# Patient Record
Sex: Female | Born: 1945
Health system: Southern US, Community
[De-identification: ages and names within clinical notes are randomized; demographics above are authoritative.]

## PROBLEM LIST (undated history)

## (undated) DIAGNOSIS — L409 Psoriasis, unspecified: Secondary | ICD-10-CM

## (undated) DIAGNOSIS — G2581 Restless legs syndrome: Secondary | ICD-10-CM

## (undated) DIAGNOSIS — M1990 Unspecified osteoarthritis, unspecified site: Secondary | ICD-10-CM

## (undated) DIAGNOSIS — F32A Depression, unspecified: Secondary | ICD-10-CM

## (undated) DIAGNOSIS — F419 Anxiety disorder, unspecified: Secondary | ICD-10-CM

## (undated) DIAGNOSIS — R51 Headache: Secondary | ICD-10-CM

## (undated) DIAGNOSIS — K5909 Other constipation: Secondary | ICD-10-CM

## (undated) DIAGNOSIS — H409 Unspecified glaucoma: Secondary | ICD-10-CM

## (undated) DIAGNOSIS — E785 Hyperlipidemia, unspecified: Secondary | ICD-10-CM

## (undated) DIAGNOSIS — Z5189 Encounter for other specified aftercare: Secondary | ICD-10-CM

## (undated) DIAGNOSIS — M8580 Other specified disorders of bone density and structure, unspecified site: Secondary | ICD-10-CM

## (undated) DIAGNOSIS — K279 Peptic ulcer, site unspecified, unspecified as acute or chronic, without hemorrhage or perforation: Principal | ICD-10-CM

## (undated) DIAGNOSIS — K219 Gastro-esophageal reflux disease without esophagitis: Secondary | ICD-10-CM

## (undated) DIAGNOSIS — M810 Age-related osteoporosis without current pathological fracture: Secondary | ICD-10-CM

## (undated) DIAGNOSIS — D649 Anemia, unspecified: Secondary | ICD-10-CM

## (undated) DIAGNOSIS — T7840XA Allergy, unspecified, initial encounter: Secondary | ICD-10-CM

## (undated) DIAGNOSIS — H269 Unspecified cataract: Secondary | ICD-10-CM

## (undated) DIAGNOSIS — F329 Major depressive disorder, single episode, unspecified: Secondary | ICD-10-CM

## (undated) HISTORY — DX: Other specified disorders of bone density and structure, unspecified site: M85.80

## (undated) HISTORY — DX: Psoriasis, unspecified: L40.9

## (undated) HISTORY — DX: Headache: R51

## (undated) HISTORY — DX: Unspecified glaucoma: H40.9

## (undated) HISTORY — DX: Other constipation: K59.09

## (undated) HISTORY — DX: Major depressive disorder, single episode, unspecified: F32.9

## (undated) HISTORY — DX: Allergy, unspecified, initial encounter: T78.40XA

## (undated) HISTORY — DX: Gastro-esophageal reflux disease without esophagitis: K21.9

## (undated) HISTORY — DX: Unspecified cataract: H26.9

## (undated) HISTORY — PX: EYE SURGERY: SHX253

## (undated) HISTORY — DX: Anxiety disorder, unspecified: F41.9

## (undated) HISTORY — DX: Restless legs syndrome: G25.81

## (undated) HISTORY — DX: Hyperlipidemia, unspecified: E78.5

## (undated) HISTORY — DX: Depression, unspecified: F32.A

## (undated) HISTORY — DX: Unspecified osteoarthritis, unspecified site: M19.90

## (undated) HISTORY — DX: Age-related osteoporosis without current pathological fracture: M81.0

## (undated) HISTORY — DX: Encounter for other specified aftercare: Z51.89

## (undated) HISTORY — PX: ANKLE FRACTURE SURGERY: SHX122

## (undated) HISTORY — PX: JOINT REPLACEMENT: SHX530

## (undated) HISTORY — DX: Anemia, unspecified: D64.9

## (undated) HISTORY — PX: FRACTURE SURGERY: SHX138

## (undated) HISTORY — PX: UPPER GASTROINTESTINAL ENDOSCOPY: SHX188

## (undated) HISTORY — DX: Peptic ulcer, site unspecified, unspecified as acute or chronic, without hemorrhage or perforation: K27.9

---

## 1997-10-30 ENCOUNTER — Other Ambulatory Visit: Admission: RE | Admit: 1997-10-30 | Discharge: 1997-10-30 | Payer: Self-pay | Admitting: Family Medicine

## 1998-03-02 ENCOUNTER — Encounter: Admission: RE | Admit: 1998-03-02 | Discharge: 1998-05-31 | Payer: Self-pay | Admitting: Family Medicine

## 1998-10-18 ENCOUNTER — Other Ambulatory Visit: Admission: RE | Admit: 1998-10-18 | Discharge: 1998-10-18 | Payer: Self-pay | Admitting: Family Medicine

## 1999-05-03 ENCOUNTER — Encounter: Payer: Self-pay | Admitting: Family Medicine

## 1999-05-03 ENCOUNTER — Encounter: Admission: RE | Admit: 1999-05-03 | Discharge: 1999-05-03 | Payer: Self-pay | Admitting: Family Medicine

## 2000-05-04 ENCOUNTER — Encounter: Payer: Self-pay | Admitting: Family Medicine

## 2000-05-04 ENCOUNTER — Encounter: Admission: RE | Admit: 2000-05-04 | Discharge: 2000-05-04 | Payer: Self-pay | Admitting: Family Medicine

## 2001-01-22 ENCOUNTER — Encounter: Payer: Self-pay | Admitting: Family Medicine

## 2001-01-22 ENCOUNTER — Encounter: Admission: RE | Admit: 2001-01-22 | Discharge: 2001-01-22 | Payer: Self-pay | Admitting: Family Medicine

## 2001-05-09 ENCOUNTER — Encounter: Payer: Self-pay | Admitting: Family Medicine

## 2001-05-09 ENCOUNTER — Encounter: Admission: RE | Admit: 2001-05-09 | Discharge: 2001-05-09 | Payer: Self-pay | Admitting: Family Medicine

## 2001-09-18 ENCOUNTER — Encounter: Admission: RE | Admit: 2001-09-18 | Discharge: 2001-09-18 | Payer: Self-pay | Admitting: Family Medicine

## 2001-09-18 ENCOUNTER — Encounter: Payer: Self-pay | Admitting: Family Medicine

## 2002-05-14 ENCOUNTER — Encounter: Admission: RE | Admit: 2002-05-14 | Discharge: 2002-05-14 | Payer: Self-pay | Admitting: Family Medicine

## 2002-05-14 ENCOUNTER — Encounter: Payer: Self-pay | Admitting: Family Medicine

## 2002-06-19 ENCOUNTER — Encounter: Payer: Self-pay | Admitting: Family Medicine

## 2002-06-19 ENCOUNTER — Encounter: Admission: RE | Admit: 2002-06-19 | Discharge: 2002-06-19 | Payer: Self-pay | Admitting: Family Medicine

## 2003-02-09 ENCOUNTER — Ambulatory Visit (HOSPITAL_COMMUNITY): Admission: RE | Admit: 2003-02-09 | Discharge: 2003-02-09 | Payer: Self-pay | Admitting: Gastroenterology

## 2003-02-11 ENCOUNTER — Encounter: Admission: RE | Admit: 2003-02-11 | Discharge: 2003-02-11 | Payer: Self-pay | Admitting: Family Medicine

## 2003-02-11 ENCOUNTER — Encounter: Payer: Self-pay | Admitting: Family Medicine

## 2003-02-11 ENCOUNTER — Encounter: Payer: Self-pay | Admitting: Internal Medicine

## 2003-03-04 ENCOUNTER — Encounter: Payer: Self-pay | Admitting: Family Medicine

## 2003-03-04 ENCOUNTER — Encounter: Admission: RE | Admit: 2003-03-04 | Discharge: 2003-03-04 | Payer: Self-pay | Admitting: Family Medicine

## 2003-05-28 ENCOUNTER — Encounter: Admission: RE | Admit: 2003-05-28 | Discharge: 2003-05-28 | Payer: Self-pay | Admitting: Family Medicine

## 2004-07-22 ENCOUNTER — Encounter: Admission: RE | Admit: 2004-07-22 | Discharge: 2004-07-22 | Payer: Self-pay | Admitting: Family Medicine

## 2005-08-03 ENCOUNTER — Encounter: Admission: RE | Admit: 2005-08-03 | Discharge: 2005-08-03 | Payer: Self-pay | Admitting: Family Medicine

## 2006-01-10 ENCOUNTER — Ambulatory Visit: Payer: Self-pay | Admitting: Family Medicine

## 2006-04-20 ENCOUNTER — Ambulatory Visit: Payer: Self-pay | Admitting: Family Medicine

## 2006-04-20 LAB — CONVERTED CEMR LAB
Cholesterol: 250 mg/dL (ref 0–200)
Hemoglobin: 13.2 g/dL (ref 12.0–15.0)
MCHC: 34.1 g/dL (ref 30.0–36.0)
MCV: 93.5 fL (ref 78.0–100.0)
Platelets: 313 10*3/uL (ref 150–400)
RBC: 4.14 M/uL (ref 3.87–5.11)
TSH: 1.5 microintl units/mL (ref 0.35–5.50)
VLDL: 11 mg/dL (ref 0–40)

## 2006-04-25 ENCOUNTER — Encounter: Admission: RE | Admit: 2006-04-25 | Discharge: 2006-04-25 | Payer: Self-pay | Admitting: Family Medicine

## 2006-07-03 ENCOUNTER — Ambulatory Visit: Payer: Self-pay | Admitting: Internal Medicine

## 2006-08-17 DIAGNOSIS — F419 Anxiety disorder, unspecified: Secondary | ICD-10-CM | POA: Insufficient documentation

## 2006-08-17 DIAGNOSIS — J309 Allergic rhinitis, unspecified: Secondary | ICD-10-CM | POA: Insufficient documentation

## 2006-08-17 DIAGNOSIS — F329 Major depressive disorder, single episode, unspecified: Secondary | ICD-10-CM

## 2006-08-17 DIAGNOSIS — M810 Age-related osteoporosis without current pathological fracture: Secondary | ICD-10-CM

## 2006-08-23 ENCOUNTER — Encounter: Admission: RE | Admit: 2006-08-23 | Discharge: 2006-08-23 | Payer: Self-pay | Admitting: Family Medicine

## 2006-09-03 ENCOUNTER — Ambulatory Visit: Payer: Self-pay | Admitting: Family Medicine

## 2006-09-03 LAB — CONVERTED CEMR LAB
Total CHOL/HDL Ratio: 3.6
Triglycerides: 82 mg/dL (ref 0–149)

## 2006-09-04 ENCOUNTER — Ambulatory Visit: Payer: Self-pay | Admitting: Family Medicine

## 2007-01-11 ENCOUNTER — Telehealth (INDEPENDENT_AMBULATORY_CARE_PROVIDER_SITE_OTHER): Payer: Self-pay | Admitting: *Deleted

## 2007-02-01 ENCOUNTER — Ambulatory Visit: Payer: Self-pay | Admitting: Family Medicine

## 2007-02-01 DIAGNOSIS — K5909 Other constipation: Secondary | ICD-10-CM | POA: Insufficient documentation

## 2007-02-01 LAB — CONVERTED CEMR LAB
ALT: 19 units/L (ref 0–35)
AST: 25 units/L (ref 0–37)
Albumin: 4.1 g/dL (ref 3.5–5.2)
Alkaline Phosphatase: 63 units/L (ref 39–117)
Basophils Absolute: 0.1 10*3/uL (ref 0.0–0.1)
Calcium: 9.9 mg/dL (ref 8.4–10.5)
Chloride: 106 meq/L (ref 96–112)
Eosinophils Absolute: 0.1 10*3/uL (ref 0.0–0.6)
Eosinophils Relative: 3.3 % (ref 0.0–5.0)
GFR calc non Af Amer: 77 mL/min
Glucose, Bld: 97 mg/dL (ref 70–99)
MCV: 92.2 fL (ref 78.0–100.0)
Platelets: 304 10*3/uL (ref 150–400)
RBC: 4.08 M/uL (ref 3.87–5.11)
WBC: 4.3 10*3/uL — ABNORMAL LOW (ref 4.5–10.5)

## 2007-02-04 ENCOUNTER — Encounter: Payer: Self-pay | Admitting: Family Medicine

## 2007-02-04 ENCOUNTER — Encounter (INDEPENDENT_AMBULATORY_CARE_PROVIDER_SITE_OTHER): Payer: Self-pay | Admitting: Family Medicine

## 2007-02-04 ENCOUNTER — Ambulatory Visit: Payer: Self-pay | Admitting: Family Medicine

## 2007-02-25 ENCOUNTER — Telehealth (INDEPENDENT_AMBULATORY_CARE_PROVIDER_SITE_OTHER): Payer: Self-pay | Admitting: *Deleted

## 2007-03-05 ENCOUNTER — Ambulatory Visit: Payer: Self-pay | Admitting: Family Medicine

## 2007-03-10 LAB — CONVERTED CEMR LAB
Basophils Absolute: 0.1 10*3/uL (ref 0.0–0.1)
Eosinophils Absolute: 0.2 10*3/uL (ref 0.0–0.6)
HCT: 36 % (ref 36.0–46.0)
Hemoglobin: 12.6 g/dL (ref 12.0–15.0)
Lymphocytes Relative: 20.7 % (ref 12.0–46.0)
MCHC: 35 g/dL (ref 30.0–36.0)
MCV: 91.9 fL (ref 78.0–100.0)
Monocytes Absolute: 0.6 10*3/uL (ref 0.2–0.7)
Neutrophils Relative %: 62.3 % (ref 43.0–77.0)

## 2007-03-11 ENCOUNTER — Telehealth (INDEPENDENT_AMBULATORY_CARE_PROVIDER_SITE_OTHER): Payer: Self-pay | Admitting: *Deleted

## 2007-03-11 ENCOUNTER — Encounter (INDEPENDENT_AMBULATORY_CARE_PROVIDER_SITE_OTHER): Payer: Self-pay | Admitting: *Deleted

## 2007-04-25 ENCOUNTER — Ambulatory Visit: Payer: Self-pay | Admitting: Family Medicine

## 2007-05-30 ENCOUNTER — Ambulatory Visit: Payer: Self-pay | Admitting: Family Medicine

## 2007-09-11 ENCOUNTER — Encounter: Admission: RE | Admit: 2007-09-11 | Discharge: 2007-09-11 | Payer: Self-pay | Admitting: Internal Medicine

## 2007-09-16 ENCOUNTER — Encounter: Payer: Self-pay | Admitting: Internal Medicine

## 2007-09-16 ENCOUNTER — Encounter (INDEPENDENT_AMBULATORY_CARE_PROVIDER_SITE_OTHER): Payer: Self-pay | Admitting: *Deleted

## 2007-12-06 ENCOUNTER — Ambulatory Visit: Payer: Self-pay | Admitting: Internal Medicine

## 2008-02-26 ENCOUNTER — Ambulatory Visit: Payer: Self-pay | Admitting: Internal Medicine

## 2008-03-31 ENCOUNTER — Other Ambulatory Visit: Admission: RE | Admit: 2008-03-31 | Discharge: 2008-03-31 | Payer: Self-pay | Admitting: Internal Medicine

## 2008-03-31 ENCOUNTER — Ambulatory Visit: Payer: Self-pay | Admitting: Internal Medicine

## 2008-03-31 ENCOUNTER — Encounter: Payer: Self-pay | Admitting: Internal Medicine

## 2008-03-31 LAB — CONVERTED CEMR LAB: Vit D, 1,25-Dihydroxy: 39 (ref 30–89)

## 2008-04-02 ENCOUNTER — Encounter (INDEPENDENT_AMBULATORY_CARE_PROVIDER_SITE_OTHER): Payer: Self-pay | Admitting: *Deleted

## 2008-04-02 ENCOUNTER — Telehealth (INDEPENDENT_AMBULATORY_CARE_PROVIDER_SITE_OTHER): Payer: Self-pay | Admitting: *Deleted

## 2008-04-02 LAB — CONVERTED CEMR LAB
ALT: 18 U/L
AST: 25 U/L
BUN: 11 mg/dL
Basophils Absolute: 0 K/uL
Basophils Relative: 1.2 %
CO2: 30 meq/L
Calcium: 9.5 mg/dL
Chloride: 104 meq/L
Cholesterol: 270 mg/dL
Creatinine, Ser: 0.9 mg/dL
Direct LDL: 161.9 mg/dL
Eosinophils Absolute: 0.2 K/uL
Eosinophils Relative: 4.7 %
GFR calc Af Amer: 81 mL/min
GFR calc non Af Amer: 67 mL/min
Glucose, Bld: 73 mg/dL
HCT: 38.7 %
HDL: 80.3 mg/dL
Hemoglobin: 13.2 g/dL
Lymphocytes Relative: 21.6 %
MCHC: 34.3 g/dL
MCV: 95.6 fL
Monocytes Absolute: 0.5 K/uL
Monocytes Relative: 11.7 %
Neutro Abs: 2.4 K/uL
Neutrophils Relative %: 60.8 %
Platelets: 267 K/uL
Potassium: 3.7 meq/L
RBC: 4.04 M/uL
RDW: 12.6 %
Sodium: 141 meq/L
TSH: 1.85 u[IU]/mL
Total CHOL/HDL Ratio: 3.4
Triglycerides: 63 mg/dL
VLDL: 13 mg/dL
WBC: 4 10*3/microliter — ABNORMAL LOW

## 2008-04-06 ENCOUNTER — Encounter (INDEPENDENT_AMBULATORY_CARE_PROVIDER_SITE_OTHER): Payer: Self-pay | Admitting: *Deleted

## 2008-04-13 ENCOUNTER — Encounter: Payer: Self-pay | Admitting: Internal Medicine

## 2008-04-13 ENCOUNTER — Ambulatory Visit: Payer: Self-pay | Admitting: Internal Medicine

## 2008-04-30 ENCOUNTER — Telehealth (INDEPENDENT_AMBULATORY_CARE_PROVIDER_SITE_OTHER): Payer: Self-pay | Admitting: *Deleted

## 2008-05-20 ENCOUNTER — Encounter: Payer: Self-pay | Admitting: Internal Medicine

## 2008-05-20 ENCOUNTER — Encounter: Admission: RE | Admit: 2008-05-20 | Discharge: 2008-05-20 | Payer: Self-pay | Admitting: Internal Medicine

## 2008-07-16 ENCOUNTER — Ambulatory Visit: Payer: Self-pay | Admitting: Internal Medicine

## 2008-07-16 ENCOUNTER — Encounter: Payer: Self-pay | Admitting: Internal Medicine

## 2008-08-04 ENCOUNTER — Telehealth (INDEPENDENT_AMBULATORY_CARE_PROVIDER_SITE_OTHER): Payer: Self-pay | Admitting: *Deleted

## 2008-09-16 ENCOUNTER — Encounter: Admission: RE | Admit: 2008-09-16 | Discharge: 2008-09-16 | Payer: Self-pay | Admitting: Internal Medicine

## 2009-03-05 ENCOUNTER — Encounter: Payer: Self-pay | Admitting: Internal Medicine

## 2009-04-02 ENCOUNTER — Ambulatory Visit: Payer: Self-pay | Admitting: Family

## 2009-05-28 ENCOUNTER — Encounter: Payer: Self-pay | Admitting: Internal Medicine

## 2009-06-25 ENCOUNTER — Encounter: Payer: Self-pay | Admitting: Family

## 2009-06-28 ENCOUNTER — Ambulatory Visit: Payer: Self-pay | Admitting: Cardiology

## 2009-06-28 ENCOUNTER — Ambulatory Visit: Payer: Self-pay | Admitting: Family

## 2009-06-28 DIAGNOSIS — G43909 Migraine, unspecified, not intractable, without status migrainosus: Secondary | ICD-10-CM

## 2009-06-28 LAB — CONVERTED CEMR LAB: Sed Rate: 12 mm/hr (ref 0–22)

## 2009-06-29 ENCOUNTER — Encounter: Payer: Self-pay | Admitting: Family

## 2009-09-15 ENCOUNTER — Ambulatory Visit: Payer: Self-pay | Admitting: Family Medicine

## 2009-09-22 ENCOUNTER — Telehealth (INDEPENDENT_AMBULATORY_CARE_PROVIDER_SITE_OTHER): Payer: Self-pay | Admitting: *Deleted

## 2009-09-22 ENCOUNTER — Encounter: Admission: RE | Admit: 2009-09-22 | Discharge: 2009-09-22 | Payer: Self-pay | Admitting: Internal Medicine

## 2009-09-22 LAB — HM MAMMOGRAPHY: HM Mammogram: NEGATIVE

## 2009-09-23 ENCOUNTER — Ambulatory Visit: Payer: Self-pay | Admitting: Family Medicine

## 2009-09-24 ENCOUNTER — Telehealth (INDEPENDENT_AMBULATORY_CARE_PROVIDER_SITE_OTHER): Payer: Self-pay | Admitting: *Deleted

## 2009-09-28 ENCOUNTER — Encounter (INDEPENDENT_AMBULATORY_CARE_PROVIDER_SITE_OTHER): Payer: Self-pay | Admitting: *Deleted

## 2009-11-09 ENCOUNTER — Other Ambulatory Visit: Admission: RE | Admit: 2009-11-09 | Discharge: 2009-11-09 | Payer: Self-pay | Admitting: Internal Medicine

## 2009-11-09 ENCOUNTER — Ambulatory Visit: Payer: Self-pay | Admitting: Internal Medicine

## 2009-11-09 DIAGNOSIS — E785 Hyperlipidemia, unspecified: Secondary | ICD-10-CM

## 2009-11-09 LAB — CONVERTED CEMR LAB: Vit D, 25-Hydroxy: 52 ng/mL (ref 30–89)

## 2009-11-11 LAB — CONVERTED CEMR LAB
Basophils Relative: 1.4 % (ref 0.0–3.0)
Calcium: 9.5 mg/dL (ref 8.4–10.5)
Direct LDL: 150.9 mg/dL
Eosinophils Relative: 4.2 % (ref 0.0–5.0)
GFR calc non Af Amer: 84.94 mL/min (ref >60)
Glucose, Bld: 70 mg/dL (ref 70–99)
HCT: 37.6 % (ref 36.0–46.0)
HDL: 70.3 mg/dL (ref >39.00)
Hemoglobin: 12.9 g/dL (ref 12.0–15.0)
Lymphocytes Relative: 25.6 % (ref 12.0–46.0)
Lymphs Abs: 1.1 10*3/uL (ref 0.7–4.0)
Monocytes Relative: 12.5 % — ABNORMAL HIGH (ref 3.0–12.0)
Neutro Abs: 2.5 10*3/uL (ref 1.4–7.7)
Potassium: 4.6 meq/L (ref 3.5–5.1)
RBC: 3.95 M/uL (ref 3.87–5.11)
Sodium: 144 meq/L (ref 135–145)
Total CHOL/HDL Ratio: 4
VLDL: 26.4 mg/dL (ref 0.0–40.0)
WBC: 4.4 10*3/uL — ABNORMAL LOW (ref 4.5–10.5)

## 2009-12-13 ENCOUNTER — Ambulatory Visit: Payer: Self-pay | Admitting: Internal Medicine

## 2009-12-14 ENCOUNTER — Ambulatory Visit: Payer: Self-pay | Admitting: Internal Medicine

## 2009-12-15 ENCOUNTER — Telehealth: Payer: Self-pay | Admitting: Internal Medicine

## 2009-12-17 ENCOUNTER — Telehealth (INDEPENDENT_AMBULATORY_CARE_PROVIDER_SITE_OTHER): Payer: Self-pay | Admitting: *Deleted

## 2009-12-21 ENCOUNTER — Encounter: Payer: Self-pay | Admitting: Internal Medicine

## 2009-12-24 ENCOUNTER — Emergency Department (HOSPITAL_COMMUNITY): Admission: EM | Admit: 2009-12-24 | Discharge: 2009-12-24 | Payer: Self-pay | Admitting: Emergency Medicine

## 2009-12-24 ENCOUNTER — Telehealth: Payer: Self-pay | Admitting: Internal Medicine

## 2010-01-07 ENCOUNTER — Encounter: Payer: Self-pay | Admitting: Internal Medicine

## 2010-01-13 ENCOUNTER — Telehealth: Payer: Self-pay | Admitting: Internal Medicine

## 2010-01-21 ENCOUNTER — Encounter: Payer: Self-pay | Admitting: Internal Medicine

## 2010-01-28 ENCOUNTER — Ambulatory Visit: Payer: Self-pay | Admitting: Internal Medicine

## 2010-04-09 ENCOUNTER — Emergency Department (HOSPITAL_COMMUNITY): Admission: EM | Admit: 2010-04-09 | Discharge: 2010-04-09 | Payer: Self-pay | Admitting: Family Medicine

## 2010-04-11 ENCOUNTER — Encounter: Payer: Self-pay | Admitting: Internal Medicine

## 2010-04-29 ENCOUNTER — Ambulatory Visit: Payer: Self-pay | Admitting: Internal Medicine

## 2010-05-15 DIAGNOSIS — K279 Peptic ulcer, site unspecified, unspecified as acute or chronic, without hemorrhage or perforation: Secondary | ICD-10-CM

## 2010-05-15 HISTORY — DX: Peptic ulcer, site unspecified, unspecified as acute or chronic, without hemorrhage or perforation: K27.9

## 2010-05-20 ENCOUNTER — Encounter
Admission: RE | Admit: 2010-05-20 | Discharge: 2010-06-14 | Payer: Self-pay | Source: Home / Self Care | Attending: Orthopedic Surgery | Admitting: Orthopedic Surgery

## 2010-05-30 ENCOUNTER — Encounter
Admission: RE | Admit: 2010-05-30 | Discharge: 2010-05-30 | Payer: Self-pay | Source: Home / Self Care | Attending: Orthopedic Surgery | Admitting: Orthopedic Surgery

## 2010-06-02 ENCOUNTER — Telehealth: Payer: Self-pay | Admitting: Internal Medicine

## 2010-06-02 ENCOUNTER — Encounter: Payer: Self-pay | Admitting: Internal Medicine

## 2010-06-02 ENCOUNTER — Ambulatory Visit
Admission: RE | Admit: 2010-06-02 | Discharge: 2010-06-02 | Payer: Self-pay | Source: Home / Self Care | Attending: Internal Medicine | Admitting: Internal Medicine

## 2010-06-02 DIAGNOSIS — M1990 Unspecified osteoarthritis, unspecified site: Secondary | ICD-10-CM

## 2010-06-02 HISTORY — DX: Unspecified osteoarthritis, unspecified site: M19.90

## 2010-06-05 ENCOUNTER — Encounter: Payer: Self-pay | Admitting: Family Medicine

## 2010-06-14 NOTE — Letter (Signed)
   [facility] [address] [place], [place] [address] [phone]    June 29, 2009   [facility] [address] [place], [place] [address]  RE:  LAB RESULTS  Dear  Ms. Tsang,  The following is an interpretation of your most recent lab tests.  Please take note of any instructions provided or changes to medications that have resulted from your lab work.     Your sed rate is normal.     Sincerely Yours,    Lemont Fillers FNP

## 2010-06-14 NOTE — Consult Note (Signed)
Summary: [facility]  [facility]   Imported By: Sherian Rein 01/21/2010 07:27:36  _____________________________________________________________________  External Attachment:    Type:   Image     Comment:   External Document

## 2010-06-14 NOTE — Letter (Signed)
Summary: pain from L5 root compression, had a MRI--local shot vs surgery  [facility]   Imported By: Sherian Rein 01/21/2010 07:28:32  _____________________________________________________________________  External Attachment:    Type:   Image     Comment:   External Document

## 2010-06-14 NOTE — Assessment & Plan Note (Signed)
Summary: spartic h/a's//lch   Vital Signs:  Patient profile:   65 year old female Menstrual status:  postmenopausal Weight:      157 pounds Temp:     98.5 degrees F oral Pulse rate:   68 / minute BP sitting:   120 / 76  (left arm)  Vitals Entered By: Doristine Devoid (June 28, 2009 10:59 AM) CC: intermittent HA on and off xmonths now becoming more frequent , Headache Comments -has f/u w/ eye Dr. 06/29/09   CC:  intermittent HA on and off xmonths now becoming more frequent  and Headache.  History of Present Illness: [name] is a 65 year old female ho presents today with c/o HA.  Notes that the pain is sharp.  Generally noted behind her right eye or over her left ear.  Symptoms present on either one side or the other.  Initially she was having HA once a week.  In the last one week she has been having > 1 episode per day.  Notes that HA will last approximately 1 minute, and few minutes later she will have another HA.  Might have 3-4 like this in sequence.  May happen again later in the day.  This is sometimes accompanied by nausea, but no vomitting.  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  She denies associated photophobia or phonophobia.  She has taken ibuprofen for neck pain.  This helps her symptoms of neck pain temporarily but does not help her headache. She does note that she had fall 10/13 and struck head on side walk following a mechanical fall. She did not seek medical attention following this fall.  Patient notes that headaches are brief, but are when they occur, the worst headaches of her life.  Denies history of migraine headaches. Denies associated visual changes.  Denies fever but did have some recent myalgias.    Allergies: 1)  ! Sulfa  Review of Systems       occasional muscle pain, notes mild tenderness overlying temples  Physical Exam  General:  Well-developed,well-nourished,in no acute distress; alert,appropriate and  cooperative throughout examination Head:  Normocephalic and atraumatic without obvious abnormalities. No apparent alopecia or balding. mild tenderness over bilateral temporal region.   Eyes:  PERRLA Neck:  No deformities, masses, or tenderness noted. Lungs:  Normal respiratory effort, chest expands symmetrically. Lungs are clear to auscultation, no crackles or wheezes. Heart:  Normal rate and regular rhythm. S1 and S2 normal without gallop, murmur, click, rub or other extra sounds. Neurologic:  alert & oriented X3, cranial nerves II-XII intact, and strength normal in all extremities.  EOM intact   Impression & Recommendations:  Problem # 1:  HEADACHE (ICD-784.0) Assessment New Given history of fall need to do head CT to rule out intracranial bleed.  Will also check sed rate due to temporal tenderness and recent report of myalgias.   Orders: Venipuncture (32440) TLB-Sedimentation Rate (ESR) (85652-ESR) Misc. Referral (Misc. Ref)  Complete Medication List: 1)  Celexa 20 Mg Tabs (Citalopram hydrobromide) .Marland Kitchen.. 1 by mouth qd 2)  Budeprion Xl 300 Mg Tb24 (Bupropion hcl) .... Take one tablet daily 3)  Zyrtec Hives Relief 10 Mg Tabs (Cetirizine hcl) 4)  Coenzyme Q10 100 Mg Caps (Coenzyme q10) .... Take one tablet daily 5)  B Complex  6)  Preservision Areds Caps (Multiple vitamins-minerals) 7)  Vitamin D-400 400 Unit Tabs (Cholecalciferol) .Marland Kitchen.. 1 tab by mouth daily 8)  Calcium 500 Mg Tabs (Calcium) .Marland KitchenMarland KitchenMarland Kitchen  2 tablets daily  Patient Instructions: 1)  Please complete your lab work and your  CT today. 2)  Please schedule a follow-up appointment in 2 weeks.

## 2010-06-14 NOTE — Assessment & Plan Note (Signed)
Summary: rash - itching/cbs   Vital Signs:  Patient profile:   65 year old female Menstrual status:  postmenopausal Height:      65.5 inches Weight:      157.38 pounds BMI:     25.88 Temp:     98.6 degrees F oral Pulse rate:   60 / minute  Vitals Entered By: Kandice Hams (Sep 15, 2009 11:29 AM) CC: c/o rash red itching both  arms and hands, pulling weeds in yard yesterday, poison oak?   History of Present Illness: 65 yo woman here today for itchy rash.  was weeding the yard on Sun/Mon and now has rash spreading up both forearms.  has hx of severe poison in the past requiring 'a shot and 2 pred packs'.  no fever, chills, people in home w/ similar.  Current Medications (verified): 1)  Celexa 20 Mg Tabs (Citalopram Hydrobromide) .Marland Kitchen.. 1 By Mouth Qd 2)  Budeprion Xl 300 Mg  Tb24 (Bupropion Hcl) .... Take One Tablet Daily 3)  Zyrtec Hives Relief 10 Mg  Tabs (Cetirizine Hcl) 4)  Coenzyme Q10 100 Mg Caps (Coenzyme Q10) .... Take One Tablet Daily 5)  B Complex 6)  Preservision Areds   Caps (Multiple Vitamins-Minerals) 7)  Vitamin D-400 400 Unit Tabs (Cholecalciferol) .Marland Kitchen.. 1 Tab By Mouth Daily 8)  Calcium 500 Mg Tabs (Calcium) .... 2 Tablets Daily 9)  Prednisone (Pak) 10 Mg Tabs (Prednisone) .... Take As Directed  Allergies (verified): 1)  ! Sulfa  Review of Systems      See HPI  Physical Exam  General:  Well-developed,well-nourished,in no acute distress; alert,appropriate and cooperative throughout examination Skin:  erythematous vesicular rash in clusters on forearms bilaterally.  no signs of infxn.   Impression & Recommendations:  Problem # 1:  RASH AND OTHER NONSPECIFIC SKIN ERUPTION (ICD-782.1) Assessment New pt reports hx of similar contact dermatitis.  steroid injxn given and script for predpack.  reviewed supportive care and red flags that should prompt return.  Pt expresses understanding and is in agreement w/ this plan. Orders: Depo- Medrol 80mg  (J1040) Admin of  Therapeutic Inj  intramuscular or subcutaneous (04540) Prescription Created Electronically [phone])  Complete Medication List: 1)  Celexa 20 Mg Tabs (Citalopram hydrobromide) .Marland Kitchen.. 1 by mouth qd 2)  Budeprion Xl 300 Mg Tb24 (Bupropion hcl) .... Take one tablet daily 3)  Zyrtec Hives Relief 10 Mg Tabs (Cetirizine hcl) 4)  Coenzyme Q10 100 Mg Caps (Coenzyme q10) .... Take one tablet daily 5)  B Complex  6)  Preservision Areds Caps (Multiple vitamins-minerals) 7)  Vitamin D-400 400 Unit Tabs (Cholecalciferol) .Marland Kitchen.. 1 tab by mouth daily 8)  Calcium 500 Mg Tabs (Calcium) .... 2 tablets daily 9)  Prednisone (pak) 10 Mg Tabs (Prednisone) .... Take as directed  Patient Instructions: 1)  Start the PredPack tomorrow 2)  Use Benadryl cream or Gold-Bond anti-itch creams as needed 3)  Oral Benadryl as needed for itching 4)  Wash all things that have come in contact with the plant oil 5)  Hang in there!!! Prescriptions: PREDNISONE (PAK) 10 MG TABS (PREDNISONE) take as directed  #1 pack x 0   Entered and Authorized by:   Neena Rhymes MD   Signed by:   Neena Rhymes MD on 09/15/2009   Method used:   Electronically to        [facility] [phone]* (retail)       [address]       [place], [place]  [address]       Ph: [phone]       Fax: [phone]   RxID:   [id number]    Medication Administration  Injection # 1:    Medication: Depo- Medrol 80mg     Diagnosis: RASH AND OTHER NONSPECIFIC SKIN ERUPTION (ICD-782.1)    Route: IM    Site: LUOQ gluteus    Exp Date: 03/22/2010    Lot #: [id number]    Mfr: Pharmacia    Patient tolerated injection without complications    Given by: Kandice Hams (Sep 15, 2009 12:03 PM)  Orders Added: 1)  Depo- Medrol 80mg  [J1040] 2)  Admin of Therapeutic Inj  intramuscular or subcutaneous [96372] 3)  Est. Patient Level III [03474] 4)  Prescription Created Electronically [phone]

## 2010-06-14 NOTE — Letter (Signed)
Summary: Results Follow up Letter  [facility]  [address] [place], [place] [address]   Phone: [phone]  Fax: [phone]    04/06/2008 MRN: [id number]  [facility] [address] [place], [place]  [address]  Dear Ms. Woehl,  The following are the results of your recent test(s):  Test         Result    Pap Smear:        Normal __x___  Not Normal _____ Comments: ___________next pap due in 1 year __________________________________________ Cholesterol: LDL(Bad cholesterol):         Your goal is less than:         HDL (Good cholesterol):       Your goal is more than: Comments:  ______________________________________________________ Mammogram:        Normal _____  Not Normal _____ Comments:  ___________________________________________________________________ Hemoccult:        Normal _____  Not normal _______ Comments:    _____________________________________________________________________ Other Tests:    We routinely do not discuss normal results over the telephone.  If you desire a copy of the results, or you have any questions about this information we can discuss them at your next office visit.   Sincerely,

## 2010-06-14 NOTE — Assessment & Plan Note (Signed)
Summary: CPX, WANTS PAP, FASTING, MEDICARE & BCBS/RH......   Vital Signs:  Patient profile:   65 year old female Menstrual status:  postmenopausal Height:      66 inches Weight:      156.13 pounds Temp:     98.7 degrees F oral Pulse rate:   76 / minute Pulse rhythm:   regular BP sitting:   118 / 78  (left arm) Cuff size:   regular  Vitals Entered By: Army Fossa CMA (November 09, 2009 9:19 AM) CC: CPX: Fasting, pap Comments - Having Headaches in the am - Had poison oak still has spots that are broken out. -Cluster of bumps under her left breast. -Seeing a therapist.    History of Present Illness:  Here for Medicare AWV:  1.Risk factors based on Past M, S, F history: reviewed 2.Physical Activities: inactive  3.Depression/mood:  see below, seing a therapist  4.Hearing: no problems , normal hearing to whispered voice  5.ADL's: totally independent 6.Fall Risk: low risk, no recent falls 7.Home Safety: has Conservation officer, nature, home is safe  8.Height, weight, &visual acuity:visiuon corrected, see VS. Early glaucoma? 9. Counseling:  yes, regards life style and depression  10. Labs ordered based on risk factors:  yes 11. Referral Coordination --yes 12. Care Plan-- discussed w/ patient plan ofcare for the next 12 months  13.    Cognitive Assessment-- normal memory short and long,   in addition to her physical we assessed the following problems -- still having Headaches in the am: symptoms are almost daily, sometimes associated with nausea, resolve spontaneously, they are usually one-sided (L>R) no photophobia - Had poison oak still has spots that are broken out.she has gotten prednisone twice, pulsatile symptoms better but he still has some spots. -Cluster of bumps under her left breast for several months, no itching, occasionally hurt - depression, sx not completely well, seeing a therapist, started w/ him 3-11, improved? -Osteoporosis, bone density test reviewed and discussed with the  patient    Allergies: 1)  ! Sulfa  Past History:  Past Medical History: Allergic rhinitis Depression Osteoporosis Constipation, chronic HAs chest x-ray 2008 showed  "emphysema", PFTs 2009 normal  Past Surgical History: Reviewed history from 08/17/2006 and no changes required. Caesarean section  Family History: Reviewed history from 12/06/2007 and no changes required. CAD - no stroke - M brain aneurysm - M DM - GF HTN - no colon Ca - no breast Ca - no ovarian/uterine Ca - no F family hx unknown  Social History: Married, household patient and her husband  1 child 2 step children 7 grandkids retired VP of  [employer]-- never ETOH-- socially   Review of Systems CV:  Denies chest pain or discomfort and swelling of feet. Resp:  Denies cough and shortness of breath. GI:  Denies bloody stools, diarrhea, nausea, and vomiting; (+) chronic constipation. GU:  Denies dysuria and hematuria; no  SBE some stress incontinence . Psych:  (-) suicidal sleeps well .  Physical Exam  General:  alert, well-developed, and well-nourished.   Neck:  no masses, no thyromegaly, and normal carotid upstroke.   Breasts:  No mass, nodules, thickening, tenderness, bulging, retraction, inflamation, nipple discharge or skin changes noted.  no axillary lymphadenopathy  Lungs:  normal respiratory effort, no intercostal retractions, no accessory muscle use, and normal breath sounds.   Heart:  normal rate, regular rhythm, no murmur, and no gallop.   Abdomen:  soft, non-tender, no distention, no masses, no guarding, and no  rigidity.   Genitalia:  no external lesions.   (+) vaginal dryness and atresia  very hard to examined the vaginal, cervix poorly visualized but no lesions or discharge Bimanual exam limited, adnexa without mass Neurologic:  alert & oriented X3, strength normal in all extremities, and gait normal.   Skin:  few red, blistery spots in the arms ( what  patient thinks is  poison ivy) under the left breast there is 1/2 cm lesion, slightly dark and  scaly Psych:  Oriented X3, memory intact for recent and remote, normally interactive, good eye contact, not anxious appearing, and not depressed appearing.  slightly flat affect   Impression & Recommendations:  Problem # 1:  HEALTH SCREENING (ICD-V70.0)  Td 2001 and today shingles shot 2009 pneumonia shot-- today  Colonoscopy: 05/15/2002, Next Due:  05/2012 (per chart review, no scope report found; patient told Cscope  was noraml)   PAP 11-09 neg  , repeated Pap today MMG 5-11 (-)  , breast exam today  she definitely has vaginal dryness, she's not sexually active, drynes  does not bother her. Some urinary incontinence with cough. Discuss with her possible use of Premarin cream, we agree to try a low dose, aware needs yearly pap and MMGs  Orders: First annual wellness visit with prevention plan  (Z6109) Pelvic & Breast Exam ( Medicare)  (G0101)  Problem # 2:  OSTEOPOROSIS (ICD-733.00) last bone density test 3/10  compared to the scanned  bone density test from 2004: lumbar  T score has definitely  improved hips Tscore has improved slightly base on results from 3-10,she stoped biphosphonates (holyday) continue ca and vit d    Her updated medication list for this problem includes:    Vitamin D-400 400 Unit Tabs (Cholecalciferol) .Marland Kitchen... 1 tab by mouth daily    Calcium 500 Mg Tabs (Calcium) .Marland Kitchen... 2 tablets daily  Orders: TLB-TSH (Thyroid Stimulating Hormone) (84443-TSH) T-Vitamin D (25-Hydroxy) [id number])  Problem # 3:  RASH AND OTHER NONSPECIFIC SKIN ERUPTION (ICD-782.1) she has come back dermatitis, this is gone on since May 2011, status post 2 rounds of steroids by mouth. She still has some fresh lesions Plan: hydrocortisone cream 2.5% Avoidance discussed    Her updated medication list for this problem includes:    Hydrocortisone 2.5 % Crea (Hydrocortisone) .Marland Kitchen... Apply two times a day x 10  days  Problem # 4:  HEADACHE (ICD-784.0)  sounds like a migraine headache symptoms are daily -- refer to neuro  Orders: Neurology Referral (Neuro)  Problem # 5:  DEPRESSION (ICD-311) note well controlled on Celexa since 2008 On  bupropion for even longer  Plan: will RTC in 3 months, if no better consider change meds  continue seing therapist  Her updated medication list for this problem includes:    Celexa 20 Mg Tabs (Citalopram hydrobromide) .Marland Kitchen... 1 by mouth qd    Budeprion Xl 300 Mg Tb24 (Bupropion hcl) .Marland Kitchen... Take one tablet daily  Problem # 6:  ? of SEBORRHEIC KERATOSIS (ICD-702.19)  Lesion under the left breast likely seborrheic keratosis Recommend  derm  referral for excision, area  is irritated by the bra also has a lesion in the R arm Refer to derm   Orders: Dermatology Referral (Derma)  Problem # 7:  HYPERLIPIDEMIA (ICD-272.4) moderate hyperlipidemia, diet and exercise discussed Orders: Venipuncture (91478) TLB-BMP (Basic Metabolic Panel-BMET) (80048-METABOL) TLB-CBC Platelet - w/Differential (85025-CBCD) TLB-Lipid Panel (80061-LIPID)  Labs Reviewed: SGOT: 25 (03/31/2008)   SGPT: 18 (03/31/2008)   HDL:80.3 (03/31/2008), 64.6 (09/03/2006)  LDL:DEL (03/31/2008), DEL (09/03/2006)  Chol:270 (03/31/2008), 233 (09/03/2006)  Trig:63 (03/31/2008), 82 (09/03/2006)  Complete Medication List: 1)  Celexa 20 Mg Tabs (Citalopram hydrobromide) .Marland Kitchen.. 1 by mouth qd 2)  Budeprion Xl 300 Mg Tb24 (Bupropion hcl) .... Take one tablet daily 3)  Zyrtec Hives Relief 10 Mg Tabs (Cetirizine hcl) 4)  B Complex  5)  Preservision Areds Caps (Multiple vitamins-minerals) 6)  Vitamin D-400 400 Unit Tabs (Cholecalciferol) .Marland Kitchen.. 1 tab by mouth daily 7)  Calcium 500 Mg Tabs (Calcium) .... 2 tablets daily 8)  Fish Oil 1000 Mg Caps (Omega-3 fatty acids) .... 2 caps daily 9)  Hydrocortisone 2.5 % Crea (Hydrocortisone) .... Apply two times a day x 10 days 10)  Premarin 0.625 Mg/gm Crea  (Estrogens, conjugated) .... Apply 3 times a week  Other Orders: Prescription Created Electronically [phone]) Tdap => 56yrs IM (76283) Admin 1st Vaccine (15176) Pneumococcal Vaccine (16073) Admin of Any Addtl Vaccine (71062)  Patient Instructions: 1)  Please schedule a follow-up appointment in  3  months .  Prescriptions: BUDEPRION XL 300 MG  TB24 (BUPROPION HCL) Take one tablet daily  #90 x 1   Entered and Authorized by:   Elita Quick E. Rochester Serpe MD   Signed by:   Nolon Rod. Nevena Rozenberg MD on 11/09/2009   Method used:   Electronically to        [facility] [phone]* (retail)       [address]       [place], [place]  [address]       Ph: [phone]       Fax: [phone]   RxID:   [id number] CELEXA 20 MG TABS (CITALOPRAM HYDROBROMIDE) 1 by mouth qd  #90 x 1   Entered and Authorized by:   Nolon Rod. Dorlis Judice MD   Signed by:   Nolon Rod. Collyn Selk MD on 11/09/2009   Method used:   Electronically to        [facility] [phone]* (retail)       [address]       [place], [place]  [address]       Ph: [phone]       Fax: [phone]   RxID:   [id number] PREMARIN 0.625 MG/GM CREA (ESTROGENS, CONJUGATED) apply 3 times a week  #1 x 6   Entered and Authorized by:   Nolon Rod. Pamla Pangle MD   Signed by:   Nolon Rod. Daleena Rotter MD on 11/09/2009   Method used:   Electronically to        [facility] [phone]* (retail)       [address]       [place], [place]  [address]       Ph: [phone]       Fax: [phone]   RxID:   [id number] HYDROCORTISONE 2.5 % CREA (HYDROCORTISONE) apply two times a day x 10 days  #1 x 0   Entered and Authorized by:   Nolon Rod. Megha Agnes MD   Signed by:   Nolon Rod. Liann Spaeth MD on 11/09/2009   Method used:   Electronically to        [facility] [phone]* (retail)       [address]       [place], [place]  [address]       Ph: [phone]       Fax: [phone]   RxID:   [id number]       Immunizations Administered:  Tetanus  Vaccine:    Vaccine Type: Tdap    Site: left deltoid    Mfr:  GlaxoSmithKline    Dose: 0.5 ml    Route: IM    Given by: Army Fossa CMA    Exp. Date: 08/07/2011    Lot #: [id number]  Pneumonia Vaccine:    Vaccine Type: Pneumovax    Site: right deltoid    Mfr: Merck    Dose: 0.5 ml    Route: IM    Given by: Army Fossa CMA    Exp. Date: 06/01/2011    Lot #: [id number]      Appended Document: CPX, WANTS PAP, FASTING, MEDICARE & BCBS/RH...... ADDENDUM:  PELVIC EXAM: external genitalia --no lesion , hair distribution wnl  Urethral meatus -- normal size & location,no lesions  Urethra --no massesor  tenderness, or scarring   Bladder no apparent  masses, or tenderness   Vagina--slightly  appearance,  no discharge lesions   Cervix --poorly visualized but no discharge or lesions    Uterus (i.e., size, contour, position, mobility, tenderness, consistency, descent, or support)  Adnexa/parametria -- limited  exam but no no masses or  tenderness   Anus and perineum-- normal to inspection

## 2010-06-14 NOTE — Assessment & Plan Note (Addendum)
Summary: hip pain/cbs   Vital Signs:  Patient profile:   65 year old female Menstrual status:  postmenopausal Weight:      158 pounds Pulse rate:   83 / minute Pulse rhythm:   regular BP sitting:   122 / 70  (left arm) Cuff size:   regular  Vitals Entered By: Army Fossa CMA (December 13, 2009 11:44 AM) CC: Pt here c/o Left hip pain Comments Pain started in June after a lot of walking in [place] and doing college tours with Grandkids.  Getting worse.   History of Present Illness: did a lot of walking while visiting in [place] in June, afterwards she developed an anterior left hip pain The pain was on and off but somehow improving last week, she had a granddaughter visiting and they did a lot of walking visiting several college campus. Since then the pain is a lot worse, located not only at the anterior left hip but also  in the left buttock, some radiation to the inner aspect of the left thigh. No back pain per se. pain is worse when standing a sitting ; as she starts  walking, the pain decreased advil  helped a little tylenol  did not help  ROS No rash No lower extremity edema No dysuria, gross hematuria or abdominal pain  Current Medications (verified): 1)  Celexa 20 Mg Tabs (Citalopram Hydrobromide) .Marland Kitchen.. 1 By Mouth Qd 2)  Budeprion Xl 300 Mg  Tb24 (Bupropion Hcl) .... Take One Tablet Daily 3)  Zyrtec Hives Relief 10 Mg  Tabs (Cetirizine Hcl) 4)  B Complex 5)  Preservision Areds   Caps (Multiple Vitamins-Minerals) 6)  Vitamin D-400 400 Unit Tabs (Cholecalciferol) .Marland Kitchen.. 1 Tab By Mouth Daily 7)  Calcium 500 Mg Tabs (Calcium) .... 2 Tablets Daily 8)  Fish Oil 1000 Mg Caps (Omega-3 Fatty Acids) .... 2 Caps Daily 9)  Premarin 0.625 Mg/gm Crea (Estrogens, Conjugated) .... Apply 3 Times A Week  Allergies: 1)  ! Sulfa  Past History:  Past Medical History: Reviewed history from 11/09/2009 and no changes required. 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 Caesarean section  Social History: Reviewed history from 11/09/2009 and no changes required. 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   Abdomen:  soft, non-tender, no distention, and no masses.   Msk:  rotation of the right hip normal Rotation of the left hip elicits some pain Nontender on either trochanteric bursa slightly tender at the left sacroiliac area Pulses:  normal femur pulses Extremities:  no lower extremity edema, counts symmetric and nontender Neurologic:  lower extremity DTRs and motor normal   Impression & Recommendations:  Problem # 1:  HIP PAIN (ICD-719.45) given the history, likely a combination of OA  and overuse Plan: X-ray Prednisone for a few days, side effects discussed Advil over-the-counter as needed, see instructions    Orders: T-Hip Comp Left Min 2-views (73510TC)  Complete Medication List: 1)  Celexa 20 Mg Tabs (Citalopram hydrobromide) .Marland Kitchen.. 1 by mouth qd 2)  Budeprion Xl 300 Mg Tb24 (Bupropion hcl) .... Take one tablet daily 3)  Zyrtec Hives Relief 10 Mg Tabs (Cetirizine hcl) 4)  B Complex  5)  Preservision Areds Caps (Multiple  vitamins-minerals) 6)  Vitamin D-400 400 Unit Tabs (Cholecalciferol) .Marland Kitchen.. 1 tab by mouth daily 7)  Calcium 500 Mg Tabs (Calcium) .... 2 tablets daily 8)  Fish Oil 1000 Mg Caps (Omega-3 fatty acids) .... 2 caps daily 9)  Premarin 0.625 Mg/gm Crea (Estrogens, conjugated) .... Apply 3 times a week 10)  Prednisone 10 Mg Tabs (Prednisone) .... 5 by mouth once daily x 2 , 4x2 , 3x2, 3x2,1x2  Patient Instructions: 1)  x-ray 2)  Prednisone as prescribed, may cause insomnia 3)  Take 400  mg of Ibuprofen (Advil, Motrin)  with food every 6 hours as needed  for relief of pain; watch for stomach irritation 4)  Call if not better in 2 weeks. Call any time if symptoms increase Prescriptions: PREDNISONE 10 MG TABS (PREDNISONE) 5 by mouth once daily x 2 , 4x2 , 3x2, 3x2,1x2  #32 x 0   Entered and Authorized by:   Elita Quick E. Kingson Lohmeyer MD   Signed by:   Nolon Rod. Gyanna Jarema MD on 12/13/2009   Method used:   Electronically to        [facility] [phone]* (retail)       [address]       [place], [place]  [address]       Ph: [phone]       Fax: [phone]   RxID:   [id number]

## 2010-06-14 NOTE — Progress Notes (Signed)
Summary: Hip Pain  Phone Note Call from Patient Call back at Home Phone [phone]   Caller: Patient Reason for Call: Acute Illness Summary of Call: Patient called and LM on triage VM stating that she had an xray done yesterday and had horrible hip pain during the night that kept her awake. She found some out of date darvocet and took one and was able to sleep. This morning she is still in pain and would like to something to take. She is taking the prednisone as you ordered. Please adivise.  Initial call taken by: Harold Barban,  December 15, 2009 11:08 AM  Follow-up for Phone Call        advise patient X-ray negative Continue with prednisone as prescribed DCN is off the market Trial with Vicodin 5 mg one every 4 hours as needed for pain, #40, no refills. Watch for drowsiness Follow-up by: Nolon Rod. Pietra Zuluaga MD,  December 15, 2009 1:10 PM  Additional Follow-up for Phone Call Additional follow up Details #1::        I spoke with pt and informed her of everything. Told her in the pain did not get better then to let us know. Army Fossa CMA  December 15, 2009 1:28 PM     New/Updated Medications: VICODIN 5-500 MG TABS (HYDROCODONE-ACETAMINOPHEN) one every 4 hours as needed for pain, #40 Prescriptions: VICODIN 5-500 MG TABS (HYDROCODONE-ACETAMINOPHEN) one every 4 hours as needed for pain, #40  #40 x 0   Entered by:   Army Fossa CMA   Authorized by:   Nolon Rod. Yates Weisgerber MD   Signed by:   Army Fossa CMA on 12/15/2009   Method used:   Printed then faxed to ...       [facility]  [address] [phone]* (retail)       [address]       [place], [place]  [address]       Ph: [phone]       Fax: [phone]   RxID:   [id number]

## 2010-06-14 NOTE — Progress Notes (Signed)
Summary: Leg Pain  Phone Note Call from Patient Call back at Home Phone [phone]   Caller: Patient Summary of Call: Patient called and LM on traige VM stating that Dr. Drue Novel had been treating her hip pain and she now has pain in her whole leg including the calf along with some toe numbness. She wants to know if she should just continue with the pain meds and prednisone or something else? Please adivse.  Initial call taken by: Harold Barban,  December 17, 2009 9:41 AM  Follow-up for Phone Call        please arrange a  orthopedic referral for next week ( or go to the orthopedic urgent care -[facility]- tomorrow) If she has severe pain, leg swelling, change in in the color of her leg: go to the  ER or urgent care Follow-up by: [facility] E. Paz MD,  December 17, 2009 4:42 PM  Additional Follow-up for Phone Call Additional follow up Details #1::        Patient is aware and will watch her leg and would like the ortho referral.  Additional Follow-up by: Harold Barban,  December 17, 2009 4:44 PM

## 2010-06-14 NOTE — Assessment & Plan Note (Signed)
Summary: follow up /drb   Vital Signs:  Patient profile:   65 year old female Menstrual status:  postmenopausal Weight:      159 pounds Pulse rate:   89 / minute Pulse rhythm:   regular BP sitting:   124 / 84  (left arm) Cuff size:   regular  Vitals Entered By: Army Fossa CMA (January 28, 2010 10:08 AM) CC: Follow up, not fasting Comments Still having severe back pain [facility] [address] flu shot   History of Present Illness: followup from last office visit She saw orthopedic surgery, she had an MRI, pain was determined to be a  L5 problem. She had a local injection which so far has not help much She has a brother who is a Land,  he  rec. to use a back brace ; pt will  see a local chiropractor soon. Taking Advil twice a day, tolerating well. Taking Vicodin only at bedtime because she gets to sleep he  Additionally, she nearly  fainted and had a single episode of chest pain in the setting of severe back pain. She went to the ER 8-12 -2011 ER chart reviewed  EKG is normal and at baseline CKs were negative D-dimer negative  ROS No further episode of chest pain No shortness of breath No lower  extremity edema  Current Medications (verified): 1)  Celexa 20 Mg Tabs (Citalopram Hydrobromide) .Marland Kitchen.. 1 By Mouth Qd 2)  Budeprion Xl 300 Mg  Tb24 (Bupropion Hcl) .... Take One Tablet Daily 3)  Zyrtec Hives Relief 10 Mg  Tabs (Cetirizine Hcl) 4)  B Complex 5)  Preservision Areds   Caps (Multiple Vitamins-Minerals) 6)  Vitamin D-400 400 Unit Tabs (Cholecalciferol) .Marland Kitchen.. 1 Tab By Mouth Daily 7)  Calcium 500 Mg Tabs (Calcium) .... 2 Tablets Daily 8)  Fish Oil 1000 Mg Caps (Omega-3 Fatty Acids) .... 2 Caps Daily 9)  Premarin 0.625 Mg/gm Crea (Estrogens, Conjugated) .... Apply 3 Times A Week 10)  Vicodin 5-500 Mg Tabs (Hydrocodone-Acetaminophen) .... One Every 4 Hours As Needed For Pain, #40  Allergies (verified): 1)  ! Sulfa  Past History:  Past Medical  History: Reviewed history from 11/09/2009 and no changes required. 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 Caesarean section  Social History: Reviewed history from 11/09/2009 and no changes required. 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   Lungs:  normal respiratory effort, no intercostal retractions, no accessory muscle use, and normal breath sounds.   Heart:  normal rate, regular rhythm, no murmur, and no gallop.   Extremities:  no edema Neurologic:  woke with mild  difficulty due to pain. Lower extremity motor strength symmetric DTRs symmetric except for a slightly decreased left ankle jerk   Impression & Recommendations:  Problem # 1:  LUMBAR RADICULOPATHY (ICD-724.4) after she saw orthopedic surgery, she was diagnosed with a left L4-L5 radiculopathy Status post a steroid injection No improving much Plan: recommend patient to continue taking Vicodin or Flexeril at bedtime We'll continue with ibuprofen twice a day, declined to try meloxicam She plans to see a local chiropractor, recommend avoid extensive manipulation I also recommend her to call  orthopedic surgery if the symptoms continue, she was told she may need surgery.  Her updated medication list for this problem includes:    Vicodin 5-500 Mg Tabs (Hydrocodone-acetaminophen) ..... One every 4 hours as needed for pain, #40    Cyclobenzaprine Hcl 10 Mg Tabs (Cyclobenzaprine hcl) .Marland Kitchen... 1 by mouth at bedtime as needed pain  Problem # 2:  CHEST PAIN (ICD-786.50) single episode of chest pain, she went to the ER, was recommended to have outpatient stress  test Patient quite reluctant to proceed with that We agree that she will call if anything changes  Medications Added to Medication List This Visit: 1)  Cyclobenzaprine Hcl 10 Mg Tabs (Cyclobenzaprine hcl) .Marland Kitchen.. 1 by mouth at bedtime as needed pain  Complete Medication List: 1)  Celexa 20 Mg Tabs (Citalopram hydrobromide) .Marland Kitchen.. 1 by mouth qd 2)  Budeprion Xl 300 Mg Tb24 (Bupropion hcl) .... Take one tablet daily 3)  Zyrtec Hives Relief 10 Mg Tabs (Cetirizine hcl) 4)  B Complex  5)  Preservision Areds Caps (Multiple vitamins-minerals) 6)  Vitamin D-400 400 Unit Tabs (Cholecalciferol) .Marland Kitchen.. 1 tab by mouth daily 7)  Calcium 500 Mg Tabs (Calcium) .... 2 tablets daily 8)  Fish Oil 1000 Mg Caps (Omega-3 fatty acids) .... 2 caps daily 9)  Premarin 0.625 Mg/gm Crea (Estrogens, conjugated) .... Apply 3 times a week 10)  Vicodin 5-500 Mg Tabs (Hydrocodone-acetaminophen) .... One every 4 hours as needed for pain, #40 11)  Cyclobenzaprine Hcl 10 Mg Tabs (Cyclobenzaprine hcl) .Marland Kitchen.. 1 by mouth at bedtime as needed pain  Other Orders: Flu Vaccine 7yrs + MEDICARE PATIENTS (J8119) Administration Flu vaccine - MCR (J4782) Flu Vaccine Consent Questions     Do you have a history of severe allergic reactions to this vaccine? no    Any prior history of allergic reactions to egg and/or gelatin? no    Do you have a sensitivity to the preservative Thimersol? no    Do you have a past history of Guillan-Barre Syndrome? no    Do you currently have an acute febrile illness? no    Have you ever had a severe reaction to latex? no    Vaccine information given and explained to patient? yes    Are you currently pregnant? no    Lot Number:[id number]   Exp Date:11/12/2010   Site Given  Left Deltoid IM  Patient Instructions: 1)  use cyclobenzaprine or vicodin , see which one helps better 2)  Please schedule a follow-up appointment in 3 months .  Prescriptions: CYCLOBENZAPRINE HCL 10 MG TABS (CYCLOBENZAPRINE HCL) 1 by  mouth at bedtime as needed pain  #21 x 0   Entered and Authorized by:   Nolon Rod. Paz MD   Signed by:   Nolon Rod. Paz MD on 01/28/2010   Method used:   Electronically to        [facility] [phone]* (retail)       [address]       [place], [place]  [address]       Ph: [phone]       Fax: [phone]   RxID:   [id number]    .lbmedflu

## 2010-06-14 NOTE — Medication Information (Signed)
Summary: Letter Regarding Budeprion/[employer]  Letter Regarding Budeprion/[employer]   Imported By: Lanelle Bal 07/05/2009 11:26:29  _____________________________________________________________________  External Attachment:    Type:   Image     Comment:   External Document

## 2010-06-14 NOTE — Progress Notes (Signed)
Summary: Referral/shots  Phone Note Call from Patient Call back at Home Phone [phone]   Summary of Call: Pt called and states she has been trying for a week to get in with Dr.Ramos to get the shots per the orders of Dr.Gioffre. Pt is unable to schedule an appt she would like to know if we can put in a referall and make the appt for her. Please advise. Army Fossa CMA  January 13, 2010 9:58    Follow-up for Phone Call        please do Follow-up by: [facility] E. Turki Tapanes MD,  January 13, 2010 12:02 PM  Additional Follow-up for Phone Call Additional follow up Details #1::        REFERRAL/ORDER FAXED TO [facility], THEY CONTACT THE PATIENT. Additional Follow-up by: Magdalen Spatz [facility],  January 13, 2010 2:59 PM

## 2010-06-14 NOTE — Progress Notes (Signed)
Summary: near syncope, advised ER -FYI  Phone Note Call from Patient   Caller: Patient Call For: [phone] Summary of Call: Received voice message from pt. stating she had a couple episodes this a.m. of feeling faint, weak, s.o.b and diaphoresis. Pt states she was recently put on Vicodin by the orthopedic Dr. for herniated disc.  Wanted to know if this could be coming from the medication. Advised pt she needed to go to the ER for evaluation. Pt voices understanding. Nicki Guadalajara Fergerson CMA Duncan Dull)  December 24, 2009 10:58 AM   Follow-up for Phone Call        please check on her Monday [place] E. Paz MD  December 26, 2009 10:13 AM   Additional Follow-up for Phone Call Additional follow up Details #1::        Left message for pt to call back. Army Fossa CMA  December 27, 2009 12:50 PM     Additional Follow-up for Phone Call Additional follow up Details #2::    I spoke with pt and she states that she has numbness in her leg- her ortho is ordering an MRI for this. At the ER they asked her to get a stress test. She declined to get this due to whats going on with her leg. She feels that all of this is coming from the Prednisone. The vicodin is not touching the hip pain, she is using Advil. The numbness in her leg and foot is whats bothering her most. She is waiting for [facility] to contact her to schedule the MRI. Army Fossa CMA  December 28, 2009 9:11 AM  Noted. she should have office visit with me next month. advised patient to call us sooner if she needs help so we can give her a sooner appointment Jose E. Paz MD  December 28, 2009 2:17 PM   Additional Follow-up for Phone Call Additional follow up Details #3:: Details for Additional Follow-up Action Taken: Pt is aware, appt scheduled. Army Fossa CMA  December 28, 2009 2:59 PM

## 2010-06-14 NOTE — Letter (Signed)
Summary: Primary Care Appointment Letter  [facility] [place]  [address] [place], [place] [address]   Phone: [phone]  Fax: [phone]    09/28/2009 MRN: [id number]  [facility] [address] [place], [place]  [address]  Dear Ms. [name],   Your Primary Care Physician [facility] E. Paz MD has indicated that:    _______it is time to schedule an appointment.    _______you missed your appointment on______ and need to call and          reschedule.    _______you need to have lab work done.    _______you need to schedule an appointment discuss lab or test results.    _______you need to call to reschedule your appointment that is                       scheduled on _________.     Please call our office as soon as possible. Our phone number is [phone]. Please press option 1. Our office is open 8a-12noon and 1p-5p, Monday through Friday.     Thank you,    [facility] Scheduler

## 2010-06-14 NOTE — Op Note (Signed)
Summary: [facility]  [facility]   Imported By: Lanelle Bal 02/16/2010 08:45:46  _____________________________________________________________________  External Attachment:    Type:   Image     Comment:   External Document

## 2010-06-14 NOTE — Progress Notes (Signed)
Summary: seen 09/15/09 still itchy  Phone Note Call from Patient Call back at Home Phone [phone] Call back at after 10:30 cell [phone]   Caller: Patient Summary of Call: --Pt seen 09/15/09 for itchy rash had shot and finished pred pak, much better, big patches dry but still itchy has a few tiny new spots. --Using Benadryl Cream and Gold Bond,gets 2 hours of relief then back to itching,cannot sleep due to itching.  Use [facility] --Washing all clothes that came in contact with rash  Initial call taken by: Kandice Hams,  Sep 22, 2009 10:11 AM  Follow-up for Phone Call        Based on that description pt does not need addtional steroids- they are reserved for severe sxs.  should take benadryl for itching/sleeping.  return if sxs worsen Follow-up by: Neena Rhymes MD,  Sep 22, 2009 10:38 AM  Additional Follow-up for Phone Call Additional follow up Details #1::        pt informed of Dr Beverely Low recommendations, will give it a few more days before OV .Kandice Hams  Sep 22, 2009 10:58 AM  Additional Follow-up by: Kandice Hams,  Sep 22, 2009 10:58 AM

## 2010-06-14 NOTE — Assessment & Plan Note (Signed)
Summary: itching new breakout midsection,thighs buttocks/alr   Vital Signs:  Patient profile:   65 year old female Menstrual status:  postmenopausal Weight:      159 pounds Pulse rate:   70 / minute BP sitting:   122 / 80  (left arm)  Vitals Entered By: Doristine Devoid (Sep 23, 2009 2:01 PM) CC: rash all over lots of itching no improvement   History of Present Illness: 65 yo woman here today for spreading rash.  + itching.  areas initially improved w/ steroids but spread after stopping steroids 2 days ago.  now covers arms, abd, thighs, buttocks, chest.  Current Medications (verified): 1)  Celexa 20 Mg Tabs (Citalopram Hydrobromide) .Marland Kitchen.. 1 By Mouth Qd 2)  Budeprion Xl 300 Mg  Tb24 (Bupropion Hcl) .... Take One Tablet Daily 3)  Zyrtec Hives Relief 10 Mg  Tabs (Cetirizine Hcl) 4)  Coenzyme Q10 100 Mg Caps (Coenzyme Q10) .... Take One Tablet Daily 5)  B Complex 6)  Preservision Areds   Caps (Multiple Vitamins-Minerals) 7)  Vitamin D-400 400 Unit Tabs (Cholecalciferol) .Marland Kitchen.. 1 Tab By Mouth Daily 8)  Calcium 500 Mg Tabs (Calcium) .... 2 Tablets Daily 9)  Prednisone 20 Mg Tabs (Prednisone) .... 2 Tabs X6 Days and Then 1 Tab X 6 Days and Then 1/2 Tab X 6 Days  Allergies (verified): 1)  ! Sulfa  Review of Systems      See HPI  Physical Exam  General:  Well-developed,well-nourished,in no acute distress; alert,appropriate and cooperative throughout examination Skin:  erythematous vesicular rash in clusters on arms, thighs, abd, chest.  + excoriations, no evidence of infx.   Impression & Recommendations:  Problem # 1:  RASH AND OTHER NONSPECIFIC SKIN ERUPTION (ICD-782.1) Assessment Deteriorated  pt's contact dermatitis has worsened.  possibly a flare from steroid withdraw.  will treat w/ long taper as recommended in AFP journal 8/10.  reviewed supportive care and red flags that should prompt return.  Pt expresses understanding and is in agreement w/ this plan.  Orders: Prescription  Created Electronically [phone])  Complete Medication List: 1)  Celexa 20 Mg Tabs (Citalopram hydrobromide) .Marland Kitchen.. 1 by mouth qd 2)  Budeprion Xl 300 Mg Tb24 (Bupropion hcl) .... Take one tablet daily 3)  Zyrtec Hives Relief 10 Mg Tabs (Cetirizine hcl) 4)  Coenzyme Q10 100 Mg Caps (Coenzyme q10) .... Take one tablet daily 5)  B Complex  6)  Preservision Areds Caps (Multiple vitamins-minerals) 7)  Vitamin D-400 400 Unit Tabs (Cholecalciferol) .Marland Kitchen.. 1 tab by mouth daily 8)  Calcium 500 Mg Tabs (Calcium) .... 2 tablets daily 9)  Prednisone 20 Mg Tabs (Prednisone) .... 2 tabs x6 days and then 1 tab x 6 days and then 1/2 tab x 6 days  Patient Instructions: 1)  Take the steroids as directed 2)  Benadryl as needed for itching 3)  Avoid scratching to prevent infection 4)  Hang in there!!! Prescriptions: PREDNISONE 20 MG TABS (PREDNISONE) 2 tabs x6 days and then 1 tab x 6 days and then 1/2 tab x 6 days  #21 x 0   Entered and Authorized by:   Neena Rhymes MD   Signed by:   Neena Rhymes MD on 09/23/2009   Method used:   Electronically to        [facility] [phone]* (retail)       [address]       [place], [place]  [address]       Ph: [phone]  Fax: [phone]   RxID:   [id number]

## 2010-06-14 NOTE — Letter (Signed)
Summary[facility]   Imported By: Lanelle Bal 04/20/2010 14:33:44  _____________________________________________________________________  External Attachment:    Type:   Image     Comment:   External Document

## 2010-06-14 NOTE — Progress Notes (Signed)
Summary: due cpx   Phone Note Outgoing Call Call back at [facility] Phone [phone] Call back at Work Phone [phone]   Summary of Call: Please contact pt -- she is due for a complete physical .......Marland KitchenShary Decamp  Sep 24, 2009 9:17 AM   Follow-up for Phone Call        lmtcb.Harold Barban  Sep 24, 2009 12:20 PM  lmtcb.Harold Barban  Sep 27, 2009 10:23 AM  Additional Follow-up for Phone Call Additional follow up Details #1::        lmtcb and mailed letter. Additional Follow-up by: Harold Barban,  Sep 28, 2009 10:43 AM

## 2010-06-14 NOTE — Letter (Signed)
Summary: [facility] [facility] [facility]  [facility]   Imported By: Lanelle Bal 07/01/2009 12:43:20  _____________________________________________________________________  External Attachment:    Type:   Image     Comment:   External Document

## 2010-06-16 ENCOUNTER — Ambulatory Visit: Payer: Medicare Other | Attending: Orthopedic Surgery | Admitting: Physical Therapy

## 2010-06-16 DIAGNOSIS — IMO0001 Reserved for inherently not codable concepts without codable children: Secondary | ICD-10-CM | POA: Insufficient documentation

## 2010-06-16 DIAGNOSIS — M25579 Pain in unspecified ankle and joints of unspecified foot: Secondary | ICD-10-CM | POA: Insufficient documentation

## 2010-06-16 DIAGNOSIS — R262 Difficulty in walking, not elsewhere classified: Secondary | ICD-10-CM | POA: Insufficient documentation

## 2010-06-16 DIAGNOSIS — M25673 Stiffness of unspecified ankle, not elsewhere classified: Secondary | ICD-10-CM | POA: Insufficient documentation

## 2010-06-16 DIAGNOSIS — M25676 Stiffness of unspecified foot, not elsewhere classified: Secondary | ICD-10-CM | POA: Insufficient documentation

## 2010-06-16 NOTE — Progress Notes (Signed)
Summary: rx request  Phone Note Call from Patient Call back at [phone]   Details for Reason: [facility] Summary of Call: Patient left message on triage that she needs prescription for:  Hydrocodone, Celexa, and Buproprion. Dr Fonnie Jarvis office has delayed calling in pain med and she really needs it. Please advise. Initial call taken by: Lucious Groves CMA,  June 02, 2010 11:21 AM  Follow-up for Phone Call        okay 6 months of Celexa , bupropion Okay Vicodin #100, no refills Follow-up by: Nolon Rod. Mayer Vondrak MD,  June 02, 2010 2:16 PM  Additional Follow-up for Phone Call Additional follow up Details #1::        Called pt to tell that we sent in meds. She told me not to call in the Vicodin b/c the prescribing doctor did . I cancelled the rx for Vicodin. Army Fossa CMA  June 03, 2010 8:10 AM     Prescriptions: BUDEPRION XL 300 MG  TB24 (BUPROPION HCL) Take one tablet daily.  #90 x 1   Entered by:   Army Fossa CMA   Authorized by:   Nolon Rod. Karion Cudd MD   Signed by:   Army Fossa CMA on 06/03/2010   Method used:   Re-Faxed to ...       [facility]  [address] [phone]* (retail)       [address]       [place], [place]  [address]       Ph: [phone]       Fax: [phone]   RxID:   [id number] CELEXA 20 MG TABS (CITALOPRAM HYDROBROMIDE) 1.5  by mouth once daily  #90 x 1   Entered by:   Army Fossa CMA   Authorized by:   Nolon Rod. Yusef Lamp MD   Signed by:   Army Fossa CMA on 06/03/2010   Method used:   Re-Faxed to ...       [facility]  [address] [phone]* (retail)       [address]       [place], [place]  [address]       Ph: [phone]       Fax: [phone]   RxID:   [id number] HYDROCODONE-ACETAMINOPHEN 5-325 MG TABS (HYDROCODONE-ACETAMINOPHEN) 1 by mouth every 6 hours  #100 x 0   Entered by:   Army Fossa CMA   Authorized by:   Nolon Rod. Piercen Covino MD   Signed by:   Army Fossa CMA on 06/03/2010   Method used:   Printed then faxed to ...       [facility]  [address] [phone]* (retail)       [address]       [place], [place]  [address]       Ph: [phone]       Fax: [phone]   RxID:   [id number] BUDEPRION XL 300 MG  TB24 (BUPROPION HCL) Take one tablet daily.  #90 x 1   Entered by:   Army Fossa CMA   Authorized by:   Nolon Rod. Sondi Desch MD   Signed by:   Army Fossa CMA on 06/03/2010   Method used:   Printed then faxed to ...       [facility]  [address] [phone]* (retail)       [address]       [place], [place]  [address]       Ph: [phone]       Fax: [phone]  RxID:   [id number] CELEXA 20 MG TABS (CITALOPRAM HYDROBROMIDE) 1.5  by mouth once daily  #90 x 1   Entered by:   Army Fossa CMA   Authorized by:   Nolon Rod. Harris Penton MD   Signed by:   Army Fossa CMA on 06/03/2010   Method used:   Printed then faxed to ...       [facility]  [address] [phone]* (retail)       [address]       [place], [place]  [address]       Ph: [phone]       Fax: [phone]   RxID:   [id number]

## 2010-06-16 NOTE — Assessment & Plan Note (Signed)
Summary: 3 month followup///sph   Vital Signs:  Patient profile:   65 year old female Menstrual status:  postmenopausal Weight:      156 pounds Pulse rate:   76 / minute Pulse rhythm:   regular BP sitting:   124 / 82  (left arm) Cuff size:   regular  Vitals Entered By: Army Fossa CMA (June 02, 2010 9:38 AM) CC: 3 month f/u- not fasting  Comments Celexa and Budeprion needs refills- print and give to pt.    History of Present Illness: since the last office visit, she fell and had a ankle fracture  ---> surgery 04/2010 doing physical therapy for that. As far as the pain, her main pain besides the ankle is at the left hip area. the doctor taking care of the ankle is referring him to Dr. Charlann Boxer for eval  of the hip pain.  ROS depression this is slightly worse mostly because she is unable to do things  and has a lot of  pain. No suicidal ideas  Current Medications (verified): 1)  Celexa 20 Mg Tabs (Citalopram Hydrobromide) .Marland Kitchen.. 1 By Mouth Once Daily. Need Office Visit Before Additional Refills. 2)  Budeprion Xl 300 Mg  Tb24 (Bupropion Hcl) .... Take One Tablet Daily. Due For Office Visit Before Additional Refills. 3)  Zyrtec Hives Relief 10 Mg  Tabs (Cetirizine Hcl) 4)  B Complex 5)  Preservision Areds   Caps (Multiple Vitamins-Minerals) 6)  Vitamin D-400 400 Unit Tabs (Cholecalciferol) .Marland Kitchen.. 1 Tab By Mouth Daily 7)  Calcium 500 Mg Tabs (Calcium) .... 2 Tablets Daily 8)  Fish Oil 1000 Mg Caps (Omega-3 Fatty Acids) .... 2 Caps Daily 9)  Vicodin 5-500 Mg Tabs (Hydrocodone-Acetaminophen) .... One Every 4 Hours As Needed For Pain, #40 10)  Robaxin 500 Mg Tabs (Methocarbamol) .... Per Ortho 11)  Ibuprofen 600 Mg Tabs (Ibuprofen) .... Three Times A Day Per Ortho  Allergies (verified): 1)  ! Sulfa  Past History:  Past Medical History: Reviewed history from 11/09/2009 and no changes required. Allergic rhinitis Depression Osteoporosis Constipation, chronic HAs chest x-ray  2008 showed  "emphysema", PFTs 2009 normal  Past Surgical History: Reviewed history from 08/17/2006 and no changes required. Caesarean section  Social History: Reviewed history from 11/09/2009 and no changes required. 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   Psych:  Oriented X3, memory intact for recent and remote, normally interactive, good eye contact, and not anxious appearing.  slightly depressed appearing   Impression & Recommendations:  Problem # 1:  DEGENERATIVE JOINT DISEASE (ICD-715.90) she has a number of orthopedic problems Back pain,  MRI  ~ 8-11 showed spinal stenosis at the L5 left-sided problem. Was referred to Dr. Ethelene Hal, local injection did not help Fracture, left ankle, status post surgery Dr. Lestine Box 03-2010 now with L hip pain, to see Dr. Charlann Boxer soon. Unclear if pain is actually from the hip or from the back we went through her medicines, we agreed that she will increase her Vicodin from 2 a day to 4 a day as needed. She is afraid to take too much medicine. I told the patient I think 4  Vicodin daily is completely appropriate. I also encouraged her to speak with her physical therapist, she needs to be physically active, hopefully they will be able to tailor a program for her. I will  be happy to sign a PT order if needed. will call if Vicodin RF is  needed Her updated medication list for this problem includes:    Hydrocodone-acetaminophen 5-325 Mg Tabs (Hydrocodone-acetaminophen) .Marland Kitchen... 1 by mouth every 6 hours    Ibuprofen 600 Mg Tabs (Ibuprofen) .Marland Kitchen... Three times a day per ortho  Problem # 2:  DEPRESSION (ICD-311) counseled , increase Celexa dose from 20 to 30 mg daily temporarily Her updated medication list for this problem includes:    Celexa 20 Mg Tabs (Citalopram hydrobromide) .Marland Kitchen... 1.5  by mouth once  daily    Budeprion Xl 300 Mg Tb24 (Bupropion hcl) .Marland Kitchen... Take one tablet daily.  Problem # 3:  face-to-face time 20 minutes, more that 50% of the time counseling  Complete Medication List: 1)  Celexa 20 Mg Tabs (Citalopram hydrobromide) .... 1.5  by mouth once daily 2)  Budeprion Xl 300 Mg Tb24 (Bupropion hcl) .... Take one tablet daily. 3)  Zyrtec Hives Relief 10 Mg Tabs (Cetirizine hcl) 4)  B Complex  5)  Preservision Areds Caps (Multiple vitamins-minerals) 6)  Vitamin D-400 400 Unit Tabs (Cholecalciferol) .Marland Kitchen.. 1 tab by mouth daily 7)  Calcium 500 Mg Tabs (Calcium) .... 2 tablets daily 8)  Fish Oil 1000 Mg Caps (Omega-3 fatty acids) .... 2 caps daily 9)  Hydrocodone-acetaminophen 5-325 Mg Tabs (Hydrocodone-acetaminophen) .Marland Kitchen.. 1 by mouth every 6 hours 10)  Robaxin 500 Mg Tabs (Methocarbamol) .Marland Kitchen.. 1 at night per ortho 11)  Ibuprofen 600 Mg Tabs (Ibuprofen) .... Three times a day per ortho  Patient Instructions: 1)  Please schedule a follow-up appointment in 3 months .    Orders Added: 1)  Est. Patient Level III [[address]]

## 2010-06-21 ENCOUNTER — Ambulatory Visit: Payer: Medicare Other | Admitting: Physical Therapy

## 2010-06-22 ENCOUNTER — Telehealth: Payer: Self-pay | Admitting: Internal Medicine

## 2010-06-23 ENCOUNTER — Ambulatory Visit: Payer: Medicare Other | Admitting: Physical Therapy

## 2010-06-27 ENCOUNTER — Ambulatory Visit: Payer: Medicare Other | Admitting: Physical Therapy

## 2010-06-30 NOTE — Progress Notes (Signed)
Summary: Pain med request  Phone Note Call from Patient Call back at [phone]   Summary of Call: Patient left message on triage that she is having a great deal of pain and would like prescription for Hydrocodone 5-325 that she discusseed with the MD sent to her [facility]. [facility] [place] [place]). Please advise. Initial call taken by: Lucious Groves CMA,  June 22, 2010 10:51 AM  Follow-up for Phone Call        #100, 1 RF Marlei Glomski E. Malik Ruffino MD  June 22, 2010 3:30 PM   Additional Follow-up for Phone Call Additional follow up Details #1::        Patient notified. Additional Follow-up by: Lucious Groves CMA,  June 22, 2010 3:48 PM    Prescriptions: HYDROCODONE-ACETAMINOPHEN 5-325 MG TABS (HYDROCODONE-ACETAMINOPHEN) 1 by mouth every 6 hours  #100 x 0   Entered by:   Lucious Groves CMA   Authorized by:   Nolon Rod. Kaniah Rizzolo MD   Signed by:   Lucious Groves CMA on 06/22/2010   Method used:   Printed then faxed to ...       [id number]* (retail)       [address]       [place], [place]  [address]       Ph: [phone]       Fax: [phone]   RxID:   [id number]

## 2010-07-01 ENCOUNTER — Ambulatory Visit: Payer: Medicare Other | Admitting: Physical Therapy

## 2010-07-06 NOTE — Consult Note (Signed)
Summary: [facility]   Imported By: Lanelle Bal 06/20/2010 12:47:12  _____________________________________________________________________  External Attachment:    Type:   Image     Comment:   External Document

## 2010-07-13 ENCOUNTER — Ambulatory Visit: Payer: Medicare Other | Admitting: Physical Therapy

## 2010-07-14 ENCOUNTER — Encounter: Payer: Self-pay | Admitting: Internal Medicine

## 2010-07-29 LAB — BASIC METABOLIC PANEL
CO2: 27 mEq/L (ref 19–32)
Calcium: 9.3 mg/dL (ref 8.4–10.5)
GFR calc Af Amer: >60 mL/min (ref >60)
GFR calc non Af Amer: >60 mL/min (ref >60)
Glucose, Bld: 105 mg/dL — ABNORMAL HIGH (ref 70–99)
Potassium: 3.9 mEq/L (ref 3.5–5.1)
Sodium: 140 mEq/L (ref 135–145)

## 2010-07-29 LAB — D-DIMER, QUANTITATIVE: D-Dimer, Quant: <0.22 ug/mL-FEU (ref 0.00–0.48)

## 2010-07-29 LAB — DIFFERENTIAL
Eosinophils Absolute: 0 10*3/uL (ref 0.0–0.7)
Lymphocytes Relative: 13 % (ref 12–46)
Lymphs Abs: 1 10*3/uL (ref 0.7–4.0)
Neutro Abs: 6.3 10*3/uL (ref 1.7–7.7)
Neutrophils Relative %: 81 % — ABNORMAL HIGH (ref 43–77)

## 2010-07-29 LAB — CK TOTAL AND CKMB (NOT AT ARMC)
CK, MB: 0.8 ng/mL (ref 0.3–4.0)
CK, MB: 0.9 ng/mL (ref 0.3–4.0)
Relative Index: INVALID (ref 0.0–2.5)

## 2010-07-29 LAB — CBC
HCT: 36.8 % (ref 36.0–46.0)
Hemoglobin: 12.8 g/dL (ref 12.0–15.0)
MCV: 95.1 fL (ref 78.0–100.0)
Platelets: 348 10*3/uL (ref 150–400)
RBC: 3.87 MIL/uL (ref 3.87–5.11)
WBC: 7.8 10*3/uL (ref 4.0–10.5)

## 2010-07-29 LAB — TROPONIN I: Troponin I: 0.01 ng/mL (ref 0.00–0.06)

## 2010-08-30 ENCOUNTER — Encounter: Payer: Self-pay | Admitting: Internal Medicine

## 2010-08-30 ENCOUNTER — Ambulatory Visit (INDEPENDENT_AMBULATORY_CARE_PROVIDER_SITE_OTHER): Payer: Medicare Other | Admitting: Internal Medicine

## 2010-08-30 DIAGNOSIS — M8580 Other specified disorders of bone density and structure, unspecified site: Secondary | ICD-10-CM | POA: Insufficient documentation

## 2010-08-30 DIAGNOSIS — R21 Rash and other nonspecific skin eruption: Secondary | ICD-10-CM | POA: Insufficient documentation

## 2010-08-30 DIAGNOSIS — M1990 Unspecified osteoarthritis, unspecified site: Secondary | ICD-10-CM

## 2010-08-30 MED ORDER — ACETAMINOPHEN-CODEINE #3 300-30 MG PO TABS: 1.0000 | ORAL_TABLET | Freq: Four times a day (QID) | ORAL | Status: AC | PRN

## 2010-08-30 MED ORDER — IBUPROFEN 600 MG PO TABS: ORAL_TABLET | ORAL | Status: DC

## 2010-08-30 MED ORDER — VALACYCLOVIR HCL 500 MG PO TABS: 500.0000 mg | ORAL_TABLET | Freq: Two times a day (BID) | ORAL | Status: DC

## 2010-08-30 NOTE — Assessment & Plan Note (Addendum)
Sx still require a high dose of Motrin. Fortunately she does not have any GI side effects. Vicodin did not help much. Plan: Continue with Motrin. I asked her to use tylenol #3 (ultram interacts w/ wellbutrin) as needed to see if we can decreased the need of Motrin.

## 2010-08-30 NOTE — Assessment & Plan Note (Signed)
Group of blisters on the left scalp, likely a herpetic infection. Symptoms started 2 days ago, will prescribe acyclovir.

## 2010-08-30 NOTE — Patient Instructions (Signed)
Try to decrease the use of Motrin by use and Tylenol #3, watch for drowsiness

## 2010-08-30 NOTE — Progress Notes (Signed)
  Subjective:    Patient ID: Cynthia [name], female    DOB: 1945/12/29, 66 y.o.   MRN: [id number]  HPI Routine office visit Left ankle pain decreased Continue with back pain, taking Avapro for an around 600 mg every 4-6 hours. The pain is aggravated by walking. She is now taking Vicodin, he did not help much and cause for insomnia. hit her head? Noted pain at the left side of the scalp few days ago.      Past Medical History  Diagnosis Date  . Depression   . Constipation, chronic   . Headache   . Emphysema     chest xray showed, PFTs normal 2009  . Osteoporosis   . DEGENERATIVE JOINT DISEASE 06/02/2010   Past Surgical History  Procedure Date  . Cesarean section   . Ankle fracture surgery     surgery 12-11 (LEFT)     Review of Systems No nausea, vomiting, diarrhea. No change in the color of her stools. Occasionally epigastric burning. No actual abdominal pain.    Objective:   Physical Exam  Constitutional: She appears well-developed and well-nourished.  Cardiovascular: Normal rate, regular rhythm and normal heart sounds.   Pulmonary/Chest: Effort normal and breath sounds normal.  Skin:     Psychiatric: She has a normal mood and affect. Her behavior is normal. Judgment and thought content normal.          Assessment & Plan:

## 2010-09-26 ENCOUNTER — Other Ambulatory Visit: Payer: Self-pay | Admitting: *Deleted

## 2010-09-26 MED ORDER — BUPROPION HCL ER (XL) 300 MG PO TB24: 300.0000 mg | ORAL_TABLET | Freq: Every day | ORAL | Status: DC

## 2010-09-30 NOTE — Op Note (Signed)
   NAME:  JOELIE, SCHOU                         ACCOUNT NO.:  [id number]   MEDICAL RECORD NO.:  [id number]                   PATIENT TYPE:  AMB   LOCATION:  ENDO                                 FACILITY:  [facility]   PHYSICIAN:  Danise Edge, M.D.                DATE OF BIRTH:  Nov 15, 1945   DATE OF PROCEDURE:  02/09/2003  DATE OF DISCHARGE:                                 OPERATIVE REPORT   PROCEDURE:  Diagnostic colonoscopy.   REFERRED BY:  Francis P. Modesto Charon, M.D.   INDICATIONS FOR PROCEDURE:  Ms. Murphy Bundick is a 65 year old female born  [birth year]. Ms. Hettich is undergoing a diagnostic colonoscopy to  evaluate guaiac positive stool.   ENDOSCOPIST:  Charolett Bumpers, M.D.   PREMEDICATION:  Versed 5 mg, Demerol 50 mg .   DESCRIPTION OF PROCEDURE:  After obtaining informed consent, Ms. Hollings was  placed in the left lateral decubitus position. I administered intravenous  Demerol and intravenous Versed to achieve conscious sedation for the  procedure. The patient's blood pressure, oxygen saturation and cardiac  rhythm were monitored throughout the procedure and documented in the medical  record.   Anal inspection was normal. Digital rectal exam was normal. The Olympus  adjustable  pediatric colonoscope was introduced into the rectum and  advanced to the cecum. A normal appearing ileocecal valve was intubated and  the distal ileum inspection. Colonic preparation for the exam today was  excellent.   RECTUM:  Normal.   SIGMOID COLON AND DESCENDING COLON:  Normal.   SPLENIC FLEXURE:  Normal.   TRANSVERSE COLON:  Normal.   HEPATIC FLEXURE:  Normal.   ASCENDING COLON:  Normal.   CECUM AND ILEOCECAL VALVE:  Normal.   DISTAL ILEUM:  Normal.    ASSESSMENT:  Normal proctocolonoscopy to the cecum. Normal distal ileum by  endoscopic inspection. No endoscopic evidence for the presence of colorectal  neoplasia or gastrointestinal bleeding.                     Danise Edge, M.D.    MJ/MEDQ  D:  02/09/2003  T:  02/09/2003  Job:  [id number]

## 2010-10-04 ENCOUNTER — Other Ambulatory Visit: Payer: Self-pay | Admitting: Internal Medicine

## 2010-10-04 DIAGNOSIS — Z1231 Encounter for screening mammogram for malignant neoplasm of breast: Secondary | ICD-10-CM

## 2010-10-12 ENCOUNTER — Ambulatory Visit
Admission: RE | Admit: 2010-10-12 | Discharge: 2010-10-12 | Disposition: A | Discharge status: Home / Self Care | Payer: Medicare Other | Source: Ambulatory Visit | Attending: Internal Medicine | Admitting: Internal Medicine

## 2010-10-12 DIAGNOSIS — Z1231 Encounter for screening mammogram for malignant neoplasm of breast: Secondary | ICD-10-CM

## 2010-10-24 ENCOUNTER — Inpatient Hospital Stay (HOSPITAL_COMMUNITY)
Admission: EM | Admit: 2010-10-24 | Discharge: 2010-10-26 | DRG: 378 | Disposition: A | Discharge status: Home / Self Care | Payer: Medicare Other | Attending: Internal Medicine | Admitting: Internal Medicine

## 2010-10-24 ENCOUNTER — Emergency Department (HOSPITAL_COMMUNITY): Payer: Medicare Other

## 2010-10-24 ENCOUNTER — Ambulatory Visit (INDEPENDENT_AMBULATORY_CARE_PROVIDER_SITE_OTHER): Payer: Medicare Other | Admitting: Internal Medicine

## 2010-10-24 ENCOUNTER — Encounter: Payer: Self-pay | Admitting: Internal Medicine

## 2010-10-24 VITALS — BP 98/76 | HR 135 | Temp 98.4°F

## 2010-10-24 DIAGNOSIS — M810 Age-related osteoporosis without current pathological fracture: Secondary | ICD-10-CM | POA: Diagnosis present

## 2010-10-24 DIAGNOSIS — R079 Chest pain, unspecified: Secondary | ICD-10-CM

## 2010-10-24 DIAGNOSIS — M545 Low back pain, unspecified: Secondary | ICD-10-CM | POA: Diagnosis present

## 2010-10-24 DIAGNOSIS — R42 Dizziness and giddiness: Secondary | ICD-10-CM

## 2010-10-24 DIAGNOSIS — M1990 Unspecified osteoarthritis, unspecified site: Secondary | ICD-10-CM | POA: Diagnosis present

## 2010-10-24 DIAGNOSIS — G8929 Other chronic pain: Secondary | ICD-10-CM | POA: Diagnosis present

## 2010-10-24 DIAGNOSIS — K254 Chronic or unspecified gastric ulcer with hemorrhage: Principal | ICD-10-CM | POA: Diagnosis present

## 2010-10-24 DIAGNOSIS — T39095A Adverse effect of salicylates, initial encounter: Secondary | ICD-10-CM | POA: Diagnosis present

## 2010-10-24 DIAGNOSIS — D62 Acute posthemorrhagic anemia: Secondary | ICD-10-CM | POA: Diagnosis present

## 2010-10-24 DIAGNOSIS — J309 Allergic rhinitis, unspecified: Secondary | ICD-10-CM | POA: Diagnosis present

## 2010-10-24 DIAGNOSIS — K921 Melena: Secondary | ICD-10-CM

## 2010-10-24 DIAGNOSIS — R000 Tachycardia, unspecified: Secondary | ICD-10-CM

## 2010-10-24 DIAGNOSIS — E876 Hypokalemia: Secondary | ICD-10-CM | POA: Diagnosis not present

## 2010-10-24 DIAGNOSIS — M25579 Pain in unspecified ankle and joints of unspecified foot: Secondary | ICD-10-CM | POA: Diagnosis present

## 2010-10-24 DIAGNOSIS — Z882 Allergy status to sulfonamides status: Secondary | ICD-10-CM

## 2010-10-24 DIAGNOSIS — R231 Pallor: Secondary | ICD-10-CM

## 2010-10-24 LAB — DIFFERENTIAL
Basophils Absolute: 0 10*3/uL (ref 0.0–0.1)
Eosinophils Relative: 1 % (ref 0–5)
Lymphocytes Relative: 26 % (ref 12–46)
Monocytes Absolute: 0.6 10*3/uL (ref 0.1–1.0)

## 2010-10-24 LAB — COMPREHENSIVE METABOLIC PANEL
ALT: 14 U/L (ref 0–35)
AST: 15 U/L (ref 0–37)
Albumin: 3.3 g/dL — ABNORMAL LOW (ref 3.5–5.2)
Alkaline Phosphatase: 46 U/L (ref 39–117)
Chloride: 105 mEq/L (ref 96–112)
Potassium: 4.6 mEq/L (ref 3.5–5.1)
Total Bilirubin: 0.2 mg/dL — ABNORMAL LOW (ref 0.3–1.2)

## 2010-10-24 LAB — CBC
HCT: 20.2 % — ABNORMAL LOW (ref 36.0–46.0)
MCHC: 35.6 g/dL (ref 30.0–36.0)
MCV: 89.4 fL (ref 78.0–100.0)
RDW: 12.8 % (ref 11.5–15.5)

## 2010-10-24 LAB — URINALYSIS, ROUTINE W REFLEX MICROSCOPIC
Bilirubin Urine: NEGATIVE
Hgb urine dipstick: NEGATIVE
Specific Gravity, Urine: 1.019 (ref 1.005–1.030)
pH: 6 (ref 5.0–8.0)

## 2010-10-24 LAB — PROTIME-INR: INR: 1.05 (ref 0.00–1.49)

## 2010-10-24 LAB — CK TOTAL AND CKMB (NOT AT ARMC): Relative Index: INVALID (ref 0.0–2.5)

## 2010-10-24 LAB — OCCULT BLOOD, POC DEVICE: Fecal Occult Bld: POSITIVE

## 2010-10-24 LAB — TROPONIN I: Troponin I: <0.3 ng/mL (ref <0.30)

## 2010-10-24 NOTE — Patient Instructions (Signed)
Go straight to [facility] ER ; I am concerned you have  Gastritis or bleeding ulcer

## 2010-10-24 NOTE — Progress Notes (Signed)
  Subjective:    Patient ID: Cynthia [name], female    DOB: May 05, 1946, 65 y.o.   MRN: [id number]  HPI Dizziness Onset:10/23/2010 "like a panic  attack" in church Context:sitting in pew Position change:not related to kneeling or standing up Benign positional vertigo symptoms:no Straining:no Pain:no Cardiac prodrome: Palpitations, irregular rhythm, heart rate change:/ racing Neurologic prodrome: headache, numbness and tingling ( beyond chronic symptoms from spinal Stenosis LLE), weakness, change in coordination (gait/falling): no Syncope:no but near syncope Seizure activity:no Upper respiratory tract infection/extrinsic symptoms:  nasal congestion/obstruction; nasal purulence; facial pain; anosmia; fatigue; fever; headache;  earache and dental pain. Duration of symptoms  as well as treatment to date were verified Duration: hours Frequency: essentially constant Associated signs and symptoms: Visual change (blurred/double/loss):no Hearing loss/tinnitus:no Nausea/vomiting: nausea only Chest pain:chest pressure 06/10 afternoon & eve, none today Dyspnea:some Treatment/response:bedrest, dramamine  X2 with bnefit      Review of Systems ? Eczema rash. No extrinsic symptoms. Melena this am. Intermittent L abdominal pain.Ibuprofen 600 mg up to 6 /day in past 6 months due to foot/ankle freacture    Objective:   Physical Exam   Gen. appearance: Well-nourished, in no distress but choosing to lie supine. Appears  pale  Eyes: Extraocular motion intact, field of vision normal,  no nystagmus but dizziness with EOM ENT: Canals clear, tympanic membranes normal,  hearing grossly normal Neck: Normal range of motion, no masses, normal thyroid Cardiovascular:  Tachycardia , regular  rhythm normal; no murmurs or  extra heart sounds S4 gallop Abdomen: bowel sounds normal, soft and non-tender without masses, organomegaly or hernias noted.  No guarding or rebound . Rectal: frankly melenous stool , 4+  hemoccult  All pulses intact without  bruits .No ischemic skin changes.   Muscle skeletal: Range of motion, tone, &  strength normal Neuro:no cranial nerve deficit, deep tendon  reflexes normal, gait unsteady Lymph: No cervical or axillary LA Skin: Warm and dry without suspicious lesions or rashes. Pallor  Psych: no anxiety ;flat affect. Normally interactive and cooperative.         Assessment & Plan:  #1 dizziness  #2 tachycardia #3 pallor #4 melena  #5 chest discomfort Plan: EKG : ST with diffuse NS ST-T changes. To [facility] for GI bleeding with associated tachycardia .

## 2010-10-25 ENCOUNTER — Encounter (HOSPITAL_COMMUNITY): Payer: Self-pay | Admitting: Radiology

## 2010-10-25 ENCOUNTER — Other Ambulatory Visit: Payer: Self-pay | Admitting: Gastroenterology

## 2010-10-25 DIAGNOSIS — K921 Melena: Secondary | ICD-10-CM

## 2010-10-25 DIAGNOSIS — K259 Gastric ulcer, unspecified as acute or chronic, without hemorrhage or perforation: Secondary | ICD-10-CM

## 2010-10-25 DIAGNOSIS — D62 Acute posthemorrhagic anemia: Secondary | ICD-10-CM

## 2010-10-25 LAB — CBC
HCT: 25.4 % — ABNORMAL LOW (ref 36.0–46.0)
HCT: 22.1 % — ABNORMAL LOW (ref 36.0–46.0)
Hemoglobin: 6.2 g/dL — CL (ref 12.0–15.0)
Hemoglobin: 9.1 g/dL — ABNORMAL LOW (ref 12.0–15.0)
MCH: 31.6 pg (ref 26.0–34.0)
MCH: 32 pg (ref 26.0–34.0)
MCHC: 35.4 g/dL (ref 30.0–36.0)
MCHC: 35.7 g/dL (ref 30.0–36.0)
MCV: 89.4 fL (ref 78.0–100.0)
Platelets: 204 10*3/uL (ref 150–400)
Platelets: 202 10*3/uL (ref 150–400)
Platelets: 213 10*3/uL (ref 150–400)
RBC: 1.96 MIL/uL — ABNORMAL LOW (ref 3.87–5.11)
RBC: 2.84 MIL/uL — ABNORMAL LOW (ref 3.87–5.11)
RDW: 13.4 % (ref 11.5–15.5)
WBC: 7.3 10*3/uL (ref 4.0–10.5)
WBC: 7.7 10*3/uL (ref 4.0–10.5)

## 2010-10-25 LAB — COMPREHENSIVE METABOLIC PANEL
AST: 17 U/L (ref 0–37)
BUN: 13 mg/dL (ref 6–23)
CO2: 21 mEq/L (ref 19–32)
Calcium: 8 mg/dL — ABNORMAL LOW (ref 8.4–10.5)
Chloride: 106 mEq/L (ref 96–112)
Creatinine, Ser: 0.57 mg/dL (ref 0.4–1.2)
GFR calc Af Amer: >60 mL/min (ref >60)
GFR calc non Af Amer: >60 mL/min (ref >60)
Glucose, Bld: 94 mg/dL (ref 70–99)
Total Bilirubin: 0.3 mg/dL (ref 0.3–1.2)

## 2010-10-25 LAB — ABO/RH: ABO/RH(D): O POS

## 2010-10-25 LAB — URINE CULTURE: Colony Count: NO GROWTH

## 2010-10-25 LAB — GLUCOSE, CAPILLARY
Glucose-Capillary: 84 mg/dL (ref 70–99)
Glucose-Capillary: 98 mg/dL (ref 70–99)

## 2010-10-25 LAB — MRSA PCR SCREENING: MRSA by PCR: NEGATIVE

## 2010-10-25 MED ORDER — IOHEXOL 300 MG/ML  SOLN
100.0000 mL | Freq: Once | INTRAMUSCULAR | Status: AC | PRN
  Administered 2010-10-25: 100 mL via INTRAVENOUS

## 2010-10-26 DIAGNOSIS — K250 Acute gastric ulcer with hemorrhage: Secondary | ICD-10-CM

## 2010-10-26 LAB — CBC
HCT: 25.7 % — ABNORMAL LOW (ref 36.0–46.0)
HCT: 29.5 % — ABNORMAL LOW (ref 36.0–46.0)
Hemoglobin: 10.1 g/dL — ABNORMAL LOW (ref 12.0–15.0)
Hemoglobin: 8.6 g/dL — ABNORMAL LOW (ref 12.0–15.0)
Hemoglobin: 9 g/dL — ABNORMAL LOW (ref 12.0–15.0)
MCH: 31.1 pg (ref 26.0–34.0)
MCHC: 35 g/dL (ref 30.0–36.0)
MCHC: 34.2 g/dL (ref 30.0–36.0)
MCV: 89.1 fL (ref 78.0–100.0)
MCV: 89.9 fL (ref 78.0–100.0)
Platelets: 195 10*3/uL (ref 150–400)
RBC: 2.75 MIL/uL — ABNORMAL LOW (ref 3.87–5.11)
WBC: 6 10*3/uL (ref 4.0–10.5)
WBC: 7.5 10*3/uL (ref 4.0–10.5)

## 2010-10-26 LAB — BASIC METABOLIC PANEL
BUN: 7 mg/dL (ref 6–23)
Chloride: 111 mEq/L (ref 96–112)
Creatinine, Ser: 0.55 mg/dL (ref 0.4–1.2)
Glucose, Bld: 93 mg/dL (ref 70–99)
Potassium: 4.9 mEq/L (ref 3.5–5.1)

## 2010-10-27 LAB — CROSSMATCH
ABO/RH(D): O POS
Antibody Screen: NEGATIVE
Unit division: 0
Unit division: 0

## 2010-11-01 NOTE — Discharge Summary (Signed)
NAMEMarland Kitchen  Cynthia, [name] NO.:  [id number]  MEDICAL RECORD NO.:  [id number]  LOCATION:  2604                         FACILITY:  [facility]  PHYSICIAN:  Lonia Blood, M.D.DATE OF BIRTH:  [birth year]  DATE OF ADMISSION:  10/24/2010 DATE OF DISCHARGE:  10/26/2010                              DISCHARGE SUMMARY   PRIMARY CARE PHYSICIAN:  Willow Ora, MD  GI PHYSICIAN:  Barbette Hair. Arlyce Dice, MD, [facility]  DISCHARGE DIAGNOSES: 1. Clean based gastric ulcers, as diagnosed via EGD.     a.     Nonsteroidal anti-inflammatory drug related.     b.     Proton pump inhibitor x2 weeks and then daily thereafter.     c.     Discontinue NSAIDs until further advised by physician.     d.     Helicobacter pylori biopsies pending at the time of      discharge-to be followed up by GI physician. 2. Acute blood loss anemia secondary to above.     a.     Hemoglobin nadir of 6.2.     b.     Significant melena.     c.     Hemoglobin stable at around 9.0 at the time of discharge. 3. Allergic rhinitis. 4. Chronic low back pain. 5. Degenerative joint disease. 6. Chronic severe ankle pain with prior history of fracture. 7. Osteoporosis. 8. Status post C-section. 9. Allergy to sulfa medications.  DISCHARGE MEDICATIONS:  A complete list of the patient's discharge medications is available as per the discharge med manager portion of the patient's E-chart computer file.  Of note, the patient is advised to discontinue on nonsteroidals until further advised.  Additionally, she is discharged on Protonix 40 mg b.i.d. for 15 days; thereafter, did change to 40 mg daily.  She is also provided a prescription for Vicodin 5/325 one to two tablets q.4 h. as needed for pain with #50 and no refills.  Her other chronic medications are without change.  FOLLOWUP: 1. The patient is advised to follow up Dr. Willow Ora in 7-10 days.  At     that time, a CBC should be assessed to assure the patient's     hemoglobin is  stable.  Hemoglobin at discharge is approximately     9.0.  Additionally, long-term pain management and strategies for     the patient's significant orthopedic type pain should be discussed     at that followup visit. 2. The GI team will follow up on the patient's H. pylori biopsy.  If     this is positive, they will contact the patient to arrange for     antibiotic therapy.  Otherwise, no specific GI followup is     recommended by the GI service at the time of the patient's     discharge. 3. The patient is advised to return immediately to the emergency room     should she develop presyncope, syncope, severe dizziness, or     unrelenting melanotic stools that persists beyond 48 hours or if     she develops bright red blood per rectum.  CONSULTATIONS:  Barbette Hair. Arlyce Dice, MD, [facility] with [facility].  PROCEDURES: 1. EGD, on October 25, 2010 - multiple ulcers in the antrum with     otherwise normal exam.  An H. pylori biopsy is pending. 2. CT scan of the abdomen and pelvis October 25, 2010 - no evidence of     acute appendicitis or bowel obstruction.  No abscess or other acute     abnormalities.  HOSPITAL COURSE:  Cynthia [name] is a very pleasant 65 year old female with a prior history of an ankle fracture and known history of severe degenerative disk disease and chronic low back pain.  She admits to taking "a lot of ibuprofen" for her chronic pain.  She states that she would take Avapro 4-5 times a day.  She presented to the hospital with a history of black tarry stools.  She reports 1-2 episodes a day for at least the past 3-4 days prior to her admission.  At the time of her admission, she was found to be anemic with initial blood count of 7.2.  CT scan of abdomen and pelvis was accomplished and was unrevealing.  Stools were in fact consistent with melena and were confirmed to be guaiac positive.  The patient was admitted to Acute Unit with hydration.  Her hemoglobin reached a nadir of  6.2.  GI was consulted.  The patient was taken to the endoscopy suite and an EGD was accomplished with results as noted above.  The patient has been advised to avoid any further nonsteroidals at this time.  She has been transitioned from a Protonix drip to oral Protonix on b.i.d. basis.  She will continue this for 2 additional weeks and then transitioned daily. As noted above, an H. pylori biopsy was accomplished and it is pending at the time of the discharge.  The GI service is indicated and they will follow up on this.  The patient's hemoglobin improved to be stable at approximately 9.0.  The patient was felt to be stable for discharge. Her vital signs were stable and she was afebrile.  She was not orthostatic.  She was able to tolerate a diet without any difficulty.     Lonia Blood, M.D.     JTM/MEDQ  D:  10/26/2010  T:  10/26/2010  Job:  [id number]  cc:   Willow Ora, MD Barbette Hair. Arlyce Dice, MD,FACG  Electronically Signed by Jetty Duhamel M.D. on 11/01/2010 12:12:00 PM

## 2010-11-07 NOTE — H&P (Signed)
NAME:  Cynthia [name], Cynthia [name] NO.:  [id number]  MEDICAL RECORD NO.:  [id number]  LOCATION:  [facility]                         FACILITY:  [facility]  PHYSICIAN:  Eduard Clos, MDDATE OF BIRTH:  1945-10-10  DATE OF ADMISSION:  10/24/2010 DATE OF DISCHARGE:                             HISTORY & PHYSICAL   PRIMARY CARE PHYSICIAN:  Willow Ora, MD  CHIEF COMPLAINT:  Dizziness, abdominal pain, and diarrhea.  HISTORY OF PRESENTING ILLNESS:  A 65 year old female with known history of low back pain and allergic rhinitis, has been experiencing some dizziness over the last 3-4 days along with the patient also has been having black tarry stools and some abdominal discomfort, mostly in the lower quadrant.  The patient became very fatigued and called PCP's office, who has advised her to come to the ER.  In the ER, the patient was found to have hemoglobin around 7.2, guaiac stool is positive.  The patient has been admitted for GI bleed.  The patient states she takes a lot of ibuprofen, but she has lower extremity and low back pain, at least 4-5 times a day.  The patient denies any chest pain, shortness of breath.  Denies any vomiting, though had some nausea, has had some diarrhea.  The stools are usually black in color, had at least 1 or 2 episodes a day over the last 3 days and abdominal pain is mostly in the lower quadrant, dull aching, it is coming off and on.  Denies any fever or chills, any loss of consciousness, or any focal deficits.  Denies any cough or phlegm, dysuria, discharges.  PAST MEDICAL HISTORY:  Allergic rhinitis, low back pain, degenerative joint disease, herniated disk, osteoporosis.  PAST SURGICAL HISTORY:  C-section.  MEDICATIONS PRIOR TO ADMISSION:  Has to be verified includes, she takes ibuprofen 600 mg at least 4-5 times a day, Robaxin, Ultram, Valtrex, she states she had taken in April 2012, but she has stopped.  ALLERGIES:  SULFA.  SOCIAL HISTORY:   The patient denies smoking cigarettes, drinks wine at least 3 times a week.  Denies any drug abuse.  Married, lives with her husband.  FAMILY HISTORY:  Negative for any colon cancer.  REVIEW OF SYSTEMS:  As per the history of presenting illness, nothing else significant.  PHYSICAL EXAMINATION:  GENERAL:  The patient was examined at the bedside, not in acute distress. VITAL SIGNS:  Blood pressure is 96/44, pulse is 90 per minute, temperature 99, respirations 18 per minute, O2 sat 99%. HEENT:  Anicteric.  Pallor is present.  No facial asymmetry.  Tongue is midline. NECK:  No neck rigidity. RESPIRATORY:  Bilateral air entry present.  No rhonchi, no crepitation. HEART:  S1 and S2 heard. ABDOMEN:  Soft, nontender.  Bowel sounds heard.  No guarding or rigidity. CNS:  The patient is alert, awake, oriented to time, place, and person. Moves up and lower extremities, 5/5. EXTREMITIES:  Peripheral pulses felt.  No edema.  LABS:  CBC; WBC 7.2, hemoglobin 7.2, hematocrit 20.2, platelets 244. PT/INR 13.9/1.  Complete metabolic panel; sodium 136, potassium 4.6, chloride 105, carbon dioxide 23, glucose 95, BUN 26, creatinine 0.6. Total bilirubin is 0.2, alkaline phosphatase  is 46, AST 15, ALT 14, total protein 5.7, albumin 3.3, calcium 8.2, lactic acid 1.7, lipase 33. CK is 26, CK-MB is 1.4, troponin less than 0.3.  UA is negative for nitrates and leukocytes.  Fecal occult blood is positive.  ASSESSMENT: 1. Acute gastrointestinal bleed. 2. Anemia secondary to estimated blood loss. 3. NSAIDs use. 4. Dizziness, probably from #1 reason. 5. Abdominal pain.  PLAN: 1. At this time, admit the patient to step-down unit. 2. For her GI bleed, at this time most likely cause is use of NSAIDs     which could be causing her upper GI bleed given the fact the     patient has tarry stools.  We are going to keep the patient n.p.o.,     Protonix drip.  I am going to repeat CBC stat, transfuse as needed.      Type and crossmatch 4 units PRBC to hold, get a GI consult.  As the     patient also has vague abdominal pain, I am going to get a CT     abdomen and pelvis with contrast.  The patient has mild temperature     increased, for which I am going to keep the patient on Cipro and     Flagyl until the CAT scan does not show any definite colitis.  We     will also check stool for Clostridium difficile PCR.  The patient     has not used any antibiotics recently.  Further recommendation as     condition evolves.     Eduard Clos, MD     ANK/MEDQ  D:  10/25/2010  T:  10/25/2010  Job:  [id number]  cc:   Willow Ora, MD  Electronically Signed by Midge Minium MD on 11/07/2010 07:31:34 AM

## 2010-11-08 ENCOUNTER — Encounter: Payer: Self-pay | Admitting: Internal Medicine

## 2010-11-08 ENCOUNTER — Ambulatory Visit (INDEPENDENT_AMBULATORY_CARE_PROVIDER_SITE_OTHER): Payer: Medicare Other | Admitting: Internal Medicine

## 2010-11-08 VITALS — BP 114/74 | HR 81 | Wt 159.4 lb

## 2010-11-08 DIAGNOSIS — K279 Peptic ulcer, site unspecified, unspecified as acute or chronic, without hemorrhage or perforation: Secondary | ICD-10-CM

## 2010-11-08 DIAGNOSIS — R21 Rash and other nonspecific skin eruption: Secondary | ICD-10-CM

## 2010-11-08 DIAGNOSIS — M1990 Unspecified osteoarthritis, unspecified site: Secondary | ICD-10-CM

## 2010-11-08 MED ORDER — HYDROCODONE-ACETAMINOPHEN 5-325 MG PO TABS: ORAL_TABLET | ORAL | Status: DC

## 2010-11-08 MED ORDER — PREDNISOLONE ACETATE 1 % OP SUSP: 2.0000 [drp] | Freq: Two times a day (BID) | OPHTHALMIC | Status: AC

## 2010-11-08 NOTE — Assessment & Plan Note (Addendum)
Recovering from a upper GI bleed induced by Motrin. Continue with PPIs for now. Recommend to avoid Motrin and any other NSAID. At some point, I think it will be okay to take aspirin 81 mg if needed. Labs

## 2010-11-08 NOTE — Progress Notes (Signed)
  Subjective:    Patient ID: Cynthia [name], female    DOB: Jul 09, 1945, 65 y.o.   MRN: [id number]  HPI Hospital followup, admitted for 2 days on 10-24-10  DISCHARGE DIAGNOSES:   1. Clean based gastric ulcers, as diagnosed via EGD.       a.     Nonsteroidal anti-inflammatory drug related.       b.     Proton pump inhibitor x2 weeks and then daily thereafter.       c.     Discontinue NSAIDs until further advised by physician.       d.     Helicobacter pylori biopsies pending at the time of        discharge-to be followed up by GI physician.   2. Acute blood loss anemia secondary to above.       a.     Hemoglobin nadir of 6.2.       b.     Significant melena.       c.     Hemoglobin stable at around 9.0 at the time of discharge.   3. Allergic rhinitis.   4. Chronic low back pain.   5. Degenerative joint disease.   6. Chronic severe ankle pain with prior history of fracture.   7. Osteoporosis.   8. Status post C-section.   9. Allergy to sulfa medications.    Last hemoglobin on June 13 was 10.1.  BMP was normal. CT of the abdomen and pelvis showed no acute findings Past Medical History  Diagnosis Date  . Depression   . Constipation, chronic   . Headache   . Emphysema     chest xray showed, PFTs normal 2009  . Osteoporosis   . DEGENERATIVE JOINT DISEASE 06/02/2010  . PUD (peptic ulcer disease)     Upper GI bleed d/t NSAIDs    Past Surgical History  Procedure Date  . Cesarean section   . Ankle fracture surgery     surgery 12-11 (LEFT)     Review of Systems Since she left the hospital she is doing okay. No vomiting, diarrhea. One day she had headache and nausea, she thinks related to migraines. Appetite is normal No abdominal pain, no further change in the color of the stools. Good compliance with PPIs. Additionally, has developed rash in the upper eyelids, area is slightly itchy.     Objective:   Physical Exam  Constitutional: She appears well-developed and  well-nourished. No distress.  HENT:  Head: Normocephalic and atraumatic.  Eyes: No scleral icterus.  Cardiovascular: Normal rate, regular rhythm and normal heart sounds.   No murmur heard. Pulmonary/Chest: Effort normal and breath sounds normal. No respiratory distress. She has no wheezes. She has no rales.  Abdominal: Soft. Bowel sounds are normal. She exhibits no distension. There is no tenderness. There is no rebound.  Musculoskeletal: She exhibits no edema.  Skin: She is not diaphoretic.          Assessment & Plan:

## 2010-11-08 NOTE — Assessment & Plan Note (Signed)
The patient complained of a rash in the eye lids, exam show the eyelids to be slightly red, no warm or scaly. Allergy?. Prescribed prednisolone opht.to be used on the skin only and  x 10 days

## 2010-11-08 NOTE — Patient Instructions (Signed)
Tylenol 500 mg 2 tablets every 6 hours as needed. If the pain is more intense, you can take Vicodin, see  prescription.

## 2010-11-08 NOTE — Assessment & Plan Note (Addendum)
Unable to take Motrin anymore due to the GI bleed. She has been doing okay with Vicodin at night. Okay to take Tylenol  And vicodin 5 mg ---see Rx

## 2010-11-09 LAB — CBC WITH DIFFERENTIAL/PLATELET
Basophils Absolute: 0 10*3/uL (ref 0.0–0.1)
Basophils Relative: 0.6 % (ref 0.0–3.0)
Eosinophils Absolute: 0.1 10*3/uL (ref 0.0–0.7)
MCHC: 34.1 g/dL (ref 30.0–36.0)
MCV: 94.2 fl (ref 78.0–100.0)
Monocytes Absolute: 0.7 10*3/uL (ref 0.1–1.0)
Neutrophils Relative %: 52.9 % (ref 43.0–77.0)
Platelets: 381 10*3/uL (ref 150.0–400.0)
RDW: 13.9 % (ref 11.5–14.6)

## 2010-11-09 LAB — FERRITIN: Ferritin: 10.3 ng/mL (ref 10.0–291.0)

## 2010-11-09 LAB — IRON: Iron: 42 ug/dL (ref 42–145)

## 2010-11-11 ENCOUNTER — Telehealth: Payer: Self-pay | Admitting: *Deleted

## 2010-11-11 NOTE — Telephone Encounter (Signed)
Pt is aware.  

## 2010-11-11 NOTE — Telephone Encounter (Signed)
Message copied by Leanne Lovely on Fri Nov 11, 2010  9:51 AM ------      Message from: Willow Ora E      Created: Fri Nov 11, 2010  6:43 AM       Iron ok but Hg still slt low:      Advise pt to take OTC iron-multivitamin daily x 3 months       Needs to take on a empty stomach at noon (away from PPIs )

## 2010-11-11 NOTE — Telephone Encounter (Signed)
Message left for patient to return my call.  

## 2010-11-29 ENCOUNTER — Encounter: Payer: Medicare Other | Admitting: Internal Medicine

## 2011-02-18 ENCOUNTER — Other Ambulatory Visit: Payer: Self-pay | Admitting: Internal Medicine

## 2011-02-20 NOTE — Telephone Encounter (Signed)
Celexa request [last refill 06/03/10 #90x1]

## 2011-02-21 NOTE — Telephone Encounter (Signed)
Ok 90 and 1 RF 

## 2011-03-13 ENCOUNTER — Ambulatory Visit (INDEPENDENT_AMBULATORY_CARE_PROVIDER_SITE_OTHER): Payer: Medicare Other | Admitting: Internal Medicine

## 2011-03-13 ENCOUNTER — Encounter: Payer: Self-pay | Admitting: Internal Medicine

## 2011-03-13 DIAGNOSIS — K279 Peptic ulcer, site unspecified, unspecified as acute or chronic, without hemorrhage or perforation: Secondary | ICD-10-CM

## 2011-03-13 DIAGNOSIS — Z0000 Encounter for general adult medical examination without abnormal findings: Secondary | ICD-10-CM

## 2011-03-13 DIAGNOSIS — E785 Hyperlipidemia, unspecified: Secondary | ICD-10-CM

## 2011-03-13 DIAGNOSIS — R51 Headache: Secondary | ICD-10-CM

## 2011-03-13 DIAGNOSIS — K5909 Other constipation: Secondary | ICD-10-CM

## 2011-03-13 DIAGNOSIS — F329 Major depressive disorder, single episode, unspecified: Secondary | ICD-10-CM

## 2011-03-13 DIAGNOSIS — Z23 Encounter for immunization: Secondary | ICD-10-CM

## 2011-03-13 LAB — LIPID PANEL: Triglycerides: 67 mg/dL (ref 0.0–149.0)

## 2011-03-13 LAB — CBC WITH DIFFERENTIAL/PLATELET
Basophils Absolute: 0.1 10*3/uL (ref 0.0–0.1)
Basophils Relative: 1.3 % (ref 0.0–3.0)
Eosinophils Absolute: 0.2 10*3/uL (ref 0.0–0.7)
MCHC: 34.1 g/dL (ref 30.0–36.0)
MCV: 93.8 fl (ref 78.0–100.0)
Monocytes Absolute: 0.5 10*3/uL (ref 0.1–1.0)
Neutrophils Relative %: 64.4 % (ref 43.0–77.0)
Platelets: 315 10*3/uL (ref 150.0–400.0)
RBC: 4.01 Mil/uL (ref 3.87–5.11)
RDW: 14.5 % (ref 11.5–14.6)

## 2011-03-13 LAB — FERRITIN: Ferritin: 20.6 ng/mL (ref 10.0–291.0)

## 2011-03-13 MED ORDER — AZELASTINE HCL 0.15 % NA SOLN: NASAL | Status: DC

## 2011-03-13 NOTE — Assessment & Plan Note (Addendum)
Td 2001 and 2011 shingles shot 2009 pneumonia shot--2011 Colonoscopy: 05/15/2002, Next Due:  05/2012 (per chart review, no scope report found; patient told Cscope  was noraml)   PAP 11-09, 10-2009 neg  ; rec PAPs every 2 or 3 years  MMG 5-12 (-)  , breast exam today, recommend SBE  Diet-exercise discussed

## 2011-03-13 NOTE — Progress Notes (Signed)
Subjective:    Patient ID: Cynthia [name], female    DOB: 12/12/1945, 65 y.o.   MRN: [id number]  HPI  Here for Medicare AWV: 1.Risk factors based on Past M, S, F history: reviewed 2.Physical Activities: water aerobics lately , was limited d/t a foot FX 3.Depression/mood: saw a therapist last year, cost is an issue, still has some depression, no suicidal  4.Hearing: no problems , normal hearing to whispered voice  5.ADL's: totally independent 6.Fall Risk: low risk, no recent falls 7.Home Safety:  Feels safe at home 8.Height, weight, &visual acuity: see vital signs, has pre- glaucoma, sees ophthalmology q 6 months  9. Counseling: see below 10. Labs ordered based on risk factors:  If needed  11. Referral Coordination --if needed 12. Care Plan-- discussed w/ patient plan ofcare for the next 12 months  13.    Cognitive Assessment-- cognition and memory seem within normal  in addition to her physical we assessed the following problems HA-- still occ HA, pt wonders if related to nasal congestiion Constipation -- still has issues if she does'n take OTCs regulalrly  PUD-- completely d/d NSAids , doing well  RLS-- has severeal sx c/w  RLSs although has  never been formally dx    Past Medical History  Diagnosis Date  . Depression   . Constipation, chronic   . Headache   . Emphysema     chest xray showed, PFTs normal 2009  . Osteoporosis   . DEGENERATIVE JOINT DISEASE 06/02/2010  . PUD (peptic ulcer disease)     Upper GI bleed d/t NSAIDs   Past Surgical History  Procedure Date  . Cesarean section   . Ankle fracture surgery     surgery 12-11 (LEFT)   History   Social History  . Marital Status: Married    Spouse Name: N/A    Number of Children: 3  . Years of Education: N/A   Occupational History  . retired VP of [employer]    Social History Main Topics  . Smoking status: Never Smoker   . Smokeless tobacco: Never Used  . Alcohol Use: Yes     socially  . Drug Use: No    . Sexually Active: Not on file   Other Topics Concern  . Not on file   Social History Narrative   2 step children, 1 child---Household- patient and her husband---7 GK----Diet healthy   Family History  Problem Relation Age of Onset  . Aneurysm Mother     brain  . Diabetes      grandfather  . Hypertension Neg Hx   . Cancer Neg Hx   . Coronary artery disease Neg Hx      Review of Systems  Constitutional: Negative for fatigue and unexpected weight change.  Cardiovascular: Negative for chest pain and leg swelling.  Gastrointestinal: Negative for abdominal pain and blood in stool.  Genitourinary: Negative for hematuria, vaginal bleeding and vaginal discharge.       Objective:   Physical Exam  Constitutional: She appears well-developed and well-nourished. No distress.  HENT:  Head: Normocephalic and atraumatic.  Neck: No thyromegaly present.       Normal carotid pulses  Cardiovascular: Normal rate, regular rhythm and normal heart sounds.   No murmur heard. Pulmonary/Chest: Effort normal and breath sounds normal. No respiratory distress. She has no wheezes. She has no rales.  Abdominal: Soft. She exhibits no distension. There is no tenderness. There is no rebound and no guarding.  Genitourinary:  Breast exam normal bilaterally  Musculoskeletal: She exhibits no edema.  Neurological: She is alert.  Skin: She is not diaphoretic.  Psychiatric: She has a normal mood and affect. Her behavior is normal. Judgment and thought content normal.      Assessment & Plan:

## 2011-03-13 NOTE — Assessment & Plan Note (Addendum)
Long history of depression, on Celexa and bupropion for years. Symptoms are not as well controlled as she would desire. States she will talk w/ her counselor , if no better will call and we  will reasses

## 2011-03-13 NOTE — Patient Instructions (Addendum)
Metamucil 2 capsules daily with fluids MiraLax 17 g with fluids daily Stop protonix

## 2011-03-13 NOTE — Assessment & Plan Note (Addendum)
Still complaining of HAs, pt wonders if related to sinus congestion b/c they are worse when she lies down. Has sees neuro before, no specific Rx was given Plan: Trial w/ astepro

## 2011-03-13 NOTE — Assessment & Plan Note (Signed)
Mild hyperlipidemia, on diet only. Labs

## 2011-03-13 NOTE — Assessment & Plan Note (Signed)
Recommend Metamucil and MiraLax, see instructions

## 2011-03-13 NOTE — Assessment & Plan Note (Signed)
last bone density test 3/10,  compared to the scanned  bone density test from 2004: lumbar  T score has definitely  improved hips Tscore has improved slightly base on results from 3-10,she stoped biphosphonates (holyday) Had a ankle Fx 04-2010  Plan: continue ca and vit d Schedule DEXA

## 2011-03-13 NOTE — Assessment & Plan Note (Signed)
Symptoms resolved, she had mild anemia Currently off NSAIDs Plan: Stop PPIs, labs

## 2011-03-14 ENCOUNTER — Telehealth: Payer: Self-pay

## 2011-03-14 NOTE — Telephone Encounter (Signed)
Message copied by Beverely Low on Tue Mar 14, 2011 10:15 AM ------      Message from: Wanda Plump      Created: Mon Mar 13, 2011  6:20 PM       Advise patient:      Anemia resolved, iron okay, she can discontinue her iron supplementation.      Cholesterol has increased, LDL is now 168.5, ideal LDL is 130. I recommend a healthier diet and increase physical activity if possible. We'll recheck in 6 months, we'll consider medication.

## 2011-03-14 NOTE — Telephone Encounter (Signed)
Left message to advise pt of results. Labs mailed

## 2011-03-15 ENCOUNTER — Ambulatory Visit: Payer: Medicare Other

## 2011-04-10 ENCOUNTER — Other Ambulatory Visit: Payer: Self-pay | Admitting: Internal Medicine

## 2011-04-25 ENCOUNTER — Ambulatory Visit (INDEPENDENT_AMBULATORY_CARE_PROVIDER_SITE_OTHER)
Admission: RE | Admit: 2011-04-25 | Discharge: 2011-04-25 | Disposition: A | Discharge status: Home / Self Care | Payer: Medicare Other | Source: Ambulatory Visit

## 2011-04-25 DIAGNOSIS — M810 Age-related osteoporosis without current pathological fracture: Secondary | ICD-10-CM

## 2011-05-05 ENCOUNTER — Other Ambulatory Visit: Payer: Self-pay | Admitting: Internal Medicine

## 2011-06-01 NOTE — Progress Notes (Signed)
Quick Note:  Pt aware ______ 

## 2011-08-08 ENCOUNTER — Other Ambulatory Visit: Payer: Self-pay | Admitting: Internal Medicine

## 2011-08-08 NOTE — Telephone Encounter (Signed)
Refill done.  

## 2011-09-11 ENCOUNTER — Ambulatory Visit (INDEPENDENT_AMBULATORY_CARE_PROVIDER_SITE_OTHER): Payer: Medicare Other | Admitting: Internal Medicine

## 2011-09-11 VITALS — BP 110/70 | HR 75 | Temp 98.2°F | Wt 153.0 lb

## 2011-09-11 DIAGNOSIS — F329 Major depressive disorder, single episode, unspecified: Secondary | ICD-10-CM

## 2011-09-11 DIAGNOSIS — K279 Peptic ulcer, site unspecified, unspecified as acute or chronic, without hemorrhage or perforation: Secondary | ICD-10-CM

## 2011-09-11 DIAGNOSIS — F3289 Other specified depressive episodes: Secondary | ICD-10-CM

## 2011-09-11 DIAGNOSIS — R51 Headache: Secondary | ICD-10-CM

## 2011-09-11 DIAGNOSIS — E785 Hyperlipidemia, unspecified: Secondary | ICD-10-CM

## 2011-09-11 DIAGNOSIS — M1990 Unspecified osteoarthritis, unspecified site: Secondary | ICD-10-CM

## 2011-09-11 NOTE — Assessment & Plan Note (Signed)
Reports good control with current medications, she also sees a psychologist for counseling

## 2011-09-11 NOTE — Assessment & Plan Note (Signed)
Last CBC showed no anemia.

## 2011-09-11 NOTE — Assessment & Plan Note (Addendum)
Increase pain into the last few months. Physical exam show no synovitis, pain most likely from DJD-spinal stenosis. I encouraged her to continue doing chiropractor therapy. Also to take more medications, Vicodin once or twice a day is fine, she has a lower risk to get "hook" which is one of her fears. Also try Robaxin at night. The declined  referral to physical therapy or to Dr Ethelene Hal again Will check a sedimentation rate

## 2011-09-11 NOTE — Patient Instructions (Signed)
Please come back fasting FLP-- dx  high cholesterol Sed rate-- dx DJD

## 2011-09-11 NOTE — Assessment & Plan Note (Signed)
Headaches have improved since she started taking Astelin.

## 2011-09-11 NOTE — Assessment & Plan Note (Addendum)
Since the last cholesterol panel, her diet is slightly better. Repeat labs

## 2011-09-11 NOTE — Progress Notes (Signed)
  Subjective:    Patient ID: Cynthia [name], female    DOB: 12-18-1945, 66 y.o.   MRN: [id number]  HPI Followup from last visit. Since the last time she was here, her cholesterol was slightly elevated, her diet has improved. She has a history of DJD, lately, her symptoms are worse. Reports pain on her hands, right hip, occasional proximal R LE pain   mostly at night. Some neck pain as well. she still has left leg pain worse on "rainy days". She sees a chiropractor regularly which helps to some extent.  Past Medical History  Diagnosis Date  . Depression   . Constipation, chronic   . Headache   . Emphysema     chest xray showed, PFTs normal 2009  . Osteoporosis   . DEGENERATIVE JOINT DISEASE 06/02/2010  . PUD (peptic ulcer disease)     Upper GI bleed d/t NSAIDs   Past Surgical History  Procedure Date  . Cesarean section   . Ankle fracture surgery     surgery 12-11 (LEFT)     Review of Systems Headaches are slightly better when she takes astelin nasal spray Denies fever or chills, no weight loss, no myalgias per se.      Objective:   Physical Exam  General -- alert, well-developed, and well-nourished.    back-- mild tenderness to touch on both sides of the lower back Extremities-- no pretibial edema bilaterally, wrist and hands without evidence of synovitis on exam. Neurologic-- alert & oriented X3 and strength normal in all extremities. DTRs and strength symmetric. Psych-- Cognition and judgment appear intact. Alert and cooperative with normal attention span and concentration.  not anxious appearing and not depressed appearing.       Assessment & Plan:

## 2011-09-14 ENCOUNTER — Other Ambulatory Visit (INDEPENDENT_AMBULATORY_CARE_PROVIDER_SITE_OTHER): Payer: Medicare Other

## 2011-09-14 DIAGNOSIS — E7800 Pure hypercholesterolemia, unspecified: Secondary | ICD-10-CM

## 2011-09-14 DIAGNOSIS — M1990 Unspecified osteoarthritis, unspecified site: Secondary | ICD-10-CM

## 2011-09-14 LAB — LIPID PANEL
HDL: 83.4 mg/dL (ref >39.00)
VLDL: 18.4 mg/dL (ref 0.0–40.0)

## 2011-09-14 LAB — LDL CHOLESTEROL, DIRECT: Direct LDL: 166.1 mg/dL

## 2011-09-14 LAB — SEDIMENTATION RATE: Sed Rate: 12 mm/hr (ref 0–22)

## 2011-09-14 NOTE — Progress Notes (Signed)
Lab only 

## 2011-09-25 MED ORDER — ATORVASTATIN CALCIUM 10 MG PO TABS: 10.0000 mg | ORAL_TABLET | Freq: Every day | ORAL | Status: DC

## 2011-09-25 NOTE — Progress Notes (Signed)
Addended by: Edwena Felty T on: 09/25/2011 02:41 PM   Modules accepted: Orders

## 2011-10-11 ENCOUNTER — Other Ambulatory Visit: Payer: Self-pay | Admitting: Internal Medicine

## 2011-10-11 DIAGNOSIS — Z1231 Encounter for screening mammogram for malignant neoplasm of breast: Secondary | ICD-10-CM

## 2011-10-31 ENCOUNTER — Ambulatory Visit
Admission: RE | Admit: 2011-10-31 | Discharge: 2011-10-31 | Disposition: A | Discharge status: Home / Self Care | Payer: Medicare Other | Source: Ambulatory Visit | Attending: Internal Medicine | Admitting: Internal Medicine

## 2011-10-31 DIAGNOSIS — Z1231 Encounter for screening mammogram for malignant neoplasm of breast: Secondary | ICD-10-CM

## 2011-11-06 ENCOUNTER — Other Ambulatory Visit (INDEPENDENT_AMBULATORY_CARE_PROVIDER_SITE_OTHER): Payer: Medicare Other

## 2011-11-06 DIAGNOSIS — E785 Hyperlipidemia, unspecified: Secondary | ICD-10-CM

## 2011-11-06 LAB — AST: AST: 23 U/L (ref 0–37)

## 2011-11-06 NOTE — Progress Notes (Signed)
Labs only

## 2011-11-10 LAB — LIPID PANEL
Cholesterol: 225 mg/dL — ABNORMAL HIGH (ref 0–200)
Total CHOL/HDL Ratio: 2

## 2011-11-10 LAB — LDL CHOLESTEROL, DIRECT: Direct LDL: 106 mg/dL

## 2011-11-13 ENCOUNTER — Encounter: Payer: Self-pay | Admitting: *Deleted

## 2011-11-28 ENCOUNTER — Other Ambulatory Visit: Payer: Self-pay | Admitting: Internal Medicine

## 2012-01-31 ENCOUNTER — Ambulatory Visit (INDEPENDENT_AMBULATORY_CARE_PROVIDER_SITE_OTHER): Payer: Medicare Other | Admitting: *Deleted

## 2012-01-31 DIAGNOSIS — Z23 Encounter for immunization: Secondary | ICD-10-CM

## 2012-02-04 ENCOUNTER — Ambulatory Visit: Payer: Medicare Other

## 2012-03-12 ENCOUNTER — Encounter: Payer: Self-pay | Admitting: Internal Medicine

## 2012-03-12 ENCOUNTER — Ambulatory Visit (INDEPENDENT_AMBULATORY_CARE_PROVIDER_SITE_OTHER): Payer: Medicare Other | Admitting: Internal Medicine

## 2012-03-12 VITALS — BP 108/72 | HR 67 | Temp 98.2°F | Ht 65.25 in | Wt 153.0 lb

## 2012-03-12 DIAGNOSIS — E785 Hyperlipidemia, unspecified: Secondary | ICD-10-CM

## 2012-03-12 DIAGNOSIS — K5909 Other constipation: Secondary | ICD-10-CM

## 2012-03-12 DIAGNOSIS — M949 Disorder of cartilage, unspecified: Secondary | ICD-10-CM

## 2012-03-12 DIAGNOSIS — F329 Major depressive disorder, single episode, unspecified: Secondary | ICD-10-CM

## 2012-03-12 DIAGNOSIS — Z862 Personal history of diseases of the blood and blood-forming organs and certain disorders involving the immune mechanism: Secondary | ICD-10-CM

## 2012-03-12 DIAGNOSIS — M1990 Unspecified osteoarthritis, unspecified site: Secondary | ICD-10-CM

## 2012-03-12 DIAGNOSIS — M899 Disorder of bone, unspecified: Secondary | ICD-10-CM

## 2012-03-12 DIAGNOSIS — Z0000 Encounter for general adult medical examination without abnormal findings: Secondary | ICD-10-CM

## 2012-03-12 DIAGNOSIS — M8580 Other specified disorders of bone density and structure, unspecified site: Secondary | ICD-10-CM

## 2012-03-12 DIAGNOSIS — R51 Headache: Secondary | ICD-10-CM

## 2012-03-12 LAB — LDL CHOLESTEROL, DIRECT: Direct LDL: 195.5 mg/dL

## 2012-03-12 LAB — CBC WITH DIFFERENTIAL/PLATELET
Basophils Absolute: 0.1 10*3/uL (ref 0.0–0.1)
Eosinophils Absolute: 0.3 10*3/uL (ref 0.0–0.7)
MCHC: 33.4 g/dL (ref 30.0–36.0)
MCV: 93.4 fl (ref 78.0–100.0)
Monocytes Absolute: 0.5 10*3/uL (ref 0.1–1.0)
Neutrophils Relative %: 54 % (ref 43.0–77.0)
Platelets: 311 10*3/uL (ref 150.0–400.0)

## 2012-03-12 LAB — LIPID PANEL
HDL: 71.6 mg/dL (ref >39.00)
Triglycerides: 97 mg/dL (ref 0.0–149.0)

## 2012-03-12 LAB — TSH: TSH: 1.23 u[IU]/mL (ref 0.35–5.50)

## 2012-03-12 MED ORDER — BUPROPION HCL ER (XL) 300 MG PO TB24: 300.0000 mg | ORAL_TABLET | Freq: Every day | ORAL | Status: DC

## 2012-03-12 MED ORDER — HYDROCODONE-ACETAMINOPHEN 5-325 MG PO TABS: ORAL_TABLET | ORAL | Status: DC

## 2012-03-12 MED ORDER — METHOCARBAMOL 500 MG PO TABS: 500.0000 mg | ORAL_TABLET | Freq: Three times a day (TID) | ORAL | Status: DC | PRN

## 2012-03-12 MED ORDER — CITALOPRAM HYDROBROMIDE 20 MG PO TABS: 20.0000 mg | ORAL_TABLET | Freq: Every day | ORAL | Status: DC

## 2012-03-12 NOTE — Progress Notes (Signed)
Subjective:    Patient ID: Cynthia [name], female    DOB: 25-Dec-1945, 66 y.o.   MRN: [id number]  HPI  Here for Medicare AWV: 1.Risk factors based on Past M, S, F history: reviewed 2.Physical Activities: active at home, stationary bike  3.Depression/mood: good days and bad days; no suicidal ideas ; does not like to increase meds  4.Hearing: no problems noted or reported   5.ADL's: totally independent 6.Fall Risk: low risk, no recent falls, prevention discussed  7.Home Safety:  Feels safe at home 8.Height, weight, &visual acuity: see vital signs, has pre- glaucoma, sees ophthalmology q 6 months   9. Counseling: see below 10. Labs ordered based on risk factors:  If needed   11. Referral Coordination --if needed 12. Care Plan-- discussed w/ patient plan ofcare for the next 12 months   13.    Cognitive Assessment-- cognition and memory seem within normal  in addition to her physical we assessed the following problems High cholesterol, took Lipitor for 5 weeks, developed "severe headaches". Cholesterol did respond well to medication. Chronic constipation, still a mild issue, takes senna from time to time Headaches, well controlled with Astelin as needed  Past Medical History  Diagnosis Date  . Depression   . Constipation, chronic   . Headache   . Emphysema     chest xray showed, PFTs normal 2009  . Osteoporosis   . DEGENERATIVE JOINT DISEASE 06/02/2010  . PUD (peptic ulcer disease) 2012    Upper GI bleed d/t NSAIDs  . RLS (restless legs syndrome)     robaxin prn   Past Surgical History  Procedure Date  . Cesarean section   . Ankle fracture surgery     surgery 12-11 (LEFT)   History   Social History  . Marital Status: Married    Spouse Name: N/A    Number of Children: 3  . Years of Education: N/A   Occupational History  . retired VP of [employer]    Social History Main Topics  . Smoking status: Never Smoker   . Smokeless tobacco: Never Used  . Alcohol Use: Yes       socially  . Drug Use: No  . Sexually Active: Not on file   Other Topics Concern  . Not on file   Social History Narrative   2 step children, 1 child---Household- patient and her husband, he recently stop driving ---7 GK----Diet healthy: trying to eat healthier    Family History  Problem Relation Age of Onset  . Aneurysm Mother     brain  . Diabetes      grandfather  . Hypertension Neg Hx   . Cancer Neg Hx   . Coronary artery disease Neg Hx     Review of Systems No chest pain or shortness of breath No  nausea, vomiting blood in the stools. She has chronic constipation and very seldom has diarrhea.     Objective:   Physical Exam  General -- alert, well-developed, and well-nourished.   Neck --no thyromegaly , normal carotid pulse, no LADs Breasts-- No mass, nodules, thickening, tenderness, bulging, retraction, inflamation, nipple discharge or skin changes noted.  no axillary lymph nodes Lungs -- normal respiratory effort, no intercostal retractions, no accessory muscle use, and normal breath sounds.   Heart-- normal rate, regular rhythm, no murmur, and no gallop.   Abdomen--soft, non-tender, no distention, no masses, no HSM, no guarding, and no rigidity.   Extremities-- no pretibial edema bilaterally Neurologic-- alert & oriented  X3 and strength normal in all extremities. Psych-- Cognition and judgment appear intact. Alert and cooperative with normal attention span and concentration.  not anxious appearing and not depressed appearing.       Assessment & Plan:

## 2012-03-12 NOTE — Assessment & Plan Note (Signed)
takes hydrocodone prn, refill

## 2012-03-12 NOTE — Patient Instructions (Addendum)
MiraLax 17 g once a day with lots of fluids (for constipation)

## 2012-03-12 NOTE — Assessment & Plan Note (Signed)
Still has problem from time to time, recommend MiraLax daily

## 2012-03-12 NOTE — Assessment & Plan Note (Signed)
Good days and bad days, we discussed possibly increase her medication but she declines. She will continue to see a counselor from time to time

## 2012-03-12 NOTE — Assessment & Plan Note (Signed)
Took Lipitor for 5 weeks, cholesterol did improve significantly, self discontinued due to severe myalgias. Labs, diet-exercise discussed

## 2012-03-12 NOTE — Assessment & Plan Note (Signed)
Last bone density test 04/2011, showed osteopenia only Recommend calcium, vitamin D and stay active

## 2012-03-12 NOTE — Assessment & Plan Note (Signed)
Well-controlled with Astelin, nasal spray, when necessary

## 2012-03-12 NOTE — Assessment & Plan Note (Signed)
Td 2001 and 2011 shingles shot 2009 pneumonia shot--2011 Had a flu shot  Colonoscopy: 05/15/2002, Next Due:  05/2012 (per chart review, no scope report found; patient told Cscope  was noraml)   Never had an abnormal PAP, married twice; PAP 11-09, 10-2009 neg  ; rec PAPs every 2 or 3 years; declined a pap this year [facility] 5-13 (-)  , breast exam today  Diet-exercise discussed

## 2012-03-14 MED ORDER — PRAVASTATIN SODIUM 40 MG PO TABS: 40.0000 mg | ORAL_TABLET | Freq: Every day | ORAL | Status: DC

## 2012-03-14 NOTE — Addendum Note (Signed)
Addended by: Edwena Felty T on: 03/14/2012 04:36 PM   Modules accepted: Orders

## 2012-04-22 ENCOUNTER — Telehealth: Payer: Self-pay | Admitting: Internal Medicine

## 2012-04-22 NOTE — Telephone Encounter (Signed)
Patient Information:  Caller Name: Sila  Phone: [phone]  Patient: Cynthia [name], Cynthia [name]  Gender: Female  DOB: [birth year]  Age: 66 Years  PCP: Willow Ora   Symptoms  Reason For Call & Symptoms: cough, chest "fullness", sore throat  Reviewed Health History In EMR: Yes  Reviewed Medications In EMR: Yes  Reviewed Allergies In EMR: Yes  Reviewed Surgeries / Procedures: Yes  Date of Onset of Symptoms: 04/16/2012  Guideline(s) Used:  Cough  Disposition Per Guideline:   See Today or Tomorrow in Office  Reason For Disposition Reached:   Continuous (nonstop) coughing interferes with work or school and no improvement using cough treatment per Care Advice  Advice Given:  N/A  Office Follow Up:  Does the office need to follow up with this patient?: No  Instructions For The Office: N/A  Appointment Scheduled:  04/23/2012 09:45:00 Appointment Scheduled Provider:  Sheliah Hatch  RN Note:  Per protocol, emergent symptoms denied; advised appt within 24 hours.  Appt scheduled 0945 04/23/12 with Dr. Beverely Low. krs/can

## 2012-04-23 ENCOUNTER — Encounter: Payer: Self-pay | Admitting: Family Medicine

## 2012-04-23 ENCOUNTER — Ambulatory Visit (INDEPENDENT_AMBULATORY_CARE_PROVIDER_SITE_OTHER): Payer: Medicare Other | Admitting: Family Medicine

## 2012-04-23 VITALS — BP 118/79 | HR 79 | Temp 98.1°F | Ht 65.25 in | Wt 154.2 lb

## 2012-04-23 DIAGNOSIS — J329 Chronic sinusitis, unspecified: Secondary | ICD-10-CM | POA: Insufficient documentation

## 2012-04-23 MED ORDER — GUAIFENESIN-CODEINE 100-10 MG/5ML PO SYRP: 10.0000 mL | ORAL_SOLUTION | Freq: Three times a day (TID) | ORAL | Status: DC | PRN

## 2012-04-23 MED ORDER — AMOXICILLIN 875 MG PO TABS: 875.0000 mg | ORAL_TABLET | Freq: Two times a day (BID) | ORAL | Status: DC

## 2012-04-23 NOTE — Patient Instructions (Addendum)
Start the Amoxicillin twice daily- take w/ food- for the sinus infection Use the cough syrup as needed- may cause drowsiness Mucinex to thin your chest congestion Drink plenty of fluids! REST! Hang in there! Happy Holidays!!

## 2012-04-23 NOTE — Progress Notes (Signed)
  Subjective:    Patient ID: Cynthia [name], female    DOB: 08-Apr-1946, 66 y.o.   MRN: [id number]  HPI URI- sxs started 6 days ago w/ 'a very tight chest'.  Started OTC Robitussin to loosen chest congestion- some relief.  Then developed sore throat, nasal congestion, facial pain/pressure.  No fevers.  Bilateral ear pain.  Cough is wet but not productive.  No known sick contacts.  No N/V/D- some constipation.   Review of Systems For ROS see HPI     Objective:   Physical Exam  Vitals reviewed. Constitutional: She appears well-developed and well-nourished. No distress.  HENT:  Head: Normocephalic and atraumatic.  Right Ear: Tympanic membrane normal.  Left Ear: Tympanic membrane normal.  Nose: Mucosal edema and rhinorrhea present. Right sinus exhibits maxillary sinus tenderness and frontal sinus tenderness. Left sinus exhibits maxillary sinus tenderness and frontal sinus tenderness.  Mouth/Throat: Uvula is midline and mucous membranes are normal. Posterior oropharyngeal erythema present. No oropharyngeal exudate.  Eyes: Conjunctivae normal and EOM are normal. Pupils are equal, round, and reactive to light.  Neck: Normal range of motion. Neck supple.  Cardiovascular: Normal rate, regular rhythm and normal heart sounds.   Pulmonary/Chest: Effort normal and breath sounds normal. No respiratory distress. She has no wheezes.  Lymphadenopathy:    She has no cervical adenopathy.          Assessment & Plan:

## 2012-04-23 NOTE — Assessment & Plan Note (Signed)
Pt's sxs and PE consistent w/ infxn.  Start abx.  Reviewed supportive care and red flags that should prompt return.  Pt expressed understanding and is in agreement w/ plan.  

## 2012-05-15 DIAGNOSIS — H409 Unspecified glaucoma: Secondary | ICD-10-CM

## 2012-05-15 HISTORY — DX: Unspecified glaucoma: H40.9

## 2012-11-20 ENCOUNTER — Other Ambulatory Visit: Payer: Self-pay | Admitting: Internal Medicine

## 2012-11-20 NOTE — Telephone Encounter (Signed)
Refill done.  

## 2012-12-27 ENCOUNTER — Ambulatory Visit (INDEPENDENT_AMBULATORY_CARE_PROVIDER_SITE_OTHER): Payer: Medicare Other | Admitting: Internal Medicine

## 2012-12-27 ENCOUNTER — Encounter: Payer: Self-pay | Admitting: Internal Medicine

## 2012-12-27 VITALS — BP 130/80 | HR 63 | Temp 98.1°F | Wt 157.4 lb

## 2012-12-27 DIAGNOSIS — R21 Rash and other nonspecific skin eruption: Secondary | ICD-10-CM

## 2012-12-27 DIAGNOSIS — E785 Hyperlipidemia, unspecified: Secondary | ICD-10-CM

## 2012-12-27 MED ORDER — HYDROCORTISONE 2.5 % EX CREA: TOPICAL_CREAM | Freq: Every day | CUTANEOUS | Status: DC | PRN

## 2012-12-27 MED ORDER — KETOCONAZOLE 2 % EX CREA: TOPICAL_CREAM | Freq: Two times a day (BID) | CUTANEOUS | Status: DC

## 2012-12-27 NOTE — Assessment & Plan Note (Signed)
Is on the last FLP and it recommended Pravachol but did not try, she essentially has made her mind that she does not like to try statins again. We talked about diet and exercise and she plans to make some changes. I also talked to her about zetia encouraged her to do her own research. If she is interested to let me know.

## 2012-12-27 NOTE — Progress Notes (Signed)
  Subjective:    Patient ID: Cynthia [name], female    DOB: [birth year], 67 y.o.   MRN: [id number]  HPI Acute  visit 6-8 weeks history of itchy rash in the upper back and neck. One-month history of a bumpy, red rash by the right breast, it was not itching it was actually hurting.  We also took about her cholesterol results, see assessment and plan.  Past Medical History  Diagnosis Date  . Depression   . Constipation, chronic   . Headache(784.0)   . Emphysema     chest xray showed, PFTs normal 2009  . Osteoporosis   . DEGENERATIVE JOINT DISEASE 06/02/2010  . PUD (peptic ulcer disease) 2012    Upper GI bleed d/t NSAIDs  . RLS (restless legs syndrome)     robaxin prn   Past Surgical History  Procedure Laterality Date  . Cesarean section    . Ankle fracture surgery      surgery 12-11 (LEFT)   History   Social History  . Marital Status: Married    Spouse Name: N/A    Number of Children: 3  . Years of Education: N/A   Occupational History  . retired VP of [employer]    Social History Main Topics  . Smoking status: Never Smoker   . Smokeless tobacco: Never Used  . Alcohol Use: Yes     Comment: socially  . Drug Use: No  . Sexual Activity: Not on file   Other Topics Concern  . Not on file   Social History Narrative   2 step children, 1 child , 7 GK   Household- patient and her husband, he recently stop driving      Review of Systems Diet--described as healthy Exercise-- not very  active. Denies fever chills    Objective:   Physical Exam  Constitutional: She appears well-developed and well-nourished. No distress.  Neck:  At the left side of the posterior neck she has a superficial, subcutaneous, 3 mm, mobile, nontender mass  Skin: She is not diaphoretic.     Psychiatric: She has a normal mood and affect. Her behavior is normal. Judgment and thought content normal.      Assessment & Plan:  Rash, Most likely the rash are fungal. See instructions  SQ  small nodule in the neck, benign features, recommend observation.

## 2012-12-27 NOTE — Patient Instructions (Addendum)
nizoral  apply twice a day for 10 days Hydrocortisone apply daily as needed for itching or pain. Call if not better in 2 or 3 weeks --- think about ZETIA --- Next visit in 2 months  for a physical exam  Please make an appointment before you leave the office today (or call few weeks in advance)

## 2012-12-28 ENCOUNTER — Encounter: Payer: Self-pay | Admitting: Internal Medicine

## 2013-01-15 ENCOUNTER — Telehealth: Payer: Self-pay | Admitting: Internal Medicine

## 2013-01-15 DIAGNOSIS — R21 Rash and other nonspecific skin eruption: Secondary | ICD-10-CM

## 2013-01-15 NOTE — Telephone Encounter (Signed)
Patient Information:  Caller Name: Cynthia [name]  Phone: [phone]  Patient: Cynthia [name], Cynthia [name]  Gender: Female  DOB: Jan 29, 1946  Age: 67 Years  PCP: Willow Ora  Office Follow Up:  Does the office need to follow up with this patient?: Yes  Instructions For The Office: Please follow up with patient about further treatment beyond care advice given.  Niorzal not Cassell Clement is spreading.  RN Note:  Ashby Dawes of rash and itching/pain unchanged from when Dr. Drue Novel saw her. It has just spread as noted above.  Please follow up with patient with further care advice.  Patient does note that they Hydrocortisone does give temporary relief.  Symptoms  Reason For Call & Symptoms: Rash is not better and has spread to left breast from right breast.  Also notes some has appeared between buttocks.  Reviewed Health History In EMR: Yes  Reviewed Medications In EMR: Yes  Reviewed Allergies In EMR: Yes  Reviewed Surgeries / Procedures: Yes  Date of Onset of Symptoms: 01/10/2013  Treatments Tried: Nirozal not helping  Treatments Tried Worked: No  Guideline(s) Used:  Rash or Redness - Localized  Disposition Per Guideline:   See Within 2 Weeks in Office  Reason For Disposition Reached:   Red, moist, irritated area between skin folds (or under larger breasts)  Advice Given:  Apply Cold to the Area:  Apply or soak in cold water for 20 minutes every 3 to 4 hours to reduce itching or pain.  Avoid Soap:  Wash the area once thoroughly with soap to remove any remaining irritants. Thereafter avoid soaps to this area. Cleanse the area when needed with warm water.  Avoid the Cause:   Try to find the cause. Consider irritants like a plant (e.g., poison ivy or evergreens), chemicals (e.g., solvents or insecticides), fiberglass, a new cosmetic, or new jewelry (called contact dermatitis). A pet may be carrying the irritating substance (e.g., with poison ivy or poison oak).  Hydrocortisone Cream for Itching:   Keep the  cream in the refrigerator (Reason: it feels better if applied cold).  Avoid Scratching:  Try not to scratch. Cut your fingernails short.  Contagiousness:  Adults with localized rashes do not need to miss any work or school.  Call Back If:  Rash spreads or becomes worse  You become worse.  Patient Will Follow Care Advice:  YES

## 2013-01-15 NOTE — Telephone Encounter (Signed)
I will see her in few days however a dermatology referral is appropritae. Please arrange

## 2013-01-15 NOTE — Telephone Encounter (Signed)
Referral placed.

## 2013-01-15 NOTE — Telephone Encounter (Signed)
Spoke with pt states that the rash is now under both breasts. Medication is used up and did not resolve the issue at all. Pt scheduled for your first available appt on Monday 01/20/2013. However she would like to know if there is anything else she can use until the appt?

## 2013-01-20 ENCOUNTER — Ambulatory Visit: Payer: Medicare FFS | Admitting: Internal Medicine

## 2013-02-11 ENCOUNTER — Telehealth: Payer: Self-pay | Admitting: Internal Medicine

## 2013-02-11 NOTE — Telephone Encounter (Signed)
Patient called about receiving a bill for a No Show fee on 01/20/2013. Patient states that she does not remember scheduling an appointment to see [place]. Patient would like to speak to a manager about this situation. Patient is aware that she will probably not receive a phone call back for a week or so. Thanks

## 2013-02-12 ENCOUNTER — Ambulatory Visit: Payer: Medicare FFS

## 2013-02-24 NOTE — Telephone Encounter (Signed)
Request send to [employer] to refund No Show fee.  Please call patient and advise. Thanks, Harriett Sine

## 2013-03-06 ENCOUNTER — Ambulatory Visit (INDEPENDENT_AMBULATORY_CARE_PROVIDER_SITE_OTHER): Payer: Medicare FFS | Admitting: General Practice

## 2013-03-06 DIAGNOSIS — Z23 Encounter for immunization: Secondary | ICD-10-CM

## 2013-03-12 ENCOUNTER — Telehealth: Payer: Self-pay

## 2013-03-12 NOTE — Telephone Encounter (Addendum)
Unable to talk will Call Patient back tomorrow.   Medication and allergies: reviewed and updated  90 day supply/mail order: na Local pharmacy: [facility] [address]   Immunizations due:  UTD  A/P:   Td 2011--next 2021  shingles shot 2009  pneumonia shot--2011 Flu vaccine 02/2013 CCS: 05/15/2002, Past Due: 05/2012--Refer for CCS per patient No changed to FH or PSH  To Discuss with Provider: Rash under right breast--still having problems--went to derm Left foot numbness

## 2013-03-14 ENCOUNTER — Ambulatory Visit (INDEPENDENT_AMBULATORY_CARE_PROVIDER_SITE_OTHER): Payer: Medicare FFS | Admitting: Internal Medicine

## 2013-03-14 ENCOUNTER — Encounter: Payer: Self-pay | Admitting: Internal Medicine

## 2013-03-14 VITALS — BP 113/78 | HR 70 | Temp 98.4°F | Ht 65.8 in | Wt 156.0 lb

## 2013-03-14 DIAGNOSIS — E785 Hyperlipidemia, unspecified: Secondary | ICD-10-CM

## 2013-03-14 DIAGNOSIS — M8580 Other specified disorders of bone density and structure, unspecified site: Secondary | ICD-10-CM

## 2013-03-14 DIAGNOSIS — F329 Major depressive disorder, single episode, unspecified: Secondary | ICD-10-CM

## 2013-03-14 DIAGNOSIS — M899 Disorder of bone, unspecified: Secondary | ICD-10-CM

## 2013-03-14 DIAGNOSIS — M1990 Unspecified osteoarthritis, unspecified site: Secondary | ICD-10-CM

## 2013-03-14 DIAGNOSIS — Z0000 Encounter for general adult medical examination without abnormal findings: Secondary | ICD-10-CM

## 2013-03-14 LAB — COMPREHENSIVE METABOLIC PANEL
ALT: 16 U/L (ref 0–35)
AST: 24 U/L (ref 0–37)
CO2: 30 mEq/L (ref 19–32)
Creatinine, Ser: 0.8 mg/dL (ref 0.4–1.2)
GFR: 79.06 mL/min (ref >60.00)
Total Bilirubin: 0.6 mg/dL (ref 0.3–1.2)

## 2013-03-14 LAB — LDL CHOLESTEROL, DIRECT: Direct LDL: 176.7 mg/dL

## 2013-03-14 LAB — CBC WITH DIFFERENTIAL/PLATELET
Basophils Absolute: 0 10*3/uL (ref 0.0–0.1)
Basophils Relative: 0.9 % (ref 0.0–3.0)
Eosinophils Absolute: 0.3 10*3/uL (ref 0.0–0.7)
Lymphocytes Relative: 24 % (ref 12.0–46.0)
MCHC: 34.5 g/dL (ref 30.0–36.0)
Neutrophils Relative %: 58.8 % (ref 43.0–77.0)
RBC: 4.1 Mil/uL (ref 3.87–5.11)

## 2013-03-14 LAB — LIPID PANEL
HDL: 71.5 mg/dL (ref >39.00)
VLDL: 19.6 mg/dL (ref 0.0–40.0)

## 2013-03-14 NOTE — Assessment & Plan Note (Addendum)
See previous entry, will check FLP, zetia? She is willing to try if necessary

## 2013-03-14 NOTE — Assessment & Plan Note (Addendum)
Td   2011 shingles shot 2009 pneumonia shot--2011 Had a flu shot  Colonoscopy: 05/15/2002, Next Due:  05/2012 (per chart review, no scope report found; patient told Cscope  was normal)----- refer to GI   Never had an abnormal PAP, married twice; PAP 11-09, 10-2009 neg  ; rec PAPs every 2 or 3 years; offered a referral to gyn, declined b/c is painful, will discuss again next year MMG 5-13 (-) , next 2015 Diet-exercise discussed

## 2013-03-14 NOTE — Patient Instructions (Signed)
Get your blood work before you leave  Next visit in 6 months  for a  follow up . No Fasting Please make an appointment    Fall Prevention and Home Safety Falls cause injuries and can affect all age groups. It is possible to use preventive measures to significantly decrease the likelihood of falls. There are many simple measures which can make your home safer and prevent falls. OUTDOORS  Repair cracks and edges of walkways and driveways.  Remove high doorway thresholds.  Trim shrubbery on the main path into your home.  Have good outside lighting.  Clear walkways of tools, rocks, debris, and clutter.  Check that handrails are not broken and are securely fastened. Both sides of steps should have handrails.  Have leaves, snow, and ice cleared regularly.  Use sand or salt on walkways during winter months.  In the garage, clean up grease or oil spills. BATHROOM  Install night lights.  Install grab bars by the toilet and in the tub and shower.  Use non-skid mats or decals in the tub or shower.  Place a plastic non-slip stool in the shower to sit on, if needed.  Keep floors dry and clean up all water on the floor immediately.  Remove soap buildup in the tub or shower on a regular basis.  Secure bath mats with non-slip, double-sided rug tape.  Remove throw rugs and tripping hazards from the floors. BEDROOMS  Install night lights.  Make sure a bedside light is easy to reach.  Do not use oversized bedding.  Keep a telephone by your bedside.  Have a firm chair with side arms to use for getting dressed.  Remove throw rugs and tripping hazards from the floor. KITCHEN  Keep handles on pots and pans turned toward the center of the stove. Use back burners when possible.  Clean up spills quickly and allow time for drying.  Avoid walking on wet floors.  Avoid hot utensils and knives.  Position shelves so they are not too high or low.  Place commonly used objects within  easy reach.  If necessary, use a sturdy step stool with a grab bar when reaching.  Keep electrical cables out of the way.  Do not use floor polish or wax that makes floors slippery. If you must use wax, use non-skid floor wax.  Remove throw rugs and tripping hazards from the floor. STAIRWAYS  Never leave objects on stairs.  Place handrails on both sides of stairways and use them. Fix any loose handrails. Make sure handrails on both sides of the stairways are as long as the stairs.  Check carpeting to make sure it is firmly attached along stairs. Make repairs to worn or loose carpet promptly.  Avoid placing throw rugs at the top or bottom of stairways, or properly secure the rug with carpet tape to prevent slippage. Get rid of throw rugs, if possible.  Have an electrician put in a light switch at the top and bottom of the stairs. OTHER FALL PREVENTION TIPS  Wear low-heel or rubber-soled shoes that are supportive and fit well. Wear closed toe shoes.  When using a stepladder, make sure it is fully opened and both spreaders are firmly locked. Do not climb a closed stepladder.  Add color or contrast paint or tape to grab bars and handrails in your home. Place contrasting color strips on first and last steps.  Learn and use mobility aids as needed. Install an electrical emergency response system.  Turn on lights   to avoid dark areas. Replace light bulbs that burn out immediately. Get light switches that glow.  Arrange furniture to create clear pathways. Keep furniture in the same place.  Firmly attach carpet with non-skid or double-sided tape.  Eliminate uneven floor surfaces.  Select a carpet pattern that does not visually hide the edge of steps.  Be aware of all pets. OTHER HOME SAFETY TIPS  Set the water temperature for 120 F (48.8 C).  Keep emergency numbers on or near the telephone.  Keep smoke detectors on every level of the home and near sleeping areas. Document  Released: 04/21/2002 Document Revised: 10/31/2011 Document Reviewed: 07/21/2011 Exit[employer] Patient Information 2014 [employer].  

## 2013-03-14 NOTE — Assessment & Plan Note (Signed)
Recommend calcium, vitamin D, a stay active. Will check a bone density test next year

## 2013-03-14 NOTE — Progress Notes (Signed)
Subjective:    Patient ID: Cynthia [name], female    DOB: Oct 03, 1945, 67 y.o.   MRN: [id number]  HPI  Here for Medicare AWV: 1.Risk factors based on Past M, S, F history: reviewed 2.Physical Activities: active at home, stationary bike  3.Depression/mood: some depression, states has a lot on her plate, no suicidal ideas, sees a counselor, doesn't like to change meds  4.Hearing: no problems noted or reported   5.ADL's: totally independent, drives  6.Fall Risk: no recent falls, prevention discussed  7.Home Safety:  Feels safe at home 8.Height, weight, &visual acuity: see vital signs, has pre- glaucoma, sees ophthalmology q 6 months   9. Counseling: see below 10. Labs ordered based on risk factors:  If needed   11. Referral Coordination --if needed 12. Care Plan-- discussed w/ patient plan ofcare for the next 12 months   13. Cognitive Assessment-- cognition and memory seem within normal  in addition to her physical we assessed the following problems Back pain, leg pain--Still hurts from time to time, uses Vicodin less than once a week. Glaucoma--her optometrist referred her to ophthalmology recently Rash--saw dermatology, still not improved, recommend to see the dermatologist one more time and call if not better, we can arrange a second opinion Allergies--well controlled with current medications.  Past Medical History  Diagnosis Date  . Depression   . Constipation, chronic   . Headache(784.0)   . Emphysema     chest xray showed, PFTs normal 2009  . Osteoporosis   . DEGENERATIVE JOINT DISEASE 06/02/2010  . PUD (peptic ulcer disease) 2012    Upper GI bleed d/t NSAIDs  . RLS (restless legs syndrome)     robaxin prn  . Glaucoma    Past Surgical History  Procedure Laterality Date  . Cesarean section    . Ankle fracture surgery      surgery 12-11 (LEFT)   History   Social History  . Marital Status: Married    Spouse Name: N/A    Number of Children: 3  . Years of Education:  N/A   Occupational History  . retired VP of [employer]    Social History Main Topics  . Smoking status: Never Smoker   . Smokeless tobacco: Never Used  . Alcohol Use: Yes     Comment: socially  . Drug Use: No  . Sexual Activity: Not on file   Other Topics Concern  . Not on file   Social History Narrative   2 step children, 1 child , 7 GK   Household- patient and her husband, he recently stop driving     Family History  Problem Relation Age of Onset  . Aneurysm Mother     brain  . Diabetes Other     grandfather  . Hypertension Neg Hx   . Cancer Neg Hx   . Coronary artery disease Neg Hx     Review of Systems No  CP, SOB, lower extremity edema Denies  nausea, vomiting diarrhea Denies  blood in the stools No dysuria, gross hematuria, difficulty urinating       Objective:   Physical Exam BP 113/78  Pulse 70  Temp(Src) 98.4 F (36.9 C)  Ht 5' 5.8" (1.671 m)  Wt 156 lb (70.761 kg)  BMI 25.34 kg/m2  SpO2 97% General -- alert, well-developed, NAD.  Neck --no thyromegaly , normal carotid pulse Lungs -- normal respiratory effort, no intercostal retractions, no accessory muscle use, and normal breath sounds.  Heart-- normal rate, regular  rhythm, no murmur.  Abdomen-- Not distended, good bowel sounds,soft, non-tender. No bruit. No mass,organomegaly. Extremities-- no pretibial edema bilaterally  Neurologic--  alert & oriented X3. Speech normal, gait normal, strength normal in all extremities.   Psych-- Cognition and judgment appear intact. Cooperative with normal attention span and concentration. No anxious appearing, no depressed appearing.      Assessment & Plan:

## 2013-03-14 NOTE — Assessment & Plan Note (Signed)
See history of present illness, will continue seeing a counselor, continue with same medications, if she desires to adjust or change medications she will let me know

## 2013-03-14 NOTE — Assessment & Plan Note (Signed)
On hydrocodone, taking  less than once a week

## 2013-03-17 ENCOUNTER — Telehealth: Payer: Self-pay | Admitting: *Deleted

## 2013-03-17 ENCOUNTER — Encounter: Payer: Self-pay | Admitting: *Deleted

## 2013-03-17 DIAGNOSIS — E785 Hyperlipidemia, unspecified: Secondary | ICD-10-CM

## 2013-03-17 NOTE — Telephone Encounter (Signed)
Pt would like to try samples first before an prescription for zetia is sent. Labs orders / pt scheduled Jun 17 2013. DJR

## 2013-03-17 NOTE — Telephone Encounter (Signed)
Message copied by Eustace Quail on Mon Mar 17, 2013 10:56 AM ------      Message from: Wanda Plump      Created: Sat Mar 15, 2013  3:42 PM       Advise patient,      Her cholesterol remains elevated, I recommend a trial with Zetia 10 mg one tablet daily, ok # 28 samples and call in prescription for #30 and 6 refills.      Other labs are normal.      Arrange FLP, AST, ALT in 3 months from today, dx hyperlipidemia (no needs for office visit, just labs) ------

## 2013-03-18 ENCOUNTER — Encounter: Payer: Self-pay | Admitting: Internal Medicine

## 2013-03-20 ENCOUNTER — Other Ambulatory Visit: Payer: Self-pay | Admitting: Internal Medicine

## 2013-03-20 NOTE — Telephone Encounter (Signed)
Citalopram refill sent to pharmacy.  

## 2013-04-04 ENCOUNTER — Ambulatory Visit (INDEPENDENT_AMBULATORY_CARE_PROVIDER_SITE_OTHER): Payer: Medicare FFS | Admitting: Physician Assistant

## 2013-04-04 ENCOUNTER — Encounter: Payer: Self-pay | Admitting: Physician Assistant

## 2013-04-04 ENCOUNTER — Ambulatory Visit (HOSPITAL_BASED_OUTPATIENT_CLINIC_OR_DEPARTMENT_OTHER)
Admission: RE | Admit: 2013-04-04 | Discharge: 2013-04-04 | Disposition: A | Discharge status: Home / Self Care | Payer: Medicare PPO | Source: Ambulatory Visit | Attending: Physician Assistant | Admitting: Physician Assistant

## 2013-04-04 ENCOUNTER — Telehealth: Payer: Self-pay

## 2013-04-04 VITALS — BP 106/82 | HR 70 | Temp 98.2°F | Resp 14 | Ht 65.5 in | Wt 163.0 lb

## 2013-04-04 DIAGNOSIS — S8992XA Unspecified injury of left lower leg, initial encounter: Secondary | ICD-10-CM

## 2013-04-04 DIAGNOSIS — W19XXXA Unspecified fall, initial encounter: Secondary | ICD-10-CM | POA: Insufficient documentation

## 2013-04-04 DIAGNOSIS — M25569 Pain in unspecified knee: Secondary | ICD-10-CM | POA: Insufficient documentation

## 2013-04-04 DIAGNOSIS — S8990XA Unspecified injury of unspecified lower leg, initial encounter: Secondary | ICD-10-CM

## 2013-04-04 NOTE — Progress Notes (Signed)
Pre visit review using our clinic review tool, if applicable. No additional management support is needed unless otherwise documented below in the visit note/SLS  

## 2013-04-04 NOTE — Telephone Encounter (Signed)
MSG from patient left on the VM on 04/03/13 at 3 pm and she stated she fell and hit her knee and had some swelling and looks like fluid on it. [facility] at 3:45.   KP

## 2013-04-04 NOTE — Patient Instructions (Signed)
Tylenol.  Rest.  Elevate affected leg at home.  Ice for 15 minutes every 4-6 hours.  I will call you with the results of your imaging.

## 2013-04-06 DIAGNOSIS — S8990XA Unspecified injury of unspecified lower leg, initial encounter: Secondary | ICD-10-CM | POA: Insufficient documentation

## 2013-04-06 NOTE — Assessment & Plan Note (Signed)
Injury sustained 3 weeks ago.  Will begin with X-ray of left knee.  Encourage ice, rest, elevation and NSAIDs. Will proceed with further imaging or evaluation pending normal Xray.

## 2013-04-06 NOTE — Progress Notes (Signed)
Patient ID: Cynthia [name], female   DOB: [birth year], 67 y.o.   MRN: [id number]  Patient presents to clinic today c/o swelling and discomfort in left knee after falling onto kneecap 3 weeks ago.  Patient states she was walking down the steps at her house, when she fell down onto her knee 1 step below.  Patient endorses pain after the incident but was able to ambulate.  States she began to develop swelling and bruising of her knee and LLE.  Denies pain or swelling of ankle or foot.  Denies trauma to other extremities.  Patient states that the pain has mostly dissipated but she notices pain and pulling when going up stairs.  Endorses continued swelling but much improved.  Bruising has resolved.  Patient denies numbness or tingling in LLE.  Patient denies twisting injury at time of fall.   Past Medical History  Diagnosis Date  . Depression   . Constipation, chronic   . Headache(784.0)   . Emphysema     chest xray showed, PFTs normal 2009  . Osteoporosis   . DEGENERATIVE JOINT DISEASE 06/02/2010  . PUD (peptic ulcer disease) 2012    Upper GI bleed d/t NSAIDs  . RLS (restless legs syndrome)     robaxin prn  . Glaucoma     Current Outpatient Prescriptions on File Prior to Visit  Medication Sig Dispense Refill  . azelastine (ASTELIN) 137 MCG/SPRAY nasal spray instill 2 sprays into each nostril at bedtime  30 mL  6  . buPROPion (WELLBUTRIN XL) 300 MG 24 hr tablet Take 1 tablet (300 mg total) by mouth daily.  90 tablet  3  . calcium gluconate 500 MG tablet Take 1,000 mg by mouth daily.        . cetirizine (ZYRTEC) 10 MG tablet Take 10 mg by mouth daily.        . Cholecalciferol (VITAMIN D-400 PO) Take by mouth.        . citalopram (CELEXA) 20 MG tablet take 1 tablet by mouth once daily  90 tablet  0  . geriatric multivitamins-minerals (ELDERTONIC/GEVRABON) ELIX Take 15 mLs by mouth daily.        Marland Kitchen HYDROcodone-acetaminophen (NORCO/VICODIN) 5-325 MG per tablet 1 tablet twice a day, 2 tablets at  bedtime as needed  60 tablet  0  . methocarbamol (ROBAXIN) 500 MG tablet Take 1 tablet (500 mg total) by mouth 3 (three) times daily as needed. For RLS  90 tablet  1  . Omega-3 Fatty Acids (FISH OIL) 1000 MG CPDR Take by mouth.         No current facility-administered medications on file prior to visit.    Allergies  Allergen Reactions  . Motrin [Ibuprofen]     GI bleed  . Sulfonamide Derivatives   . Ciprofloxacin Swelling and Rash    Family History  Problem Relation Age of Onset  . Aneurysm Mother     brain  . Diabetes Other     grandfather  . Hypertension Neg Hx   . Cancer Neg Hx   . Coronary artery disease Neg Hx     History   Social History  . Marital Status: Married    Spouse Name: N/A    Number of Children: 3  . Years of Education: N/A   Occupational History  . retired VP of [employer]    Social History Main Topics  . Smoking status: Never Smoker   . Smokeless tobacco: Never Used  . Alcohol Use: Yes  Comment: socially  . Drug Use: No  . Sexual Activity: None   Other Topics Concern  . None   Social History Narrative   2 step children, 1 child , 7 GK   Household- patient and her husband, he recently stop driving      Review of Systems - See HPI.  All other ROS are negative.  Filed Vitals:   04/04/13 1613  BP: 106/82  Pulse: 70  Temp: 98.2 F (36.8 C)  Resp: 14   Physical Exam  Vitals reviewed. Constitutional: She is oriented to person, place, and time and well-developed, well-nourished, and in no distress.  HENT:  Head: Normocephalic and atraumatic.  Eyes: Conjunctivae are normal.  Neck: Neck supple.  Cardiovascular: Normal rate, regular rhythm, normal heart sounds and intact distal pulses.   Pulmonary/Chest: Effort normal and breath sounds normal.  Musculoskeletal: Normal range of motion.       Right knee: Normal.       Left knee: She exhibits swelling, effusion and bony tenderness. She exhibits normal range of motion, no  ecchymosis, no deformity, no laceration, no erythema and normal alignment. Tenderness found. Patellar tendon tenderness noted.       Right ankle: Normal.       Left ankle: Normal.       Right upper leg: Normal.       Left upper leg: Normal.       Right lower leg: Normal.       Left lower leg: Normal.       Right foot: Normal.       Left foot: Normal.  Neurological: She is alert and oriented to person, place, and time.  Skin: Skin is warm and dry. No rash noted. No erythema.  Psychiatric: Affect normal.     Recent Results (from the past 2160 hour(s))  COMPREHENSIVE METABOLIC PANEL     Status: None   Collection Time    03/14/13 10:02 AM      Result Value Range   Sodium 137  135 - 145 mEq/L   Potassium 4.4  3.5 - 5.1 mEq/L   Chloride 99  96 - 112 mEq/L   CO2 30  19 - 32 mEq/L   Glucose, Bld 76  70 - 99 mg/dL   BUN 14  6 - 23 mg/dL   Creatinine, Ser 0.8  0.4 - 1.2 mg/dL   Total Bilirubin 0.6  0.3 - 1.2 mg/dL   Alkaline Phosphatase 76  39 - 117 U/L   AST 24  0 - 37 U/L   ALT 16  0 - 35 U/L   Total Protein 7.0  6.0 - 8.3 g/dL   Albumin 4.1  3.5 - 5.2 g/dL   Calcium 9.7  8.4 - 16.1 mg/dL   GFR 09.60  >45.40 mL/min  LIPID PANEL     Status: Abnormal   Collection Time    03/14/13 10:02 AM      Result Value Range   Cholesterol 266 (*) 0 - 200 mg/dL   Comment: ATP III Classification       Desirable:  < 200 mg/dL               Borderline High:  200 - 239 mg/dL          High:  > = 981 mg/dL   Triglycerides 19.1  0.0 - 149.0 mg/dL   Comment: Normal:  <478 mg/dLBorderline High:  150 - 199 mg/dL   HDL 29.56  >21.30 mg/dL  VLDL 19.6  0.0 - 40.0 mg/dL   Total CHOL/HDL Ratio 4     Comment:                Men          Women1/2 Average Risk     3.4          3.3Average Risk          5.0          4.42X Average Risk          9.6          7.13X Average Risk          15.0          11.0                      CBC WITH DIFFERENTIAL     Status: Abnormal   Collection Time    03/14/13 10:02 AM       Result Value Range   WBC 5.3  4.5 - 10.5 K/uL   RBC 4.10  3.87 - 5.11 Mil/uL   Hemoglobin 13.1  12.0 - 15.0 g/dL   HCT 96.2  95.2 - 84.1 %   MCV 92.6  78.0 - 100.0 fl   MCHC 34.5  30.0 - 36.0 g/dL   RDW 32.4  40.1 - 02.7 %   Platelets 383.0  150.0 - 400.0 K/uL   Neutrophils Relative % 58.8  43.0 - 77.0 %   Lymphocytes Relative 24.0  12.0 - 46.0 %   Monocytes Relative 10.7  3.0 - 12.0 %   Eosinophils Relative 5.6 (*) 0.0 - 5.0 %   Basophils Relative 0.9  0.0 - 3.0 %   Neutro Abs 3.1  1.4 - 7.7 K/uL   Lymphs Abs 1.3  0.7 - 4.0 K/uL   Monocytes Absolute 0.6  0.1 - 1.0 K/uL   Eosinophils Absolute 0.3  0.0 - 0.7 K/uL   Basophils Absolute 0.0  0.0 - 0.1 K/uL  LDL CHOLESTEROL, DIRECT     Status: None   Collection Time    03/14/13 10:02 AM      Result Value Range   Direct LDL 176.7     Comment: Optimal:  <100 mg/dLNear or Above Optimal:  100-129 mg/dLBorderline High:  130-159 mg/dLHigh:  160-189 mg/dLVery High:  >190 mg/dL    Assessment/Plan: Knee injury Injury sustained 3 weeks ago.  Will begin with X-ray of left knee.  Encourage ice, rest, elevation and NSAIDs. Will proceed with further imaging or evaluation pending normal Xray.

## 2013-04-18 ENCOUNTER — Other Ambulatory Visit: Payer: Self-pay | Admitting: Internal Medicine

## 2013-04-18 NOTE — Telephone Encounter (Signed)
Bupropion refilled per protocol 

## 2013-05-16 ENCOUNTER — Encounter: Payer: Self-pay | Admitting: Internal Medicine

## 2013-06-17 ENCOUNTER — Other Ambulatory Visit (INDEPENDENT_AMBULATORY_CARE_PROVIDER_SITE_OTHER): Payer: Medicare PPO

## 2013-06-17 DIAGNOSIS — E785 Hyperlipidemia, unspecified: Secondary | ICD-10-CM

## 2013-06-17 LAB — HEPATIC FUNCTION PANEL
ALBUMIN: 4.1 g/dL (ref 3.5–5.2)
ALT: 15 U/L (ref 0–35)
AST: 17 U/L (ref 0–37)
Alkaline Phosphatase: 72 U/L (ref 39–117)
Bilirubin, Direct: 0 mg/dL (ref 0.0–0.3)
Total Bilirubin: 0.6 mg/dL (ref 0.3–1.2)
Total Protein: 6.5 g/dL (ref 6.0–8.3)

## 2013-06-17 LAB — ALT: ALT: 15 U/L (ref 0–35)

## 2013-06-17 LAB — AST: AST: 17 U/L (ref 0–37)

## 2013-06-20 ENCOUNTER — Ambulatory Visit: Payer: Medicare PPO

## 2013-06-20 DIAGNOSIS — E785 Hyperlipidemia, unspecified: Secondary | ICD-10-CM

## 2013-06-23 ENCOUNTER — Other Ambulatory Visit: Payer: Self-pay | Admitting: Internal Medicine

## 2013-06-23 LAB — LIPID PANEL
Cholesterol: 276 mg/dL — ABNORMAL HIGH (ref 0–200)
HDL: 78.7 mg/dL (ref >39.00)
Total CHOL/HDL Ratio: 4
Triglycerides: 59 mg/dL (ref 0.0–149.0)
VLDL: 11.8 mg/dL (ref 0.0–40.0)

## 2013-06-23 LAB — LDL CHOLESTEROL, DIRECT: LDL DIRECT: 187.8 mg/dL

## 2013-07-12 ENCOUNTER — Ambulatory Visit (INDEPENDENT_AMBULATORY_CARE_PROVIDER_SITE_OTHER): Payer: Medicare PPO | Admitting: Internal Medicine

## 2013-07-12 ENCOUNTER — Encounter: Payer: Self-pay | Admitting: Internal Medicine

## 2013-07-12 VITALS — BP 112/70 | HR 83 | Temp 98.6°F | Ht 66.0 in | Wt 160.2 lb

## 2013-07-12 DIAGNOSIS — R110 Nausea: Secondary | ICD-10-CM

## 2013-07-12 DIAGNOSIS — R5383 Other fatigue: Secondary | ICD-10-CM

## 2013-07-12 DIAGNOSIS — R5381 Other malaise: Secondary | ICD-10-CM

## 2013-07-12 DIAGNOSIS — R52 Pain, unspecified: Secondary | ICD-10-CM

## 2013-07-12 MED ORDER — ONDANSETRON HCL 4 MG PO TABS: 4.0000 mg | ORAL_TABLET | Freq: Three times a day (TID) | ORAL | Status: DC | PRN

## 2013-07-12 NOTE — Progress Notes (Signed)
Pre visit review using our clinic review tool, if applicable. No additional management support is needed unless otherwise documented below in the visit note. 

## 2013-07-12 NOTE — Progress Notes (Signed)
Subjective:    Patient ID: Cynthia [name], female    DOB: 1946/01/23, 68 y.o.   MRN: [id number]  HPI  Pt presents to the clinic today with c/o nausea, fatigue and body aches. This started yesterday morning. She denies diarrhea, vomiting. She has not taken anything OTC. She has not had sick contacts that she is aware  Review of Systems      Past Medical History  Diagnosis Date  . Depression   . Constipation, chronic   . Headache(784.0)   . Emphysema     chest xray showed, PFTs normal 2009  . Osteoporosis   . DEGENERATIVE JOINT DISEASE 06/02/2010  . PUD (peptic ulcer disease) 2012    Upper GI bleed d/t NSAIDs  . RLS (restless legs syndrome)     robaxin prn  . Glaucoma     Current Outpatient Prescriptions  Medication Sig Dispense Refill  . azelastine (ASTELIN) 137 MCG/SPRAY nasal spray instill 2 sprays into each nostril at bedtime  30 mL  6  . buPROPion (WELLBUTRIN XL) 300 MG 24 hr tablet take 1 tablet by mouth once daily  90 tablet  3  . calcium gluconate 500 MG tablet Take 1,000 mg by mouth daily.        . cetirizine (ZYRTEC) 10 MG tablet Take 10 mg by mouth daily.        . Cholecalciferol (VITAMIN D-400 PO) Take by mouth.        . citalopram (CELEXA) 20 MG tablet take 1 tablet by mouth once daily  90 tablet  0  . ezetimibe (ZETIA) 10 MG tablet Take 10 mg by mouth daily.      Marland Kitchen geriatric multivitamins-minerals (ELDERTONIC/GEVRABON) ELIX Take 15 mLs by mouth daily.        Marland Kitchen HYDROcodone-acetaminophen (NORCO/VICODIN) 5-325 MG per tablet 1 tablet twice a day, 2 tablets at bedtime as needed  60 tablet  0  . methocarbamol (ROBAXIN) 500 MG tablet Take 1 tablet (500 mg total) by mouth 3 (three) times daily as needed. For RLS  90 tablet  1  . naftifine (NAFTIN) 1 % cream Apply 1 application topically daily.      . Omega-3 Fatty Acids (FISH OIL) 1000 MG CPDR Take by mouth.         No current facility-administered medications for this visit.    Allergies  Allergen Reactions  .  Motrin [Ibuprofen]     GI bleed  . Sulfonamide Derivatives   . Ciprofloxacin Swelling and Rash    Family History  Problem Relation Age of Onset  . Aneurysm Mother     brain  . Diabetes Other     grandfather  . Hypertension Neg Hx   . Cancer Neg Hx   . Coronary artery disease Neg Hx     History   Social History  . Marital Status: Married    Spouse Name: N/A    Number of Children: 3  . Years of Education: N/A   Occupational History  . 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 Smoking status: Never Smoker   . Smokeless tobacco: Never Used  . Alcohol Use: Yes     Comment: socially  . Drug Use: No  . Sexual Activity: Not on file   Other Topics Concern  . Not on file   Social History Narrative   2 step children, 1 child , [place]- patient and her husband, he recently stop  driving       Constitutional: Pt reports fatigue and malaise. Denies fever, headache or abrupt weight changes.  Gastrointestinal: Pt reports nausea. Denies abdominal pain, bloating, constipation, diarrhea or blood in the stool.    No other specific complaints in a complete review of systems (except as listed in HPI above).  Objective:   Physical Exam   BP 112/70  Pulse 83  Temp(Src) 98.6 F (37 C) (Oral)  Ht 5\' 6"  (1.676 m)  Wt 160 lb 4 oz (72.689 kg)  BMI 25.88 kg/m2  SpO2 97% Wt Readings from Last 3 Encounters:  07/12/13 160 lb 4 oz (72.689 kg)  04/04/13 163 lb (73.936 kg)  03/14/13 156 lb (70.761 kg)    General: Appears her stated age, well developed, well nourished in NAD. Cardiovascular: Normal rate and rhythm. S1,S2 noted.  No murmur, rubs or gallops noted. No JVD or BLE edema. No carotid bruits noted. Pulmonary/Chest: Normal effort and positive vesicular breath sounds. No respiratory distress. No wheezes, rales or ronchi noted.  Abdomen: Soft and nontender. Normal bowel sounds, no bruits noted. No distention or masses noted. Liver, spleen and  kidneys non palpable.   BMET    Component Value Date/Time   NA 137 03/14/2013 1002   K 4.4 03/14/2013 1002   CL 99 03/14/2013 1002   CO2 30 03/14/2013 1002   GLUCOSE 76 03/14/2013 1002   GLUCOSE 80 04/20/2006 0904   BUN 14 03/14/2013 1002   CREATININE 0.8 03/14/2013 1002   CALCIUM 9.7 03/14/2013 1002   GFRNONAA >60 10/26/2010 0811   GFRAA >60 10/26/2010 0811    Lipid Panel     Component Value Date/Time   CHOL 276* 06/20/2013 1414   TRIG 59.0 06/20/2013 1414   HDL 78.70 06/20/2013 1414   CHOLHDL 4 06/20/2013 1414   VLDL 11.8 06/20/2013 1414    CBC    Component Value Date/Time   WBC 5.3 03/14/2013 1002   RBC 4.10 03/14/2013 1002   HGB 13.1 03/14/2013 1002   HCT 37.9 03/14/2013 1002   PLT 383.0 03/14/2013 1002   MCV 92.6 03/14/2013 1002   MCH 31.1 10/26/2010 1426   MCHC 34.5 03/14/2013 1002   RDW 13.4 03/14/2013 1002   LYMPHSABS 1.3 03/14/2013 1002   MONOABS 0.6 03/14/2013 1002   EOSABS 0.3 03/14/2013 1002   BASOSABS 0.0 03/14/2013 1002    Hgb A1C No results found for this basename: HGBA1C        Assessment & Plan:   Nausea, body aches, fatigue, likely viral at this point:  Supportive care at this time- fluids, rest, tylenol eRx for zofran for nausea Wash hand thoroughly and spray doorknobs, toilet handles with lysol  RTC as needed or if symptoms persist or worsen

## 2013-07-12 NOTE — Patient Instructions (Addendum)
Nausea, Adult Nausea is the feeling that you have an upset stomach or have to vomit. Nausea by itself is not likely a serious concern, but it may be an early sign of more serious medical problems. As nausea gets worse, it can lead to vomiting. If vomiting develops, there is the risk of dehydration.  CAUSES   Viral infections.  Food poisoning.  Medicines.  Pregnancy.  Motion sickness.  Migraine headaches.  Emotional distress.  Severe pain from any source.  Alcohol intoxication. HOME CARE INSTRUCTIONS  Get plenty of rest.  Ask your caregiver about specific rehydration instructions.  Eat small amounts of food and sip liquids more often.  Take all medicines as told by your caregiver. SEEK MEDICAL CARE IF:  You have not improved after 2 days, or you get worse.  You have a headache. SEEK IMMEDIATE MEDICAL CARE IF:   You have a fever.  You faint.  You keep vomiting or have blood in your vomit.  You are extremely weak or dehydrated.  You have dark or bloody stools.  You have severe chest or abdominal pain. MAKE SURE YOU:  Understand these instructions.  Will watch your condition.  Will get help right away if you are not doing well or get worse. Document Released: 06/08/2004 Document Revised: 01/24/2012 Document Reviewed: 01/11/2011 Exit[employer] Patient Information 2014 [employer].  

## 2013-07-29 ENCOUNTER — Encounter: Payer: Self-pay | Admitting: Family Medicine

## 2013-07-29 ENCOUNTER — Telehealth: Payer: Self-pay

## 2013-07-29 ENCOUNTER — Ambulatory Visit (INDEPENDENT_AMBULATORY_CARE_PROVIDER_SITE_OTHER): Payer: Medicare PPO | Admitting: Family Medicine

## 2013-07-29 VITALS — BP 114/72 | HR 100 | Temp 100.5°F | Wt 156.0 lb

## 2013-07-29 DIAGNOSIS — R509 Fever, unspecified: Secondary | ICD-10-CM

## 2013-07-29 DIAGNOSIS — R059 Cough, unspecified: Secondary | ICD-10-CM

## 2013-07-29 DIAGNOSIS — R05 Cough: Secondary | ICD-10-CM

## 2013-07-29 DIAGNOSIS — J111 Influenza due to unidentified influenza virus with other respiratory manifestations: Secondary | ICD-10-CM

## 2013-07-29 MED ORDER — OSELTAMIVIR PHOSPHATE 75 MG PO CAPS: 75.0000 mg | ORAL_CAPSULE | Freq: Two times a day (BID) | ORAL | Status: DC

## 2013-07-29 MED ORDER — PSEUDOEPHEDRINE-CODEINE-GG 30-10-100 MG/5ML PO SOLN: ORAL | Status: DC

## 2013-07-29 NOTE — Progress Notes (Signed)
Pre visit review using our clinic review tool, if applicable. No additional management support is needed unless otherwise documented below in the visit note. 

## 2013-07-29 NOTE — Telephone Encounter (Signed)
Patient called stating that she has a deep cough, sore throat and a fever. Would like something sent in.  Advised that she would need to be seen in order to receive any Rx meds.  Scheduled to be seen by Dr Etter Sjogren today.

## 2013-07-29 NOTE — Progress Notes (Signed)
Subjective:     Cynthia [name] is a 68 y.o. female who presents for evaluation of symptoms of a URI. Symptoms include congestion, facial pain, fever 100.4 and non productive cough. Onset of symptoms was 1 day ago, and has been gradually worsening since that time. Treatment to date: none.  The following portions of the patient's history were reviewed and updated as appropriate:  She  has a past medical history of Depression; Constipation, chronic; Headache(784.0); Emphysema; Osteoporosis; DEGENERATIVE JOINT DISEASE (06/02/2010); PUD (peptic ulcer disease) (2012); RLS (restless legs syndrome); and Glaucoma. She  does not have any pertinent problems on file. She  has past surgical history that includes Cesarean section and Ankle fracture surgery. Her family history includes Aneurysm in her mother; Diabetes in her other. There is no history of Hypertension, Cancer, or Coronary artery disease. She  reports that she has never smoked. She has never used smokeless tobacco. She reports that she drinks alcohol. She reports that she does not use illicit drugs. She has a current medication list which includes the following prescription(s): azelastine, bupropion, calcipotriene, calcium gluconate, cetirizine, cholecalciferol, citalopram, ezetimibe, fluocinonide, geriatric multivitamins-minerals, hydrocodone-acetaminophen, methocarbamol, naftifine, fish oil, ondansetron, triamcinolone cream, oseltamivir, and pseudoephedrine-codeine-guaifenesin. Current Outpatient Prescriptions on File Prior to Visit  Medication Sig Dispense Refill  . azelastine (ASTELIN) 137 MCG/SPRAY nasal spray instill 2 sprays into each nostril at bedtime  30 mL  6  . buPROPion (WELLBUTRIN XL) 300 MG 24 hr tablet take 1 tablet by mouth once daily  90 tablet  3  . calcium gluconate 500 MG tablet Take 1,000 mg by mouth daily.        . cetirizine (ZYRTEC) 10 MG tablet Take 10 mg by mouth daily.        . Cholecalciferol (VITAMIN D-400 PO) Take by  mouth.        . citalopram (CELEXA) 20 MG tablet take 1 tablet by mouth once daily  90 tablet  0  . ezetimibe (ZETIA) 10 MG tablet Take 10 mg by mouth daily.      Marland Kitchen geriatric multivitamins-minerals (ELDERTONIC/GEVRABON) ELIX Take 15 mLs by mouth daily.        Marland Kitchen HYDROcodone-acetaminophen (NORCO/VICODIN) 5-325 MG per tablet 1 tablet twice a day, 2 tablets at bedtime as needed  60 tablet  0  . methocarbamol (ROBAXIN) 500 MG tablet Take 1 tablet (500 mg total) by mouth 3 (three) times daily as needed. For RLS  90 tablet  1  . naftifine (NAFTIN) 1 % cream Apply 1 application topically daily.      . Omega-3 Fatty Acids (FISH OIL) 1000 MG CPDR Take by mouth.        . ondansetron (ZOFRAN) 4 MG tablet Take 1 tablet (4 mg total) by mouth every 8 (eight) hours as needed.  20 tablet  0   No current facility-administered medications on file prior to visit.   She is allergic to motrin; sulfonamide derivatives; and ciprofloxacin..  Review of Systems Pertinent items are noted in HPI.   Objective:    BP 114/72  Pulse 100  Temp(Src) 100.5 F (38.1 C) (Oral)  Wt 156 lb (70.761 kg)  SpO2 96% General appearance: alert, cooperative, appears stated age and no distress Ears: normal TM's and external ear canals both ears Nose: clear discharge, mild congestion, sinus tenderness right Throat: lips, mucosa, and tongue normal; teeth and gums normal Neck: no adenopathy, supple, symmetrical, trachea midline and thyroid not enlarged, symmetric, no tenderness/mass/nodules Lungs: clear to auscultation bilaterally Heart: S1,  S2 normal Extremities: extremities normal, atraumatic, no cyanosis or edema   Assessment:    influenza   Plan:    Discussed the importance of avoiding unnecessary antibiotic therapy. Suggested symptomatic OTC remedies. Nasal saline spray for congestion. Follow up as needed. tamiflu  Cough med per orders

## 2013-07-29 NOTE — Patient Instructions (Signed)

## 2013-09-19 ENCOUNTER — Telehealth: Payer: Self-pay

## 2013-09-19 DIAGNOSIS — Z1211 Encounter for screening for malignant neoplasm of colon: Secondary | ICD-10-CM

## 2013-09-19 NOTE — Telephone Encounter (Signed)
Patient in office request for [facility] referral.  Ok per Dr Larose Kells Order entered

## 2013-09-22 ENCOUNTER — Other Ambulatory Visit: Payer: Self-pay | Admitting: Internal Medicine

## 2013-09-23 ENCOUNTER — Other Ambulatory Visit: Payer: Self-pay | Admitting: Internal Medicine

## 2013-10-27 ENCOUNTER — Telehealth: Payer: Self-pay | Admitting: Internal Medicine

## 2013-10-27 ENCOUNTER — Telehealth: Payer: Self-pay | Admitting: *Deleted

## 2013-10-27 DIAGNOSIS — Z1211 Encounter for screening for malignant neoplasm of colon: Secondary | ICD-10-CM

## 2013-10-27 MED ORDER — METHOCARBAMOL 500 MG PO TABS: ORAL_TABLET | ORAL | Status: DC

## 2013-10-27 NOTE — Telephone Encounter (Signed)
Arrange a GI referral, needs a colonoscopy (dx V70) Okay to refill methocarbamol #90 and 3 refills

## 2013-10-27 NOTE — Telephone Encounter (Signed)
Referral sent. Done

## 2013-10-27 NOTE — Telephone Encounter (Signed)
Prior authorization approved for methocarbamol (ROBAXIN) 500 MG tablet. JG//[facility]

## 2013-10-27 NOTE — Telephone Encounter (Signed)
Caller name: Kadi Relation to pt: patient Call back number: [phone] Pharmacy: [facility], [place]    Reason for call: patient called and stated that she was suppose to get a referral to have a colonoscopy done. There are no referral orders in epic for her. Also she is requesting a refill for Methocarbamol Please advise.

## 2013-10-29 ENCOUNTER — Encounter: Payer: Self-pay | Admitting: Gastroenterology

## 2013-11-10 ENCOUNTER — Other Ambulatory Visit: Payer: Self-pay

## 2013-11-10 DIAGNOSIS — Z1231 Encounter for screening mammogram for malignant neoplasm of breast: Secondary | ICD-10-CM

## 2013-11-20 ENCOUNTER — Ambulatory Visit
Admission: RE | Admit: 2013-11-20 | Discharge: 2013-11-20 | Disposition: A | Discharge status: Home / Self Care | Payer: Medicare PPO | Source: Ambulatory Visit

## 2013-11-20 DIAGNOSIS — Z1231 Encounter for screening mammogram for malignant neoplasm of breast: Secondary | ICD-10-CM

## 2013-12-04 ENCOUNTER — Telehealth: Payer: Self-pay | Admitting: *Deleted

## 2013-12-04 ENCOUNTER — Ambulatory Visit (AMBULATORY_SURGERY_CENTER): Payer: Medicare PPO | Admitting: *Deleted

## 2013-12-04 VITALS — Ht 65.5 in | Wt 158.2 lb

## 2013-12-04 DIAGNOSIS — Z1211 Encounter for screening for malignant neoplasm of colon: Secondary | ICD-10-CM

## 2013-12-04 NOTE — Telephone Encounter (Signed)
Dr. Deatra Ina, This pt is scheduled to see Dr. Ardis Hughs on 12-17-13 for a screening colonoscopy.  She saw you in 2012 at [facility] for an EGD but wasn't scheduled with you for her colonoscopy.  Her husband doesn't drive and she has made arrangements with a friend to bring her for her procedure.  It is very difficult for her to get a driver You do not have any openings until September and she does not wish to wait until then because she states that she has been trying to get this scheduled for over a year.  You have an opening on 12-08-13 but she cannot do that date.  Is it ok to change her to Dr. Ardis Hughs, if ok with him?  Thanks, [employer]

## 2013-12-04 NOTE — Progress Notes (Signed)
This note was sent to Dr. Deatra Ina:   Dr. Deatra Ina,  This pt is scheduled to see Dr. Ardis Hughs on 12-17-13 for a screening colonoscopy. She saw you in 2012 at [facility] for an EGD but wasn't scheduled with you for her colonoscopy. Her husband doesn't drive and she has made arrangements with a friend to bring her for her procedure. It is very difficult for her to get a driver You do not have any openings until September and she does not wish to wait until then because she states that she has been trying to get this scheduled for over a year. You have an opening on 12-08-13 but she cannot do that date. Is it ok to change her to Dr. Ardis Hughs, if ok with him?  Thanks,  Kristen      No egg or soy allergy  No home oxygen use or problems with anesthesia  No medications for weight loss taken  emmi information given

## 2013-12-08 NOTE — Telephone Encounter (Signed)
Yes, thank you.

## 2013-12-08 NOTE — Telephone Encounter (Signed)
Dr. Ardis Hughs,  Do you accept care for this pt?  Cynthia [name]

## 2013-12-08 NOTE — Telephone Encounter (Signed)
Spoke with pt and informed her of MD's decision and she is to keep her appt as scheduled for 12-17-13

## 2013-12-08 NOTE — Telephone Encounter (Signed)
yes

## 2013-12-15 ENCOUNTER — Telehealth: Payer: Self-pay | Admitting: Gastroenterology

## 2013-12-15 DIAGNOSIS — Z1211 Encounter for screening for malignant neoplasm of colon: Secondary | ICD-10-CM

## 2013-12-15 MED ORDER — MOVIPREP 100 G PO SOLR: 1.0000 | Freq: Once | ORAL | Status: DC

## 2013-12-15 NOTE — Telephone Encounter (Signed)
Spoke with patients husband..he states that the patient has picked up her prescription from the pharmacy.

## 2013-12-15 NOTE — Telephone Encounter (Signed)
Attempted to call patient to notify her prescription for moviprep was sent to pharmacy on [address]. No answer. Message left to call [phone].

## 2013-12-17 ENCOUNTER — Ambulatory Visit (AMBULATORY_SURGERY_CENTER): Payer: Medicare PPO | Admitting: Gastroenterology

## 2013-12-17 ENCOUNTER — Encounter: Payer: Self-pay | Admitting: Gastroenterology

## 2013-12-17 VITALS — BP 105/62 | HR 65 | Temp 98.0°F | Resp 35 | Ht 65.5 in | Wt 158.0 lb

## 2013-12-17 DIAGNOSIS — Z1211 Encounter for screening for malignant neoplasm of colon: Secondary | ICD-10-CM

## 2013-12-17 DIAGNOSIS — D126 Benign neoplasm of colon, unspecified: Secondary | ICD-10-CM

## 2013-12-17 HISTORY — PX: COLONOSCOPY: SHX174

## 2013-12-17 MED ORDER — SODIUM CHLORIDE 0.9 % IV SOLN: 500.0000 mL | INTRAVENOUS | Status: DC

## 2013-12-17 NOTE — Progress Notes (Signed)
A/ox3, pleased with MAC, report to RN 

## 2013-12-17 NOTE — Progress Notes (Signed)
Called to room to assist during endoscopic procedure.  Patient ID and intended procedure confirmed with present staff. Received instructions for my participation in the procedure from the performing physician.  

## 2013-12-17 NOTE — Progress Notes (Signed)
Attempted #22 in right hand and wrist without success.  Brisk blood return both times but swelling right above insertion site when starting IV fluids.  No complaints of pain or discomfort.  Pressure dressing applied to both sites. Pt states veins are very small and they have to use a butterfly needle. Anesthesia in to start IV.

## 2013-12-17 NOTE — Op Note (Signed)
[facility]  [address]. [place], [address]   COLONOSCOPY PROCEDURE REPORT  PATIENT: Cynthia [name], Cynthia [name]  MR#: [id number] BIRTHDATE: 1946-01-09 , 26  yrs. old GENDER: Female ENDOSCOPIST: Milus Banister, MD REFERRED EE:FEOF Larose Kells, M.D. PROCEDURE DATE:  12/17/2013 PROCEDURE:   Colonoscopy with snare polypectomy First Screening Colonoscopy - Avg.  risk and is 50 yrs.  old or older - No.  Prior Negative Screening - Now for repeat screening. 10 or more years since last screening  History of Adenoma - Now for follow-up colonoscopy & has been > or = to 3 yrs.  N/A  Polyps Removed Today? Yes. ASA CLASS:   Class II INDICATIONS:average risk screening, normal colonoscopy Dr. Wynetta Emery 2004 MEDICATIONS: MAC sedation, administered by CRNA and propofol (Diprivan) 200mg  IV  DESCRIPTION OF PROCEDURE:   After the risks benefits and alternatives of the procedure were thoroughly explained, informed consent was obtained.  A digital rectal exam revealed no abnormalities of the rectum.   The [id number]  endoscope was introduced through the anus and advanced to the cecum, which was identified by both the appendix and ileocecal valve. No adverse events experienced.   The quality of the prep was good.  The instrument was then slowly withdrawn as the colon was fully examined.   COLON FINDINGS: One polyp was found, removed and sent to pathology. This was sessile, 3-41mm across, located in ascending segment, removed with cold snare.  The examination was otherwise normal. Retroflexed views revealed no abnormalities. The time to cecum=4 minutes 15 seconds.  Withdrawal time=6 minutes 42 seconds.  The scope was withdrawn and the procedure completed. COMPLICATIONS: There were no complications.  ENDOSCOPIC IMPRESSION: One polyp was found, removed and sent to pathology. The examination was otherwise normal.  RECOMMENDATIONS: If the polyp(s) removed today are proven to be  adenomatous (pre-cancerous) polyps, you will need a repeat colonoscopy in 5 years.  You will receive a letter within 1-2 weeks with the results of your biopsy as well as final recommendations.  Please call my office if you have not received a letter after 3 weeks.   eSigned:  Milus Banister, MD 12/17/2013 9:50 AM

## 2013-12-17 NOTE — Patient Instructions (Signed)
Discharge instructions given with verbal understanding. Handout on polyps. Resume previous medications. YOU HAD AN ENDOSCOPIC PROCEDURE TODAY AT THE [facility]: Refer to the procedure report that was given to you for any specific questions about what was found during the examination.  If the procedure report does not answer your questions, please call your gastroenterologist to clarify.  If you requested that your care partner not be given the details of your procedure findings, then the procedure report has been included in a sealed envelope for you to review at your convenience later.  YOU SHOULD EXPECT: Some feelings of bloating in the abdomen. Passage of more gas than usual.  Walking can help get rid of the air that was put into your GI tract during the procedure and reduce the bloating. If you had a lower endoscopy (such as a colonoscopy or flexible sigmoidoscopy) you may notice spotting of blood in your stool or on the toilet paper. If you underwent a bowel prep for your procedure, then you may not have a normal bowel movement for a few days.  DIET: Your first meal following the procedure should be a light meal and then it is ok to progress to your normal diet.  A half-sandwich or bowl of soup is an example of a good first meal.  Heavy or fried foods are harder to digest and may make you feel nauseous or bloated.  Likewise meals heavy in dairy and vegetables can cause extra gas to form and this can also increase the bloating.  Drink plenty of fluids but you should avoid alcoholic beverages for 24 hours.  ACTIVITY: Your care partner should take you home directly after the procedure.  You should plan to take it easy, moving slowly for the rest of the day.  You can resume normal activity the day after the procedure however you should NOT DRIVE or use heavy machinery for 24 hours (because of the sedation medicines used during the test).    SYMPTOMS TO REPORT IMMEDIATELY: A  gastroenterologist can be reached at any hour.  During normal business hours, 8:30 AM to 5:00 PM Monday through Friday, call [phone].  After hours and on weekends, please call the GI answering service at [phone] who will take a message and have the physician on call contact you.   Following lower endoscopy (colonoscopy or flexible sigmoidoscopy):  Excessive amounts of blood in the stool  Significant tenderness or worsening of abdominal pains  Swelling of the abdomen that is new, acute  Fever of 100F or higher  FOLLOW UP: If any biopsies were taken you will be contacted by phone or by letter within the next 1-3 weeks.  Call your gastroenterologist if you have not heard about the biopsies in 3 weeks.  Our staff will call the home number listed on your records the next business day following your procedure to check on you and address any questions or concerns that you may have at that time regarding the information given to you following your procedure. This is a courtesy call and so if there is no answer at the home number and we have not heard from you through the emergency physician on call, we will assume that you have returned to your regular daily activities without incident.  SIGNATURES/CONFIDENTIALITY: You and/or your care partner have signed paperwork which will be entered into your electronic medical record.  These signatures attest to the fact that that the information above on your After Visit Summary has been   reviewed and is understood.  Full responsibility of the confidentiality of this discharge information lies with you and/or your care-partner. 

## 2013-12-18 ENCOUNTER — Telehealth: Payer: Self-pay | Admitting: *Deleted

## 2013-12-18 NOTE — Telephone Encounter (Signed)
  Follow up Call-  Call back number 12/17/2013  Post procedure Call Back phone  # [phone]  Permission to leave phone message Yes     Patient questions:  Do you have a fever, pain , or abdominal swelling? No. Pain Score  0 *  Have you tolerated food without any problems? Yes.    Have you been able to return to your normal activities? Yes.    Do you have any questions about your discharge instructions: Diet   No. Medications  No. Follow up visit  No.  Do you have questions or concerns about your Care? No.  Actions: * If pain score is 4 or above: No action needed, pain <4.

## 2013-12-18 NOTE — Telephone Encounter (Signed)
  Follow up Call-  Call back number 12/17/2013  Post procedure Call Back phone  # [phone]  Permission to leave phone message Yes     Patient questions:  Do you have a fever, pain , or abdominal swelling? No. Pain Score  0 *  Have you tolerated food without any problems? Yes.    Have you been able to return to your normal activities? Yes.    Do you have any questions about your discharge instructions: Diet   No. Medications  No. Follow up visit  No.  Do you have questions or concerns about your Care? No.  Actions: * If pain score is 4 or above: No action needed, pain <4.  Recalled in error

## 2013-12-23 ENCOUNTER — Encounter: Payer: Self-pay | Admitting: Gastroenterology

## 2013-12-25 ENCOUNTER — Other Ambulatory Visit: Payer: Self-pay | Admitting: Internal Medicine

## 2014-04-02 ENCOUNTER — Other Ambulatory Visit: Payer: Self-pay | Admitting: Internal Medicine

## 2014-04-30 ENCOUNTER — Ambulatory Visit (INDEPENDENT_AMBULATORY_CARE_PROVIDER_SITE_OTHER): Payer: Medicare PPO | Admitting: Medical

## 2014-04-30 ENCOUNTER — Encounter: Payer: Self-pay | Admitting: Medical

## 2014-04-30 VITALS — BP 131/84 | HR 82 | Temp 98.3°F | Ht 64.0 in | Wt 154.8 lb

## 2014-04-30 DIAGNOSIS — R197 Diarrhea, unspecified: Secondary | ICD-10-CM

## 2014-04-30 NOTE — Patient Instructions (Addendum)
For your diarrhea for 2 weeks, I want to do stool panel studies. Please turn those in asap since results will come back quicker. Also will get labs today.  Presnently follow bland diet, rest and hydrate(propel). Immodium AD otc.   If your symptoms worsen severely pending lab results or over the weekend the UC evaluation.  Follow up 5 days or as needed.

## 2014-04-30 NOTE — Progress Notes (Signed)
Subjective:    Patient ID: Cynthia [name], female    DOB: 11/10/1945, 68 y.o.   MRN: [id number]  HPI  Pt in with some loose stool on and off for about 2 weeks. Pt states last Thursday, last Sunday those days very water stools about 5-6 times. Last night up have the majority of the night with very watery loose stools. The other days mild loose stools.   Pt denies any recent antibiotic use. No known family or friends with similar signs or symptoms. Pt had colonoscopy this fall and precancerous polyp. Told to follow up in 5 years. No hx of inflammatory bowel diseases. No hx of chrones.   Pt was tried brat diet. That seemed to help for short time. Yesterday when at some chicken and spinach then diarrhea restarted.   2 weeks ago she only speculates that she may have eaten something that she kept in her refrigerator. But she is not sure.  Past Medical History  Diagnosis Date  . Depression   . Constipation, chronic   . Headache(784.0)   . DEGENERATIVE JOINT DISEASE 06/02/2010  . PUD (peptic ulcer disease) 2012    Upper GI bleed d/t NSAIDs  . RLS (restless legs syndrome)     robaxin prn  . Glaucoma   . Allergy   . Anxiety   . Blood transfusion without reported diagnosis   . Hyperlipidemia     hx of  . Osteoporosis     osteopenia    History   Social History  . Marital Status: Married    Spouse Name: N/A    Number of Children: 3  . Years of Education: N/A   Occupational History  . 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 Smoking status: Never Smoker   . Smokeless tobacco: Never Used  . Alcohol Use: Yes     Comment: socially  . Drug Use: No  . Sexual Activity: Not on file   Other Topics Concern  . Not on file   Social History Narrative   2 step children, 1 child , [place]- patient and her husband, he recently stop driving      Past Surgical History  Procedure Laterality Date  . Cesarean section    . Ankle fracture surgery     surgery 12-11 (LEFT)    Family History  Problem Relation Age of Onset  . Aneurysm Mother     brain  . Diabetes Other     grandfather  . Hypertension Neg Hx   . Cancer Neg Hx   . Coronary artery disease Neg Hx   . Colon cancer Neg Hx   . Esophageal cancer Neg Hx   . Stomach cancer Neg Hx   . Rectal cancer Neg Hx     Allergies  Allergen Reactions  . Motrin [Ibuprofen]     GI bleed  . Statins     "delibitating pain all over body"  . Sulfonamide Derivatives     Pt unsure of reaction  . Ciprofloxacin Swelling and Rash    Current Outpatient Prescriptions on File Prior to Visit  Medication Sig Dispense Refill  . azelastine (ASTELIN) 137 MCG/SPRAY nasal spray instill 2 sprays into each nostril at bedtime 30 mL 6  . B COMPLEX VITAMINS PO Take by mouth daily.    Marland Kitchen buPROPion (WELLBUTRIN XL) 300 MG 24 hr tablet take 1 tablet by mouth once daily 90 tablet 3  . calcipotriene (  DOVONOX) 0.005 % cream     . calcium gluconate 500 MG tablet Take 1,000 mg by mouth daily.      . cetirizine (ZYRTEC) 10 MG tablet Take 10 mg by mouth daily.      . Cholecalciferol (VITAMIN D-400 PO) Take by mouth.      . citalopram (CELEXA) 20 MG tablet Take 1 tablet daily. OVER DUE FOR APPT WITH DR PAZ. NO FURTHER REFILLS. [phone]. 90 tablet 0  . HYDROcodone-acetaminophen (NORCO/VICODIN) 5-325 MG per tablet 1 tablet twice a day, 2 tablets at bedtime as needed 60 tablet 0  . methocarbamol (ROBAXIN) 500 MG tablet take 1 tablet by mouth three times a day if needed for RLS 90 tablet 3  . NON FORMULARY Tumeric daily    . Omega-3 Fatty Acids (FISH OIL) 1000 MG CPDR Take by mouth.      . triamcinolone cream (KENALOG) 0.1 %      No current facility-administered medications on file prior to visit.    BP 131/84 mmHg  Pulse 82  Temp(Src) 98.3 F (36.8 C) (Oral)  Ht 5\' 4"  (1.626 m)  Wt 154 lb 12.8 oz (70.217 kg)  BMI 26.56 kg/m2  SpO2 96%     Review of Systems  Constitutional: Negative for fever, chills and  fatigue.  Respiratory: Negative for cough, choking and wheezing.   Cardiovascular: Negative for chest pain and palpitations.  Gastrointestinal: Positive for diarrhea. Negative for nausea, vomiting, abdominal pain, constipation, blood in stool, abdominal distention, anal bleeding and rectal pain.  Genitourinary: Negative.   Musculoskeletal: Negative for back pain.  Skin: Negative for rash.  Neurological: Negative for dizziness, syncope, speech difficulty, weakness, light-headedness and numbness.  Hematological: Negative for adenopathy.  Psychiatric/Behavioral: Negative for confusion and sleep disturbance.       Objective:   Physical Exam   General Appearance- Not in acute distress.  HEENT Eyes- Scleraeral/Conjuntiva-bilat- Not Yellow. Mouth & Throat- Normal.  Chest and Lung Exam Auscultation: Breath sounds:-Normal. Adventitious sounds:- No Adventitious sounds.  Cardiovascular Auscultation:Rythm - Regular. Heart Sounds -Normal heart sounds.  Abdomen Inspection:-Inspection Normal.  Palpation/Perucssion: Palpation and Percussion of the abdomen reveal- Non Tender, No Rebound tenderness, No rigidity(Guarding) and No Palpable abdominal masses.  Liver:-Normal.  Spleen:- Normal.   Skin- moist not dry. No tenting.        Assessment & Plan:

## 2014-04-30 NOTE — Assessment & Plan Note (Signed)
For your diarrhea for 2 weeks, I want to do stool panel studies. Please turn those in asap since results will come back quicker. Also will get labs today.  Presnently follow bland diet, rest and hydrate(propel). Immodium AD otc.   If your symptoms worsen severely pending lab results or over the weekend the UC evaluation.

## 2014-04-30 NOTE — Progress Notes (Signed)
Pre visit review using our clinic review tool, if applicable. No additional management support is needed unless otherwise documented below in the visit note. 

## 2014-05-01 LAB — CBC WITH DIFFERENTIAL/PLATELET
BASOS PCT: 0.5 % (ref 0.0–3.0)
Basophils Absolute: 0 10*3/uL (ref 0.0–0.1)
EOS PCT: 3.5 % (ref 0.0–5.0)
Eosinophils Absolute: 0.3 10*3/uL (ref 0.0–0.7)
HEMATOCRIT: 38.6 % (ref 36.0–46.0)
HEMOGLOBIN: 12.6 g/dL (ref 12.0–15.0)
LYMPHS ABS: 1.2 10*3/uL (ref 0.7–4.0)
LYMPHS PCT: 16.4 % (ref 12.0–46.0)
MCHC: 32.7 g/dL (ref 30.0–36.0)
MCV: 94.6 fl (ref 78.0–100.0)
Monocytes Absolute: 0.7 10*3/uL (ref 0.1–1.0)
Monocytes Relative: 9.6 % (ref 3.0–12.0)
NEUTROS ABS: 5 10*3/uL (ref 1.4–7.7)
Neutrophils Relative %: 70 % (ref 43.0–77.0)
Platelets: 331 10*3/uL (ref 150.0–400.0)
RBC: 4.08 Mil/uL (ref 3.87–5.11)
RDW: 13 % (ref 11.5–15.5)
WBC: 7.1 10*3/uL (ref 4.0–10.5)

## 2014-05-01 LAB — COMPREHENSIVE METABOLIC PANEL
ALT: 13 U/L (ref 0–35)
AST: 20 U/L (ref 0–37)
Albumin: 4.4 g/dL (ref 3.5–5.2)
Alkaline Phosphatase: 63 U/L (ref 39–117)
BILIRUBIN TOTAL: 0.5 mg/dL (ref 0.2–1.2)
BUN: 11 mg/dL (ref 6–23)
CO2: 24 meq/L (ref 19–32)
CREATININE: 0.8 mg/dL (ref 0.4–1.2)
Calcium: 9.5 mg/dL (ref 8.4–10.5)
Chloride: 101 mEq/L (ref 96–112)
GFR: 71.27 mL/min (ref >60.00)
GLUCOSE: 76 mg/dL (ref 70–99)
Potassium: 4.7 mEq/L (ref 3.5–5.1)
Sodium: 136 mEq/L (ref 135–145)
Total Protein: 7 g/dL (ref 6.0–8.3)

## 2014-05-01 LAB — OVA AND PARASITE EXAMINATION: OP: NONE SEEN

## 2014-05-01 LAB — CLOSTRIDIUM DIFFICILE BY PCR: CDIFFPCR: NOT DETECTED

## 2014-05-04 LAB — STOOL CULTURE

## 2014-05-06 ENCOUNTER — Ambulatory Visit (INDEPENDENT_AMBULATORY_CARE_PROVIDER_SITE_OTHER): Payer: Medicare PPO | Admitting: Medical

## 2014-05-06 ENCOUNTER — Encounter: Payer: Self-pay | Admitting: Medical

## 2014-05-06 VITALS — BP 114/79 | HR 73 | Temp 98.7°F | Ht 64.0 in | Wt 153.8 lb

## 2014-05-06 DIAGNOSIS — R197 Diarrhea, unspecified: Secondary | ICD-10-CM

## 2014-05-06 MED ORDER — HYOSCYAMINE SULFATE ER 0.375 MG PO TB12: 0.3750 mg | ORAL_TABLET | Freq: Two times a day (BID) | ORAL | Status: DC

## 2014-05-06 MED ORDER — AMOXICILLIN-POT CLAVULANATE 875-125 MG PO TABS: 1.0000 | ORAL_TABLET | Freq: Two times a day (BID) | ORAL | Status: DC

## 2014-05-06 MED ORDER — METRONIDAZOLE 500 MG PO TABS: 500.0000 mg | ORAL_TABLET | Freq: Three times a day (TID) | ORAL | Status: DC

## 2014-05-06 NOTE — Assessment & Plan Note (Signed)
I was considering doing ct abdomen and pelvis today if your abdomen was tender on exam or if you had blood in your stool. You have neither so I decided against. Note also labs and stool studies all negative recently.  So I am going to prescribe levbid. This can treat ibs loose stool/diarrhea type.  If you diarrhea worsens despite above or any left lower quadrant pain then start augmentin and flagyl which I am making available.(limited course)   I am going to go ahead and refer you to GI. Hopefully they can see you within next 2 wks.  If you have any severe worsening signs or symptoms then ED evaluation.

## 2014-05-06 NOTE — Progress Notes (Signed)
Pre visit review using our clinic review tool, if applicable. No additional management support is needed unless otherwise documented below in the visit note. 

## 2014-05-06 NOTE — Patient Instructions (Addendum)
I was considering doing ct abdomen and pelvis today if your abdomen was tender on exam or if you had blood in your stool. You have neither so I decided against. Note also labs and stool studies all negative recently.  So I am going to prescribe levbid. This can treat ibs loose stool/diarrhea type.  If you diarrhea worsens despite above or any left lower quadrant pain then start augmentin and flagyl which I am making available.(limited course)   I am going to go ahead and refer you to GI. Hopefully they can see you within next 2 wks.  If you have any severe worsening signs or symptoms then ED evaluation.  Follow up in 7 days or as needed.(If you are doing significantly better then call us and notify us.)

## 2014-05-06 NOTE — Progress Notes (Signed)
Subjective:    Patient ID: Cynthia [name], female    DOB: 1945-12-21, 68 y.o.   MRN: [id number]  HPI   Pt in states she still is having loose stools. She was following bland diet for about a week before she saw me. Since this has been going on for some time so I did labs and stool studies. The labs and stool studies were negative. Pt states before she typically had constipation. Pt does not see a GI doctor regularly.   Some speculation in the past that she might have ibs.  Pt not having abdominal pain.  Today she feels much better.  Past Medical History  Diagnosis Date  . Depression   . Constipation, chronic   . Headache(784.0)   . DEGENERATIVE JOINT DISEASE 06/02/2010  . PUD (peptic ulcer disease) 2012    Upper GI bleed d/t NSAIDs  . RLS (restless legs syndrome)     robaxin prn  . Glaucoma   . Allergy   . Anxiety   . Blood transfusion without reported diagnosis   . Hyperlipidemia     hx of  . Osteoporosis     osteopenia    History   Social History  . Marital Status: Married    Spouse Name: N/A    Number of Children: 3  . Years of Education: N/A   Occupational History  . 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 Smoking status: Never Smoker   . Smokeless tobacco: Never Used  . Alcohol Use: Yes     Comment: socially  . Drug Use: No  . Sexual Activity: Not on file   Other Topics Concern  . Not on file   Social History Narrative   2 step children, 1 child , [place]- patient and her husband, he recently stop driving      Past Surgical History  Procedure Laterality Date  . Cesarean section    . Ankle fracture surgery      surgery 12-11 (LEFT)    Family History  Problem Relation Age of Onset  . Aneurysm Mother     brain  . Diabetes Other     grandfather  . Hypertension Neg Hx   . Cancer Neg Hx   . Coronary artery disease Neg Hx   . Colon cancer Neg Hx   . Esophageal cancer Neg Hx   . Stomach cancer Neg Hx   .  Rectal cancer Neg Hx     Allergies  Allergen Reactions  . Motrin [Ibuprofen]     GI bleed  . Statins     "delibitating pain all over body"  . Sulfonamide Derivatives     Pt unsure of reaction  . Ciprofloxacin Swelling and Rash    Current Outpatient Prescriptions on File Prior to Visit  Medication Sig Dispense Refill  . azelastine (ASTELIN) 137 MCG/SPRAY nasal spray instill 2 sprays into each nostril at bedtime 30 mL 6  . B COMPLEX VITAMINS PO Take by mouth daily.    . brimonidine-timolol (COMBIGAN) 0.2-0.5 % ophthalmic solution Place 1 drop into both eyes every 12 (twelve) hours.    Marland Kitchen buPROPion (WELLBUTRIN XL) 300 MG 24 hr tablet take 1 tablet by mouth once daily 90 tablet 3  . calcipotriene (DOVONOX) 0.005 % cream     . calcium gluconate 500 MG tablet Take 1,000 mg by mouth daily.      . cetirizine (ZYRTEC) 10 MG tablet Take  10 mg by mouth daily.      . Cholecalciferol (VITAMIN D-400 PO) Take by mouth.      . citalopram (CELEXA) 20 MG tablet Take 1 tablet daily. OVER DUE FOR APPT WITH DR PAZ. NO FURTHER REFILLS. [phone]. 90 tablet 0  . HYDROcodone-acetaminophen (NORCO/VICODIN) 5-325 MG per tablet 1 tablet twice a day, 2 tablets at bedtime as needed 60 tablet 0  . methocarbamol (ROBAXIN) 500 MG tablet take 1 tablet by mouth three times a day if needed for RLS 90 tablet 3  . NON FORMULARY Tumeric daily    . Omega-3 Fatty Acids (FISH OIL) 1000 MG CPDR Take by mouth.      . triamcinolone cream (KENALOG) 0.1 %      No current facility-administered medications on file prior to visit.    BP 114/79 mmHg  Pulse 73  Temp(Src) 98.7 F (37.1 C) (Oral)  Ht 5\' 4"  (1.626 m)  Wt 153 lb 12.8 oz (69.763 kg)  BMI 26.39 kg/m2  SpO2 98%       Review of Systems  Constitutional: Negative for fever, chills and fatigue.  Respiratory: Negative for cough, chest tightness, shortness of breath and wheezing.   Cardiovascular: Negative for chest pain and palpitations.  Gastrointestinal:  Positive for diarrhea. Negative for nausea, vomiting, abdominal pain and constipation.       Diarrhea when she gets off of bland diet.  Endocrine: Negative for polydipsia, polyphagia and polyuria.  Genitourinary: Negative for dysuria and flank pain.  Musculoskeletal: Negative for back pain.  Neurological: Negative for dizziness, tremors, seizures, syncope, weakness, light-headedness, numbness and headaches.  Hematological: Negative for adenopathy. Does not bruise/bleed easily.  Psychiatric/Behavioral: Negative for suicidal ideas, behavioral problems and dysphoric mood. The patient is not nervous/anxious.        Objective:   Physical Exam   General Appearance- Not in acute distress.  HEENT Eyes- Scleraeral/Conjuntiva-bilat- Not Yellow. Mouth & Throat- Normal.  Chest and Lung Exam Auscultation: Breath sounds:-Normal. Adventitious sounds:- No Adventitious sounds.  Cardiovascular Auscultation:Rythm - Regular. Heart Sounds -Normal heart sounds.  Abdomen Inspection:-Inspection Normal.  Palpation/Perucssion: Palpation and Percussion of the abdomen reveal- Non Tender, No Rebound tenderness, No rigidity(Guarding) and No Palpable abdominal masses.  Liver:-Normal.  Spleen:- Normal.   Rectal Anorectal Exam: Stool - Hemoccult of stool/mucous is Heme Negative. External - normal external exam. Internal - normal sphincter tone. No rectal mass.  Skin- moist no tenting.       Assessment & Plan:

## 2014-05-13 ENCOUNTER — Ambulatory Visit: Payer: Medicare PPO | Admitting: Medical

## 2014-05-18 ENCOUNTER — Ambulatory Visit (INDEPENDENT_AMBULATORY_CARE_PROVIDER_SITE_OTHER): Payer: Medicare PPO | Admitting: Physician Assistant

## 2014-05-18 ENCOUNTER — Encounter: Payer: Self-pay | Admitting: Physician Assistant

## 2014-05-18 VITALS — BP 100/68 | HR 80 | Ht 64.5 in | Wt 157.4 lb

## 2014-05-18 DIAGNOSIS — K5901 Slow transit constipation: Secondary | ICD-10-CM

## 2014-05-18 DIAGNOSIS — K219 Gastro-esophageal reflux disease without esophagitis: Secondary | ICD-10-CM

## 2014-05-18 DIAGNOSIS — Z86010 Personal history of colonic polyps: Secondary | ICD-10-CM

## 2014-05-18 NOTE — Progress Notes (Signed)
I agree with the above note, plan 

## 2014-05-18 NOTE — Patient Instructions (Signed)
Take Pepcid 20 mg over the counter 1 tab twice daily for 1 week then take 1 tab daily for 1 week, then take as needed.  Start a probiotic you can get over the counter: Align, Culturelle, Restora, FLorastor.   Take Miralax, 17 grams twice daily for 2-3 days then go to once daily every day.   Follow up with Dr. Owens Loffler as needed.

## 2014-05-18 NOTE — Progress Notes (Signed)
Patient ID: Cynthia [name], female   DOB: [birth year], 69 y.o.   MRN: [id number]   Subjective:    Patient ID: Cynthia [name], female    DOB: Sep 28, 1945, 69 y.o.   MRN: [id number]  HPI  Cynthia [name] is a pleasant 69 year old white female known to Dr. Ardis [name]. She had undergone colonoscopy in August 2015 for screening and had 1 tubular adenomatous polyp removed. Exam was otherwise negative with no diverticulosis noted. She also has history of depression, chronic constipation, osteopenia, and restless leg syndrome. She comes in today after an acute episode of a diarrheal illness onset about 4 weeks ago. She said her usual his chronic constipation which she has had for many years. He says the week after Thanksgiving she had onset of abdominal cramping and fairly watery diarrhea. She put herself on a very bland brat-type diet and when her symptoms persisted she saw her primary care provider. Initially she was given Levbid twice daily which seem to help and then on her return visit after stool cultures including stool for C. difficile returned negative she was given a course of Augmentin and Flagyl. She says she never picked up the antibiotics says she seemed to be getting better at this point the diarrhea has resolved and she is back to having constipation. She stopped taking the Levbid and continues to be constipated. She's also noticed a worsening and acid reflux symptoms over the past few weeks. She has not been on any other new medications no regular NSAIDs. She has not required any prescription treatment for reflux in the past.   Review of Systems Pertinent positive and negative review of systems were noted in the above HPI section.  All other review of systems was otherwise negative.  Outpatient Encounter Prescriptions as of 05/18/2014  Medication Sig  . azelastine (ASTELIN) 137 MCG/SPRAY nasal spray instill 2 sprays into each nostril at bedtime  . B COMPLEX VITAMINS PO Take by mouth daily.  .  brimonidine-timolol (COMBIGAN) 0.2-0.5 % ophthalmic solution Place 1 drop into both eyes every 12 (twelve) hours.  Marland Kitchen buPROPion (WELLBUTRIN XL) 300 MG 24 hr tablet take 1 tablet by mouth once daily  . calcipotriene (DOVONOX) 0.005 % cream   . calcium gluconate 500 MG tablet Take 1,000 mg by mouth daily.    . cetirizine (ZYRTEC) 10 MG tablet Take 10 mg by mouth daily.    . Cholecalciferol (VITAMIN D-400 PO) Take by mouth.    . citalopram (CELEXA) 20 MG tablet Take 1 tablet daily. OVER DUE FOR APPT WITH DR PAZ. NO FURTHER REFILLS. [phone].  Marland Kitchen HYDROcodone-acetaminophen (NORCO/VICODIN) 5-325 MG per tablet 1 tablet twice a day, 2 tablets at bedtime as needed  . hyoscyamine (LEVBID) 0.375 MG 12 hr tablet Take 1 tablet (0.375 mg total) by mouth 2 (two) times daily.  . methocarbamol (ROBAXIN) 500 MG tablet take 1 tablet by mouth three times a day if needed for RLS  . NON FORMULARY Tumeric daily  . Omega-3 Fatty Acids (FISH OIL) 1000 MG CPDR Take by mouth.    . triamcinolone cream (KENALOG) 0.1 %   . [DISCONTINUED] amoxicillin-clavulanate (AUGMENTIN) 875-125 MG per tablet Take 1 tablet by mouth 2 (two) times daily.  . [DISCONTINUED] metroNIDAZOLE (FLAGYL) 500 MG tablet Take 1 tablet (500 mg total) by mouth 3 (three) times daily.   Allergies  Allergen Reactions  . Motrin [Ibuprofen]     GI bleed  . Pollen Extract   . Statins     "delibitating pain all over  body"  . Sulfonamide Derivatives     Pt unsure of reaction  . Ciprofloxacin Swelling and Rash   Patient Active Problem List   Diagnosis Date Noted  . H/O adenomatous polyp of colon 05/18/2014  . Diarrhea 04/30/2014  . Knee injury 04/06/2013  . Annual physical exam 03/13/2011  . Osteopenia   . DEGENERATIVE JOINT DISEASE 06/02/2010  . HYPERLIPIDEMIA 11/09/2009  . HEADACHE 06/28/2009  . CONSTIPATION, CHRONIC 02/01/2007  . DEPRESSION 08/17/2006  . ALLERGIC RHINITIS 08/17/2006   History   Social History  . Marital Status: Married     Spouse Name: N/A    Number of Children: 1  . Years of Education: N/A   Occupational History  . 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 Smoking status: Never Smoker   . Smokeless tobacco: Never Used  . Alcohol Use: Yes     Comment: socially  . Drug Use: No  . Sexual Activity: Not on file   Other Topics Concern  . Not on file   Social History Narrative   2 step children, 1 child , [place]- patient and her husband, he recently stop driving      Cynthia [name]'s family history includes Aneurysm in her mother; Celiac disease in her brother; Diabetes in her maternal grandfather. There is no history of Hypertension, Cancer, Coronary artery disease, Colon cancer, Esophageal cancer, Stomach cancer, or Rectal cancer.      Objective:    Filed Vitals:   05/18/14 0931  BP: 100/68  Pulse: 80    Physical Exam  well-developed older white female in no acute distress, pleasant vitals as above height 5 foot 4 weight 157. HEENT; nontraumatic normocephalic EOMI PERRLA sclera anicteric, Neck; supple no JVD, Cardiovascular; regular rate and rhythm with S1-S2 no murmur or gallop, Pulmonary ;clear bilaterally, Abdomen; soft bowel sounds are active she has some mild tenderness in the suprapubic area no guarding or rebound no palpable mass or hepatosplenomegaly, Rectal ;exam not done, Extremities; no clubbing cyanosis or edema skin warm and dry, Psych; mood and affect appropriate       Assessment & Plan:   #70 69 year old female with history of chronic constipation with an acute diarrheal illness lasting 3-4 weeks and now resolved. This may have been a prolonged viral syndrome. Patient now back to chronic constipation. #2 GERD #3 history of tubular adenomatous colon polyp colonoscopy August 2015 due for follow-up 2020 Plan Liberal oral fluid intake Advise she take MiraLAX 17 g in 8 ounces of water twice daily over the next several days until she can get her bowels moving  and then continue on MiraLAX 17 g daily on a regular basis. Start a probiotic daily, suggested align florastor or restora She may use Levbid on a when necessary basis for abdominal cramping or discomfort Start a course of Pepcid 20 mg by mouth twice daily 1 week and once daily 1 week then as needed She'll follow up with Dr. Ardis [name] or myself on an as-needed basis.  Shanicka Oldenkamp S [place] Souter PA-C 05/18/2014

## 2014-05-20 ENCOUNTER — Other Ambulatory Visit: Payer: Self-pay | Admitting: Internal Medicine

## 2014-06-04 DIAGNOSIS — H4011X1 Primary open-angle glaucoma, mild stage: Secondary | ICD-10-CM | POA: Diagnosis not present

## 2014-06-04 DIAGNOSIS — H2513 Age-related nuclear cataract, bilateral: Secondary | ICD-10-CM | POA: Diagnosis not present

## 2014-07-03 ENCOUNTER — Ambulatory Visit (INDEPENDENT_AMBULATORY_CARE_PROVIDER_SITE_OTHER): Payer: Medicare PPO | Admitting: Internal Medicine

## 2014-07-03 ENCOUNTER — Encounter: Payer: Self-pay | Admitting: Internal Medicine

## 2014-07-03 VITALS — BP 108/72 | HR 72 | Temp 97.8°F | Ht 65.0 in | Wt 159.4 lb

## 2014-07-03 DIAGNOSIS — Z0000 Encounter for general adult medical examination without abnormal findings: Secondary | ICD-10-CM

## 2014-07-03 DIAGNOSIS — M8580 Other specified disorders of bone density and structure, unspecified site: Secondary | ICD-10-CM

## 2014-07-03 DIAGNOSIS — F329 Major depressive disorder, single episode, unspecified: Secondary | ICD-10-CM

## 2014-07-03 DIAGNOSIS — E785 Hyperlipidemia, unspecified: Secondary | ICD-10-CM

## 2014-07-03 DIAGNOSIS — F32A Depression, unspecified: Secondary | ICD-10-CM

## 2014-07-03 DIAGNOSIS — Z23 Encounter for immunization: Secondary | ICD-10-CM

## 2014-07-03 MED ORDER — CITALOPRAM HYDROBROMIDE 20 MG PO TABS: ORAL_TABLET | ORAL | Status: DC

## 2014-07-03 NOTE — Assessment & Plan Note (Signed)
On diet control, check labs

## 2014-07-03 NOTE — Progress Notes (Signed)
Pre visit review using our clinic review tool, if applicable. No additional management support is needed unless otherwise documented below in the visit note. 

## 2014-07-03 NOTE — Patient Instructions (Signed)
Stop by the front desk and schedule labs to be done within few days (fasting)   Please come back to the office in 1 year  for a physical exam. Come back fasting        Fall Prevention and Home Safety Falls cause injuries and can affect all age groups. It is possible to use preventive measures to significantly decrease the likelihood of falls. There are many simple measures which can make your home safer and prevent falls. OUTDOORS  Repair cracks and edges of walkways and driveways.  Remove high doorway thresholds.  Trim shrubbery on the main path into your home.  Have good outside lighting.  Clear walkways of tools, rocks, debris, and clutter.  Check that handrails are not broken and are securely fastened. Both sides of steps should have handrails.  Have leaves, snow, and ice cleared regularly.  Use sand or salt on walkways during winter months.  In the garage, clean up grease or oil spills. BATHROOM  Install night lights.  Install grab bars by the toilet and in the tub and shower.  Use non-skid mats or decals in the tub or shower.  Place a plastic non-slip stool in the shower to sit on, if needed.  Keep floors dry and clean up all water on the floor immediately.  Remove soap buildup in the tub or shower on a regular basis.  Secure bath mats with non-slip, double-sided rug tape.  Remove throw rugs and tripping hazards from the floors. BEDROOMS  Install night lights.  Make sure a bedside light is easy to reach.  Do not use oversized bedding.  Keep a telephone by your bedside.  Have a firm chair with side arms to use for getting dressed.  Remove throw rugs and tripping hazards from the floor. KITCHEN  Keep handles on pots and pans turned toward the center of the stove. Use back burners when possible.  Clean up spills quickly and allow time for drying.  Avoid walking on wet floors.  Avoid hot utensils and knives.  Position shelves so they are not too  high or low.  Place commonly used objects within easy reach.  If necessary, use a sturdy step stool with a grab bar when reaching.  Keep electrical cables out of the way.  Do not use floor polish or wax that makes floors slippery. If you must use wax, use non-skid floor wax.  Remove throw rugs and tripping hazards from the floor. STAIRWAYS  Never leave objects on stairs.  Place handrails on both sides of stairways and use them. Fix any loose handrails. Make sure handrails on both sides of the stairways are as long as the stairs.  Check carpeting to make sure it is firmly attached along stairs. Make repairs to worn or loose carpet promptly.  Avoid placing throw rugs at the top or bottom of stairways, or properly secure the rug with carpet tape to prevent slippage. Get rid of throw rugs, if possible.  Have an electrician put in a light switch at the top and bottom of the stairs. OTHER FALL PREVENTION TIPS  Wear low-heel or rubber-soled shoes that are supportive and fit well. Wear closed toe shoes.  When using a stepladder, make sure it is fully opened and both spreaders are firmly locked. Do not climb a closed stepladder.  Add color or contrast paint or tape to grab bars and handrails in your home. Place contrasting color strips on first and last steps.  Learn and use mobility aids  as needed. Install an electrical emergency response system.  Turn on lights to avoid dark areas. Replace light bulbs that burn out immediately. Get light switches that glow.  Arrange furniture to create clear pathways. Keep furniture in the same place.  Firmly attach carpet with non-skid or double-sided tape.  Eliminate uneven floor surfaces.  Select a carpet pattern that does not visually hide the edge of steps.  Be aware of all pets. OTHER HOME SAFETY TIPS  Set the water temperature for 120 F (48.8 C).  Keep emergency numbers on or near the telephone.  Keep smoke detectors on every level  of the home and near sleeping areas. Document Released: 04/21/2002 Document Revised: 10/31/2011 Document Reviewed: 07/21/2011 [facility] Patient Information 2015 [place], [place]. This information is not intended to replace advice given to you by your health care provider. Make sure you discuss any questions you have with your health care provider.   Preventive Care for Adults Ages 21 and over  Blood pressure check.** / Every 1 to 2 years.  Lipid and cholesterol check.**/ Every 5 years beginning at age 33.  Lung cancer screening. / Every year if you are aged 58-80 years and have a 30-pack-year history of smoking and currently smoke or have quit within the past 15 years. Yearly screening is stopped once you have quit smoking for at least 15 years or develop a health problem that would prevent you from having lung cancer treatment.  Fecal occult blood test (FOBT) of stool. / Every year beginning at age 10 and continuing until age 27. You may not have to do this test if you get a colonoscopy every 10 years.  Flexible sigmoidoscopy** or colonoscopy.** / Every 5 years for a flexible sigmoidoscopy or every 10 years for a colonoscopy beginning at age 59 and continuing until age 64.  Hepatitis C blood test.** / For all people born from [birth year] through 1965 and any individual with known risks for hepatitis C.  Abdominal aortic aneurysm (AAA) screening.** / A one-time screening for ages 39 to 52 years who are current or former smokers.  Skin self-exam. / Monthly.  Influenza vaccine. / Every year.  Tetanus, diphtheria, and acellular pertussis (Tdap/Td) vaccine.** / 1 dose of Td every 10 years.  Varicella vaccine.** / Consult your health care provider.  Zoster vaccine.** / 1 dose for adults aged 98 years or older.  Pneumococcal 13-valent conjugate (PCV13) vaccine.** / Consult your health care provider.  Pneumococcal polysaccharide (PPSV23) vaccine.** / 1 dose for all adults aged 71 years and  older.  Meningococcal vaccine.** / Consult your health care provider.  Hepatitis A vaccine.** / Consult your health care provider.  Hepatitis B vaccine.** / Consult your health care provider.  Haemophilus influenzae type b (Hib) vaccine.** / Consult your health care provider. **Family history and personal history of risk and conditions may change your health care provider's recommendations. Document Released: 06/27/2001 Document Revised: 05/06/2013 Document Reviewed: 09/26/2010 [facility] Patient Information 2015 [place], [place]. This information is not intended to replace advice given to you by your health care provider. Make sure you discuss any questions you have with your health care provider.

## 2014-07-03 NOTE — Progress Notes (Signed)
Subjective:    Patient ID: Cynthia [name], female    DOB: [birth year], 69 y.o.   MRN: [id number]  DOS:  07/03/2014 Type of visit - description :   Here for Medicare AWV: 1.Risk factors based on Past M, S, F history: reviewed 2.Physical Activities: active at home   3.Depression/mood: No problemss noted or reported   4.Hearing: no problems noted or reported  5.ADL's: totally independent, drives  6.Fall Risk: no recent falls, prevention discussed  7.Home Safety: Feels safe at home 8.Height, weight, &visual acuity: see vital signs, has pre-glaucoma, sees ophthalmology q 6 months  9. Counseling: see below 10. Labs ordered based on risk factors: If needed  11. Referral Coordination --if needed 12. Care Plan-- discussed w/ patient plan ofcare for the next 12 months , written plan provided  13.Cognitive Assessment-- cognition and memory seem within normal 14. Care team updated 15.  advance directives -- has a living will, rec to bring [place] a copy   in addition to her physical we assessed the following problems Good compliance with Wellbutrin and citalopram, symptoms well-controlled Developed   cough a week ago, occasional green sputum, no hemoptysis, taking over-the-counter cough medication. History of psoriasis, symptoms are currently well controlled. History of RLS,  problem currently silent, on Robaxin as needed. Osteopenia, on calcium and vitamin D, not exercising much  Review of Systems  Constitutional: No fever, chills. No unexplained wt changes. No unusual sweats HEENT: No dental problems, ear discharge, facial swelling, voice changes. No eye discharge, redness or intolerance to light. No runny nose, sore throat, occasional sinus pain and congestion for the last few days. Respiratory: No wheezing or difficulty breathing.  Cardiovascular: No CP, leg swelling or palpitations GI: no nausea, vomiting, diarrhea or abdominal pain.  No blood in the stools. No dysphagia     Endocrine: No polyphagia, polyuria or polydipsia GU: No dysuria, gross hematuria, difficulty urinating. No urinary urgency or frequency. Denies vaginal bleeding, rash or discharge Musculoskeletal: No joint swellings or unusual aches or pains Skin: No change in the color of the skin, palor or rash Allergic, immunologic: No environmental allergies or food allergies Neurological: No dizziness or syncope. No headaches. No diplopia, slurred speech, motor deficits, facial numbness Hematological: No enlarged lymph nodes, easy bruising or bleeding Psychiatry: No suicidal ideas, hallucinations, behavior problems or confusion. No unusual/severe anxiety or depression.    Past Medical History  Diagnosis Date  . Depression   . Constipation, chronic   . Headache(784.0)   . DEGENERATIVE JOINT DISEASE 06/02/2010  . PUD (peptic ulcer disease) 2012    Upper GI bleed d/t NSAIDs  . RLS (restless legs syndrome)     robaxin prn  . Glaucoma   . Allergy   . Anxiety   . Blood transfusion without reported diagnosis   . Hyperlipidemia     hx of  . Osteopenia   . Arthritis   . Psoriasis     Past Surgical History  Procedure Laterality Date  . Cesarean section  1969  . Ankle fracture surgery Left     surgery 12-11 (LEFT)    History   Social History  . Marital Status: Married    Spouse Name: N/A  . Number of Children: 1  . Years of Education: N/A   Occupational History  . 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 Smoking status: Never Smoker   . Smokeless tobacco: Never Used  . Alcohol Use: Yes  Comment: socially  . Drug Use: No  . Sexual Activity: Not on file   Other Topics Concern  . Not on file   Social History Narrative   2 step children, 1 child , 7 GK   Household- patient and her husband, he recently stop driving       Family History  Problem Relation Age of Onset  . Aneurysm Mother     brain  . Hypertension Neg Hx   . Breast cancer Neg Hx   .  Coronary artery disease Neg Hx   . Colon cancer Neg Hx   . Esophageal cancer Neg Hx   . Stomach cancer Neg Hx   . Rectal cancer Neg Hx   . Celiac disease Brother   . Diabetes Maternal Grandfather        Medication List       This list is accurate as of: 07/03/14 11:59 PM.  Always use your most recent med list.               azelastine 0.1 % nasal spray  Commonly known as:  ASTELIN  instill 2 sprays into each nostril at bedtime     B COMPLEX VITAMINS PO  Take by mouth daily.     buPROPion 300 MG 24 hr tablet  Commonly known as:  WELLBUTRIN XL  take 1 tablet by mouth once daily     calcipotriene 0.005 % cream  Commonly known as:  DOVONOX     calcium gluconate 500 MG tablet  Take 1,000 mg by mouth daily.     cetirizine 10 MG tablet  Commonly known as:  ZYRTEC  Take 10 mg by mouth daily.     citalopram 20 MG tablet  Commonly known as:  CELEXA  Take 1 tablet daily.     COMBIGAN 0.2-0.5 % ophthalmic solution  Generic drug:  brimonidine-timolol  Place 1 drop into both eyes every 12 (twelve) hours.     Fish Oil 1000 MG Cpdr  Take by mouth.     HYDROcodone-acetaminophen 5-325 MG per tablet  Commonly known as:  NORCO/VICODIN  1 tablet twice a day, 2 tablets at bedtime as needed     methocarbamol 500 MG tablet  Commonly known as:  ROBAXIN  take 1 tablet by mouth three times a day if needed for RLS     NON FORMULARY  Tumeric daily     triamcinolone cream 0.1 %  Commonly known as:  KENALOG     VITAMIN D-400 PO  Take by mouth.           Objective:   Physical Exam BP 108/72 mmHg  Pulse 72  Temp(Src) 97.8 F (36.6 C) (Oral)  Ht 5\' 5"  (1.651 m)  Wt 159 lb 6 oz (72.292 kg)  BMI 26.52 kg/m2  SpO2 96%  General:   Well developed, well nourished . NAD.  Neck:  Full range of motion. Supple. No  thyromegaly , normal carotid pulse . HEENT:  Normocephalic . Face symmetric, atraumatic Breast: no dominant mass, skin and nipples normal to inspection on  palpation, axillary areas without mass or lymphadenopathy  Lungs:  CTA B Normal respiratory effort, no intercostal retractions, no accessory muscle use. Heart: RRR,  no murmur.  Abdomen:  Not distended, soft, non-tender. No rebound or rigidity. No mass,organomegaly Muscle skeletal: no pretibial edema bilaterally  Skin: Exposed areas without rash. Not pale. Not jaundice Neurologic:  alert & oriented X3.  Speech normal, gait appropriate for age and unassisted  Strength symmetric and appropriate for age.  Psych: Cognition and judgment appear intact.  Cooperative with normal attention span and concentration.  Behavior appropriate. No anxious or depressed appearing.       Assessment & Plan:    Cough, lung exam is clear, continue taking Robitussin-DM and let me know if she is not improving

## 2014-07-03 NOTE — Assessment & Plan Note (Signed)
Well-controlled with Wellbutrin and citalopram, refill as needed, follow-up one year

## 2014-07-03 NOTE — Assessment & Plan Note (Signed)
Continue calcium and vitamin D, will check a bone density test. She has not been as active lately

## 2014-07-03 NOTE — Assessment & Plan Note (Addendum)
Td   2011 shingles shot 2009 pneumonia shot--2011 prevnar-- today Had a flu shot  Colonoscopy: 05/15/2002, cscope 8-15, 1 polyp, 5 years   Never had an abnormal PAP, married twice; PAP 11-09, 10-2009 neg . Does not desire more PAPs, per guidelines that is ok MMG 11-2013 (-), breast exam today negative  Diet-exercise discussed

## 2014-07-08 ENCOUNTER — Other Ambulatory Visit (INDEPENDENT_AMBULATORY_CARE_PROVIDER_SITE_OTHER): Payer: Medicare PPO

## 2014-07-08 DIAGNOSIS — E785 Hyperlipidemia, unspecified: Secondary | ICD-10-CM

## 2014-07-08 LAB — LIPID PANEL
CHOLESTEROL: 259 mg/dL — AB (ref 0–200)
HDL: 67.3 mg/dL (ref >39.00)
LDL Cholesterol: 171 mg/dL — ABNORMAL HIGH (ref 0–99)
NonHDL: 191.7
TRIGLYCERIDES: 103 mg/dL (ref 0.0–149.0)
Total CHOL/HDL Ratio: 4
VLDL: 20.6 mg/dL (ref 0.0–40.0)

## 2014-07-08 LAB — TSH: TSH: 2.41 u[IU]/mL (ref 0.35–4.50)

## 2014-07-20 ENCOUNTER — Ambulatory Visit (INDEPENDENT_AMBULATORY_CARE_PROVIDER_SITE_OTHER)
Admission: RE | Admit: 2014-07-20 | Discharge: 2014-07-20 | Disposition: A | Discharge status: Home / Self Care | Payer: Medicare PPO | Source: Ambulatory Visit | Attending: Internal Medicine | Admitting: Internal Medicine

## 2014-07-20 DIAGNOSIS — M8580 Other specified disorders of bone density and structure, unspecified site: Secondary | ICD-10-CM

## 2014-09-04 ENCOUNTER — Other Ambulatory Visit: Payer: Self-pay

## 2014-09-04 MED ORDER — BUPROPION HCL ER (XL) 300 MG PO TB24: 300.0000 mg | ORAL_TABLET | Freq: Every day | ORAL | Status: DC

## 2014-09-21 ENCOUNTER — Other Ambulatory Visit: Payer: Self-pay

## 2014-09-21 MED ORDER — AZELASTINE HCL 0.1 % NA SOLN: 2.0000 | Freq: Every day | NASAL | Status: DC

## 2014-11-09 DIAGNOSIS — E785 Hyperlipidemia, unspecified: Secondary | ICD-10-CM | POA: Diagnosis not present

## 2014-11-09 DIAGNOSIS — G2581 Restless legs syndrome: Secondary | ICD-10-CM | POA: Diagnosis not present

## 2014-11-09 DIAGNOSIS — J302 Other seasonal allergic rhinitis: Secondary | ICD-10-CM | POA: Diagnosis not present

## 2014-11-09 DIAGNOSIS — K5900 Constipation, unspecified: Secondary | ICD-10-CM | POA: Diagnosis not present

## 2014-11-09 DIAGNOSIS — L409 Psoriasis, unspecified: Secondary | ICD-10-CM | POA: Diagnosis not present

## 2014-11-09 DIAGNOSIS — M545 Low back pain: Secondary | ICD-10-CM | POA: Diagnosis not present

## 2014-11-09 DIAGNOSIS — H409 Unspecified glaucoma: Secondary | ICD-10-CM | POA: Diagnosis not present

## 2014-11-09 DIAGNOSIS — F418 Other specified anxiety disorders: Secondary | ICD-10-CM | POA: Diagnosis not present

## 2014-11-09 DIAGNOSIS — H3532 Exudative age-related macular degeneration: Secondary | ICD-10-CM | POA: Diagnosis not present

## 2014-12-04 DIAGNOSIS — H4011X2 Primary open-angle glaucoma, moderate stage: Secondary | ICD-10-CM | POA: Diagnosis not present

## 2014-12-24 ENCOUNTER — Other Ambulatory Visit: Payer: Self-pay

## 2014-12-25 ENCOUNTER — Ambulatory Visit (INDEPENDENT_AMBULATORY_CARE_PROVIDER_SITE_OTHER): Payer: Medicare PPO | Admitting: Internal Medicine

## 2014-12-25 ENCOUNTER — Encounter: Payer: Self-pay | Admitting: Internal Medicine

## 2014-12-25 VITALS — BP 128/82 | HR 73 | Temp 98.3°F | Ht 65.0 in | Wt 160.4 lb

## 2014-12-25 DIAGNOSIS — R059 Cough, unspecified: Secondary | ICD-10-CM

## 2014-12-25 DIAGNOSIS — R05 Cough: Secondary | ICD-10-CM

## 2014-12-25 MED ORDER — AMOXICILLIN 500 MG PO CAPS: 1000.0000 mg | ORAL_CAPSULE | Freq: Two times a day (BID) | ORAL | Status: DC

## 2014-12-25 MED ORDER — OMEPRAZOLE 20 MG PO CPDR: 20.0000 mg | DELAYED_RELEASE_CAPSULE | Freq: Every day | ORAL | Status: DC

## 2014-12-25 NOTE — Patient Instructions (Signed)
Add OTC Flonase 2 sprays on each side of the nose every morning Nasal irrigation daily  Omeprazole 20 mg one tablet before breakfast every day  Amoxicillin for 10 days Mucinex DM twice a day for 10 days then as needed  Call if not improving in the next few weeks

## 2014-12-25 NOTE — Progress Notes (Signed)
Pre visit review using our clinic review tool, if applicable. No additional management support is needed unless otherwise documented below in the visit note. 

## 2014-12-25 NOTE — Progress Notes (Signed)
Subjective:    Patient ID: Cynthia [name], female    DOB: 01/24/1946, 69 y.o.   MRN: [id number]  DOS:  12/25/2014 Type of visit - description : Acute   Interval history:  3-4 months history of cough, usually early in the morning and late in the afternoon, usually dry unnless she takes OTC cough meds and then she sees some sputum greenish or yellow in color. In the last few days she experiences mild L side  ear ache.   Review of Systems No fever or chills No weight loss Denies any itchy eyes or itchy nose. Admits to some postnasal dripping and L sided maxillary and frontal pain. No nausea, vomiting. Admits to heartburn most days. No wheezing. Patient never smoked.   Past Medical History  Diagnosis Date  . Depression   . Constipation, chronic   . Headache(784.0)   . DEGENERATIVE JOINT DISEASE 06/02/2010  . PUD (peptic ulcer disease) 2012    Upper GI bleed d/t NSAIDs  . RLS (restless legs syndrome)     robaxin prn  . Glaucoma   . Allergy   . Anxiety   . Blood transfusion without reported diagnosis   . Hyperlipidemia     hx of  . Osteopenia   . Arthritis   . Psoriasis     Past Surgical History  Procedure Laterality Date  . Cesarean section  1969  . Ankle fracture surgery Left     surgery 12-11 (LEFT)    Social History   Social History  . Marital Status: Married    Spouse Name: N/A  . Number of Children: 1  . Years of Education: N/A   Occupational History  . 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 Smoking status: Never Smoker   . Smokeless tobacco: Never Used  . Alcohol Use: Yes     Comment: socially  . Drug Use: No  . Sexual Activity: Not on file   Other Topics Concern  . Not on file   Social History Narrative   2 step children, 1 child , [place]- patient and her husband, he recently stop driving          Medication List       This list is accurate as of: 12/25/14  1:06 PM.  Always use your most recent med list.                amoxicillin 500 MG capsule  Commonly known as:  AMOXIL  Take 2 capsules (1,000 mg total) by mouth 2 (two) times daily.     azelastine 0.1 % nasal spray  Commonly known as:  ASTELIN  Place 2 sprays into both nostrils at bedtime.     B COMPLEX VITAMINS PO  Take by mouth daily.     buPROPion 300 MG 24 hr tablet  Commonly known as:  WELLBUTRIN XL  Take 1 tablet (300 mg total) by mouth daily.     calcipotriene 0.005 % cream  Commonly known as:  DOVONOX     calcium gluconate 500 MG tablet  Take 1,000 mg by mouth daily.     cetirizine 10 MG tablet  Commonly known as:  ZYRTEC  Take 10 mg by mouth daily.     citalopram 20 MG tablet  Commonly known as:  CELEXA  Take 1 tablet daily.     COMBIGAN 0.2-0.5 % ophthalmic solution  Generic drug:  brimonidine-timolol  Place 1 drop  into the right eye every 12 (twelve) hours.     Fish Oil 1000 MG Cpdr  Take by mouth.     methocarbamol 500 MG tablet  Commonly known as:  ROBAXIN  take 1 tablet by mouth three times a day if needed for RLS     omeprazole 20 MG capsule  Commonly known as:  PRILOSEC  Take 1 capsule (20 mg total) by mouth daily.     triamcinolone cream 0.1 %  Commonly known as:  KENALOG     TURMERIC PO  Take 1 tablet by mouth daily.     VITAMIN D-400 PO  Take by mouth.           Objective:   Physical Exam BP 128/82 mmHg  Pulse 73  Temp(Src) 98.3 F (36.8 C) (Oral)  Ht 5\' 5"  (1.651 m)  Wt 160 lb 6 oz (72.746 kg)  BMI 26.69 kg/m2  SpO2 96% General:   Well developed, well nourished . NAD.  HEENT:  Normocephalic . Face symmetric, atraumatic. TMs normal, nose is slightly congested, maxillary sinuses are slightly tender on the left. Neck: No LADs Lungs:  CTA B Normal respiratory effort, no intercostal retractions, no accessory muscle use. Heart: RRR,  no murmur.  No pretibial edema bilaterally  Skin: Not pale. Not jaundice Neurologic:  alert & oriented X3.  Speech normal, gait  appropriate for age and unassisted Psych--  Cognition and judgment appear intact.  Cooperative with normal attention span and concentration.  Behavior appropriate. No anxious or depressed appearing.      Assessment & Plan:   Cough: Likely multifactorial based on history and physical. (Allergies, GERD, low-grade sinusitis?) Will increase treatment of allergies with Flonase and nasal irrigation Treat for possible sinusitis with amoxicillin and Mucinex DM Treat GERD with omeprazole every morning. (will rx a low-dose omeprazole, she's taking citalopram) Patient will call if not improving in one month

## 2015-02-15 ENCOUNTER — Ambulatory Visit (INDEPENDENT_AMBULATORY_CARE_PROVIDER_SITE_OTHER): Payer: Medicare PPO

## 2015-02-15 DIAGNOSIS — Z23 Encounter for immunization: Secondary | ICD-10-CM

## 2015-02-18 ENCOUNTER — Ambulatory Visit: Payer: Medicare PPO

## 2015-03-12 DIAGNOSIS — L400 Psoriasis vulgaris: Secondary | ICD-10-CM | POA: Diagnosis not present

## 2015-03-24 ENCOUNTER — Encounter: Payer: Self-pay | Admitting: Internal Medicine

## 2015-03-24 ENCOUNTER — Ambulatory Visit (INDEPENDENT_AMBULATORY_CARE_PROVIDER_SITE_OTHER): Payer: Medicare PPO | Admitting: Internal Medicine

## 2015-03-24 VITALS — BP 124/72 | HR 76 | Temp 98.3°F | Ht 65.0 in | Wt 163.5 lb

## 2015-03-24 DIAGNOSIS — R51 Headache: Secondary | ICD-10-CM | POA: Diagnosis not present

## 2015-03-24 DIAGNOSIS — Z09 Encounter for follow-up examination after completed treatment for conditions other than malignant neoplasm: Secondary | ICD-10-CM

## 2015-03-24 DIAGNOSIS — R519 Headache, unspecified: Secondary | ICD-10-CM

## 2015-03-24 LAB — CBC WITH DIFFERENTIAL/PLATELET
BASOS PCT: 0.9 % (ref 0.0–3.0)
Basophils Absolute: 0.1 10*3/uL (ref 0.0–0.1)
EOS ABS: 0.4 10*3/uL (ref 0.0–0.7)
EOS PCT: 6.1 % — AB (ref 0.0–5.0)
HEMATOCRIT: 37.4 % (ref 36.0–46.0)
HEMOGLOBIN: 12.4 g/dL (ref 12.0–15.0)
LYMPHS PCT: 24.3 % (ref 12.0–46.0)
Lymphs Abs: 1.4 10*3/uL (ref 0.7–4.0)
MCHC: 33.2 g/dL (ref 30.0–36.0)
MCV: 93.3 fl (ref 78.0–100.0)
Monocytes Absolute: 0.8 10*3/uL (ref 0.1–1.0)
Monocytes Relative: 13.7 % — ABNORMAL HIGH (ref 3.0–12.0)
Neutro Abs: 3.2 10*3/uL (ref 1.4–7.7)
Neutrophils Relative %: 55 % (ref 43.0–77.0)
Platelets: 399 10*3/uL (ref 150.0–400.0)
RBC: 4.01 Mil/uL (ref 3.87–5.11)
RDW: 13.1 % (ref 11.5–15.5)
WBC: 5.8 10*3/uL (ref 4.0–10.5)

## 2015-03-24 LAB — BASIC METABOLIC PANEL
BUN: 9 mg/dL (ref 6–23)
CO2: 27 meq/L (ref 19–32)
Calcium: 9.9 mg/dL (ref 8.4–10.5)
Chloride: 98 mEq/L (ref 96–112)
Creatinine, Ser: 0.77 mg/dL (ref 0.40–1.20)
GFR: 78.6 mL/min (ref >60.00)
Glucose, Bld: 89 mg/dL (ref 70–99)
Potassium: 4.3 mEq/L (ref 3.5–5.1)
SODIUM: 135 meq/L (ref 135–145)

## 2015-03-24 LAB — SEDIMENTATION RATE: SED RATE: 62 mm/h — AB (ref 0–22)

## 2015-03-24 NOTE — Patient Instructions (Addendum)
Get your blood work before you leave   Use Astelin twice a day Mucinex DM daily  Flonase in the mornings consistently    Will schedule a CT  Tylenol 500 mg 2 tablets every 6 hours as needed  Next visit  for a   check up in one months, please make an appointment

## 2015-03-24 NOTE — Progress Notes (Signed)
Subjective:    Patient ID: Cynthia [name], female    DOB: 10-15-1945, 69 y.o.   MRN: [id number]  DOS:  03/24/2015 Type of visit - description : Acute visit Interval history: History of headaches, having more frequent and intense HAs for the last month particularly during the last week. Has 3 intense episode that lasted several hours. Headache is located mostly at the face, jaw , temples B, ears and behind the eyes. Previously seen responded to Flonase Mucinex and Astelin, has been using them on and off with some help.    Review of Systems No fever or chills No weight loss Some sinus congestion but no discharge. No GERD. Continue with cough, was seen 12-2014 , treated with amoxicillin. No major problems with anxiety or depression, symptoms controlled Denies any dizziness, diplopia, sore speech or motor deficits  Past Medical History  Diagnosis Date  . Depression   . Constipation, chronic   . Headache(784.0)   . DEGENERATIVE JOINT DISEASE 06/02/2010  . PUD (peptic ulcer disease) 2012    Upper GI bleed d/t NSAIDs  . RLS (restless legs syndrome)     robaxin prn  . Glaucoma   . Allergy   . Anxiety   . Blood transfusion without reported diagnosis   . Hyperlipidemia     hx of  . Osteopenia   . Arthritis   . Psoriasis     Past Surgical History  Procedure Laterality Date  . Cesarean section  1969  . Ankle fracture surgery Left     surgery 12-11 (LEFT)    Social History   Social History  . Marital Status: Married    Spouse Name: N/A  . Number of Children: 1  . Years of Education: N/A   Occupational History  . 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 Smoking status: Never Smoker   . Smokeless tobacco: Never Used  . Alcohol Use: Yes     Comment: socially  . Drug Use: No  . Sexual Activity: Not on file   Other Topics Concern  . Not on file   Social History Narrative   2 step children, 1 child , [place]- patient and her husband,  he recently stop driving          Medication List       This list is accurate as of: 03/24/15 11:59 PM.  Always use your most recent med list.               azelastine 0.1 % nasal spray  Commonly known as:  ASTELIN  Place 2 sprays into both nostrils at bedtime.     B COMPLEX VITAMINS PO  Take by mouth daily.     buPROPion 300 MG 24 hr tablet  Commonly known as:  WELLBUTRIN XL  Take 1 tablet (300 mg total) by mouth daily.     calcipotriene 0.005 % cream  Commonly known as:  DOVONOX     calcium gluconate 500 MG tablet  Take 1,000 mg by mouth daily.     cetirizine 10 MG tablet  Commonly known as:  ZYRTEC  Take 10 mg by mouth daily.     citalopram 20 MG tablet  Commonly known as:  CELEXA  Take 1 tablet daily.     clobetasol 0.05 % external solution  Commonly known as:  TEMOVATE  Apply 1 application topically 2 (two) times daily.     COMBIGAN 0.2-0.5 %  ophthalmic solution  Generic drug:  brimonidine-timolol  Place 1 drop into the right eye every 12 (twelve) hours.     Fish Oil 1000 MG Cpdr  Take by mouth.     methocarbamol 500 MG tablet  Commonly known as:  ROBAXIN  take 1 tablet by mouth three times a day if needed for RLS     omeprazole 20 MG capsule  Commonly known as:  PRILOSEC  Take 1 capsule (20 mg total) by mouth daily.     triamcinolone cream 0.1 %  Commonly known as:  KENALOG     TURMERIC PO  Take 1 tablet by mouth daily.     VITAMIN D-400 PO  Take by mouth.           Objective:   Physical Exam BP 124/72 mmHg  Pulse 76  Temp(Src) 98.3 F (36.8 C) (Oral)  Ht 5\' 5"  (1.651 m)  Wt 163 lb 8 oz (74.163 kg)  BMI 27.21 kg/m2  SpO2 96% General:   Well developed, well nourished . NAD.  HEENT:  Normocephalic . Face symmetric, atraumatic. TMJ, no click but slightly TTP on the left. Nose not congested, maxillary and frontal sinuses no TTP. Throat symmetric. Neck is full range of motion. Lungs:  CTA B Normal respiratory effort, no  intercostal retractions, no accessory muscle use. Heart: RRR,  no murmur.  No pretibial edema bilaterally  Skin: Not pale. Not jaundice Neurologic:  alert & oriented X3.  Speech normal, gait appropriate for age and unassisted. EOMI, pupils equal and reactive, DTRs and motor symmetric Psych--  Cognition and judgment appear intact.  Cooperative with normal attention span and concentration.  Behavior appropriate. No anxious or depressed appearing.      Assessment & Plan:  Assessment> Hyperlipidemia Anxiety depression DJD Headaches Osteopenia PUD due to NSAIDs RLS Glaucoma Chronic constipation  PLAN: Headaches: Long  history of headaches, historically responds well to   allergy treatments such as Astelin. Neurological exam is normal. DDX-- sinusitis (allergic-infectious), migraine, TMJ, T.A., etc Plan: CBC, sedimentation rate, BMP. CT sinus. Consistent use of Flonase, Astelin, Mucinex. Further advice which results.

## 2015-03-24 NOTE — Progress Notes (Signed)
Pre visit review using our clinic review tool, if applicable. No additional management support is needed unless otherwise documented below in the visit note. 

## 2015-03-25 DIAGNOSIS — Z09 Encounter for follow-up examination after completed treatment for conditions other than malignant neoplasm: Secondary | ICD-10-CM | POA: Insufficient documentation

## 2015-03-25 NOTE — Assessment & Plan Note (Signed)
Headaches: Long  history of headaches, historically responds well to   allergy treatments such as Astelin. Neurological exam is normal. DDX-- sinusitis (allergic-infectious), migraine, TMJ, T.A., etc Plan: CBC, sedimentation rate, BMP. CT sinus. Consistent use of Flonase, Astelin, Mucinex. Further advice which results.

## 2015-03-29 ENCOUNTER — Telehealth: Payer: Self-pay | Admitting: Internal Medicine

## 2015-03-29 ENCOUNTER — Ambulatory Visit (HOSPITAL_BASED_OUTPATIENT_CLINIC_OR_DEPARTMENT_OTHER)
Admission: RE | Admit: 2015-03-29 | Discharge: 2015-03-29 | Disposition: A | Discharge status: Home / Self Care | Payer: Medicare PPO | Source: Ambulatory Visit | Attending: Internal Medicine | Admitting: Internal Medicine

## 2015-03-29 DIAGNOSIS — E785 Hyperlipidemia, unspecified: Secondary | ICD-10-CM | POA: Diagnosis not present

## 2015-03-29 DIAGNOSIS — J323 Chronic sphenoidal sinusitis: Secondary | ICD-10-CM | POA: Insufficient documentation

## 2015-03-29 DIAGNOSIS — H409 Unspecified glaucoma: Secondary | ICD-10-CM | POA: Diagnosis not present

## 2015-03-29 DIAGNOSIS — R51 Headache: Secondary | ICD-10-CM | POA: Diagnosis not present

## 2015-03-29 DIAGNOSIS — R519 Headache, unspecified: Secondary | ICD-10-CM

## 2015-03-29 MED ORDER — PREDNISONE 10 MG PO TABS: ORAL_TABLET | ORAL | Status: DC

## 2015-03-29 MED ORDER — AMOXICILLIN-POT CLAVULANATE 875-125 MG PO TABS: 1.0000 | ORAL_TABLET | Freq: Two times a day (BID) | ORAL | Status: DC

## 2015-03-29 NOTE — Telephone Encounter (Signed)
Spoke with Pt, informed her of CT results. Instructed her to take Prednisone and Augmentin as prescribed and to let us know if not getting better. 1 month F/U appt scheduled for 04/23/2015.

## 2015-03-29 NOTE — Telephone Encounter (Signed)
Advise patient, CT showed sinusitis. Plan: Prednisone ,  Augmentin. rx sent.  Continue with nasal sprays. If she is not improving in the next few days or if the symptoms come back: Let me know

## 2015-04-23 ENCOUNTER — Ambulatory Visit (INDEPENDENT_AMBULATORY_CARE_PROVIDER_SITE_OTHER): Payer: Medicare PPO | Admitting: Internal Medicine

## 2015-04-23 ENCOUNTER — Encounter: Payer: Self-pay | Admitting: Internal Medicine

## 2015-04-23 VITALS — BP 118/72 | HR 74 | Temp 98.3°F | Ht 65.0 in | Wt 166.4 lb

## 2015-04-23 DIAGNOSIS — G44029 Chronic cluster headache, not intractable: Secondary | ICD-10-CM

## 2015-04-23 DIAGNOSIS — N760 Acute vaginitis: Secondary | ICD-10-CM

## 2015-04-23 DIAGNOSIS — Z09 Encounter for follow-up examination after completed treatment for conditions other than malignant neoplasm: Secondary | ICD-10-CM

## 2015-04-23 MED ORDER — DOXYCYCLINE HYCLATE 100 MG PO TABS: 100.0000 mg | ORAL_TABLET | Freq: Two times a day (BID) | ORAL | Status: DC

## 2015-04-23 MED ORDER — FLUCONAZOLE 150 MG PO TABS: 150.0000 mg | ORAL_TABLET | Freq: Every day | ORAL | Status: DC

## 2015-04-23 MED ORDER — PREDNISONE 10 MG PO TABS: ORAL_TABLET | ORAL | Status: DC

## 2015-04-23 NOTE — Patient Instructions (Signed)
Take prednisone as prescribed  Doxycycline for 10 days  Use OTC Monistat for the the yeast  infection.  If in a couple of weeks you still have problems with a yeast infection, please use Diflucan 1 tablet daily for 2 days. Prescriptions has been already sent.  Call anytime if symptoms sevevere.  Next visit by February 2017, physical exam

## 2015-04-23 NOTE — Progress Notes (Signed)
Pre visit review using our clinic review tool, if applicable. No additional management support is needed unless otherwise documented below in the visit note. 

## 2015-04-23 NOTE — Progress Notes (Signed)
Subjective:    Patient ID: Cynthia [name], female    DOB: 1946-01-30, 69 y.o.   MRN: [id number]  DOS:  04/23/2015 Type of visit - description : Acute Interval history:  Was seen recently with a headache, CT show sinusitis, status post antibiotics and prednisone. She definitely improved but after she finished treatment symptoms started to come back: At this time the headache is located at the forehead and occipital area, L>>R. Last few hours, in the last 48 hours she hurt all day long. This afternoon she is headache free.  Review of Systems no fever chills. No tearing Moderate sinus congestion, no discharge, + postnasal dripping. Occasional nausea when the headache is intense, no vomiting. Mild cough with greenish sputum production. No visual disturbances or jaw claudication.  mild vaginal itching, patient suspects a yeast infx   Past Medical History  Diagnosis Date  . Depression   . Constipation, chronic   . Headache(784.0)   . DEGENERATIVE JOINT DISEASE 06/02/2010  . PUD (peptic ulcer disease) 2012    Upper GI bleed d/t NSAIDs  . RLS (restless legs syndrome)     robaxin prn  . Allergy   . Anxiety   . Blood transfusion without reported diagnosis   . Hyperlipidemia     hx of  . Osteopenia   . Arthritis   . Psoriasis   . Glaucoma 2014    Right eye    Past Surgical History  Procedure Laterality Date  . Cesarean section  1969  . Ankle fracture surgery Left     surgery 12-11 (LEFT)    Social History   Social History  . Marital Status: Married    Spouse Name: N/A  . Number of Children: 1  . Years of Education: N/A   Occupational History  . 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 Smoking status: Never Smoker   . Smokeless tobacco: Never Used  . Alcohol Use: Yes     Comment: socially  . Drug Use: No  . Sexual Activity: Not on file   Other Topics Concern  . Not on file   Social History Narrative   2 step children, 1 child , [place]- patient and her husband, he recently stop driving          Medication List       This list is accurate as of: 04/23/15 11:59 PM.  Always use your most recent med list.               azelastine 0.1 % nasal spray  Commonly known as:  ASTELIN  Place 2 sprays into both nostrils at bedtime.     B COMPLEX VITAMINS PO  Take by mouth daily.     buPROPion 300 MG 24 hr tablet  Commonly known as:  WELLBUTRIN XL  Take 1 tablet (300 mg total) by mouth daily.     calcipotriene 0.005 % cream  Commonly known as:  DOVONOX     calcium gluconate 500 MG tablet  Take 1,000 mg by mouth daily.     cetirizine 10 MG tablet  Commonly known as:  ZYRTEC  Take 10 mg by mouth daily.     citalopram 20 MG tablet  Commonly known as:  CELEXA  Take 1 tablet daily.     clobetasol 0.05 % external solution  Commonly known as:  TEMOVATE  Apply 1 application topically 2 (two) times daily.  COMBIGAN 0.2-0.5 % ophthalmic solution  Generic drug:  brimonidine-timolol  Place 1 drop into the right eye every 12 (twelve) hours.     doxycycline 100 MG tablet  Commonly known as:  VIBRA-TABS  Take 1 tablet (100 mg total) by mouth 2 (two) times daily.     Fish Oil 1000 MG Cpdr  Take by mouth.     fluconazole 150 MG tablet  Commonly known as:  DIFLUCAN  Take 1 tablet (150 mg total) by mouth daily.     methocarbamol 500 MG tablet  Commonly known as:  ROBAXIN  take 1 tablet by mouth three times a day if needed for RLS     omeprazole 20 MG capsule  Commonly known as:  PRILOSEC  Take 1 capsule (20 mg total) by mouth daily.     predniSONE 10 MG tablet  Commonly known as:  DELTASONE  4 tablets x 2 days, 3 tabs x 2 days, 2 tabs x 2 days, 1 tab x 2 days     triamcinolone cream 0.1 %  Commonly known as:  KENALOG     TURMERIC PO  Take 1 tablet by mouth daily.     VITAMIN D-400 PO  Take by mouth.           Objective:   Physical Exam BP 118/72 mmHg  Pulse 74  Temp(Src) 98.3 F  (36.8 C) (Oral)  Ht 5\' 5"  (1.651 m)  Wt 166 lb 6 oz (75.467 kg)  BMI 27.69 kg/m2  SpO2 97% General:   Well developed, well nourished . NAD.  HEENT:  Normocephalic . Face symmetric, atraumatic. TMs normal, nose is slightly congested, sinuses slightly TTP of the left maxillary area (?). TMJ no click. No TTP at the temples Lungs:  CTA B Normal respiratory effort, no intercostal retractions, no accessory muscle use. Heart: RRR,  no murmur.  No pretibial edema bilaterally  Skin: Not pale. Not jaundice Neurologic:  alert & oriented X3.  Speech normal, gait appropriate for age and unassisted. EOMI, pupils equal and reactive, DTRs symmetric Psych--  Cognition and judgment appear intact.  Cooperative with normal attention span and concentration.  Behavior appropriate. No anxious or depressed appearing.      Assessment & Plan:   Assessment> Hyperlipidemia Anxiety depression DJD Headaches Osteopenia PUD due to NSAIDs RLS Glaucoma Chronic constipation  PLAN: Headache: Since the last visit, the CT show a right-sided sinusitis, sedimentation rate was 62. She was felt to have sinusitis, was treated with Augmentin and prednisone. Symptoms completely resolved but now they have resurface. Now I'm not completely sure if HAs are d/t  sinusitis or something else is playing a role, she had similar symptoms 3 years ago, cluster headaches?. Plan: Second round of antibiotics and prednisone, refer to neurology. Vaginal yeast  infection: Use Monistat for a week, after she finished a second round of antibiotics, Diflucan 2

## 2015-04-25 NOTE — Assessment & Plan Note (Signed)
Headache: Since the last visit, the CT show a right-sided sinusitis, sedimentation rate was 62. She was felt to have sinusitis, was treated with Augmentin and prednisone. Symptoms completely resolved but now they have resurface. Now I'm not completely sure if HAs are d/t  sinusitis or something else is playing a role, she had similar symptoms 3 years ago, cluster headaches?. Plan: Second round of antibiotics and prednisone, refer to neurology. Vaginal yeast  infection: Use Monistat for a week, after she finished a second round of antibiotics, Diflucan 2

## 2015-06-01 ENCOUNTER — Encounter: Payer: Self-pay | Admitting: Neurology

## 2015-06-01 ENCOUNTER — Ambulatory Visit (INDEPENDENT_AMBULATORY_CARE_PROVIDER_SITE_OTHER): Payer: Medicare Other | Admitting: Neurology

## 2015-06-01 VITALS — BP 126/74 | HR 75 | Ht 66.0 in | Wt 166.0 lb

## 2015-06-01 DIAGNOSIS — R51 Headache: Secondary | ICD-10-CM

## 2015-06-01 DIAGNOSIS — R519 Headache, unspecified: Secondary | ICD-10-CM

## 2015-06-01 MED ORDER — NORTRIPTYLINE HCL 25 MG PO CAPS: 25.0000 mg | ORAL_CAPSULE | Freq: Every day | ORAL | Status: DC

## 2015-06-01 NOTE — Progress Notes (Signed)
NEUROLOGY CONSULTATION NOTE  KARYNNA HAWKINS MRN: [id number] DOB: 1945/08/05  Referring provider: Dr. Larose Kells Primary care provider: Dr. Larose Kells  Reason for consult:  headache  HISTORY OF PRESENT ILLNESS: Cynthia [name] is a 70 year old right-handed female with depression and degenerative joint disease who presents for headache.  History obtained by patient and PCP note.  Images of CT sinuses reviewed.  She started having headaches once in a while about 2 years ago, but over the past 6 months, they have progressively become more frequent.  They are usually left sided, involving the ear, but also may be right sided (involving the sinuses) or in band-like distribution.  It is both throbbing and non-throbbing.  It is usually 5/10 but severe episodes are 7-8/10.  Sometimes there is nausea.  There is phonophobia.  There is no photophobia or visual disturbance.  It lasts a couple of hours and recently has been occuring 15 days out of the month (3 to 4 days severe).  She reports sensation of congestion but no runny nose.  .  CT of sinuses showed opacified right sphenoid sinus and ethmoid air cell.  She was treated a couple of times with Augmentin and prednisone, which was effective for a while but headaches then returned.  She takes Tylenol about once or twice a week.  She rarely uses Advil.  She seems to get relief from Mucinex.  She notes some neck pain due to degenerative joint disease.  She has no prior history of headache.  Sed Rate was 62.  She has routine eye exams and is being treated for glaucoma.  Otherwise, she denies vision loss.  She denies fever or diffuse joint pain or myalgias.   PAST MEDICAL HISTORY: Past Medical History  Diagnosis Date  . Depression   . Constipation, chronic   . Headache(784.0)   . DEGENERATIVE JOINT DISEASE 06/02/2010  . PUD (peptic ulcer disease) 2012    Upper GI bleed d/t NSAIDs  . RLS (restless legs syndrome)     robaxin prn  . Allergy   . Anxiety   . Blood  transfusion without reported diagnosis   . Hyperlipidemia     hx of  . Osteopenia   . Arthritis   . Psoriasis   . Glaucoma 2014    Right eye    PAST SURGICAL HISTORY: Past Surgical History  Procedure Laterality Date  . Cesarean section  1969  . Ankle fracture surgery Left     surgery 12-11 (LEFT)    MEDICATIONS: Current Outpatient Prescriptions on File Prior to Visit  Medication Sig Dispense Refill  . azelastine (ASTELIN) 0.1 % nasal spray Place 2 sprays into both nostrils at bedtime. 30 mL 6  . B COMPLEX VITAMINS PO Take by mouth daily.    . brimonidine-timolol (COMBIGAN) 0.2-0.5 % ophthalmic solution Place 1 drop into the right eye every 12 (twelve) hours.     Marland Kitchen buPROPion (WELLBUTRIN XL) 300 MG 24 hr tablet Take 1 tablet (300 mg total) by mouth daily. 90 tablet 2  . calcipotriene (DOVONOX) 0.005 % cream     . calcium gluconate 500 MG tablet Take 1,000 mg by mouth daily.      . cetirizine (ZYRTEC) 10 MG tablet Take 10 mg by mouth daily.      . Cholecalciferol (VITAMIN D-400 PO) Take by mouth.      . clobetasol (TEMOVATE) 0.05 % external solution Apply 1 application topically 2 (two) times daily.    . fluconazole (DIFLUCAN) 150  MG tablet Take 1 tablet (150 mg total) by mouth daily. 2 tablet 0  . methocarbamol (ROBAXIN) 500 MG tablet take 1 tablet by mouth three times a day if needed for RLS 90 tablet 3  . Omega-3 Fatty Acids (FISH OIL) 1000 MG CPDR Take by mouth.      Marland Kitchen omeprazole (PRILOSEC) 20 MG capsule Take 1 capsule (20 mg total) by mouth daily. 30 capsule 3  . triamcinolone cream (KENALOG) 0.1 %     . TURMERIC PO Take 1 tablet by mouth daily.     No current facility-administered medications on file prior to visit.    ALLERGIES: Allergies  Allergen Reactions  . Motrin [Ibuprofen]     GI bleed  . Pollen Extract   . Statins     "delibitating pain all over body"  . Sulfonamide Derivatives     Pt unsure of reaction  . Ciprofloxacin Swelling and Rash    FAMILY  HISTORY: Family History  Problem Relation Age of Onset  . Aneurysm Mother     brain  . Hypertension Neg Hx   . Breast cancer Neg Hx   . Coronary artery disease Neg Hx   . Colon cancer Neg Hx   . Esophageal cancer Neg Hx   . Stomach cancer Neg Hx   . Rectal cancer Neg Hx   . Celiac disease Brother   . Diabetes Maternal Grandfather     SOCIAL HISTORY: Social History   Social History  . Marital Status: Married    Spouse Name: N/A  . Number of Children: 1  . Years of Education: N/A   Occupational History  . 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 Smoking status: Never Smoker   . Smokeless tobacco: Never Used  . Alcohol Use: Yes     Comment: socially  . Drug Use: No  . Sexual Activity: Not on file   Other Topics Concern  . Not on file   Social History Narrative   2 step children, 1 child , [place]- patient and her husband, he recently stop driving      REVIEW OF SYSTEMS: Constitutional: No fevers, chills, or sweats, no generalized fatigue, change in appetite Eyes: No visual changes, double vision, eye pain Ear, nose and throat: No hearing loss, ear pain, nasal congestion, sore throat Cardiovascular: No chest pain, palpitations Respiratory:  No shortness of breath at rest or with exertion, wheezes GastrointestinaI: No nausea, vomiting, diarrhea, abdominal pain, fecal incontinence Genitourinary:  No dysuria, urinary retention or frequency Musculoskeletal:  No neck pain, back pain Integumentary: No rash, pruritus, skin lesions Neurological: as above Psychiatric: No depression, insomnia, anxiety Endocrine: No palpitations, fatigue, diaphoresis, mood swings, change in appetite, change in weight, increased thirst Hematologic/Lymphatic:  No anemia, purpura, petechiae. Allergic/Immunologic: no itchy/runny eyes, nasal congestion, recent allergic reactions, rashes  PHYSICAL EXAM: Filed Vitals:   06/01/15 1451  BP: 126/74  Pulse: 75    General: No acute distress.  Patient appears well-groomed.  Head:  Normocephalic/atraumatic Eyes:  fundi unremarkable, without vessel changes, exudates, hemorrhages or papilledema. Neck: supple, no paraspinal tenderness, full range of motion Back: No paraspinal tenderness Heart: regular rate and rhythm Lungs: Clear to auscultation bilaterally. Vascular: No carotid bruits. Neurological Exam: Mental status: alert and oriented to person, place, and time, recent and remote memory intact, fund of knowledge intact, attention and concentration intact, speech fluent and not dysarthric, language intact. Cranial nerves: CN I: not tested  CN II: pupils equal, round and reactive to light, visual fields intact, fundi unremarkable, without vessel changes, exudates, hemorrhages or papilledema. CN III, IV, VI:  full range of motion, no nystagmus, no ptosis CN V: facial sensation intact CN VII: upper and lower face symmetric CN VIII: hearing intact CN IX, X: gag intact, uvula midline CN XI: sternocleidomastoid and trapezius muscles intact CN XII: tongue midline Bulk & Tone: normal, no fasciculations. Motor:  5/5 throughout  Sensation: temperature and vibration sensation intact. Deep Tendon Reflexes:  2+ throughout, toes downgoing.  Finger to nose testing:  Without dysmetria.  Heel to shin:  Without dysmetria.  Gait:  Normal station and stride.  Able to turn and tandem walk. Romberg negative.  IMPRESSION: Headache, but unclear syndrome type.  Possibly migraine given the symptom of nausea.  Sed Rate is elevated but not to the degree that I would expect temporal arteritis.    PLAN: 1.  Will taper off of celexa and instead start nortriptyline 25mg  at bedtime.  She will remain on Wellbutrin.  Potential side effects (including interactions with Wellbutrin) were discussed. 2.  We will check MRI of brain for new/increased headaches in woman over 50 3.  Follow up in 3 months but she will call in 4 weeks  with update.  Thank you for allowing me to take part in the care of this patient.  Metta Clines, DO  CC:  Kathlene November, MD

## 2015-06-01 NOTE — Patient Instructions (Signed)
1.  Start nortriptyline 25mg  at bedtime.  Call in 4 weeks with update and we can adjust dose if needed.  Wait a week until off of celexa to start it. 2.  We will taper off of Celexa.  Take 1 pill every other day for 1 week and then stop. 3.  Continue Wellbutrin 4.  We will get MRI of the brain without contrast

## 2015-06-07 ENCOUNTER — Other Ambulatory Visit: Payer: Medicare Other

## 2015-06-10 ENCOUNTER — Ambulatory Visit
Admission: RE | Admit: 2015-06-10 | Discharge: 2015-06-10 | Disposition: A | Discharge status: Home / Self Care | Payer: Medicare Other | Source: Ambulatory Visit | Attending: Neurology | Admitting: Neurology

## 2015-06-10 ENCOUNTER — Telehealth: Payer: Self-pay

## 2015-06-10 DIAGNOSIS — R51 Headache: Principal | ICD-10-CM

## 2015-06-10 DIAGNOSIS — R519 Headache, unspecified: Secondary | ICD-10-CM

## 2015-06-10 NOTE — Telephone Encounter (Signed)
Message relayed to patient. Verbalized understanding and denied questions.   

## 2015-06-10 NOTE — Telephone Encounter (Signed)
-----   Message from Pieter Partridge, DO sent at 06/10/2015  2:40 PM EST ----- MRI of brain looks okay

## 2015-06-17 ENCOUNTER — Telehealth: Payer: Self-pay

## 2015-06-17 NOTE — Telephone Encounter (Signed)
   Patient called to report new headaches. Nortriptyline had been helping her headaches until yesterday. Yesterday she started experiencing a vice like pressure on the top of her head. This is not the same type headache she has previously had. Please advise.   [id number]

## 2015-06-18 NOTE — Telephone Encounter (Signed)
Patient woke up w/o headache. She is keeping a headache diary. Will come to clinic if headache returns today. Will present to ER over weekend if severe or new symptoms return.

## 2015-06-18 NOTE — Telephone Encounter (Signed)
She has different types of headache.  She just had an MRI of the brain that was normal.  If it is very severe (worse headache of her life), or if she has other new symptoms (dizziness, blurred/double vision, slurred speech, gait instability), then she should go to the ED.  If not, she may come in for a headache cocktail (if she has a driver).  If she does not have a driver, then she can come in for a Toradol 60mg  injection.  If headaches persist and become severe or she exhibits new symptoms such as those listed above, then she needs to go to the ED.

## 2015-06-30 ENCOUNTER — Other Ambulatory Visit: Payer: Self-pay

## 2015-06-30 MED ORDER — BUPROPION HCL ER (XL) 300 MG PO TB24: 300.0000 mg | ORAL_TABLET | Freq: Every day | ORAL | Status: DC

## 2015-07-06 ENCOUNTER — Ambulatory Visit (INDEPENDENT_AMBULATORY_CARE_PROVIDER_SITE_OTHER): Payer: Medicare Other | Admitting: Internal Medicine

## 2015-07-06 ENCOUNTER — Encounter: Payer: Self-pay | Admitting: Internal Medicine

## 2015-07-06 VITALS — BP 118/74 | HR 80 | Temp 98.3°F | Ht 66.0 in | Wt 169.2 lb

## 2015-07-06 DIAGNOSIS — F418 Other specified anxiety disorders: Secondary | ICD-10-CM

## 2015-07-06 DIAGNOSIS — F419 Anxiety disorder, unspecified: Secondary | ICD-10-CM

## 2015-07-06 DIAGNOSIS — Z09 Encounter for follow-up examination after completed treatment for conditions other than malignant neoplasm: Secondary | ICD-10-CM

## 2015-07-06 DIAGNOSIS — F329 Major depressive disorder, single episode, unspecified: Secondary | ICD-10-CM

## 2015-07-06 DIAGNOSIS — E785 Hyperlipidemia, unspecified: Secondary | ICD-10-CM

## 2015-07-06 DIAGNOSIS — F32A Depression, unspecified: Secondary | ICD-10-CM

## 2015-07-06 DIAGNOSIS — Z0000 Encounter for general adult medical examination without abnormal findings: Secondary | ICD-10-CM

## 2015-07-06 NOTE — Progress Notes (Signed)
Subjective:    Patient ID: Cynthia [name], female    DOB: 04-06-1946, 70 y.o.   MRN: [id number]  DOS:  07/06/2015 Type of visit - description : CPX We also discussed the following issues: Headaches: went  to see neurology, note reviewed. Anxiety depression: Recently SSRIs were changed to nortriptyline, has help the headaches. She continued to be under a lot of stress, sx not completely well, denies suicidal ideas, she continue seeing a counselor regularly. Has a rash, itching at the upper back. Also darker skin lesion on the right armpit    Review of Systems  Constitutional: No fever. No chills. No unexplained wt changes. No unusual sweats  HEENT: No dental problems, no ear discharge, no facial swelling, no voice changes. No eye discharge, no eye  redness , no  intolerance to light   Respiratory: No wheezing , no  difficulty breathing. No cough , no mucus production  Cardiovascular: No CP, no leg swelling , no  Palpitations  GI: no nausea, no vomiting, has chronic constipation and occasional diarrhea, no new symptoms. No blood in the stools. No dysphagia, no odynophagia    Endocrine: No polyphagia, no polyuria , no polydipsia  GU: No dysuria, gross hematuria, difficulty urinating. No urinary urgency, no frequency.  Musculoskeletal: No joint swellings or unusual aches or pains  Skin: No change in the color of the skin, palor , no  Rash  Allergic, immunologic: No environmental allergies , no  food allergies  Neurological: No dizziness no  syncope.  No diplopia, no slurred, no slurred speech, no motor deficits, no facial  Numbness  Hematological: No enlarged lymph nodes, no easy bruising , no unusual bleedings  Psychiatry: No suicidal ideas, no hallucinations, no beavior problems, no confusion.     Past Medical History  Diagnosis Date  . Depression   . Constipation, chronic   . Headache(784.0)   . DEGENERATIVE JOINT DISEASE 06/02/2010  . PUD (peptic ulcer disease) 2012    Upper GI bleed d/t NSAIDs  . RLS (restless legs syndrome)     robaxin prn  . Allergy   . Anxiety   . Blood transfusion without reported diagnosis   . Hyperlipidemia     hx of  . Osteopenia   . Arthritis   . Psoriasis   . Glaucoma 2014    Right eye    Past Surgical History  Procedure Laterality Date  . Cesarean section  1969  . Ankle fracture surgery Left     surgery 12-11 (LEFT)    Social History   Social History  . Marital Status: Married    Spouse Name: N/A  . Number of Children: 1  . Years of Education: N/A   Occupational History  . 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 Smoking status: Never Smoker   . Smokeless tobacco: Never Used  . Alcohol Use: Yes     Comment: socially  . Drug Use: No  . Sexual Activity: Not on file   Other Topics Concern  . Not on file   Social History Narrative   2 step children, 1 child , 7 GK   Household- patient and her husband, he recently stop driving     Family History  Problem Relation Age of Onset  . Aneurysm Mother     brain  . Hypertension Neg Hx   . Breast cancer Neg Hx   . Coronary artery disease Neg Hx   . Colon cancer  Neg Hx   . Esophageal cancer Neg Hx   . Stomach cancer Neg Hx   . Rectal cancer Neg Hx   . Celiac disease Brother   . Diabetes Maternal Grandfather         Medication List       This list is accurate as of: 07/06/15 11:59 PM.  Always use your most recent med list.               azelastine 0.1 % nasal spray  Commonly known as:  ASTELIN  Place 2 sprays into both nostrils at bedtime.     B COMPLEX VITAMINS PO  Take by mouth daily.     buPROPion 300 MG 24 hr tablet  Commonly known as:  WELLBUTRIN XL  Take 1 tablet (300 mg total) by mouth daily.     calcipotriene 0.005 % cream  Commonly known as:  DOVONOX     calcium gluconate 500 MG tablet  Take 1,000 mg by mouth daily.     cetirizine 10 MG tablet  Commonly known as:  ZYRTEC  Take 10 mg by mouth daily.       clobetasol 0.05 % external solution  Commonly known as:  TEMOVATE  Apply 1 application topically 2 (two) times daily.     COMBIGAN 0.2-0.5 % ophthalmic solution  Generic drug:  brimonidine-timolol  Place 1 drop into the right eye every 12 (twelve) hours.     Fish Oil 1000 MG Cpdr  Take by mouth.     methocarbamol 500 MG tablet  Commonly known as:  ROBAXIN  take 1 tablet by mouth three times a day if needed for RLS     nortriptyline 25 MG capsule  Commonly known as:  PAMELOR  Take 1 capsule (25 mg total) by mouth at bedtime.     omeprazole 20 MG capsule  Commonly known as:  PRILOSEC  Take 1 capsule (20 mg total) by mouth daily.     triamcinolone cream 0.1 %  Commonly known as:  KENALOG     TURMERIC PO  Take 1 tablet by mouth daily.     VITAMIN D-400 PO  Take by mouth.           Objective:   Physical Exam BP 118/74 mmHg  Pulse 80  Temp(Src) 98.3 F (36.8 C) (Oral)  Ht 5\' 6"  (1.676 m)  Wt 169 lb 3.2 oz (76.749 kg)  BMI 27.32 kg/m2  SpO2 98% General:   Well developed, well nourished . NAD.  HEENT:  Normocephalic . Face symmetric, atraumatic. Neck: No thyromegaly Lungs:  CTA B Normal respiratory effort, no intercostal retractions, no accessory muscle use. Heart: RRR,  no murmur.  no pretibial edema bilaterally  Abdomen:  Not distended, soft, non-tender. No rebound or rigidity. No mass,organomegaly Skin: Has slightly darker skin tag at the right armpit. Upper back has 2 patches of slightly dark, dry and scaly skin. Neurologic:  alert & oriented X3.  Speech normal, gait appropriate for age and unassisted Psych--  Cognition and judgment appear intact.  Cooperative with normal attention span and concentration.  Behavior appropriate. No anxious or depressed appearing.    Assessment & Plan:   Assessment> Hyperlipidemia Anxiety depression DJD Headaches Osteopenia: 07-2014 T score of -1.7, Rx calcium and vitamin D PUD due to NSAIDs RLS -  robaxin Glaucoma Chronic constipation Dx LSC (lichen )  ~ [id number]   PLAN: Dyslipidemia: Currently on no medication, recheck labs Anxiety depression: Sx suboptimally control, recently SSRIs were  stopped and she is on nortriptyline and Wellbutrin. We discussed possibly increase medication, she will let me know if/when ready. Headaches: Saw neurology, MRI 05-2015 negative, SSRI discontinue, on nortriptyline, HAs slightly better. 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 Skin lesion, armpit, recommend to see dermatology RTC 6-8 months

## 2015-07-06 NOTE — Progress Notes (Signed)
Pre visit review using our clinic review tool, if applicable. No additional management support is needed unless otherwise documented below in the visit note. 

## 2015-07-06 NOTE — Assessment & Plan Note (Addendum)
Td   2011; shingles shot 2009; pneumonia shot--2011;prevnar-- 2016; Had a flu shot  Colonoscopy: 05/15/2002, cscope 8-15, 1 polyp, 5 years No further  PAPs, see previous entries   MMG 11-2013 (-), breast exam 2016 neg. Plans to get a MMG this year Diet-exercise discussed

## 2015-07-06 NOTE — Patient Instructions (Signed)
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 There are many simple things that you can do to make your home safe and to help prevent falls. WHAT CAN I DO ON THE OUTSIDE OF MY HOME?  Regularly repair the edges of walkways and driveways and fix any cracks.  Remove high doorway thresholds.  Trim any shrubbery on the main path into your home.  Use bright outdoor lighting.  Clear walkways of debris and clutter, including tools and rocks.  Regularly check that handrails are securely fastened and in good repair. Both sides of any steps should have handrails.  Install guardrails along the edges of any raised decks or porches.  Have leaves, snow, and ice cleared regularly.  Use sand or salt on walkways during winter months.  In the garage, clean up any spills right away, including grease or oil spills. WHAT CAN I DO IN THE BATHROOM?  Use night lights.  Install grab bars by the toilet and in the tub and shower. Do not use towel bars as grab bars.  Use non-skid mats or decals on the floor of the tub or shower.  If you need to sit down while you are in the shower, use a plastic, non-slip stool.Marland Kitchen  Keep the floor dry. Immediately clean up any water that spills on the floor.  Remove soap buildup in the tub or shower on a regular basis.  Attach bath mats securely with double-sided non-slip rug tape.  Remove throw rugs and other tripping hazards from the floor. WHAT CAN I DO IN THE BEDROOM?  Use night lights.  Make sure that a bedside light is easy to reach.  Do not use oversized bedding that drapes onto the floor.  Have a firm  chair that has side arms to use for getting dressed.  Remove throw rugs and other tripping hazards from the floor. WHAT CAN I DO IN THE KITCHEN?   Clean up any spills right away.  Avoid walking on wet floors.  Place frequently used items in easy-to-reach places.  If you need to reach for something above you, use a sturdy step stool that has a grab bar.  Keep electrical cables out of the way.  Do not use floor polish or wax that makes floors slippery. If you have to use wax, make sure that it is non-skid floor wax.  Remove throw rugs and other tripping hazards from the floor. WHAT CAN I DO IN THE STAIRWAYS?  Do not leave any items on the stairs.  Make sure that there are handrails on both sides of the stairs. Fix handrails that are broken or loose. Make sure that handrails are as long as the stairways.  Check any carpeting to make sure that it is firmly attached to the stairs. Fix any carpet that is loose or worn.  Avoid having throw rugs at the top or bottom of stairways, or secure the rugs with carpet tape to prevent them from moving.  Make sure that you have a light switch at the top of the stairs and the bottom of  the stairs. If you do not have them, have them installed. WHAT ARE SOME OTHER FALL PREVENTION TIPS?  Wear closed-toe shoes that fit well and support your feet. Wear shoes that have rubber soles or low heels.  When you use a stepladder, make sure that it is completely opened and that the sides are firmly locked. Have someone hold the ladder while you are using it. Do not climb a closed stepladder.  Add color or contrast paint or tape to grab bars and handrails in your home. Place contrasting color strips on the first and last steps.  Use mobility aids as needed, such as canes, walkers, scooters, and crutches.  Turn on lights if it is dark. Replace any light bulbs that burn out.  Set up furniture so that there are clear paths. Keep the furniture in the same  spot.  Fix any uneven floor surfaces.  Choose a carpet design that does not hide the edge of steps of a stairway.  Be aware of any and all pets.  Review your medicines with your healthcare provider. Some medicines can cause dizziness or changes in blood pressure, which increase your risk of falling. Talk with your health care provider about other ways that you can decrease your risk of falls. This may include working with a physical therapist or trainer to improve your strength, balance, and endurance.   This information is not intended to replace advice given to you by your health care provider. Make sure you discuss any questions you have with your health care provider.   Document Released: 04/21/2002 Document Revised: 09/15/2014 Document Reviewed: 06/05/2014 Elsevier Interactive Patient Education [employer].

## 2015-07-07 ENCOUNTER — Other Ambulatory Visit (INDEPENDENT_AMBULATORY_CARE_PROVIDER_SITE_OTHER): Payer: Medicare Other

## 2015-07-07 DIAGNOSIS — E785 Hyperlipidemia, unspecified: Secondary | ICD-10-CM | POA: Diagnosis not present

## 2015-07-07 LAB — LIPID PANEL
CHOL/HDL RATIO: 4
Cholesterol: 271 mg/dL — ABNORMAL HIGH (ref 0–200)
HDL: 75.7 mg/dL (ref >39.00)
LDL Cholesterol: 175 mg/dL — ABNORMAL HIGH (ref 0–99)
NonHDL: 194.88
TRIGLYCERIDES: 99 mg/dL (ref 0.0–149.0)
VLDL: 19.8 mg/dL (ref 0.0–40.0)

## 2015-07-07 NOTE — Assessment & Plan Note (Signed)
Dyslipidemia: Currently on no medication, recheck labs Anxiety depression: Sx suboptimally control, recently SSRIs were stopped and she is on nortriptyline and Wellbutrin. We discussed possibly increase medication, she will let me know if/when ready. Headaches: Saw neurology, MRI 05-2015 negative, SSRI discontinue, on nortriptyline, HAs slightly better. 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 Skin lesion, armpit, recommend to see dermatology RTC 6-8 months

## 2015-07-12 ENCOUNTER — Other Ambulatory Visit: Payer: Self-pay | Admitting: Neurology

## 2015-07-12 NOTE — Telephone Encounter (Signed)
Last OV: 06/01/15 Next OV: 09/24/15

## 2015-08-10 ENCOUNTER — Ambulatory Visit: Payer: Medicare Other | Admitting: Internal Medicine

## 2015-09-24 ENCOUNTER — Ambulatory Visit: Payer: Medicare Other | Admitting: Neurology

## 2015-12-02 ENCOUNTER — Other Ambulatory Visit: Payer: Self-pay | Admitting: Internal Medicine

## 2015-12-27 ENCOUNTER — Ambulatory Visit (INDEPENDENT_AMBULATORY_CARE_PROVIDER_SITE_OTHER): Payer: Medicare Other | Admitting: Internal Medicine

## 2015-12-27 ENCOUNTER — Encounter: Payer: Self-pay | Admitting: Internal Medicine

## 2015-12-27 VITALS — BP 118/64 | HR 65 | Temp 97.8°F | Resp 12 | Ht 66.0 in | Wt 167.1 lb

## 2015-12-27 DIAGNOSIS — R51 Headache: Secondary | ICD-10-CM | POA: Diagnosis not present

## 2015-12-27 DIAGNOSIS — R519 Headache, unspecified: Secondary | ICD-10-CM

## 2015-12-27 LAB — CBC WITH DIFFERENTIAL/PLATELET
BASOS PCT: 0.8 % (ref 0.0–3.0)
Basophils Absolute: 0.1 10*3/uL (ref 0.0–0.1)
EOS PCT: 9.5 % — AB (ref 0.0–5.0)
Eosinophils Absolute: 0.6 10*3/uL (ref 0.0–0.7)
HCT: 36.5 % (ref 36.0–46.0)
Hemoglobin: 12.4 g/dL (ref 12.0–15.0)
LYMPHS ABS: 1.5 10*3/uL (ref 0.7–4.0)
Lymphocytes Relative: 22.9 % (ref 12.0–46.0)
MCHC: 34.2 g/dL (ref 30.0–36.0)
MCV: 92 fl (ref 78.0–100.0)
MONO ABS: 0.7 10*3/uL (ref 0.1–1.0)
Monocytes Relative: 10.7 % (ref 3.0–12.0)
NEUTROS PCT: 56.1 % (ref 43.0–77.0)
Neutro Abs: 3.6 10*3/uL (ref 1.4–7.7)
PLATELETS: 332 10*3/uL (ref 150.0–400.0)
RBC: 3.96 Mil/uL (ref 3.87–5.11)
RDW: 13.1 % (ref 11.5–15.5)
WBC: 6.4 10*3/uL (ref 4.0–10.5)

## 2015-12-27 LAB — SEDIMENTATION RATE: SED RATE: 10 mm/h (ref 0–30)

## 2015-12-27 MED ORDER — PREDNISONE 10 MG PO TABS: ORAL_TABLET | ORAL | 0 refills | Status: DC

## 2015-12-27 MED ORDER — AMOXICILLIN 875 MG PO TABS: 875.0000 mg | ORAL_TABLET | Freq: Two times a day (BID) | ORAL | 0 refills | Status: DC

## 2015-12-27 NOTE — Progress Notes (Signed)
Subjective:    Patient ID: Cynthia [name], female    DOB: 09/14/1945, 70 y.o.   MRN: [id number]  DOS:  12/27/2015 Type of visit - description : Acute visit, headaches Interval history: HAs ---->  After she saw neurology and SSRI were switched to nortriptyline she felt temporarily better however over the last 3 months she is having daily headaches: She is wake up by the headache usually around 5 am in the morning, she takes Mucinex DM and Astelin, the headache gets better 2 or 3 hours after. It is consistently located at the forehead and maxillary areas bilaterally. She suspect it could be her sinuses because she does have some postnasal dripping. She does not take Motrin, very seldom takes Tylenol.    Review of Systems No fever chills No weight loss No sneezing or cough No generalized aches or pains  Past Medical History:  Diagnosis Date  . Allergy   . Anxiety   . Arthritis   . Blood transfusion without reported diagnosis   . Constipation, chronic   . DEGENERATIVE JOINT DISEASE 06/02/2010  . Depression   . Glaucoma 2014   Right eye  . Headache(784.0)   . Hyperlipidemia    hx of  . Osteopenia   . Psoriasis   . PUD (peptic ulcer disease) 2012   Upper GI bleed d/t NSAIDs  . RLS (restless legs syndrome)    robaxin prn    Past Surgical History:  Procedure Laterality Date  . ANKLE FRACTURE SURGERY Left    surgery 12-11 (LEFT)  . [place]    Social History   Social History  . Marital status: Married    Spouse name: N/A  . Number of children: 1  . Years of education: N/A   Occupational History  . 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 Smoking status: Never Smoker  . Smokeless tobacco: Never Used  . Alcohol use Yes     Comment: socially  . Drug use: No  . Sexual activity: Not on file   Other Topics Concern  . Not on file   Social History Narrative   2 step children, 1 child , [place]- patient and her  husband, he recently stop driving          Medication List       Accurate as of 12/27/15  5:44 PM. Always use your most recent med list.          amoxicillin 875 MG tablet Commonly known as:  AMOXIL Take 1 tablet (875 mg total) by mouth 2 (two) times daily.   azelastine 0.1 % nasal spray Commonly known as:  ASTELIN Place 2 sprays into both nostrils at bedtime.   B COMPLEX VITAMINS PO Take by mouth daily.   buPROPion 300 MG 24 hr tablet Commonly known as:  WELLBUTRIN XL Take 1 tablet (300 mg total) by mouth daily.   calcipotriene 0.005 % cream Commonly known as:  DOVONOX   calcium gluconate 500 MG tablet Take 1,000 mg by mouth daily.   cetirizine 10 MG tablet Commonly known as:  ZYRTEC Take 10 mg by mouth daily.   clobetasol 0.05 % external solution Commonly known as:  TEMOVATE Apply 1 application topically 2 (two) times daily.   COMBIGAN 0.2-0.5 % ophthalmic solution Generic drug:  brimonidine-timolol Place 1 drop into the right eye every 12 (twelve) hours.   Fish Oil 1000 MG Cpdr Take by  mouth.   MAGNESIUM PO Take 1 tablet by mouth daily.   methocarbamol 500 MG tablet Commonly known as:  ROBAXIN take 1 tablet by mouth three times a day if needed for RLS   nortriptyline 25 MG capsule Commonly known as:  PAMELOR TAKE ONE CAPSULE (25 MG) BY MOUTH AT BEDTIME.   omeprazole 20 MG capsule Commonly known as:  PRILOSEC Take 1 capsule (20 mg total) by mouth daily.   predniSONE 10 MG tablet Commonly known as:  DELTASONE 4 tablets x 2 days, 3 tabs x 2 days, 2 tabs x 2 days, 1 tab x 2 days   triamcinolone cream 0.1 % Commonly known as:  KENALOG   VITAMIN D-400 PO Take by mouth.          Objective:   Physical Exam BP 118/64 (BP Location: Right Arm, Patient Position: Sitting, Cuff Size: Small)   Pulse 65   Temp 97.8 F (36.6 C) (Oral)   Resp 12   Ht 5\' 6"  (1.676 m)   Wt 167 lb 2 oz (75.8 kg)   SpO2 98%   BMI 26.97 kg/m  General:   Well  developed, well nourished . NAD.  HEENT:  Normocephalic . Face symmetric, atraumatic. Nose slightly congested, sinuses: Slightly TTP, left maxillary area. Lungs:  CTA B Normal respiratory effort, no intercostal retractions, no accessory muscle use. Heart: RRR,  no murmur.  No pretibial edema bilaterally  Skin: Not pale. Not jaundice Neurologic:  alert & oriented X3.  Speech normal, gait appropriate for age and unassisted. EOMI. DTRs symmetric Psych--  Cognition and judgment appear intact.  Cooperative with normal attention span and concentration.  Behavior appropriate. No anxious or depressed appearing.      Assessment & Plan:    Assessment> Hyperlipidemia Anxiety depression DJD Headaches Osteopenia: 07-2014 T score of -1.7, Rx calcium and vitamin D PUD due to NSAIDs RLS - robaxin Glaucoma Chronic constipation Dx LSC (lichen )  ~ [id number]   PLAN: Headaches: After he she was switch from SSRIs to nortriptyline several months ago, she temporarily got better, headache become daily again ~2 months ago. She has some sinus sx, does not take excessive OTCs so I don't suspect rebound headaches. She is a slightly tender at the left maxillary sinuses. She previously responded very well to ABX/prednisone. Pt suspect sx could be related to sinusitis, I agree: Will check a sedimentation rate, CBC, trial with amoxicillin and prednisone RTC one month, if not better considering ENT or neuro re-eval.

## 2015-12-27 NOTE — Patient Instructions (Signed)
GO TO THE LAB : Get the blood work     GO TO THE FRONT DESK Schedule your next appointment for a  checkup in one month  Taken antibiotics and prednisone as prescribed Continue Flonase, Astelin.

## 2015-12-27 NOTE — Assessment & Plan Note (Signed)
Headaches: After he she was switch from SSRIs to nortriptyline several months ago, she temporarily got better, headache become daily again ~2 months ago. She has some sinus sx, does not take excessive OTCs so I don't suspect rebound headaches. She is a slightly tender at the left maxillary sinuses. She previously responded very well to ABX/prednisone. Pt suspect sx could be related to sinusitis, I agree: Will check a sedimentation rate, CBC, trial with amoxicillin and prednisone RTC one month, if not better considering ENT or neuro re-eval.

## 2015-12-27 NOTE — Progress Notes (Signed)
Pre visit review using our clinic review tool, if applicable. No additional management support is needed unless otherwise documented below in the visit note. 

## 2016-01-16 ENCOUNTER — Other Ambulatory Visit: Payer: Self-pay | Admitting: Internal Medicine

## 2016-01-16 ENCOUNTER — Other Ambulatory Visit: Payer: Self-pay | Admitting: Neurology

## 2016-01-18 ENCOUNTER — Telehealth: Payer: Self-pay | Admitting: Internal Medicine

## 2016-01-18 DIAGNOSIS — R519 Headache, unspecified: Secondary | ICD-10-CM

## 2016-01-18 DIAGNOSIS — R51 Headache: Principal | ICD-10-CM

## 2016-01-18 NOTE — Telephone Encounter (Signed)
Spoke w/ Pt, informed her that PCP recommends seeing Neuro for headaches. Pt agreed and informed she should receive a call to schedule appt. Pt verbalized understanding.

## 2016-01-18 NOTE — Telephone Encounter (Signed)
Okay, please arrange a neurology referral DX headache

## 2016-01-18 NOTE — Telephone Encounter (Signed)
Please advise 

## 2016-01-18 NOTE — Telephone Encounter (Signed)
Pt called back in to follow up with provider. Pt says that she is still having headaches. Pt says that she has been seen several times for this concern. Pt would like to know if provider could advise her further.      CB: [phone]

## 2016-01-25 ENCOUNTER — Ambulatory Visit: Payer: Medicare Other | Admitting: Internal Medicine

## 2016-02-01 ENCOUNTER — Encounter: Payer: Self-pay | Admitting: Neurology

## 2016-02-01 ENCOUNTER — Ambulatory Visit (INDEPENDENT_AMBULATORY_CARE_PROVIDER_SITE_OTHER): Payer: Medicare Other | Admitting: Neurology

## 2016-02-01 VITALS — BP 110/78 | HR 97 | Ht 66.0 in | Wt 167.1 lb

## 2016-02-01 DIAGNOSIS — G43709 Chronic migraine without aura, not intractable, without status migrainosus: Secondary | ICD-10-CM | POA: Diagnosis not present

## 2016-02-01 MED ORDER — SUMATRIPTAN SUCCINATE 100 MG PO TABS: ORAL_TABLET | ORAL | 2 refills | Status: DC

## 2016-02-01 MED ORDER — NORTRIPTYLINE HCL 50 MG PO CAPS: 50.0000 mg | ORAL_CAPSULE | Freq: Every day | ORAL | 2 refills | Status: DC

## 2016-02-01 NOTE — Progress Notes (Signed)
NEUROLOGY FOLLOW UP OFFICE NOTE  [name] [id number]  HISTORY OF PRESENT ILLNESS: Cynthia [name] is a 70 year old right-handed female with depression and degenerative joint disease who follows up for headache.  UPDATE: MRI of brain from 06/10/15 was normal. She was started on nortriptyline 25mg  at bedtime in January, which helped but headaches have since become daily.  She notes sensation of drainage.  She treats the headaches with Mucinex and Astelin spray.  She was treated recently with prednisone and Augmentin, which was ineffective.  She is convinced that the problem is with her sinuses.   Intensity:  7-8/10 Duration:  3 to 5 hours Frequency:  15 headache days in August, 10 headache days this month so far Current NSAIDS:  No (contraindicated due to GI bleed) Current analgesics:  Tylenol (ineffective) Current triptans:  no Current anti-emetic:  no Current Antihypertensive medications:  no Current Antidepressant medications:  nortriptyline 25mg , bupropion Current Anticonvulsant medications:  no Current Vitamins/Herbal/Supplements:  B complex, Mg Current Antihistamines/Decongestants:  Mucinex, Astelin Other therapy:  no  12/27/15 Labs include normal sed rate of 10 and unremarkable CBC.  HISTORY: She started having headaches once in a while around 2015, but over the past year, they have progressively become more frequent.  They are usually left sided, involving the ear, but also may be right sided (involving the sinuses) or in band-like distribution.  It is both throbbing and non-throbbing.  It is usually 5/10 but severe episodes are 7-8/10.  Sometimes there is nausea.  There is phonophobia.  There is no photophobia or visual disturbance.  It lasts a couple of hours and recently has been occuring 15 days out of the month (3 to 4 days severe).  She reports sensation of congestion but no runny nose.  CT of sinuses showed opacified right sphenoid sinus and ethmoid air cell.  She was  treated a couple of times with Augmentin and prednisone, which was effective for a while but headaches then returned.  She takes Tylenol about once or twice a week.  She rarely uses Advil.  She seems to get relief from Mucinex.  She notes some neck pain due to degenerative joint disease.  She has no prior history of headache.  Sed Rate was 62.  She has routine eye exams and is being treated for glaucoma.  Otherwise, she denies vision loss.  She denies fever or diffuse joint pain or myalgias.  PAST MEDICAL HISTORY: Past Medical History:  Diagnosis Date  . Allergy   . Anxiety   . Arthritis   . Blood transfusion without reported diagnosis   . Constipation, chronic   . DEGENERATIVE JOINT DISEASE 06/02/2010  . Depression   . Glaucoma 2014   Right eye  . Headache(784.0)   . Hyperlipidemia    hx of  . Osteopenia   . Psoriasis   . PUD (peptic ulcer disease) 2012   Upper GI bleed d/t NSAIDs  . RLS (restless legs syndrome)    robaxin prn    MEDICATIONS: Current Outpatient Prescriptions on File Prior to Visit  Medication Sig Dispense Refill  . azelastine (ASTELIN) 0.1 % nasal spray Place 2 sprays into both nostrils at bedtime. 30 mL 6  . B COMPLEX VITAMINS PO Take by mouth daily.    . brimonidine-timolol (COMBIGAN) 0.2-0.5 % ophthalmic solution Place 1 drop into the right eye every 12 (twelve) hours.     Marland Kitchen buPROPion (WELLBUTRIN XL) 300 MG 24 hr tablet Take 1 tablet (300 mg total)  by mouth daily. 90 tablet 1  . calcipotriene (DOVONOX) 0.005 % cream     . calcium gluconate 500 MG tablet Take 1,000 mg by mouth daily.      . cetirizine (ZYRTEC) 10 MG tablet Take 10 mg by mouth daily.      . Cholecalciferol (VITAMIN D-400 PO) Take by mouth.      . clobetasol (TEMOVATE) 0.05 % external solution Apply 1 application topically 2 (two) times daily.    Marland Kitchen MAGNESIUM PO Take 1 tablet by mouth daily.    . methocarbamol (ROBAXIN) 500 MG tablet take 1 tablet by mouth three times a day if needed for RLS 90  tablet 3  . Omega-3 Fatty Acids (FISH OIL) 1000 MG CPDR Take by mouth.      Marland Kitchen omeprazole (PRILOSEC) 20 MG capsule Take 1 capsule (20 mg total) by mouth daily. 30 capsule 2  . triamcinolone cream (KENALOG) 0.1 %      No current facility-administered medications on file prior to visit.     ALLERGIES: Allergies  Allergen Reactions  . Motrin [Ibuprofen]     GI bleed  . Pollen Extract   . Statins     "delibitating pain all over body"  . Sulfonamide Derivatives     Pt unsure of reaction  . Ciprofloxacin Swelling and Rash    FAMILY HISTORY: Family History  Problem Relation Age of Onset  . Aneurysm Mother     brain  . Celiac disease Brother   . Diabetes Maternal Grandfather   . Hypertension Neg Hx   . Breast cancer Neg Hx   . Coronary artery disease Neg Hx   . Colon cancer Neg Hx   . Esophageal cancer Neg Hx   . Stomach cancer Neg Hx   . Rectal cancer Neg Hx     SOCIAL HISTORY: Social History   Social History  . Marital status: Married    Spouse name: N/A  . Number of children: 1  . Years of education: N/A   Occupational History  . 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 Smoking status: Never Smoker  . Smokeless tobacco: Never Used  . Alcohol use Yes     Comment: socially  . Drug use: No  . Sexual activity: Not on file   Other Topics Concern  . Not on file   Social History Narrative   2 step children, 1 child , [place]- patient and her husband, he recently stop driving      REVIEW OF SYSTEMS: Constitutional: No fevers, chills, or sweats, no generalized fatigue, change in appetite Eyes: No visual changes, double vision, eye pain Ear, nose and throat: sensation of congestion Cardiovascular: No chest pain, palpitations Respiratory:  No shortness of breath at rest or with exertion, wheezes GastrointestinaI: No nausea, vomiting, diarrhea, abdominal pain, fecal incontinence Genitourinary:  No dysuria, urinary retention or  frequency Musculoskeletal:  No neck pain, back pain Integumentary: No rash, pruritus, skin lesions Neurological: as above Psychiatric: No depression, insomnia, anxiety Endocrine: No palpitations, fatigue, diaphoresis, mood swings, change in appetite, change in weight, increased thirst Hematologic/Lymphatic:  No purpura, petechiae. Allergic/Immunologic: no itchy/runny eyes, nasal congestion, recent allergic reactions, rashes  PHYSICAL EXAM: Vitals:   02/01/16 1142  BP: 110/78  Pulse: 97   General: No acute distress.  Patient appears well-groomed.  normal body habitus. Head:  Normocephalic/atraumatic Eyes:  Fundi examined but not visualized Neck: supple, no paraspinal tenderness, full range  of motion Heart:  Regular rate and rhythm Lungs:  Clear to auscultation bilaterally Back: No paraspinal tenderness Neurological Exam: alert and oriented to person, place, and time. Attention span and concentration intact, recent and remote memory intact, fund of knowledge intact.  Speech fluent and not dysarthric, language intact.  CN II-XII intact. Bulk and tone normal, muscle strength 5/5 throughout.  Sensation to light touch, temperature and vibration intact.  Deep tendon reflexes 2+ throughout, toes downgoing.  Finger to nose and heel to shin testing intact.  Gait normal, Romberg negative.  IMPRESSION: Chronic migraine.  CT of sinuses and MRI of brain over the past year has not demonstrated sinusitis.  PLAN: 1.  Increase nortriptyline to 50mg  at bedtime.   2.  Will try sumatriptan 100mg  for abortive therapy 3.  Discussed lifestyle modification including diet, hydration, exercise, supplements.  Advised to limit use of decongestants and antihistamines due to rebound effect. 4.  Contact me in 4 weeks with update.  Follow up in 3 months.  25 minutes spent face to face with patient, over 50% spent counseling.   Metta Clines, DO  CC:  Kathlene November, MD

## 2016-02-01 NOTE — Patient Instructions (Signed)
Migraine Recommendations: 1.  Increase nortriptyline to 50mg  at bedtime.  Call in 4 weeks with update and we can adjust dose if needed. 2.  Take sumatriptan 1 tablet at earliest onset of headache.  May repeat dose once in 2 hours if needed.  Do not exceed two tablets in 24 hours. 3.  Limit use of pain relievers to no more than 2 days out of the week.  These medications include acetaminophen, ibuprofen, triptans and narcotics.  This will help reduce risk of rebound headaches.  Also, try to limit use of decongestants and antihistamines. 4.  Be aware of common food triggers such as processed sweets, processed foods with nitrites (such as deli meat, hot dogs, sausages), foods with MSG, alcohol (such as wine), chocolate, certain cheeses, certain fruits (dried fruits, some citrus fruit), vinegar, diet soda. 4.  Avoid caffeine 5.  Routine exercise 6.  Proper sleep hygiene 7.  Stay adequately hydrated with water 8.  Keep a headache diary. 9.  Maintain proper stress management. 10.  Do not skip meals. 11.  Consider supplements:  Magnesium oxide 400mg  to 600mg  daily, riboflavin 400mg , Coenzyme Q 10 100mg  three times daily 12.  Follow up in 3 months.

## 2016-02-14 ENCOUNTER — Ambulatory Visit (INDEPENDENT_AMBULATORY_CARE_PROVIDER_SITE_OTHER): Payer: Medicare Other

## 2016-02-14 DIAGNOSIS — Z23 Encounter for immunization: Secondary | ICD-10-CM | POA: Diagnosis not present

## 2016-02-15 ENCOUNTER — Encounter: Payer: Self-pay | Admitting: Neurology

## 2016-05-05 ENCOUNTER — Ambulatory Visit: Payer: Medicare Other | Admitting: Neurology

## 2016-05-13 ENCOUNTER — Other Ambulatory Visit: Payer: Self-pay | Admitting: Neurology

## 2016-05-25 ENCOUNTER — Ambulatory Visit (INDEPENDENT_AMBULATORY_CARE_PROVIDER_SITE_OTHER): Payer: Medicare Other | Admitting: Neurology

## 2016-05-25 ENCOUNTER — Encounter: Payer: Self-pay | Admitting: Neurology

## 2016-05-25 VITALS — BP 110/70 | HR 81 | Ht 66.0 in | Wt 159.0 lb

## 2016-05-25 DIAGNOSIS — G43009 Migraine without aura, not intractable, without status migrainosus: Secondary | ICD-10-CM

## 2016-05-25 NOTE — Patient Instructions (Addendum)
Migraine Recommendations: 1.  Continue nortriptyline 50mg  at bedtime 2.  Take sumatriptan at earliest onset of headache.  May repeat dose once after 2 hours if needed.  Do not exceed two tablets in 24 hours. 3.  Limit use of pain relievers to no more than 2 days out of the week.  These medications include acetaminophen, ibuprofen, triptans and narcotics.  This will help reduce risk of rebound headaches. 4.  Be aware of common food triggers such as processed sweets, processed foods with nitrites (such as deli meat, hot dogs, sausages), foods with MSG, alcohol (such as wine), chocolate, certain cheeses, certain fruits (dried fruits, some citrus fruit), vinegar, diet soda. 4.  Avoid caffeine 5.  Routine exercise 6.  Proper sleep hygiene 7.  Stay adequately hydrated with water 8.  Keep a headache diary. 9.  Maintain proper stress management. 10.  Do not skip meals. 11.  Consider supplements:  Magnesium citrate 400mg  to 600mg  daily, riboflavin 400mg , Coenzyme Q 10 100mg  three times daily 12.  Follow up in 4 months   Migraine Headache A migraine headache is an intense, throbbing pain on one side or both sides of the head. Migraines may also cause other symptoms, such as nausea, vomiting, and sensitivity to light and noise. What are the causes? Doing or taking certain things may also trigger migraines, such as:  Alcohol.  Smoking.  Medicines, such as:  Medicine used to treat chest pain (nitroglycerine).  Birth control pills.  Estrogen pills.  Certain blood pressure medicines.  Aged cheeses, chocolate, or caffeine.  Foods or drinks that contain nitrates, glutamate, aspartame, or tyramine.  Physical activity. Other things that may trigger a migraine include:  Menstruation.  Pregnancy.  Hunger.  Stress, lack of sleep, too much sleep, or fatigue.  Weather changes. What increases the risk? The following factors may make you more likely to experience migraine headaches:  Age.  Risk increases with age.  Family history of migraine headaches.  Being Caucasian.  Depression and anxiety.  Obesity.  Being a woman.  Having a hole in the heart (patent foramen ovale) or other heart problems. What are the signs or symptoms? The main symptom of this condition is pulsating or throbbing pain. Pain may:  Happen in any area of the head, such as on one side or both sides.  Interfere with daily activities.  Get worse with physical activity.  Get worse with exposure to bright lights or loud noises. Other symptoms may include:  Nausea.  Vomiting.  Dizziness.  General sensitivity to bright lights, loud noises, or smells. Before you get a migraine, you may get warning signs that a migraine is developing (aura). An aura may include:  Seeing flashing lights or having blind spots.  Seeing bright spots, halos, or zigzag lines.  Having tunnel vision or blurred vision.  Having numbness or a tingling feeling.  Having trouble talking.  Having muscle weakness. How is this diagnosed? A migraine headache can be diagnosed based on:  Your symptoms.  A physical exam.  Tests, such as CT scan or MRI of the head. These imaging tests can help rule out other causes of headaches.  Taking fluid from the spine (lumbar puncture) and analyzing it (cerebrospinal fluid analysis, or CSF analysis). How is this treated? A migraine headache is usually treated with medicines that:  Relieve pain.  Relieve nausea.  Prevent migraines from coming back. Treatment may also include:  Acupuncture.  Lifestyle changes like avoiding foods that trigger migraines. Follow these instructions at  home: Medicines  Take over-the-counter and prescription medicines only as told by your health care provider.  Do not drive or use heavy machinery while taking prescription pain medicine.  To prevent or treat constipation while you are taking prescription pain medicine, your health care  provider may recommend that you:  Drink enough fluid to keep your urine clear or pale yellow.  Take over-the-counter or prescription medicines.  Eat foods that are high in fiber, such as fresh fruits and vegetables, whole grains, and beans.  Limit foods that are high in fat and processed sugars, such as fried and sweet foods. Lifestyle  Avoid alcohol use.  Do not use any products that contain nicotine or tobacco, such as cigarettes and e-cigarettes. If you need help quitting, ask your health care provider.  Get at least 8 hours of sleep every night.  Limit your stress. General instructions  Keep a journal to find out what may trigger your migraine headaches. For example, write down:  What you eat and drink.  How much sleep you get.  Any change to your diet or medicines.  If you have a migraine:  Avoid things that make your symptoms worse, such as bright lights.  It may help to lie down in a dark, quiet room.  Do not drive or use heavy machinery.  Ask your health care provider what activities are safe for you while you are experiencing symptoms.  Keep all follow-up visits as told by your health care provider. This is important. Contact a health care provider if:  You develop symptoms that are different or more severe than your usual migraine symptoms. Get help right away if:  Your migraine becomes severe.  You have a fever.  You have a stiff neck.  You have vision loss.  Your muscles feel weak or like you cannot control them.  You start to lose your balance often.  You develop trouble walking.  You faint. This information is not intended to replace advice given to you by your health care provider. Make sure you discuss any questions you have with your health care provider. Document Released: 05/01/2005 Document Revised: 11/19/2015 Document Reviewed: 10/18/2015 Elsevier Interactive Patient Education  2017 [employer].

## 2016-05-25 NOTE — Progress Notes (Signed)
NEUROLOGY FOLLOW UP OFFICE NOTE  LATRESHIA LAGATTUTA [id number]  HISTORY OF PRESENT ILLNESS: Cynthia [name] is a 71 year old right-handed female with depression, glaucoma and degenerative joint disease who follows up for chronic migraine   UPDATE: She eliminated some vegetables, such as eggplant and peppers and her migraines resolved after October.  However, they returned after the new year.  Over the past 1.5 weeks, she has had 4 headaches.  She takes sumatriptan or Tylenol PM and goes to sleep.   Intensity:  7-8/10 Duration:  3 to 5 hours Frequency:  4 days over past 10 days Triggers:  uncertain.  She can't think of a trigger.  She does report that her cleaning lady started using a new air freshener Relieving factors: sleep Current NSAIDS:  No (contraindicated due to GI bleed) Current analgesics:  no Current triptans:  sumatriptan 100mg  Current anti-emetic:  no Current Antihypertensive medications:  no Current Antidepressant medications:  nortriptyline 50mg , bupropion Current Anticonvulsant medications:  no Current Vitamins/Herbal/Supplements:  B complex, Mg Current Antihistamines/Decongestants:  Mucinex, Astelin Other therapy:  no  Diet:  needs to increase water intake.  Drinks soda, artificial sweetener Alcohol: no Smoking:  no Exercise:  no Depression/anxiety:  unchanged.    HISTORY: She started having headaches once in a while around 2015, but have progressively become more frequent in 2016.  They are usually left sided, involving the ear, but also may be right sided (involving the sinuses) or in band-like distribution.  It is both throbbing and non-throbbing.  Initially, it is usually 5/10 but severe episodes are 7-8/10.  Sometimes there is nausea.  There is phonophobia.  There is no photophobia or visual disturbance.  Initially, it lasts a couple of hours and occuring 15 days out of the month (3 to 4 days severe).  She reports sensation of congestion but no runny nose.  CT of  sinuses showed opacified right sphenoid sinus and ethmoid air cell.  She was treated a couple of times with Augmentin and prednisone, which was effective for a while but headaches then returned.  MRI of brain from 06/10/15 was normal.  Sed Rate from 12/27/15 was 10.    Past medication:  Tylenol  PAST MEDICAL HISTORY: Past Medical History:  Diagnosis Date  . Allergy   . Anxiety   . Arthritis   . Blood transfusion without reported diagnosis   . Constipation, chronic   . DEGENERATIVE JOINT DISEASE 06/02/2010  . Depression   . Glaucoma 2014   Right eye  . Headache(784.0)   . Hyperlipidemia    hx of  . Osteopenia   . Psoriasis   . PUD (peptic ulcer disease) 2012   Upper GI bleed d/t NSAIDs  . RLS (restless legs syndrome)    robaxin prn    MEDICATIONS: Current Outpatient Prescriptions on File Prior to Visit  Medication Sig Dispense Refill  . azelastine (ASTELIN) 0.1 % nasal spray Place 2 sprays into both nostrils at bedtime. 30 mL 6  . B COMPLEX VITAMINS PO Take by mouth daily.    . brimonidine-timolol (COMBIGAN) 0.2-0.5 % ophthalmic solution Place 1 drop into the right eye every 12 (twelve) hours.     Marland Kitchen buPROPion (WELLBUTRIN XL) 300 MG 24 hr tablet Take 1 tablet (300 mg total) by mouth daily. 90 tablet 1  . calcipotriene (DOVONOX) 0.005 % cream     . cetirizine (ZYRTEC) 10 MG tablet Take 10 mg by mouth daily.      . Cholecalciferol (VITAMIN D-400  PO) Take by mouth.      . clobetasol (TEMOVATE) 0.05 % external solution Apply 1 application topically 2 (two) times daily.    Marland Kitchen MAGNESIUM PO Take 1 tablet by mouth daily.    . methocarbamol (ROBAXIN) 500 MG tablet take 1 tablet by mouth three times a day if needed for RLS 90 tablet 3  . nortriptyline (PAMELOR) 50 MG capsule TAKE ONE CAPSULE BY MOUTH EVERY NIGHT AT BEDTIME 30 capsule 0  . Omega-3 Fatty Acids (FISH OIL) 1000 MG CPDR Take by mouth.      Marland Kitchen omeprazole (PRILOSEC) 20 MG capsule Take 1 capsule (20 mg total) by mouth daily.  (Patient taking differently: Take 20 mg by mouth every other day. ) 30 capsule 2  . SUMAtriptan (IMITREX) 100 MG tablet Take 1 tab at earliest onset of headache.  May repeat once in 2 hours if headache persists or recurs. 10 tablet 2  . triamcinolone cream (KENALOG) 0.1 %     . calcium gluconate 500 MG tablet Take 1,000 mg by mouth daily.       No current facility-administered medications on file prior to visit.     ALLERGIES: Allergies  Allergen Reactions  . Motrin [Ibuprofen]     GI bleed  . Pollen Extract   . Statins     "delibitating pain all over body"  . Sulfonamide Derivatives     Pt unsure of reaction  . Ciprofloxacin Swelling and Rash    FAMILY HISTORY: Family History  Problem Relation Age of Onset  . Aneurysm Mother     brain  . Celiac disease Brother   . Diabetes Maternal Grandfather   . Hypertension Neg Hx   . Breast cancer Neg Hx   . Coronary artery disease Neg Hx   . Colon cancer Neg Hx   . Esophageal cancer Neg Hx   . Stomach cancer Neg Hx   . Rectal cancer Neg Hx     SOCIAL HISTORY: Social History   Social History  . Marital status: Married    Spouse name: N/A  . Number of children: 1  . Years of education: N/A   Occupational History  . 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 Smoking status: Never Smoker  . Smokeless tobacco: Never Used  . Alcohol use Yes     Comment: socially  . Drug use: No  . Sexual activity: Not on file   Other Topics Concern  . Not on file   Social History Narrative   2 step children, 1 child , [place]- patient and her husband, he recently stop driving      REVIEW OF SYSTEMS: Constitutional: No fevers, chills, or sweats, no generalized fatigue, change in appetite Eyes: No visual changes, double vision, eye pain Ear, nose and throat: No hearing loss, ear pain, nasal congestion, sore throat Cardiovascular: No chest pain, palpitations Respiratory:  No shortness of breath at  rest or with exertion, wheezes GastrointestinaI: No nausea, vomiting, diarrhea, abdominal pain, fecal incontinence Genitourinary:  No dysuria, urinary retention or frequency Musculoskeletal:  No neck pain, back pain Integumentary: No rash, pruritus, skin lesions Neurological: as above Psychiatric: No depression, insomnia, anxiety Endocrine: No palpitations, fatigue, diaphoresis, mood swings, change in appetite, change in weight, increased thirst Hematologic/Lymphatic:  No purpura, petechiae. Allergic/Immunologic: no itchy/runny eyes, nasal congestion, recent allergic reactions, rashes  PHYSICAL EXAM: Vitals:   05/25/16 1132  BP: 110/70  Pulse: 81  General: No acute distress.  Patient appears well-groomed.  normal body habitus. Head:  Normocephalic/atraumatic Eyes:  Fundi examined but not visualized Neck: supple, no paraspinal tenderness, full range of motion Heart:  Regular rate and rhythm Lungs:  Clear to auscultation bilaterally Back: No paraspinal tenderness Neurological Exam: alert and oriented to person, place, and time. Attention span and concentration intact, recent and remote memory intact, fund of knowledge intact.  Speech fluent and not dysarthric, language intact.  CN II-XII intact. Bulk and tone normal, muscle strength 5/5 throughout.  Sensation to light touch  intact.  Deep tendon reflexes 2+ throughout.  Finger to nose testing intact.  Gait normal  IMPRESSION: Migraine.  Possible recurrence due to change in weather or perhaps the new air freshener.   PLAN: She will focus on lifestyle modification:  increase water, eliminate soda, exercise, remove air freshener from her home Continue nortriptyline 50mg  at bedtime for now Sumatriptan 100mg  as needed (limit pain relievers to no more than 2 days out of the week) Follow up in 4 months.  Metta Clines, DO  CC: Kathlene November, MD

## 2016-06-19 ENCOUNTER — Telehealth: Payer: Self-pay | Admitting: *Deleted

## 2016-06-19 NOTE — Telephone Encounter (Signed)
Called patient and left message to return call

## 2016-06-20 ENCOUNTER — Other Ambulatory Visit: Payer: Self-pay | Admitting: Neurology

## 2016-07-03 NOTE — Progress Notes (Addendum)
Subjective:   Cynthia [name] is a 71 y.o. female who presents for Medicare Annual (Subsequent) preventive examination.  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 Reports that this sometimes feels "overwhelming," but overall she still feels like her normal self and she is making time for self-care activities-coffee dates w/ friends, book club, etc.   She notes that she has itching and possible rash in her groin area, suprapubic tenderness at night that is relieved by voiding, and occasional foul smelling urine for several months. She has not had any fever, burning w/ urination, or other abdominal pain.  Review of Systems:  No ROS.  Medicare Wellness Visit.  Sleep patterns: has frequent nighttime awakenings, is not rested upon awakening, uses sleep for migraine relief, gets up 1-2 times nightly to void and sleeps 7 hours nightly.   Home Safety/Smoke Alarms: Feels safe in home. Smoke alarms in place. Security system in place.   Living environment; residence and Firearm Safety: Lives w/ husband.  2-story house, walk-in shower, equipment: Stairs, Type: Stair Lift, no firearms. Pt is able to navigate stairs, but does have stair lift for her husband. Seat Belt Safety/Bike Helmet: Wears seat belt.   Counseling:   Eye Exam- Dr. Len Childs every 6 months Dental- Dr. Milus Glazier every 6 months  Female:   Pap- Aged out       [place]- last 11/20/2013. BI-RADS CATEGORY 1: Negative.     Dexa scan- last 07/30/2014. Osteopenia.  [facility]- 12/17/2013 poly removed. Follow up in 5 years.  Cardiac Risk Factors include: advanced age (>79men, >30 women);dyslipidemia     Objective:     Vitals: BP 114/72 (BP Location: Right Arm, Patient Position: Sitting, Cuff Size: Normal)   Pulse 83   Ht 5\' 5"  (1.651 m)   Wt 162 lb 9.6 oz (73.8 kg)   SpO2 98%   BMI 27.06 kg/m   Body mass index is 27.06 kg/m.   Tobacco History  Smoking Status  . Never Smoker  Smokeless Tobacco  . Never Used     Counseling  given: Not Answered   Past Medical History:  Diagnosis Date  . Allergy   . Anxiety   . Arthritis   . Blood transfusion without reported diagnosis   . Constipation, chronic   . DEGENERATIVE JOINT DISEASE 06/02/2010  . Depression   . Glaucoma 2014   Right eye  . Headache(784.0)   . Hyperlipidemia    hx of  . Osteopenia   . Psoriasis   . PUD (peptic ulcer disease) 2012   Upper GI bleed d/t NSAIDs  . RLS (restless legs syndrome)    robaxin prn   Past Surgical History:  Procedure Laterality Date  . ANKLE FRACTURE SURGERY Left    surgery 12-11 (LEFT)  . CESAREAN SECTION  1969   Family History  Problem Relation Age of Onset  . Aneurysm Mother     brain  . Celiac disease Brother   . Diabetes Maternal Grandfather   . Hypertension Neg Hx   . Breast cancer Neg Hx   . Coronary artery disease Neg Hx   . Colon cancer Neg Hx   . Esophageal cancer Neg Hx   . Stomach cancer Neg Hx   . Rectal cancer Neg Hx    History  Sexual Activity  . Sexual activity: Not on file    Outpatient Encounter Prescriptions as of 07/04/2016  Medication Sig  . azelastine (ASTELIN) 0.1 % nasal spray Place 2 sprays into  both nostrils at bedtime. (Patient taking differently: Place 2 sprays into both nostrils at bedtime as needed. )  . B COMPLEX VITAMINS PO Take by mouth daily.  . brimonidine-timolol (COMBIGAN) 0.2-0.5 % ophthalmic solution Place 1 drop into the right eye daily.   Marland Kitchen buPROPion (WELLBUTRIN XL) 300 MG 24 hr tablet Take 1 tablet (300 mg total) by mouth daily.  . calcipotriene (DOVONOX) 0.005 % cream   . cetirizine (ZYRTEC) 10 MG tablet Take 10 mg by mouth daily.    . Cholecalciferol (VITAMIN D-400 PO) Take by mouth.    . clobetasol (TEMOVATE) 0.05 % external solution Apply 1 application topically 2 (two) times daily as needed.   Marland Kitchen MAGNESIUM PO Take 1 tablet by mouth daily.  . nortriptyline (PAMELOR) 50 MG capsule TAKE ONE CAPSULE BY MOUTH EVERY NIGHT AT BEDTIME (Patient taking differently:  TAKE ONE CAPSULE BY MOUTH EVERY NIGHT AT BEDTIME PRN)  . Omega-3 Fatty Acids (FISH OIL) 1000 MG CPDR Take by mouth.    Marland Kitchen omeprazole (PRILOSEC) 20 MG capsule Take 1 capsule (20 mg total) by mouth daily. (Patient taking differently: Take 20 mg by mouth every other day. )  . SUMAtriptan (IMITREX) 100 MG tablet Take 1 tab at earliest onset of headache.  May repeat once in 2 hours if headache persists or recurs.  . triamcinolone cream (KENALOG) 0.1 % Apply 1 application topically as needed.   . calcium gluconate 500 MG tablet Take 1,000 mg by mouth daily.    . methocarbamol (ROBAXIN) 500 MG tablet take 1 tablet by mouth three times a day if needed for RLS (Patient not taking: Reported on 07/04/2016)   No facility-administered encounter medications on file as of 07/04/2016.     Activities of Daily Living In your present state of health, do you have any difficulty performing the following activities: 07/04/2016 12/27/2015  Hearing? N N  Vision? N N  Difficulty concentrating or making decisions? N N  Walking or climbing stairs? N N  Dressing or bathing? N N  Doing errands, shopping? N N  Preparing Food and eating ? N -  Using the Toilet? N -  In the past six months, have you accidently leaked urine? N -  Do you have problems with loss of bowel control? N -  Managing your Medications? N -  Managing your Finances? N -  Housekeeping or managing your Housekeeping? Y -  Some recent data might be hidden    Patient Care Team: Colon Branch, MD as PCP - General Amy Genia Harold, PA-C as Physician Assistant (Gastroenterology)    Assessment:    Physical assessment deferred to PCP.  Exercise Activities and Dietary recommendations Current Exercise Habits: The patient does not participate in regular exercise at present  Diet (meal preparation, eat out, water intake, caffeinated beverages, dairy products, fruits and vegetables): in general, a "healthy" diet  , well balanced, on average, 2-3 meals per day,  low fat/ cholesterol. Tries to eat fresh fruits and vegetables daily. Trying to increase water intake. She has stopped drinking soda. Breakfast: yogurt and granola    Goals    . Increase physical activity    . Increase water intake      Fall Risk Fall Risk  07/04/2016 02/01/2016 12/27/2015 06/01/2015 12/25/2014  Falls in the past year? No No No No No   Depression Screen PHQ 2/9 Scores 07/04/2016 12/27/2015 12/25/2014 07/29/2013  PHQ - 2 Score 1 0 0 0     Cognitive Function MMSE -  Mini Mental State Exam 07/04/2016  Orientation to time 5  Orientation to Place 5  Registration 3  Attention/ Calculation 5  Recall 2  Language- name 2 objects 2  Language- repeat 1  Language- follow 3 step command 3  Language- read & follow direction 1  Write a sentence 1  Copy design 1  Total score 29        Immunization History  Administered Date(s) Administered  . Influenza Split 03/13/2011, 01/31/2012  . Influenza Whole 03/05/2007, 02/26/2008, 01/28/2010  . Influenza, High Dose Seasonal PF 03/06/2013, 02/15/2015, 02/14/2016  . Pneumococcal Conjugate-13 07/03/2014  . Pneumococcal Polysaccharide-23 11/09/2009  . Td 05/16/1999, 11/09/2009  . Zoster 12/06/2007   Screening Tests Health Maintenance  Topic Date Due  . MAMMOGRAM  11/21/2014  . Hepatitis C Screening  07/04/2016 (Originally 12-19-1945)  . COLONOSCOPY  12/18/2018  . TETANUS/TDAP  11/10/2019  . INFLUENZA VACCINE  Completed  . DEXA SCAN  Completed  . PNA vac Low Risk Adult  Completed      Plan:   Follow-up w/ PCP as scheduled. Acute appt scheduled for tomorrow for vaginal itching/? UTI.  Continue to eat heart healthy diet (full of fruits, vegetables, whole grains, lean protein, water--limit salt, fat, and sugar intake) and increase physical activity as tolerated.  Continue doing brain stimulating activities (puzzles, reading, adult coloring books, staying active) to keep memory sharp.   Orders placed for MMG and DEXA  scans.  Create and bring a copy of your advance directives to your next office visit.  During the course of the visit the patient was educated and counseled about the following appropriate screening and preventive services:   Vaccines to include Pneumoccal, Influenza, Hepatitis B, Td, Zostavax, HCV  Cardiovascular Disease  Colorectal cancer screening  Bone density screening  Diabetes screening  Glaucoma screening  Mammography/PAP  Nutrition counseling   Patient Instructions (the written plan) was given to the patient.   Dorrene German, RN  07/04/2016   Kathlene November, MD

## 2016-07-03 NOTE — Progress Notes (Signed)
Pre visit review using our clinic review tool, if applicable. No additional management support is needed unless otherwise documented below in the visit note. 

## 2016-07-04 ENCOUNTER — Ambulatory Visit (INDEPENDENT_AMBULATORY_CARE_PROVIDER_SITE_OTHER): Payer: Medicare Other | Admitting: *Deleted

## 2016-07-04 ENCOUNTER — Encounter: Payer: Self-pay | Admitting: *Deleted

## 2016-07-04 VITALS — BP 114/72 | HR 83 | Ht 65.0 in | Wt 162.6 lb

## 2016-07-04 DIAGNOSIS — Z780 Asymptomatic menopausal state: Secondary | ICD-10-CM

## 2016-07-04 DIAGNOSIS — Z1231 Encounter for screening mammogram for malignant neoplasm of breast: Secondary | ICD-10-CM

## 2016-07-04 DIAGNOSIS — Z0000 Encounter for general adult medical examination without abnormal findings: Secondary | ICD-10-CM | POA: Diagnosis not present

## 2016-07-04 DIAGNOSIS — Z1239 Encounter for other screening for malignant neoplasm of breast: Secondary | ICD-10-CM

## 2016-07-04 NOTE — Patient Instructions (Signed)
  Cynthia [name] , Thank you for taking time to come for your Medicare Wellness Visit. I appreciate your ongoing commitment to your health goals. Please review the following plan we discussed and let me know if I can assist you in the future.   If you'd like, you can stop by the imaging department on the 1st floor to schedule your mammogram and bone density. Otherwise, we will call you to schedule.   Continue to eat heart healthy diet (full of fruits, vegetables, whole grains, lean protein, water--limit salt, fat, and sugar intake) and increase physical activity as tolerated.  Continue doing brain stimulating activities (puzzles, reading, adult coloring books, staying active) to keep memory sharp.   These are the goals we discussed: Goals    . Increase physical activity    . Increase water intake       This is a list of the screening recommended for you and due dates:  Health Maintenance  Topic Date Due  . Mammogram  11/21/2014  .  Hepatitis C: One time screening is recommended by Center for Disease Control  (CDC) for  adults born from [birth year] through 1966.   07/04/2016*  . Colon Cancer Screening  12/18/2018  . Tetanus Vaccine  11/10/2019  . Flu Shot  Completed  . DEXA scan (bone density measurement)  Completed  . Pneumonia vaccines  Completed  *Topic was postponed. The date shown is not the original due date.

## 2016-07-05 ENCOUNTER — Other Ambulatory Visit (HOSPITAL_COMMUNITY)
Admission: RE | Admit: 2016-07-05 | Discharge: 2016-07-05 | Disposition: A | Discharge status: Home / Self Care | Payer: Medicare Other | Source: Ambulatory Visit | Attending: Internal Medicine | Admitting: Internal Medicine

## 2016-07-05 ENCOUNTER — Telehealth: Payer: Self-pay | Admitting: Internal Medicine

## 2016-07-05 ENCOUNTER — Encounter: Payer: Self-pay | Admitting: Internal Medicine

## 2016-07-05 ENCOUNTER — Ambulatory Visit (INDEPENDENT_AMBULATORY_CARE_PROVIDER_SITE_OTHER): Payer: Medicare Other | Admitting: Internal Medicine

## 2016-07-05 VITALS — BP 118/78 | HR 81 | Temp 98.1°F | Resp 14 | Ht 66.0 in | Wt 162.4 lb

## 2016-07-05 DIAGNOSIS — R102 Pelvic and perineal pain: Secondary | ICD-10-CM | POA: Diagnosis present

## 2016-07-05 DIAGNOSIS — L309 Dermatitis, unspecified: Secondary | ICD-10-CM

## 2016-07-05 DIAGNOSIS — G43809 Other migraine, not intractable, without status migrainosus: Secondary | ICD-10-CM

## 2016-07-05 LAB — POC URINALSYSI DIPSTICK (AUTOMATED)
BILIRUBIN UA: NEGATIVE
Blood, UA: NEGATIVE
GLUCOSE UA: NEGATIVE
KETONES UA: NEGATIVE
Nitrite, UA: NEGATIVE
Protein, UA: NEGATIVE
SPEC GRAV UA: 1.015
UROBILINOGEN UA: 0.2
pH, UA: 6

## 2016-07-05 MED ORDER — HYDROCORTISONE 2.5 % EX CREA: TOPICAL_CREAM | Freq: Two times a day (BID) | CUTANEOUS | 0 refills | Status: AC

## 2016-07-05 MED ORDER — METHOCARBAMOL 500 MG PO TABS: ORAL_TABLET | ORAL | 3 refills | Status: DC

## 2016-07-05 MED ORDER — KETOCONAZOLE 2 % EX CREA: 1.0000 "application " | TOPICAL_CREAM | Freq: Every day | CUTANEOUS | 0 refills | Status: DC

## 2016-07-05 NOTE — Telephone Encounter (Signed)
Rx sent 

## 2016-07-05 NOTE — Progress Notes (Signed)
Subjective:    Patient ID: Cynthia [name], female    DOB: December 01, 1945, 71 y.o.   MRN: [id number]  DOS:  07/05/2016 Type of visit - description : acute Interval history: Having on and off itching and the groin areas since December. Also has developed a rash and is getting larger recently. 2-3 days ago developed some mild dysuria and suprapubic discomfort mostly at night.  Review of Systems Denies fever chills No nausea or vomiting No flank pain No vaginal discharge or bleeding. No recent intercourse. She has hemorrhoids with chronic on and off rectal pain/bleeding/itching.   Past Medical History:  Diagnosis Date  . Allergy   . Anxiety   . Arthritis   . Blood transfusion without reported diagnosis   . Constipation, chronic   . DEGENERATIVE JOINT DISEASE 06/02/2010  . Depression   . Glaucoma 2014   Right eye  . Headache(784.0)   . Hyperlipidemia    hx of  . Osteopenia   . Psoriasis   . PUD (peptic ulcer disease) 2012   Upper GI bleed d/t NSAIDs  . RLS (restless legs syndrome)    robaxin prn    Past Surgical History:  Procedure Laterality Date  . ANKLE FRACTURE SURGERY Left    surgery 12-11 (LEFT)  . [place]    Social History   Social History  . Marital status: Married    Spouse name: N/A  . Number of children: 1  . Years of education: N/A   Occupational History  . 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 Smoking status: Never Smoker  . Smokeless tobacco: Never Used  . Alcohol use Yes     Comment: socially  . Drug use: No  . Sexual activity: Not on file   Other Topics Concern  . Not on file   Social History Narrative   2 step children, 1 child , [place]- patient and her husband, he recently stop driving        Allergies as of 07/05/2016      Reactions   Motrin [ibuprofen]    GI bleed   Pollen Extract    Statins    "delibitating pain all over body"   Sulfonamide Derivatives    Pt unsure of  reaction   Ciprofloxacin Swelling, Rash      Medication List       Accurate as of 07/05/16 11:59 PM. Always use your most recent med list.          acetaminophen 325 MG tablet Commonly known as:  TYLENOL Take 650 mg by mouth as needed.   azelastine 0.1 % nasal spray Commonly known as:  ASTELIN Place 2 sprays into both nostrils at bedtime.   B COMPLEX VITAMINS PO Take by mouth daily.   buPROPion 300 MG 24 hr tablet Commonly known as:  WELLBUTRIN XL Take 1 tablet (300 mg total) by mouth daily.   calcipotriene 0.005 % cream Commonly known as:  DOVONOX   calcium gluconate 500 MG tablet Take 1,000 mg by mouth daily.   cetirizine 10 MG tablet Commonly known as:  ZYRTEC Take 10 mg by mouth daily.   clobetasol 0.05 % external solution Commonly known as:  TEMOVATE Apply 1 application topically 2 (two) times daily as needed.   COMBIGAN 0.2-0.5 % ophthalmic solution Generic drug:  brimonidine-timolol Place 1 drop into the right eye daily.   Fish Oil 1000 MG Cpdr Take by  mouth.   guaiFENesin 600 MG 12 hr tablet Commonly known as:  MUCINEX Take by mouth as needed.   hydrocortisone 2.5 % cream Apply topically 2 (two) times daily.   ketoconazole 2 % cream Commonly known as:  NIZORAL Apply 1 application topically daily.   MAGNESIUM PO Take 1 tablet by mouth daily.   methocarbamol 500 MG tablet Commonly known as:  ROBAXIN take 1 tablet by mouth three times a day if needed for RLS   nortriptyline 50 MG capsule Commonly known as:  PAMELOR TAKE ONE CAPSULE BY MOUTH EVERY NIGHT AT BEDTIME   omeprazole 20 MG capsule Commonly known as:  PRILOSEC Take 1 capsule (20 mg total) by mouth daily.   polyethylene glycol packet Commonly known as:  MIRALAX / GLYCOLAX Take 17 g by mouth daily as needed.   SUMAtriptan 100 MG tablet Commonly known as:  IMITREX Take 1 tab at earliest onset of headache.  May repeat once in 2 hours if headache persists or recurs.     triamcinolone cream 0.1 % Commonly known as:  KENALOG Apply 1 application topically as needed.   VITAMIN D-400 PO Take by mouth.          Objective:   Physical Exam  Genitourinary:      BP 118/78 (BP Location: Left Arm, Patient Position: Sitting, Cuff Size: Small)   Pulse 81   Temp 98.1 F (36.7 C) (Oral)   Resp 14   Ht 5\' 6"  (1.676 m)   Wt 162 lb 6 oz (73.7 kg)   SpO2 94%   BMI 26.21 kg/m   General:   Well developed, well nourished . NAD.  HEENT:  Normocephalic . Face symmetric, atraumatic  Abdomen:  Not distended, soft, non-tender. No rebound or rigidity.   Skin: Not pale. Not jaundice Neurologic:  alert & oriented X3.  Speech normal, gait appropriate for age and unassisted Psych--  Cognition and judgment appear intact.  Cooperative with normal attention span and concentration.  Behavior appropriate. No anxious or depressed appearing.     Assessment & Plan:   Assessment> Hyperlipidemia Anxiety depression DJD Headaches Osteopenia: 07-2014 T score of -1.7, Rx calcium and vitamin D PUD due to NSAIDs RLS - robaxin Glaucoma Chronic constipation Dx LSC (lichen )  ~ [id number]   PLAN: Dermatitis: at  the vulvar area, unclear etiology, probably fungal. Rx ketoconazole and hydrocortisone twice a day for 10 days. Wet prep  pending UTI? UA with trace glucose. Check a UA , urine culture, treat accordingly.  Headaches: Since the last time she was here, saw neurology, dx-migraines  RTC at her convenience: CPX, fasting

## 2016-07-05 NOTE — Progress Notes (Signed)
Pre visit review using our clinic review tool, if applicable. No additional management support is needed unless otherwise documented below in the visit note. 

## 2016-07-05 NOTE — Telephone Encounter (Signed)
Caller name: Relationship to patient: Self Can be reached: [phone]  Pharmacy:  [facility] [place], [place] [phone] (Phone) [phone] (Fax)     Reason for call: Refill methocarbamol (ROBAXIN) 500 MG tablet

## 2016-07-05 NOTE — Patient Instructions (Addendum)
Apply the creams twice a day for one week, if you  need a refill let us know.  Schedule a physical exam at your convenience, fasting

## 2016-07-06 LAB — URINALYSIS, ROUTINE W REFLEX MICROSCOPIC
BILIRUBIN URINE: NEGATIVE
Hgb urine dipstick: NEGATIVE
Ketones, ur: NEGATIVE
Leukocytes, UA: NEGATIVE
NITRITE: NEGATIVE
RBC / HPF: NONE SEEN (ref 0–?)
Total Protein, Urine: NEGATIVE
UROBILINOGEN UA: 0.2 (ref 0.0–1.0)
Urine Glucose: NEGATIVE
pH: 7 (ref 5.0–8.0)

## 2016-07-06 LAB — URINE CULTURE: Organism ID, Bacteria: NO GROWTH

## 2016-07-06 NOTE — Assessment & Plan Note (Signed)
Dermatitis: at  the vulvar area, unclear etiology, probably fungal. Rx ketoconazole and hydrocortisone twice a day for 10 days. Wet prep  pending UTI? UA with trace glucose. Check a UA , urine culture, treat accordingly.  Headaches: Since the last time she was here, saw neurology, dx-migraines  RTC at her convenience: CPX, fasting

## 2016-07-07 LAB — CERVICOVAGINAL ANCILLARY ONLY: Wet Prep (BD Affirm): NEGATIVE

## 2016-07-13 ENCOUNTER — Encounter: Payer: Self-pay | Admitting: Internal Medicine

## 2016-07-17 ENCOUNTER — Telehealth: Payer: Self-pay

## 2016-07-17 NOTE — Telephone Encounter (Signed)
Received PA approval through 05/14/2017, PA reference number [id number].

## 2016-07-17 NOTE — Telephone Encounter (Signed)
PA initiated via Covermymeds; [id number]. Awaiting determination.

## 2016-07-17 NOTE — Telephone Encounter (Signed)
Form received, insurance needing further information. Form completed and faxed to [employer] at [phone]. Form sent for scanning. Received fax confirmation.

## 2016-07-25 ENCOUNTER — Ambulatory Visit (HOSPITAL_BASED_OUTPATIENT_CLINIC_OR_DEPARTMENT_OTHER): Payer: Medicare Other

## 2016-07-25 ENCOUNTER — Other Ambulatory Visit (HOSPITAL_BASED_OUTPATIENT_CLINIC_OR_DEPARTMENT_OTHER): Payer: Medicare Other

## 2016-07-27 ENCOUNTER — Ambulatory Visit (HOSPITAL_BASED_OUTPATIENT_CLINIC_OR_DEPARTMENT_OTHER)
Admission: RE | Admit: 2016-07-27 | Discharge: 2016-07-27 | Disposition: A | Discharge status: Home / Self Care | Payer: Medicare Other | Source: Ambulatory Visit | Attending: Internal Medicine | Admitting: Internal Medicine

## 2016-07-27 ENCOUNTER — Encounter (HOSPITAL_BASED_OUTPATIENT_CLINIC_OR_DEPARTMENT_OTHER): Payer: Self-pay

## 2016-07-27 DIAGNOSIS — Z780 Asymptomatic menopausal state: Secondary | ICD-10-CM

## 2016-07-27 DIAGNOSIS — M8588 Other specified disorders of bone density and structure, other site: Secondary | ICD-10-CM | POA: Insufficient documentation

## 2016-07-27 DIAGNOSIS — Z1231 Encounter for screening mammogram for malignant neoplasm of breast: Secondary | ICD-10-CM | POA: Diagnosis present

## 2016-07-27 DIAGNOSIS — M85852 Other specified disorders of bone density and structure, left thigh: Secondary | ICD-10-CM | POA: Diagnosis not present

## 2016-07-27 DIAGNOSIS — Z1239 Encounter for other screening for malignant neoplasm of breast: Secondary | ICD-10-CM

## 2016-08-07 ENCOUNTER — Other Ambulatory Visit: Payer: Self-pay | Admitting: Internal Medicine

## 2016-08-18 ENCOUNTER — Other Ambulatory Visit: Payer: Self-pay | Admitting: Neurology

## 2016-09-22 ENCOUNTER — Ambulatory Visit: Payer: Medicare Other | Admitting: Neurology

## 2016-10-04 ENCOUNTER — Other Ambulatory Visit: Payer: Self-pay | Admitting: Internal Medicine

## 2016-10-09 ENCOUNTER — Encounter: Payer: Self-pay | Admitting: Internal Medicine

## 2016-10-13 ENCOUNTER — Encounter: Payer: Self-pay | Admitting: Internal Medicine

## 2016-10-13 ENCOUNTER — Ambulatory Visit (INDEPENDENT_AMBULATORY_CARE_PROVIDER_SITE_OTHER): Payer: Medicare Other | Admitting: Internal Medicine

## 2016-10-13 VITALS — BP 116/66 | HR 66 | Temp 98.0°F | Resp 14 | Ht 66.0 in | Wt 164.1 lb

## 2016-10-13 DIAGNOSIS — H5789 Other specified disorders of eye and adnexa: Secondary | ICD-10-CM

## 2016-10-13 DIAGNOSIS — H578 Other specified disorders of eye and adnexa: Secondary | ICD-10-CM

## 2016-10-13 NOTE — Assessment & Plan Note (Signed)
Conjunctivitis: Suspect infection rather than and allergic conjunctivitis but other conditions are also possible .I asked her eye doctor (Dr Len Childs)  to see her and she has an appointment for today and 12:30 PM. Patient in agreement.

## 2016-10-13 NOTE — Progress Notes (Signed)
Subjective:    Patient ID: Cynthia [name], female    DOB: 03-Apr-1946, 71 y.o.   MRN: [id number]  DOS:  10/13/2016 Type of visit - description : Acute visit Interval history: 2 weeks history of soreness and a feeling of sand at the right eye. Vision is sometimes cloudy. Conjunctivae is moderately red. Eyes  not watery, no discharge. She's taking antihistamine eyedrops OTC. Vision is actually okay. No light sensitivity  Review of Systems   Past Medical History:  Diagnosis Date  . Allergy   . Anxiety   . Arthritis   . Blood transfusion without reported diagnosis   . Constipation, chronic   . DEGENERATIVE JOINT DISEASE 06/02/2010  . Depression   . Glaucoma 2014   Right eye  . Headache(784.0)   . Hyperlipidemia    hx of  . Osteopenia   . Psoriasis   . PUD (peptic ulcer disease) 2012   Upper GI bleed d/t NSAIDs  . RLS (restless legs syndrome)    robaxin prn    Past Surgical History:  Procedure Laterality Date  . ANKLE FRACTURE SURGERY Left    surgery 12-11 (LEFT)  . [place]    Social History   Social History  . Marital status: Married    Spouse name: N/A  . Number of children: 1  . Years of education: N/A   Occupational History  . 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 Smoking status: Never Smoker  . Smokeless tobacco: Never Used  . Alcohol use Yes     Comment: socially  . Drug use: No  . Sexual activity: Not on file   Other Topics Concern  . Not on file   Social History Narrative   2 step children, 1 child , [place]- patient and her husband, he recently stop driving        Allergies as of 10/13/2016      Reactions   Motrin [ibuprofen]    GI bleed   Pollen Extract    Statins    "delibitating pain all over body"   Sulfonamide Derivatives    Pt unsure of reaction   Ciprofloxacin Swelling, Rash      Medication List       Accurate as of 10/13/16  5:42 PM. Always use your most recent med list.            acetaminophen 325 MG tablet Commonly known as:  TYLENOL Take 650 mg by mouth as needed.   azelastine 0.1 % nasal spray Commonly known as:  ASTELIN Place 2 sprays into both nostrils at bedtime.   B COMPLEX VITAMINS PO Take by mouth daily.   buPROPion 300 MG 24 hr tablet Commonly known as:  WELLBUTRIN XL Take 1 tablet (300 mg total) by mouth daily.   calcipotriene 0.005 % cream Commonly known as:  DOVONOX   calcium gluconate 500 MG tablet Take 1,000 mg by mouth daily.   cetirizine 10 MG tablet Commonly known as:  ZYRTEC Take 10 mg by mouth daily.   clobetasol 0.05 % external solution Commonly known as:  TEMOVATE Apply 1 application topically 2 (two) times daily as needed.   COMBIGAN 0.2-0.5 % ophthalmic solution Generic drug:  brimonidine-timolol Place 1 drop into the right eye daily.   Fish Oil 1000 MG Cpdr Take by mouth.   guaiFENesin 600 MG 12 hr tablet Commonly known as:  MUCINEX Take by mouth as needed.  hydrocortisone 2.5 % cream Apply topically 2 (two) times daily.   ketoconazole 2 % cream Commonly known as:  NIZORAL Apply 1 application topically daily.   MAGNESIUM PO Take 1 tablet by mouth daily.   methocarbamol 500 MG tablet Commonly known as:  ROBAXIN take 1 tablet by mouth three times a day if needed for RLS   nortriptyline 50 MG capsule Commonly known as:  PAMELOR TAKE ONE CAPSULE BY MOUTH EVERY NIGHT AT BEDTIME   omeprazole 20 MG capsule Commonly known as:  PRILOSEC Take 1 capsule (20 mg total) by mouth daily.   polyethylene glycol packet Commonly known as:  MIRALAX / GLYCOLAX Take 17 g by mouth daily as needed.   SUMAtriptan 100 MG tablet Commonly known as:  IMITREX TAKE 1 TABLET BY MOUTH AT EARLIEST ONSET OF HEADACHE. MAY REPEAT ONCE IN 2 HOURS IF HEADACHE PERSISTS OR RECURS.   triamcinolone cream 0.1 % Commonly known as:  KENALOG Apply 1 application topically as needed.   VITAMIN D-400 PO Take by mouth.           Objective:   Physical Exam BP 116/66 (BP Location: Left Arm, Patient Position: Sitting, Cuff Size: Small)   Pulse 66   Temp 98 F (36.7 C) (Oral)   Resp 14   Ht 5\' 6"  (1.676 m)   Wt 164 lb 2 oz (74.4 kg)   SpO2 98%   BMI 26.49 kg/m  General:   Well developed, well nourished . NAD.  HEENT:  Normocephalic . Face symmetric, atraumatic  eyes: Pupils equal and reactive, EOMI. Anterior chambers normal. Right eye conjunctiva slightly red but no watery. No light sensitivity on exam. Skin: Not pale. Not jaundice Neurologic:  alert & oriented X3.  Speech normal, gait appropriate for age and unassisted Psych--  Cognition and judgment appear intact.  Cooperative with normal attention span and concentration.  Behavior appropriate. No anxious or depressed appearing.      Assessment & Plan:   Assessment> Hyperlipidemia Anxiety depression DJD Headaches Osteopenia: 07-2014 T score of -1.7, Rx calcium and vitamin D PUD due to NSAIDs RLS - robaxin Glaucoma Chronic constipation Dx LSC (lichen )  ~ 2060   PLAN: Conjunctivitis: Suspect infection rather than and allergic conjunctivitis but other conditions are also possible . I asked her eye doctor (Dr Len Childs)  to see her and she has an appointment for today and 12:30 PM. Patient in agreement.

## 2016-10-13 NOTE — Patient Instructions (Signed)
Please go see her eye doctor at 12:30

## 2016-10-13 NOTE — Progress Notes (Signed)
Pre visit review using our clinic review tool, if applicable. No additional management support is needed unless otherwise documented below in the visit note. 

## 2016-10-16 ENCOUNTER — Encounter: Payer: Medicare Other | Admitting: Internal Medicine

## 2016-11-03 ENCOUNTER — Ambulatory Visit (INDEPENDENT_AMBULATORY_CARE_PROVIDER_SITE_OTHER): Payer: Medicare Other | Admitting: Neurology

## 2016-11-03 ENCOUNTER — Encounter: Payer: Self-pay | Admitting: Neurology

## 2016-11-03 VITALS — BP 100/62 | HR 80 | Ht 65.0 in | Wt 162.0 lb

## 2016-11-03 DIAGNOSIS — F419 Anxiety disorder, unspecified: Secondary | ICD-10-CM

## 2016-11-03 DIAGNOSIS — F329 Major depressive disorder, single episode, unspecified: Secondary | ICD-10-CM

## 2016-11-03 DIAGNOSIS — G43809 Other migraine, not intractable, without status migrainosus: Secondary | ICD-10-CM

## 2016-11-03 DIAGNOSIS — F32A Depression, unspecified: Secondary | ICD-10-CM

## 2016-11-03 MED ORDER — SERTRALINE HCL 50 MG PO TABS: 50.0000 mg | ORAL_TABLET | Freq: Every day | ORAL | 5 refills | Status: DC

## 2016-11-03 NOTE — Patient Instructions (Addendum)
1.  Start sertraline 50mg  daily for headache and depression 2.  Use sumatriptan as needed (1 at earliest onset of migraine and may repeat once after 2 hours if needed) 3.  Try taking a methocarbamol at bedtime for neck spasms and see if it prevents migraine 4.  Use a non-contoured memory foam pillow at night.  Keep neck in neutral position at night. 5.  Restart neck exercises  6.  Follow up in 6 months.

## 2016-11-03 NOTE — Progress Notes (Signed)
NEUROLOGY FOLLOW UP OFFICE NOTE  Cynthia [name] [id number]  HISTORY OF PRESENT ILLNESS: Cynthia [name] is a 71 year old right-handed female with depression, glaucoma and degenerative joint disease who follows up for chronic migraine   UPDATE: About 3 months ago, she discontinued nortriptyline but does not remember why.  Headaches had actually improved to once a week.  About 5 days ago, she has been getting them every night, waking her up from sleep.  She has associated neck stiffness.  She has a history of neck pain and has seen a chiropractor in the past.  She also reports increased stress and depression due to multiple outside factors (her 53 year old husband is not well, her son with a drug addiction was released from prison, and there have been changes at the church).  She has modified her lifestyle by exercising, increasing water intake and cutting out soda. Intensity:  7-8/10; January:  7-8/10 Duration:  20 minutes with sumatriptan; January:  3 to 5 hours Frequency:  Once a week until last week (5 days in row); January:  4 days over past 10 days Triggers:  uncertain.  She can't think of a trigger.  She does report that her cleaning lady started using a new air freshener Relieving factors: sleep Current NSAIDS:  No (contraindicated due to GI bleed) Current analgesics:  no Current triptans:  sumatriptan 100mg  Current anti-emetic:  no Current Antihypertensive medications:  no Current Antidepressant medications:  bupropion Current Anticonvulsant medications:  no Current Vitamins/Herbal/Supplements:  B complex, Mg Current Antihistamines/Decongestants:  Mucinex, Astelin Other therapy:  no   Diet: Increased water, stopped soda. Alcohol: no Smoking:  no Exercise:  yes Depression/anxiety: increased   HISTORY: She started having headaches once in a while around 2015, but have progressively become more frequent in 2016.  They are usually left sided, involving the ear, but also may be  right sided (involving the sinuses) or in band-like distribution.  It is both throbbing and non-throbbing.  Initially, it is usually 5/10 but severe episodes are 7-8/10.  Sometimes there is nausea.  There is phonophobia.  There is no photophobia or visual disturbance.  Initially, it lasts a couple of hours and occuring 15 days out of the month (3 to 4 days severe).  She reports sensation of congestion but no runny nose.  CT of sinuses showed opacified right sphenoid sinus and ethmoid air cell.  She was treated a couple of times with Augmentin and prednisone, which was effective for a while but headaches then returned.  MRI of brain from 06/10/15 was normal.  Sed Rate from 12/27/15 was 10.     Past medication:  Tylenol  PAST MEDICAL HISTORY: Past Medical History:  Diagnosis Date  . Allergy   . Anxiety   . Arthritis   . Blood transfusion without reported diagnosis   . Constipation, chronic   . DEGENERATIVE JOINT DISEASE 06/02/2010  . Depression   . Glaucoma 2014   Right eye  . Headache(784.0)   . Hyperlipidemia    hx of  . Osteopenia   . Psoriasis   . PUD (peptic ulcer disease) 2012   Upper GI bleed d/t NSAIDs  . RLS (restless legs syndrome)    robaxin prn    MEDICATIONS: Current Outpatient Prescriptions on File Prior to Visit  Medication Sig Dispense Refill  . acetaminophen (TYLENOL) 325 MG tablet Take 650 mg by mouth as needed.    Marland Kitchen azelastine (ASTELIN) 0.1 % nasal spray Place 2 sprays into  both nostrils at bedtime. (Patient taking differently: Place 2 sprays into both nostrils at bedtime as needed. ) 30 mL 6  . B COMPLEX VITAMINS PO Take by mouth daily.    Marland Kitchen buPROPion (WELLBUTRIN XL) 300 MG 24 hr tablet Take 1 tablet (300 mg total) by mouth daily. 90 tablet 1  . calcipotriene (DOVONOX) 0.005 % cream     . cetirizine (ZYRTEC) 10 MG tablet Take 10 mg by mouth daily.      . Cholecalciferol (VITAMIN D-400 PO) Take by mouth.      . clobetasol (TEMOVATE) 0.05 % external solution Apply 1  application topically 2 (two) times daily as needed.     Marland Kitchen guaiFENesin (MUCINEX) 600 MG 12 hr tablet Take by mouth as needed.    . hydrocortisone 2.5 % cream Apply topically 2 (two) times daily. 30 g 0  . ketoconazole (NIZORAL) 2 % cream Apply 1 application topically daily. 30 g 0  . methocarbamol (ROBAXIN) 500 MG tablet take 1 tablet by mouth three times a day if needed for RLS 90 tablet 3  . Omega-3 Fatty Acids (FISH OIL) 1000 MG CPDR Take by mouth.      Marland Kitchen omeprazole (PRILOSEC) 20 MG capsule Take 1 capsule (20 mg total) by mouth daily. 30 capsule 5  . polyethylene glycol (MIRALAX / GLYCOLAX) packet Take 17 g by mouth daily as needed.    . SUMAtriptan (IMITREX) 100 MG tablet TAKE 1 TABLET BY MOUTH AT EARLIEST ONSET OF HEADACHE. MAY REPEAT ONCE IN 2 HOURS IF HEADACHE PERSISTS OR RECURS. 10 tablet 1  . triamcinolone cream (KENALOG) 0.1 % Apply 1 application topically as needed.      No current facility-administered medications on file prior to visit.     ALLERGIES: Allergies  Allergen Reactions  . Motrin [Ibuprofen]     GI bleed  . Pollen Extract   . Statins     "delibitating pain all over body"  . Sulfonamide Derivatives     Pt unsure of reaction  . Ciprofloxacin Swelling and Rash    FAMILY HISTORY: Family History  Problem Relation Age of Onset  . Aneurysm Mother        brain  . Celiac disease Brother   . Diabetes Maternal Grandfather   . Hypertension Neg Hx   . Breast cancer Neg Hx   . Coronary artery disease Neg Hx   . Colon cancer Neg Hx   . Esophageal cancer Neg Hx   . Stomach cancer Neg Hx   . Rectal cancer Neg Hx     SOCIAL HISTORY: Social History   Social History  . Marital status: Married    Spouse name: N/A  . Number of children: 1  . Years of education: N/A   Occupational History  . 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 Smoking status: Never Smoker  . Smokeless tobacco: Never Used  . Alcohol use Yes     Comment:  socially  . Drug use: No  . Sexual activity: Not on file   Other Topics Concern  . Not on file   Social History Narrative   2 step children, 1 child , [place]- patient and her husband, he recently stop driving      REVIEW OF SYSTEMS: Constitutional: No fevers, chills, or sweats, no generalized fatigue, change in appetite Eyes: No visual changes, double vision, eye pain Ear, nose and throat: No hearing loss, ear pain, nasal  congestion, sore throat Cardiovascular: No chest pain, palpitations Respiratory:  No shortness of breath at rest or with exertion, wheezes GastrointestinaI: No nausea, vomiting, diarrhea, abdominal pain, fecal incontinence Genitourinary:  No dysuria, urinary retention or frequency Musculoskeletal:  No neck pain, back pain Integumentary: No rash, pruritus, skin lesions Neurological: as above Psychiatric: No depression, insomnia, anxiety Endocrine: No palpitations, fatigue, diaphoresis, mood swings, change in appetite, change in weight, increased thirst Hematologic/Lymphatic:  No purpura, petechiae. Allergic/Immunologic: no itchy/runny eyes, nasal congestion, recent allergic reactions, rashes  PHYSICAL EXAM: Vitals:   11/03/16 0853  BP: 100/62  Pulse: 80   General: No acute distress.  Patient appears well-groomed.  normal body habitus. Head:  Normocephalic/atraumatic, left suboccipital tenderness Eyes:  Fundi examined but not visualized Neck: supple, no paraspinal tenderness, full range of motion Heart:  Regular rate and rhythm Lungs:  Clear to auscultation bilaterally Back: No paraspinal tenderness Neurological Exam: alert and oriented to person, place, and time. Attention span and concentration intact, recent and remote memory intact, fund of knowledge intact.  Speech fluent and not dysarthric, language intact.  CN II-XII intact. Bulk and tone normal, muscle strength 5/5 throughout.  Sensation to light touch, temperature and vibration intact.  Deep  tendon reflexes 2+ throughout, toes downgoing.  Finger to nose and heel to shin testing intact.  Gait normal, Romberg negative.  IMPRESSION: Cervicogenic migraine.  I think migraines are triggered by neck spasms. Depression and anxiety.  PLAN: 1.  Instead of restarting nortriptyline, I will start sertraline 50mg  daily, because I think it would better address her depression in addition to headache. 2.  Use sumatriptan as needed (1 at earliest onset of migraine and may repeat once after 2 hours if needed) 3.  Try taking a methocarbamol at bedtime for neck spasms and see if it prevents migraine 4.  Use a non-contoured memory foam pillow at night.  Keep neck in neutral position at night. 5.  Restart neck exercises  6.  Follow up in 6 months.  If neck pain is still an issue, consider PT of the neck or evaluation and treatment by Dr. Charlann Boxer of Sports Medicine.  25 minutes spent face to face with patient, over 50% spent discussing etiology and management.  Metta Clines, DO  CC:  Kathlene November, MD

## 2016-11-13 ENCOUNTER — Other Ambulatory Visit: Payer: Self-pay | Admitting: Neurology

## 2016-11-22 ENCOUNTER — Ambulatory Visit (INDEPENDENT_AMBULATORY_CARE_PROVIDER_SITE_OTHER): Payer: Medicare Other | Admitting: Internal Medicine

## 2016-11-22 ENCOUNTER — Encounter: Payer: Self-pay | Admitting: Internal Medicine

## 2016-11-22 VITALS — BP 116/78 | HR 73 | Temp 98.3°F | Resp 14 | Ht 65.0 in | Wt 166.1 lb

## 2016-11-22 DIAGNOSIS — M79643 Pain in unspecified hand: Secondary | ICD-10-CM | POA: Diagnosis not present

## 2016-11-22 DIAGNOSIS — Z0001 Encounter for general adult medical examination with abnormal findings: Secondary | ICD-10-CM | POA: Diagnosis not present

## 2016-11-22 DIAGNOSIS — R51 Headache: Secondary | ICD-10-CM | POA: Diagnosis not present

## 2016-11-22 DIAGNOSIS — F419 Anxiety disorder, unspecified: Secondary | ICD-10-CM

## 2016-11-22 DIAGNOSIS — Z0000 Encounter for general adult medical examination without abnormal findings: Secondary | ICD-10-CM

## 2016-11-22 DIAGNOSIS — E785 Hyperlipidemia, unspecified: Secondary | ICD-10-CM | POA: Diagnosis not present

## 2016-11-22 DIAGNOSIS — Z1159 Encounter for screening for other viral diseases: Secondary | ICD-10-CM

## 2016-11-22 DIAGNOSIS — F329 Major depressive disorder, single episode, unspecified: Secondary | ICD-10-CM | POA: Diagnosis not present

## 2016-11-22 MED ORDER — ZOLPIDEM TARTRATE 5 MG PO TABS: 5.0000 mg | ORAL_TABLET | Freq: Every evening | ORAL | 1 refills | Status: DC | PRN

## 2016-11-22 MED ORDER — ZOSTER VAC RECOMB ADJUVANTED 50 MCG/0.5ML IM SUSR: 0.5000 mL | Freq: Once | INTRAMUSCULAR | 1 refills | Status: AC

## 2016-11-22 MED ORDER — SERTRALINE HCL 100 MG PO TABS: 100.0000 mg | ORAL_TABLET | Freq: Every day | ORAL | 3 refills | Status: DC

## 2016-11-22 NOTE — Assessment & Plan Note (Signed)
Hyperlipidemia: Currently on diet only, check labs Anxiety, depression, insomnia: On Wellbutrin and recently started Zoloft 10/2016 per neuro. Depression seems to be okay, insomnia started  few months ago mostly because she keeps thinking about her worries at night. Recommend to increase sertraline to 100 mg (hopefully will decrease intrusive thoughts at night), and rx  Ambien 5 mg. Reassess in 2 months. DJD: Pain at the hands likely a DJD, recommend Tylenol Headaches: Ongoing, last sedimentation rate normal, sees neurology regularly, sumatriptan helps but she still has frequent headaches. I am increasing the sertraline dose and starting Ambien, that may actually help the headaches. Recommend ER if the headaches are severe. RTC 2 months

## 2016-11-22 NOTE — Assessment & Plan Note (Addendum)
-  Td   2011; pneumonia shot: 2011;prevnar: 2016;shingles shot 2009; shingrex discussed and printed rx -CCS: Colonoscopy: 05/15/2002, cscope 8-15, 1 polyp, 5 years --No further  PAPs, see previous entries  - last MMG 07-2016 -Diet-exercise discussed  -Labs: CMP, FLP, CBC, TSH, hep C

## 2016-11-22 NOTE — Patient Instructions (Signed)
GO TO THE LAB : Get the blood work     GO TO THE FRONT DESK Schedule your next appointment for a  checkup in 2 months   Increase sertraline to 100 mg daily  Start Ambien 5 mg one tablet at bedtime to help his sleep  If you have a severe or unusual headache: Call or go to the ER

## 2016-11-22 NOTE — Progress Notes (Signed)
Subjective:    Patient ID: Cynthia [name], female    DOB: 1945-05-20, 71 y.o.   MRN: [id number]  DOS:  11/22/2016 Type of visit - description : cpx Interval history: In addition to a CPX a number of other things were addressed today. Insomnia, this is a new issue, started a few months ago, has difficulty falling and staying asleep. She thinks the factors causing insomnia are mainly worries and headaches. Has not taken any particular medication. Ongoing headache, some depression, saw neurology, was a started on sertraline w/ good improvement of depression however headache continue. Has pain mostly at the wrists and fingers, very active w/ her hands. Denies any redness, swelling or warmness at the area.    Review of Systems Occasional cough, at baseline. Has a history of  Lichen, rash is not getting better, sees dermatology. Constipation is not an issue anymore   Other than above, a 14 point review of systems is negative      Past Medical History:  Diagnosis Date  . Allergy   . Anxiety   . Arthritis   . Blood transfusion without reported diagnosis   . Constipation, chronic   . DEGENERATIVE JOINT DISEASE 06/02/2010  . Depression   . Glaucoma 2014   Right eye  . Headache(784.0)   . Hyperlipidemia    hx of  . Osteopenia   . Psoriasis   . PUD (peptic ulcer disease) 2012   Upper GI bleed d/t NSAIDs  . RLS (restless legs syndrome)    robaxin prn    Past Surgical History:  Procedure Laterality Date  . ANKLE FRACTURE SURGERY Left    surgery 12-11 (LEFT)  . [place]    Social History   Social History  . Marital status: Married    Spouse name: N/A  . Number of children: 1  . Years of education: N/A   Occupational History  . 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 Smoking status: Never Smoker  . Smokeless tobacco: Never Used  . Alcohol use Yes     Comment: socially  . Drug use: No  . Sexual activity: Not on file    Other Topics Concern  . Not on file   Social History Narrative   2 step children, 1 child , 7 [place]   Household- patient and her husband, he recently stop driving       Family History  Problem Relation Age of Onset  . Aneurysm Mother        brain  . Celiac disease Brother   . Diabetes Maternal Grandfather   . Hypertension Neg Hx   . Breast cancer Neg Hx   . Coronary artery disease Neg Hx   . Colon cancer Neg Hx   . Esophageal cancer Neg Hx   . Stomach cancer Neg Hx   . Rectal cancer Neg Hx      Allergies as of 11/22/2016      Reactions   Motrin [ibuprofen]    GI bleed   Pollen Extract    Statins    "delibitating pain all over body"   Sulfonamide Derivatives    Pt unsure of reaction   Ciprofloxacin Swelling, Rash      Medication List       Accurate as of 11/22/16  6:16 PM. Always use your most recent med list.          acetaminophen 325 MG tablet Commonly known as:  TYLENOL Take 650 mg by mouth as needed.   azelastine 0.1 % nasal spray Commonly known as:  ASTELIN Place 2 sprays into both nostrils at bedtime.   B COMPLEX VITAMINS PO Take by mouth daily.   buPROPion 300 MG 24 hr tablet Commonly known as:  WELLBUTRIN XL Take 1 tablet (300 mg total) by mouth daily.   calcipotriene 0.005 % cream Commonly known as:  DOVONOX   cetirizine 10 MG tablet Commonly known as:  ZYRTEC Take 10 mg by mouth daily.   clobetasol 0.05 % external solution Commonly known as:  TEMOVATE Apply 1 application topically 2 (two) times daily as needed.   Fish Oil 1000 MG Cpdr Take by mouth.   guaiFENesin 600 MG 12 hr tablet Commonly known as:  MUCINEX Take by mouth as needed.   hydrocortisone 2.5 % cream Apply topically 2 (two) times daily.   ketoconazole 2 % cream Commonly known as:  NIZORAL Apply 1 application topically daily.   methocarbamol 500 MG tablet Commonly known as:  ROBAXIN take 1 tablet by mouth three times a day if needed for RLS   omeprazole 20 MG  capsule Commonly known as:  PRILOSEC Take 1 capsule (20 mg total) by mouth daily.   polyethylene glycol packet Commonly known as:  MIRALAX / GLYCOLAX Take 17 g by mouth daily as needed.   sertraline 100 MG tablet Commonly known as:  ZOLOFT Take 1 tablet (100 mg total) by mouth daily.   SUMAtriptan 100 MG tablet Commonly known as:  IMITREX TAKE 1 TABLET BY MOUTH AT EARLIEST ONSET OF HEADACHE. MAY REPEAT ONCE IN 2 HOURS IF HEADACHE PERSISTS OR RECURS.   triamcinolone cream 0.1 % Commonly known as:  KENALOG Apply 1 application topically as needed.   VITAMIN D-400 PO Take by mouth.   zolpidem 5 MG tablet Commonly known as:  AMBIEN Take 1 tablet (5 mg total) by mouth at bedtime as needed for sleep.   Zoster Vac Recomb Adjuvanted injection Commonly known as:  SHINGRIX Inject 0.5 mLs into the muscle once.          Objective:   Physical Exam BP 116/78 (BP Location: Left Arm, Patient Position: Sitting, Cuff Size: Small)   Pulse 73   Temp 98.3 F (36.8 C) (Oral)   Resp 14   Ht 5\' 5"  (1.651 m)   Wt 166 lb 2 oz (75.4 kg)   SpO2 97%   BMI 27.64 kg/m   General:   Well developed, well nourished . NAD.  Neck: No  thyromegaly  HEENT:  Normocephalic . Face symmetric, atraumatic Lungs:  CTA B Normal respiratory effort, no intercostal retractions, no accessory muscle use. Heart: RRR,  no murmur.  No pretibial edema bilaterally  Abdomen:  Not distended, soft, non-tender. No rebound or rigidity.   Skin:   Not pale. Not jaundice MSK: Hands and wrists with deformities consistent with DJD but without synovitis. Neurologic:  alert & oriented X3.  Speech normal, gait appropriate for age and unassisted Strength symmetric and appropriate for age.  Psych: Cognition and judgment appear intact.  Cooperative with normal attention span and concentration.  Behavior appropriate. No anxious or depressed appearing.    Assessment & Plan:   Assessment Hyperlipidemia: Lipitor causes  myalgias, declined it other statins, zetia didn't help Anxiety depression insomnia DJD Headaches Osteopenia: 07-2014 T score of -1.7, Rx calcium and vitamin D PUD due to NSAIDs RLS - robaxin  prn Glaucoma Chronic constipation Dx LSC (lichen )  ~ 5726 ,  sees derm    PLAN: Hyperlipidemia: Currently on diet only, check labs Anxiety, depression, insomnia: On Wellbutrin and recently started Zoloft 10/2016 per neuro. Depression seems to be okay, insomnia started  few months ago mostly because she keeps thinking about her worries at night. Recommend to increase sertraline to 100 mg (hopefully will decrease intrusive thoughts at night), and rx  Ambien 5 mg. Reassess in 2 months. DJD: Pain at the hands likely a DJD, recommend Tylenol Headaches: Ongoing, last sedimentation rate normal, sees neurology regularly, sumatriptan helps but she still has frequent headaches. I am increasing the sertraline dose and starting Ambien, that may actually help the headaches. Recommend ER if the headaches are severe. RTC 2 months

## 2016-11-22 NOTE — Progress Notes (Signed)
Pre visit review using our clinic review tool, if applicable. No additional management support is needed unless otherwise documented below in the visit note. 

## 2016-11-23 LAB — CBC WITH DIFFERENTIAL/PLATELET
BASOS PCT: 0.2 % (ref 0.0–3.0)
Basophils Absolute: 0 10*3/uL (ref 0.0–0.1)
EOS PCT: 5.9 % — AB (ref 0.0–5.0)
Eosinophils Absolute: 0.4 10*3/uL (ref 0.0–0.7)
HCT: 37.4 % (ref 36.0–46.0)
Hemoglobin: 12.8 g/dL (ref 12.0–15.0)
LYMPHS ABS: 1.5 10*3/uL (ref 0.7–4.0)
Lymphocytes Relative: 22 % (ref 12.0–46.0)
MCHC: 34.1 g/dL (ref 30.0–36.0)
MCV: 92.8 fl (ref 78.0–100.0)
MONO ABS: 0.8 10*3/uL (ref 0.1–1.0)
Monocytes Relative: 11.6 % (ref 3.0–12.0)
NEUTROS PCT: 60.3 % (ref 43.0–77.0)
Neutro Abs: 4 10*3/uL (ref 1.4–7.7)
PLATELETS: 338 10*3/uL (ref 150.0–400.0)
RBC: 4.03 Mil/uL (ref 3.87–5.11)
RDW: 13.3 % (ref 11.5–15.5)
WBC: 6.7 10*3/uL (ref 4.0–10.5)

## 2016-11-23 LAB — LIPID PANEL
CHOLESTEROL: 272 mg/dL — AB (ref 0–200)
HDL: 69.3 mg/dL (ref >39.00)
LDL CALC: 172 mg/dL — AB (ref 0–99)
NonHDL: 203.04
Total CHOL/HDL Ratio: 4
Triglycerides: 157 mg/dL — ABNORMAL HIGH (ref 0.0–149.0)
VLDL: 31.4 mg/dL (ref 0.0–40.0)

## 2016-11-23 LAB — COMPREHENSIVE METABOLIC PANEL
ALK PHOS: 99 U/L (ref 39–117)
ALT: 18 U/L (ref 0–35)
AST: 22 U/L (ref 0–37)
Albumin: 4.3 g/dL (ref 3.5–5.2)
BUN: 12 mg/dL (ref 6–23)
CHLORIDE: 101 meq/L (ref 96–112)
CO2: 27 mEq/L (ref 19–32)
Calcium: 9.6 mg/dL (ref 8.4–10.5)
Creatinine, Ser: 0.78 mg/dL (ref 0.40–1.20)
GFR: 77.07 mL/min (ref >60.00)
GLUCOSE: 82 mg/dL (ref 70–99)
POTASSIUM: 4 meq/L (ref 3.5–5.1)
Sodium: 137 mEq/L (ref 135–145)
TOTAL PROTEIN: 7.1 g/dL (ref 6.0–8.3)
Total Bilirubin: 0.5 mg/dL (ref 0.2–1.2)

## 2016-11-23 LAB — TSH: TSH: 1.51 u[IU]/mL (ref 0.35–4.50)

## 2016-11-23 LAB — HEPATITIS C ANTIBODY: HCV Ab: NEGATIVE

## 2016-12-08 ENCOUNTER — Other Ambulatory Visit: Payer: Self-pay | Admitting: Neurology

## 2016-12-19 ENCOUNTER — Telehealth: Payer: Self-pay | Admitting: Internal Medicine

## 2016-12-19 NOTE — Telephone Encounter (Signed)
[facility] - Client [facility]  Patient Name: Cynthia [name]  DOB: 07/15/1945    Initial Comment Caller states she has a painful skin tag under her arm   Nurse Assessment  Nurse: Harlow Mares, RN, Suanne Marker Date/Time (Eastern Time): 12/19/2016 10:34:27 AM  Confirm and document reason for call. If symptomatic, describe symptoms. ---Caller states she has a painful skin tag under her arm.  Does the patient have any new or worsening symptoms? ---Yes  Will a triage be completed? ---Yes  Related visit to physician within the last 2 weeks? ---N/A  Does the PT have any chronic conditions? (i.e. diabetes, asthma, etc.) ---Yes  List chronic conditions. ---Lichen's Symplex Chronicus.  Is the patient pregnant or possibly pregnant? (Ask all females between the ages of 42-55) ---No  Is this a behavioral health or substance abuse call? ---No     Guidelines    Guideline Title Affirmed Question Affirmed Notes  Skin - Lump Or Localized Swelling [1] Small swelling or lump AND [2] unexplained AND [3] persists > 1 week    Final Disposition User   See PCP When Office is Open (within 3 days) Harlow Mares, RN, Suanne Marker    Comments  Appt scheduled with Dr. Larose Kells for Thursday 12/22/15 @ 8:45 am at the [facility] office.   Referrals  REFERRED TO PCP OFFICE   Disagree/Comply: Comply

## 2016-12-19 NOTE — Telephone Encounter (Signed)
Follow up call made to patient. Left message for return call regarding skin tag.

## 2016-12-21 ENCOUNTER — Encounter: Payer: Self-pay | Admitting: Internal Medicine

## 2016-12-21 ENCOUNTER — Ambulatory Visit (INDEPENDENT_AMBULATORY_CARE_PROVIDER_SITE_OTHER): Payer: Medicare Other | Admitting: Internal Medicine

## 2016-12-21 VITALS — BP 118/80 | HR 82 | Temp 98.4°F | Resp 14 | Ht 65.0 in | Wt 164.5 lb

## 2016-12-21 DIAGNOSIS — L918 Other hypertrophic disorders of the skin: Secondary | ICD-10-CM

## 2016-12-21 NOTE — Progress Notes (Signed)
Pre visit review using our clinic review tool, if applicable. No additional management support is needed unless otherwise documented below in the visit note. 

## 2016-12-21 NOTE — Progress Notes (Signed)
Subjective:    Patient ID: Cynthia [name], female    DOB: February 19, 1946, 71 y.o.   MRN: [id number]  DOS:  12/21/2016 Type of visit - description : acute Interval history: Has a number of skin tags at the axillary area. She tried  to remove one from the R armpit ~ 4 days ago using a dental floss string. The area is now irritated and more painful.    Review of Systems  Denies fever, chills or discharge  Past Medical History:  Diagnosis Date  . Allergy   . Anxiety   . Arthritis   . Blood transfusion without reported diagnosis   . Constipation, chronic   . DEGENERATIVE JOINT DISEASE 06/02/2010  . Depression   . Glaucoma 2014   Right eye  . Headache(784.0)   . Hyperlipidemia    hx of  . Osteopenia   . Psoriasis   . PUD (peptic ulcer disease) 2012   Upper GI bleed d/t NSAIDs  . RLS (restless legs syndrome)    robaxin prn    Past Surgical History:  Procedure Laterality Date  . ANKLE FRACTURE SURGERY Left    surgery 12-11 (LEFT)  . [place]sie    Social History   Social History  . Marital status: Married    Spouse name: N/A  . Number of children: 1  . Years of education: N/A   Occupational History  . 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 Smoking status: Never Smoker  . Smokeless tobacco: Never Used  . Alcohol use Yes     Comment: socially  . Drug use: No  . Sexual activity: Not on file   Other Topics Concern  . Not on file   Social History Narrative   2 step children, 1 child , [place]- patient and her husband, he recently stop driving        Allergies as of 12/21/2016      Reactions   Motrin [ibuprofen]    GI bleed   Pollen Extract    Statins    "delibitating pain all over body"   Sulfonamide Derivatives    Pt unsure of reaction   Ciprofloxacin Swelling, Rash      Medication List       Accurate as of 12/21/16  9:00 AM. Always use your most recent med list.          acetaminophen 325 MG  tablet Commonly known as:  TYLENOL Take 650 mg by mouth as needed.   azelastine 0.1 % nasal spray Commonly known as:  ASTELIN Place 2 sprays into both nostrils at bedtime.   B COMPLEX VITAMINS PO Take by mouth daily.   buPROPion 300 MG 24 hr tablet Commonly known as:  WELLBUTRIN XL Take 1 tablet (300 mg total) by mouth daily.   calcipotriene 0.005 % cream Commonly known as:  DOVONOX   cetirizine 10 MG tablet Commonly known as:  ZYRTEC Take 10 mg by mouth daily.   clobetasol 0.05 % external solution Commonly known as:  TEMOVATE Apply 1 application topically 2 (two) times daily as needed.   Fish Oil 1000 MG Cpdr Take by mouth.   guaiFENesin 600 MG 12 hr tablet Commonly known as:  MUCINEX Take by mouth as needed.   hydrocortisone 2.5 % cream Apply topically 2 (two) times daily.   ketoconazole 2 % cream Commonly known as:  NIZORAL Apply 1 application topically daily.  methocarbamol 500 MG tablet Commonly known as:  ROBAXIN take 1 tablet by mouth three times a day if needed for RLS   omeprazole 20 MG capsule Commonly known as:  PRILOSEC Take 1 capsule (20 mg total) by mouth daily.   polyethylene glycol packet Commonly known as:  MIRALAX / GLYCOLAX Take 17 g by mouth daily as needed.   sertraline 100 MG tablet Commonly known as:  ZOLOFT Take 1 tablet (100 mg total) by mouth daily.   SUMAtriptan 100 MG tablet Commonly known as:  IMITREX TAKE 1 TABLET BY MOUTH AT EARLIEST ONSET OF HEADACHE. MAY REPEAT ONCE IN 2 HOURS IF HEADACHE PERSISTS OR RECURS.   triamcinolone cream 0.1 % Commonly known as:  KENALOG Apply 1 application topically as needed.   VITAMIN D-400 PO Take by mouth.   zolpidem 5 MG tablet Commonly known as:  AMBIEN Take 1 tablet (5 mg total) by mouth at bedtime as needed for sleep.          Objective:   Physical Exam BP 118/80 (BP Location: Left Arm, Patient Position: Sitting, Cuff Size: Small)   Pulse 82   Temp 98.4 F (36.9 C)  (Oral)   Resp 14   Ht 5\' 5"  (1.651 m)   Wt 164 lb 8 oz (74.6 kg)   SpO2 98%   BMI 27.37 kg/m  General:   Well developed, well nourished . NAD.  HEENT:  Normocephalic . Face symmetric, atraumatic Skin:  Right arm pit: Has a 2-3 mm skin tag, black in color with a dental floss at the base Left arm multiple skin tags, the larger one bothers the patient. Neurologic:  alert & oriented X3.  Speech normal, gait appropriate for age and unassisted Psych--  Cognition and judgment appear intact.  Cooperative with normal attention span and concentration.  Behavior appropriate. No anxious or depressed appearing.   RIGHT    LEFT          Assessment & Plan:   Assessment Hyperlipidemia: Lipitor causes myalgias, declined it other statins, zetia didn't help Anxiety depression insomnia DJD Headaches Osteopenia: 07-2014 T score of -1.7, Rx calcium and vitamin D PUD due to NSAIDs RLS - robaxin  prn Glaucoma Chronic constipation Dx LSC (lichen )  ~ 4128 , sees derm    PLAN: Skin tags: Right arm pit: Remove it with scissors, area cauterized. Will send to pathology since I have not seen this lesions in his natural state. Left armpit: The large skin tag looks very classic, removed with scissors & cauterized, no pathology sent. Patient tolerated well except for the pain from cauterizing

## 2016-12-21 NOTE — Patient Instructions (Signed)
Keep the area clean and dry  Call if redness, swelling, discharge, fever chills.

## 2016-12-22 NOTE — Assessment & Plan Note (Signed)
Skin tags: Right arm pit: Remove it with scissors, area cauterized. Will send to pathology since I have not seen this lesions in his natural state. Left armpit: The large skin tag looks very classic, removed with scissors & cauterized, no pathology sent. Patient tolerated well except for the pain from cauterizing

## 2017-01-24 ENCOUNTER — Ambulatory Visit (INDEPENDENT_AMBULATORY_CARE_PROVIDER_SITE_OTHER): Payer: Medicare Other | Admitting: Internal Medicine

## 2017-01-24 ENCOUNTER — Encounter: Payer: Self-pay | Admitting: Internal Medicine

## 2017-01-24 VITALS — BP 118/68 | HR 69 | Temp 97.8°F | Resp 14 | Ht 65.0 in | Wt 164.2 lb

## 2017-01-24 DIAGNOSIS — F329 Major depressive disorder, single episode, unspecified: Secondary | ICD-10-CM | POA: Diagnosis not present

## 2017-01-24 DIAGNOSIS — G43809 Other migraine, not intractable, without status migrainosus: Secondary | ICD-10-CM | POA: Diagnosis not present

## 2017-01-24 DIAGNOSIS — M1990 Unspecified osteoarthritis, unspecified site: Secondary | ICD-10-CM | POA: Diagnosis not present

## 2017-01-24 DIAGNOSIS — F419 Anxiety disorder, unspecified: Secondary | ICD-10-CM

## 2017-01-24 MED ORDER — TOPIRAMATE 25 MG PO TABS: 25.0000 mg | ORAL_TABLET | Freq: Two times a day (BID) | ORAL | 1 refills | Status: DC

## 2017-01-24 NOTE — Progress Notes (Signed)
Subjective:    Patient ID: Cynthia [name], female    DOB: May 12, 1946, 71 y.o.   MRN: [id number]  DOS:  01/24/2017 Type of visit - description : rov Interval history: Anxiety depression: Few weeks ago we increase sertraline and started Ambien, symptoms are definitely better control. Still has some issues with sleeping. Admits to use her computer until late at night. DJD: Continue with aches, most of the hands and wrists, no redness or swelling noted. She still trying to be active. Headaches: About the same, still needs Imitrex several times a week.   Review of Systems   Past Medical History:  Diagnosis Date  . Allergy   . Anxiety   . Arthritis   . Blood transfusion without reported diagnosis   . Constipation, chronic   . DEGENERATIVE JOINT DISEASE 06/02/2010  . Depression   . Glaucoma 2014   Right eye  . Headache(784.0)   . Hyperlipidemia    hx of  . Osteopenia   . Psoriasis   . PUD (peptic ulcer disease) 2012   Upper GI bleed d/t NSAIDs  . RLS (restless legs syndrome)    robaxin prn    Past Surgical History:  Procedure Laterality Date  . ANKLE FRACTURE SURGERY Left    surgery 12-11 (LEFT)  . [place]    Social History   Social History  . Marital status: Married    Spouse name: N/A  . Number of children: 1  . Years of education: N/A   Occupational History  . 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 Smoking status: Never Smoker  . Smokeless tobacco: Never Used  . Alcohol use Yes     Comment: socially  . Drug use: No  . Sexual activity: Not on file   Other Topics Concern  . Not on file   Social History Narrative   2 step children, 1 child , [place]- patient and her husband, he recently stop driving        Allergies as of 01/24/2017      Reactions   Motrin [ibuprofen]    GI bleed   Pollen Extract    Statins    "delibitating pain all over body"   Sulfonamide Derivatives    Pt unsure of reaction    Ciprofloxacin Swelling, Rash      Medication List       Accurate as of 01/24/17 11:59 PM. Always use your most recent med list.          acetaminophen 325 MG tablet Commonly known as:  TYLENOL Take 650 mg by mouth as needed.   azelastine 0.1 % nasal spray Commonly known as:  ASTELIN Place 2 sprays into both nostrils at bedtime.   B COMPLEX VITAMINS PO Take by mouth daily.   buPROPion 300 MG 24 hr tablet Commonly known as:  WELLBUTRIN XL Take 1 tablet (300 mg total) by mouth daily.   calcipotriene 0.005 % cream Commonly known as:  DOVONOX   cetirizine 10 MG tablet Commonly known as:  ZYRTEC Take 10 mg by mouth daily.   clobetasol 0.05 % external solution Commonly known as:  TEMOVATE Apply 1 application topically 2 (two) times daily as needed.   Fish Oil 1000 MG Cpdr Take by mouth.   guaiFENesin 600 MG 12 hr tablet Commonly known as:  MUCINEX Take by mouth as needed.   hydrocortisone 2.5 % cream Apply topically 2 (two) times  daily.   ketoconazole 2 % cream Commonly known as:  NIZORAL Apply 1 application topically daily.   methocarbamol 500 MG tablet Commonly known as:  ROBAXIN take 1 tablet by mouth three times a day if needed for RLS   omeprazole 20 MG capsule Commonly known as:  PRILOSEC Take 1 capsule (20 mg total) by mouth daily.   polyethylene glycol packet Commonly known as:  MIRALAX / GLYCOLAX Take 17 g by mouth daily as needed.   sertraline 100 MG tablet Commonly known as:  ZOLOFT Take 1 tablet (100 mg total) by mouth daily.   SUMAtriptan 100 MG tablet Commonly known as:  IMITREX TAKE 1 TABLET BY MOUTH AT EARLIEST ONSET OF HEADACHE. MAY REPEAT ONCE IN 2 HOURS IF HEADACHE PERSISTS OR RECURS.   topiramate 25 MG tablet Commonly known as:  TOPAMAX Take 1 tablet (25 mg total) by mouth 2 (two) times daily.   triamcinolone cream 0.1 % Commonly known as:  KENALOG Apply 1 application topically as needed.   VITAMIN D-400 PO Take by  mouth.   zolpidem 5 MG tablet Commonly known as:  AMBIEN Take 1 tablet (5 mg total) by mouth at bedtime as needed for sleep.            Discharge Care Instructions        Start     Ordered   01/24/17 0000  topiramate (TOPAMAX) 25 MG tablet  2 times daily     01/24/17 0908         Objective:   Physical Exam BP 118/68 (BP Location: Left Arm, Patient Position: Sitting, Cuff Size: Small)   Pulse 69   Temp 97.8 F (36.6 C) (Oral)   Resp 14   Ht 5\' 5"  (1.651 m)   Wt 164 lb 4 oz (74.5 kg)   SpO2 97%   BMI 27.33 kg/m  General:   Well developed, well nourished . NAD.  HEENT:  Normocephalic . Face symmetric, atraumatic MSK: Hands and wrists without swelling or redness, some bony changes consistent with DJD Skin: Not pale. Not jaundice Neurologic:  alert & oriented X3.  Speech normal, gait appropriate for age and unassisted Psych--  Cognition and judgment appear intact.  Cooperative with normal attention span and concentration.  Behavior appropriate. No anxious or depressed appearing.      Assessment & Plan:  Assessment Hyperlipidemia: Lipitor causes myalgias, declined it other statins, zetia didn't help Anxiety depression insomnia DJD Headaches Osteopenia: 07-2014 T score of -1.7, Rx calcium and vitamin D PUD due to NSAIDs RLS - robaxin  prn Glaucoma Chronic constipation Dx LSC (lichen )  ~ 9163 , sees derm    PLAN: Anxiety depression insomnia: Currently well Wellbutrin, Zoloft dose increased at last OV, depression anxiety definitely better. Still have some issues with insomnia. We agreed to continue with Ambien but be much more careful with sleep hygiene, specifically recommend to avoid the screens few hours before going to bed. DJD: Mostly has and wrists, no evidence of synovitis. Headaches: Still frequent headaches despite better treatment for anxiety depression and insomnia. On Imitrex. Rec a trial with Topamax, patient will think about it, rx issued in  case she likes to try. She has an appointment to see neurology in few weeks. RTC 6 months

## 2017-01-24 NOTE — Patient Instructions (Addendum)
GO TO THE FRONT DESK Schedule your next appointment for a  Check up in 6 months    Consider topamax: 1 tab at night x 10 days then 1 tab twice a day

## 2017-01-24 NOTE — Progress Notes (Signed)
Pre visit review using our clinic review tool, if applicable. No additional management support is needed unless otherwise documented below in the visit note. 

## 2017-01-25 NOTE — Assessment & Plan Note (Signed)
Anxiety depression insomnia: Currently well Wellbutrin, Zoloft dose increased at last OV, depression anxiety definitely better. Still have some issues with insomnia. We agreed to continue with Ambien but be much more careful with sleep hygiene, specifically recommend to avoid the screens few hours before going to bed. DJD: Mostly has and wrists, no evidence of synovitis. Headaches: Still frequent headaches despite better treatment for anxiety depression and insomnia. On Imitrex. Rec a trial with Topamax, patient will think about it, rx issued in case she likes to try. She has an appointment to see neurology in few weeks. RTC 6 months

## 2017-02-01 ENCOUNTER — Other Ambulatory Visit: Payer: Self-pay | Admitting: Internal Medicine

## 2017-02-02 NOTE — Telephone Encounter (Signed)
Rx faxed to [facility] pharmacy.  

## 2017-02-02 NOTE — Telephone Encounter (Signed)
Pt is requesting refill on Ambien 5mg .  Last OV: 01/24/2017  Last Fill: 11/22/2016 #30 and 1RF UDS: Not needed for Ambien per PCP  NCCR printed; no issues noted  Please advise.

## 2017-02-02 NOTE — Telephone Encounter (Signed)
Okay 30 and 3 refills 

## 2017-02-02 NOTE — Telephone Encounter (Signed)
Rx printed, awaiting MD signature.  

## 2017-02-15 ENCOUNTER — Encounter: Payer: Self-pay | Admitting: Internal Medicine

## 2017-02-15 DIAGNOSIS — Z01419 Encounter for gynecological examination (general) (routine) without abnormal findings: Secondary | ICD-10-CM

## 2017-02-15 NOTE — Telephone Encounter (Signed)
Referral placed.

## 2017-02-15 NOTE — Telephone Encounter (Signed)
See message, enter a gyn referral  Received: Today  Message Contents  Colon Branch, MD  Damita Dunnings, [facility]

## 2017-02-21 ENCOUNTER — Other Ambulatory Visit: Payer: Self-pay | Admitting: Internal Medicine

## 2017-02-26 ENCOUNTER — Other Ambulatory Visit: Payer: Self-pay | Admitting: Internal Medicine

## 2017-03-08 ENCOUNTER — Encounter: Payer: Self-pay | Admitting: Internal Medicine

## 2017-03-16 ENCOUNTER — Other Ambulatory Visit: Payer: Self-pay | Admitting: Neurology

## 2017-04-02 ENCOUNTER — Ambulatory Visit (INDEPENDENT_AMBULATORY_CARE_PROVIDER_SITE_OTHER): Payer: Medicare Other | Admitting: Obstetrics & Gynecology

## 2017-04-02 ENCOUNTER — Encounter: Payer: Self-pay | Admitting: Obstetrics & Gynecology

## 2017-04-02 VITALS — BP 124/78 | HR 73 | Ht 65.0 in | Wt 163.0 lb

## 2017-04-02 DIAGNOSIS — Z01419 Encounter for gynecological examination (general) (routine) without abnormal findings: Secondary | ICD-10-CM | POA: Diagnosis not present

## 2017-04-02 DIAGNOSIS — N949 Unspecified condition associated with female genital organs and menstrual cycle: Secondary | ICD-10-CM

## 2017-04-02 LAB — POCT URINALYSIS DIPSTICK
Bilirubin, UA: NEGATIVE
Glucose, UA: NEGATIVE
KETONES UA: NEGATIVE
Leukocytes, UA: NEGATIVE
Nitrite, UA: NEGATIVE
PH UA: 7 (ref 5.0–8.0)
PROTEIN UA: NEGATIVE
RBC UA: NEGATIVE
UROBILINOGEN UA: 0.2 U/dL

## 2017-04-02 NOTE — Progress Notes (Signed)
Subjective:     Cynthia [name] is a 71 y.o. female here for a routine exam.  Current complaints: pt reports aching in a spot over her pubic bone. It has occurred for several months, She reports that this hurts sometimes after voiding or passing a stool and sometimes with just sitting. Pt had her urine checked and irt was neg. She notes it in bed. Not with movement, But, when lying supine. She does have sciatica.  Pt feels that she lifts too much. She assists with her husband is 62 years. Old. Sh eocc has to assist him with lifting. She does fo to a Restaurant manager, fast food.     Pt denies bleeding or discharge. Pt is not sexually x 20 years.    Pt deneis dysuria and does not feel that this is a UTI. Her urine is clear.   She denies discharge or bleedling   Gynecologic History No LMP recorded. Patient is postmenopausal. Contraception: post menopausal status Last Pap: 11/09/2009. Results were: normal Last mammogram: 07/27/2016. Results were: normal  Obstetric History OB History  No data available   The following portions of the patient's history were reviewed and updated as appropriate: allergies, current medications, past family history, past medical history, past social history, past surgical history and problem list.  Review of Systems Pertinent items are noted in HPI.    Objective:  BP 124/78   Pulse 73   Ht 5\' 5"  (1.651 m)   Wt 163 lb (73.9 kg)   BMI 27.12 kg/m   Pt in NAD Lungs: CTA CV: RRR Abd; well healed vertical incision.    Pubic symphysis- tenderness directly over pubic symphysis GU: EGBUS: no lesions Vagina: no blood in vault Cervix: no lesion; no mucopurulent d/c Uterus: small, mobile Adnexa: no masses; non tender   UA: WNL    Assessment:   Pubic symphysis inflammation- suspect due to over use or trauma GYN exam- WNL   Plan:   Pt unable to take NSAIDS F/u in 3 months Heating pad over pubic symphysis- reviewed rice socks.   Total face-to-face time with patient was  25 min.  Greater than 50% was spent in counseling and coordination of care with the patient.   Mitchelle Sultan L. Harraway-Gibbs, M.D., Cherlynn June

## 2017-04-10 ENCOUNTER — Other Ambulatory Visit: Payer: Self-pay | Admitting: Internal Medicine

## 2017-04-30 ENCOUNTER — Encounter: Payer: Self-pay | Admitting: Neurology

## 2017-04-30 ENCOUNTER — Ambulatory Visit: Payer: Medicare Other | Admitting: Neurology

## 2017-04-30 VITALS — BP 124/86 | HR 80 | Ht 65.0 in | Wt 151.0 lb

## 2017-04-30 DIAGNOSIS — G43809 Other migraine, not intractable, without status migrainosus: Secondary | ICD-10-CM

## 2017-04-30 NOTE — Patient Instructions (Signed)
1.  Start the topiramate.  Take 1 tablet at bedtime for a week, then increase to 1 tablet twice daily.  Rarely, it may cause glaucoma, so if your vision is getting worse, see your eye doctor.  If your glaucoma looks worse, we will need to stop it. 2.  Use the sumatriptan as needed 3.  Try using Robaxin at bedtime to help with neck pain 4.  Use a non-contoured memory foam pillow 5.  Follow up in 3 months.

## 2017-04-30 NOTE — Progress Notes (Signed)
NEUROLOGY FOLLOW UP OFFICE NOTE  Cynthia [name] [id number]  HISTORY OF PRESENT ILLNESS: Cynthia [name] is a 71 year old right-handed female with depression, glaucoma and degenerative joint disease who follows up for chronic migraine   UPDATE: Instead of restarting nortriptyline, in June we started sertraline to treat the headache and also better treat her depression.  There hasn't been much improvement.  Dr. Larose Kells prescribed topiramate but she hasn't started it. Intensity:  7-8/10; June: 7-8/10 Duration:  20 minutes with sumatriptan; June: 20 minutes with sumatriptan Frequency:  15 headache days per month. Triggers:  uncertain.   Current NSAIDS:  No (contraindicated due to GI bleed) Current analgesics:  no Current triptans:  sumatriptan 100mg  (takes 1/2 tablet) Current anti-emetic:  no Current muscle relaxant:  Robaxin (once in awhile) Current Antihypertensive medications:  no Current Antidepressant medications:  sertraline 50mg , bupropion Current Anticonvulsant medications:  no Current Vitamins/Herbal/Supplements:  B complex, Mg Current Antihistamines/Decongestants:  Mucinex, Astelin Other therapy:  no   Diet: Increased water, stopped soda. Alcohol: no Smoking:  no Exercise:  yes Depression/anxiety: increased   HISTORY: She started having headaches once in a while around 2015, but have progressively become more frequent in 2016.  They are usually left sided, involving the ear, but also may be right sided (involving the sinuses) or in band-like distribution.  It is both throbbing and non-throbbing.  Initially, it is usually 5/10 but severe episodes are 7-8/10.  Sometimes there is nausea.  There is phonophobia.  There is no photophobia or visual disturbance.  Initially, it lasts a couple of hours and occuring 15 days out of the month (3 to 4 days severe).  She reports sensation of congestion but no runny nose.  CT of sinuses showed opacified right sphenoid sinus and ethmoid air cell.   She was treated a couple of times with Augmentin and prednisone, which was effective for a while but headaches then returned.  She can't think of a trigger.  MRI of brain from 06/10/15 was normal.  Sed Rate from 12/27/15 was 10.     Past medication:  Tylenol, nortriptyline 50mg   PAST MEDICAL HISTORY: Past Medical History:  Diagnosis Date  . Allergy   . Anxiety   . Arthritis   . Blood transfusion without reported diagnosis   . Constipation, chronic   . DEGENERATIVE JOINT DISEASE 06/02/2010  . Depression   . Glaucoma 2014   Right eye  . Headache(784.0)   . Hyperlipidemia    hx of  . Osteopenia   . Psoriasis   . PUD (peptic ulcer disease) 2012   Upper GI bleed d/t NSAIDs  . RLS (restless legs syndrome)    robaxin prn    MEDICATIONS: Current Outpatient Medications on File Prior to Visit  Medication Sig Dispense Refill  . acetaminophen (TYLENOL) 325 MG tablet Take 650 mg by mouth as needed.    Marland Kitchen azelastine (ASTELIN) 0.1 % nasal spray Place 2 sprays into both nostrils at bedtime. (Patient taking differently: Place 2 sprays into both nostrils at bedtime as needed. ) 30 mL 6  . B COMPLEX VITAMINS PO Take by mouth daily.    Marland Kitchen buPROPion (WELLBUTRIN XL) 300 MG 24 hr tablet Take 1 tablet (300 mg total) by mouth daily. 90 tablet 1  . calcipotriene (DOVONOX) 0.005 % cream     . cetirizine (ZYRTEC) 10 MG tablet Take 10 mg by mouth daily.      . Cholecalciferol (VITAMIN D-400 PO) Take by mouth.      Marland Kitchen  clobetasol (TEMOVATE) 0.05 % external solution Apply 1 application topically 2 (two) times daily as needed.     Marland Kitchen guaiFENesin (MUCINEX) 600 MG 12 hr tablet Take by mouth as needed.    . hydrocortisone 2.5 % cream Apply topically 2 (two) times daily. 30 g 0  . ketoconazole (NIZORAL) 2 % cream Apply topically daily. 30 g 0  . methocarbamol (ROBAXIN) 500 MG tablet take 1 tablet by mouth three times a day if needed for RLS 90 tablet 3  . Omega-3 Fatty Acids (FISH OIL) 1000 MG CPDR Take by mouth.       Marland Kitchen omeprazole (PRILOSEC) 20 MG capsule Take 1 capsule (20 mg total) by mouth daily. 30 capsule 5  . polyethylene glycol (MIRALAX / GLYCOLAX) packet Take 17 g by mouth daily as needed.    . sertraline (ZOLOFT) 100 MG tablet Take 1 tablet (100 mg total) by mouth daily. 30 tablet 5  . SUMAtriptan (IMITREX) 100 MG tablet TAKE 1 TABLET BY MOUTH AT EARLIEST ONSET OF HEADACHE. MAY REPEAT ONCE IN 2 HOURS IF HEADACHE PERSISTS OR RECURS. 10 tablet 1  . topiramate (TOPAMAX) 25 MG tablet Take 1 tablet (25 mg total) by mouth 2 (two) times daily. (Patient not taking: Reported on 04/02/2017) 60 tablet 1  . triamcinolone cream (KENALOG) 0.1 % Apply 1 application topically as needed.     . zolpidem (AMBIEN) 5 MG tablet Take 1 tablet (5 mg total) by mouth at bedtime as needed for sleep. 30 tablet 3   No current facility-administered medications on file prior to visit.     ALLERGIES: Allergies  Allergen Reactions  . Motrin [Ibuprofen]     GI bleed  . Pollen Extract   . Statins     "delibitating pain all over body"  . Sulfonamide Derivatives     Pt unsure of reaction  . Ciprofloxacin Swelling and Rash    FAMILY HISTORY: Family History  Problem Relation Age of Onset  . Aneurysm Mother        brain  . Celiac disease Brother   . Diabetes Maternal Grandfather   . Hypertension Neg Hx   . Breast cancer Neg Hx   . Coronary artery disease Neg Hx   . Colon cancer Neg Hx   . Esophageal cancer Neg Hx   . Stomach cancer Neg Hx   . Rectal cancer Neg Hx     SOCIAL HISTORY: Social History   Socioeconomic History  . Marital status: Married    Spouse name: Not on file  . Number of children: 1  . Years of education: Not on file  . Highest education level: Not on file  Social Needs  . Financial resource strain: Not on file  . Food insecurity - worry: Not on file  . Food insecurity - inability: Not on file  . Transportation needs - medical: Not on file  . Transportation needs - non-medical: Not on  file  Occupational History  . Occupation: retired Environmental consultant: RETIRED  Tobacco Use  . Smoking status: Never Smoker  . Smokeless tobacco: Never Used  Substance and Sexual Activity  . Alcohol use: Yes    Comment: socially  . Drug use: No  . Sexual activity: Not on file  Other Topics Concern  . Not on file  Social History Narrative   2 step children, 1 child , [place]- patient and her husband, he recently stop driving  REVIEW OF SYSTEMS: Constitutional: No fevers, chills, or sweats, no generalized fatigue, change in appetite Eyes: No visual changes, double vision, eye pain Ear, nose and throat: No hearing loss, ear pain, nasal congestion, sore throat Cardiovascular: No chest pain, palpitations Respiratory:  No shortness of breath at rest or with exertion, wheezes GastrointestinaI: No nausea, vomiting, diarrhea, abdominal pain, fecal incontinence Genitourinary:  No dysuria, urinary retention or frequency Musculoskeletal:  No neck pain, back pain Integumentary: No rash, pruritus, skin lesions Neurological: as above Psychiatric: No depression, insomnia, anxiety Endocrine: No palpitations, fatigue, diaphoresis, mood swings, change in appetite, change in weight, increased thirst Hematologic/Lymphatic:  No purpura, petechiae. Allergic/Immunologic: no itchy/runny eyes, nasal congestion, recent allergic reactions, rashes  PHYSICAL EXAM: Vitals:   04/30/17 0852  BP: 124/86  Pulse: 80  SpO2: 98%   General: No acute distress.  Patient appears well-groomed.   Head:  Normocephalic/atraumatic Eyes:  Fundi examined but not visualized Neck: supple, no paraspinal tenderness, full range of motion Heart:  Regular rate and rhythm Lungs:  Clear to auscultation bilaterally Back: No paraspinal tenderness Neurological Exam: alert and oriented to person, place, and time. Attention span and concentration intact, recent and remote memory intact, fund of knowledge  intact.  Speech fluent and not dysarthric, language intact.  CN II-XII intact. Bulk and tone normal, muscle strength 5/5 throughout.  Sensation to light touch  intact.  Deep tendon reflexes 2+ throughout.  Finger to nose testing intact.  Gait normal, Romberg negative.  IMPRESSION: Cervicogenic migraine/chronic migraine  PLAN: 1.  She will try the topiramate 25mg .  She will ake 1 tablet at bedtime for a week, then increase to 1 tablet twice daily.  If her vision gets worse, advised to follow up with her eye doctor.  If glaucoma looks worse, we would have to discontinue it. 2.  Use the sumatriptan as needed 3.  Try using Robaxin at bedtime to help with neck pain 4.  Use a non-contoured memory foam pillow 5.  Follow up in 3 months.  Metta Clines, DO  CC:  Kathlene November, MD

## 2017-06-20 ENCOUNTER — Other Ambulatory Visit: Payer: Self-pay | Admitting: Neurology

## 2017-07-16 ENCOUNTER — Telehealth: Payer: Self-pay | Admitting: Internal Medicine

## 2017-07-16 NOTE — Telephone Encounter (Signed)
Pt is requesting refill on Ambien 5mg .   Last OV: 01/24/2017 Last Fill: 02/02/2017 #30 and 3rf UDS: Per PCP not needed for just Ambien  NCCR printed- no issues noted  Please advise.

## 2017-07-16 NOTE — Telephone Encounter (Signed)
Sent!

## 2017-07-24 ENCOUNTER — Ambulatory Visit: Payer: Medicare Other | Admitting: Internal Medicine

## 2017-07-24 ENCOUNTER — Encounter: Payer: Self-pay | Admitting: Internal Medicine

## 2017-07-24 VITALS — BP 122/70 | HR 71 | Temp 98.1°F | Resp 14 | Ht 65.0 in | Wt 161.2 lb

## 2017-07-24 DIAGNOSIS — G250 Essential tremor: Secondary | ICD-10-CM

## 2017-07-24 DIAGNOSIS — F329 Major depressive disorder, single episode, unspecified: Secondary | ICD-10-CM

## 2017-07-24 DIAGNOSIS — G43809 Other migraine, not intractable, without status migrainosus: Secondary | ICD-10-CM

## 2017-07-24 DIAGNOSIS — F419 Anxiety disorder, unspecified: Secondary | ICD-10-CM | POA: Diagnosis not present

## 2017-07-24 DIAGNOSIS — L280 Lichen simplex chronicus: Secondary | ICD-10-CM | POA: Insufficient documentation

## 2017-07-24 DIAGNOSIS — F32A Depression, unspecified: Secondary | ICD-10-CM

## 2017-07-24 NOTE — Patient Instructions (Signed)
  GO TO THE FRONT DESK Schedule your next appointment for a  Physical by 11-2017

## 2017-07-24 NOTE — Progress Notes (Signed)
Subjective:    Patient ID: Cynthia [name], female    DOB: 1946/01/05, 72 y.o.   MRN: [id number]  DOS:  07/24/2017 Type of visit - description : f/u Interval history: Headaches: Was prescribed Topamax, took it for a few weeks, realize it was not helping and she stopped it. RLS: Symptoms stable on Robaxin as needed Having suprapubic pain, status post gynecology eval. Tremors: Increasing lately, good days and bad days, only on her hands, occasionally has difficulty writing and feeding.  Review of Systems No chest pain or difficulty breathing  Past Medical History:  Diagnosis Date  . Allergy   . Anxiety   . Arthritis   . Blood transfusion without reported diagnosis   . Constipation, chronic   . DEGENERATIVE JOINT DISEASE 06/02/2010  . Depression   . Glaucoma 2014   Right eye  . Headache(784.0)   . Hyperlipidemia    hx of  . Osteopenia   . Psoriasis   . PUD (peptic ulcer disease) 2012   Upper GI bleed d/t NSAIDs  . RLS (restless legs syndrome)    robaxin prn    Past Surgical History:  Procedure Laterality Date  . ANKLE FRACTURE SURGERY Left    surgery 12-11 (LEFT)  . [place]    Social History   Socioeconomic History  . Marital status: Married    Spouse name: Not on file  . Number of children: 1  . Years of education: Not on file  . Highest education level: Not on file  Social Needs  . Financial resource strain: Not on file  . Food insecurity - worry: Not on file  . Food insecurity - inability: Not on file  . Transportation needs - medical: Not on file  . Transportation needs - non-medical: Not on file  Occupational History  . Occupation: retired Environmental consultant: RETIRED  Tobacco Use  . Smoking status: Never Smoker  . Smokeless tobacco: Never Used  Substance and Sexual Activity  . Alcohol use: Yes    Comment: socially  . Drug use: No  . Sexual activity: Not on file  Other Topics Concern  . Not on file  Social History  Narrative   2 step children, 1 child , [place]- patient and her husband, he recently stop driving        Allergies as of 07/24/2017      Reactions   Motrin [ibuprofen]    GI bleed   Pollen Extract    Statins    "delibitating pain all over body"   Sulfonamide Derivatives    Pt unsure of reaction   Ciprofloxacin Swelling, Rash      Medication List        Accurate as of 07/24/17 11:59 PM. Always use your most recent med list.          acetaminophen 325 MG tablet Commonly known as:  TYLENOL Take 650 mg by mouth as needed.   azelastine 0.1 % nasal spray Commonly known as:  ASTELIN Place 2 sprays into both nostrils at bedtime.   B COMPLEX VITAMINS PO Take by mouth daily.   BEPREVE 1.5 % Soln Generic drug:  Bepotastine Besilate Place 1 drop into both eyes 2 (two) times daily.   buPROPion 300 MG 24 hr tablet Commonly known as:  WELLBUTRIN XL Take 1 tablet (300 mg total) by mouth daily.   calcipotriene 0.005 % cream Commonly known as:  DOVONOX  cetirizine 10 MG tablet Commonly known as:  ZYRTEC Take 10 mg by mouth daily.   clobetasol 0.05 % external solution Commonly known as:  TEMOVATE Apply 1 application topically 2 (two) times daily as needed.   Fish Oil 1000 MG Cpdr Take by mouth.   guaiFENesin 600 MG 12 hr tablet Commonly known as:  MUCINEX Take by mouth as needed.   ketoconazole 2 % cream Commonly known as:  NIZORAL Apply topically daily.   methocarbamol 500 MG tablet Commonly known as:  ROBAXIN take 1 tablet by mouth three times a day if needed for RLS   omeprazole 20 MG capsule Commonly known as:  PRILOSEC Take 1 capsule (20 mg total) by mouth daily.   polyethylene glycol packet Commonly known as:  MIRALAX / GLYCOLAX Take 17 g by mouth daily as needed.   sertraline 100 MG tablet Commonly known as:  ZOLOFT Take 1 tablet (100 mg total) by mouth daily.   SUMAtriptan 100 MG tablet Commonly known as:  IMITREX TAKE 1 TABLET BY  MOUTH AT EARLIEST ONSET OF HEADACHE. MAY REPEAT ONCE IN 2 HOURS IF HEADACHE PERSISTS OR RECURS.   triamcinolone cream 0.1 % Commonly known as:  KENALOG Apply 1 application topically as needed.   VITAMIN D-400 PO Take by mouth.   zolpidem 5 MG tablet Commonly known as:  AMBIEN TAKE ONE TABLET BY MOUTH AT BEDTIME AS NEEDED FOR SLEEP          Objective:   Physical Exam BP 122/70 (BP Location: Left Arm, Patient Position: Sitting, Cuff Size: Small)   Pulse 71   Temp 98.1 F (36.7 C) (Oral)   Resp 14   Ht 5\' 5"  (1.651 m)   Wt 161 lb 4 oz (73.1 kg)   SpO2 96%   BMI 26.83 kg/m  General:   Well developed, well nourished . NAD.  HEENT:  Normocephalic . Face symmetric, atraumatic Lungs:  CTA B Normal respiratory effort, no intercostal retractions, no accessory muscle use. Heart: RRR,  no murmur.  No pretibial edema bilaterally  Skin: Not pale. Not jaundice Neurologic:  alert & oriented X3.  Speech normal, gait appropriate for age and unassisted.  Minimal hand tremor noticed Psych--  Cognition and judgment appear intact.  Cooperative with normal attention span and concentration.  Behavior appropriate. No anxious or depressed appearing.      Assessment & Plan:    Assessment Hyperlipidemia: Lipitor causes myalgias, declined it other statins, zetia didn't help Anxiety depression insomnia DJD Headaches Osteopenia: 07-2014 T score of -1.7, Rx calcium and vitamin D PUD due to NSAIDs RLS - robaxin  prn Glaucoma Chronic constipation Dx LSC (lichen )  ~ 4097 , sees derm    PLAN: Anxiety, depression, insomnia: Currently well controlled with Wellbutrin, Zoloft. HAs Tried Topamax for a few weeks, did not help, then she self DC it.  She realizes she has different type of headaches, takes Imitrex when she feels she has a migraine.  I encouraged also to take Robaxin -which she has-  when she feels it is more like a neck pain. 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 RTC 7-19 CPX

## 2017-07-24 NOTE — Progress Notes (Signed)
Pre visit review using our clinic review tool, if applicable. No additional management support is needed unless otherwise documented below in the visit note. 

## 2017-07-25 DIAGNOSIS — G250 Essential tremor: Secondary | ICD-10-CM | POA: Insufficient documentation

## 2017-07-25 NOTE — Assessment & Plan Note (Signed)
Anxiety, depression, insomnia: Currently well controlled with Wellbutrin, Zoloft. HAs Tried Topamax for a few weeks, did not help, then Cynthia [name] self DC it.  Cynthia [name] realizes Cynthia [name] has different type of headaches, takes Imitrex when Cynthia [name] feels Cynthia [name] has a migraine.  I encouraged also to take Robaxin -which Cynthia [name] has-  when Cynthia [name] feels it is more like a neck pain. 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 RTC 7-19 CPX

## 2017-07-27 ENCOUNTER — Other Ambulatory Visit: Payer: Self-pay | Admitting: Internal Medicine

## 2017-08-07 ENCOUNTER — Encounter: Payer: Self-pay | Admitting: Neurology

## 2017-08-07 ENCOUNTER — Ambulatory Visit (INDEPENDENT_AMBULATORY_CARE_PROVIDER_SITE_OTHER): Payer: Medicare Other | Admitting: Neurology

## 2017-08-07 VITALS — BP 104/70 | HR 76 | Resp 16 | Ht 65.0 in | Wt 160.0 lb

## 2017-08-07 DIAGNOSIS — M533 Sacrococcygeal disorders, not elsewhere classified: Secondary | ICD-10-CM

## 2017-08-07 DIAGNOSIS — G43809 Other migraine, not intractable, without status migrainosus: Secondary | ICD-10-CM

## 2017-08-07 DIAGNOSIS — M5416 Radiculopathy, lumbar region: Secondary | ICD-10-CM | POA: Diagnosis not present

## 2017-08-07 MED ORDER — SUMATRIPTAN SUCCINATE 100 MG PO TABS: ORAL_TABLET | ORAL | 5 refills | Status: DC

## 2017-08-07 NOTE — Patient Instructions (Addendum)
1.  Sumatriptan refilled 2.  Use the methocarbamol every night. 3.  If leg pain increases, let me know.  We can either try trial of gabapentin or evaluate for possible steroid injection. 4.  Follow up in 5 months.

## 2017-08-07 NOTE — Progress Notes (Signed)
NEUROLOGY FOLLOW UP OFFICE NOTE  ARRIYANNA [name] [id number]  HISTORY OF PRESENT ILLNESS: Cynthia [name] is a 72 year old right-handed female with depression, glaucoma and degenerative joint disease who follows up for chronic migraine as well as left leg pain and weakness   UPDATE: She took topiramate 25mg  twice daily but self-discontinued it when it was ineffective and she was concerned about worsening her glaucoma.  However, headaches have been under better control over the past 2 months.   She is uncertain of the exact frequency.  She reports increased neck stiffness.  Intensity:  7-8/10; December: 7-8/10 Duration:  20 minutes with sumatriptan; December: 20 minutes with sumatriptan Frequency:  Infrequent (uncertain of frequency); December: 15 headache days per month. Triggers:  uncertain.   Current NSAIDS:  No (contraindicated due to GI bleed) Current analgesics:  no Current triptans:  sumatriptan 100mg  (takes 1/2 tablet) Current anti-emetic:  no Current muscle relaxant:  Robaxin (once in awhile) Current Antihypertensive medications:  no Current Antidepressant medications:  sertraline 50mg , bupropion Current Anticonvulsant medications:  no. Current Vitamins/Herbal/Supplements:  B complex, Mg Current Antihistamines/Decongestants:  Mucinex, Astelin Other therapy:  no   Diet: Increased water, stopped soda. 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  She sees a Restaurant manager, fast food every 2 weeks.  Last week, she had a flare up, however the pain radiated from the left lower back/buttock down the lateral thigh up to the knee (instead of down the leg).  There was no associated numbness except for the chronic numbness in the left foot residual after fracture several years ago).  When she stood up, she felt that her leg was going to give out.  It has since calmed down.   HISTORY: She started  having headaches once in a while around 2015, but have progressively become more frequent in 2016.  They are usually left sided, involving the ear, but also may be right sided (involving the sinuses) or in band-like distribution.  It is both throbbing and non-throbbing.  Initially, it is usually 5/10 but severe episodes are 7-8/10.  Sometimes there is nausea.  There is phonophobia.  There is no photophobia or visual disturbance.  Initially, it lasts a couple of hours and occuring 15 days out of the month (3 to 4 days severe).  She reports sensation of congestion but no runny nose.  CT of sinuses showed opacified right sphenoid sinus and ethmoid air cell.  She was treated a couple of times with Augmentin and prednisone, which was effective for a while but headaches then returned.  She can't think of a trigger.  MRI of brain from 06/10/15 was normal.  Sed Rate from 12/27/15 was 10.     Past medication:  Tylenol, nortriptyline 50mg   PAST MEDICAL HISTORY: Past Medical History:  Diagnosis Date  . Allergy   . Anxiety   . Arthritis   . Blood transfusion without reported diagnosis   . Constipation, chronic   . DEGENERATIVE JOINT DISEASE 06/02/2010  . Depression   . Glaucoma 2014   Right eye  . Headache(784.0)   . Hyperlipidemia    hx of  . Osteopenia   . Psoriasis   . PUD (peptic ulcer disease) 2012   Upper GI bleed d/t NSAIDs  . RLS (restless legs syndrome)    robaxin prn    MEDICATIONS: Current Outpatient Medications on File Prior to Visit  Medication Sig Dispense Refill  .  acetaminophen (TYLENOL) 325 MG tablet Take 650 mg by mouth as needed.    Marland Kitchen azelastine (ASTELIN) 0.1 % nasal spray Place 2 sprays into both nostrils at bedtime. (Patient taking differently: Place 2 sprays into both nostrils at bedtime as needed. ) 30 mL 6  . B COMPLEX VITAMINS PO Take by mouth daily.    . Bepotastine Besilate (BEPREVE) 1.5 % SOLN Place 1 drop into both eyes 2 (two) times daily.    Marland Kitchen buPROPion (WELLBUTRIN  XL) 300 MG 24 hr tablet Take 1 tablet (300 mg total) by mouth daily. 90 tablet 1  . calcipotriene (DOVONOX) 0.005 % cream     . cetirizine (ZYRTEC) 10 MG tablet Take 10 mg by mouth daily.      . Cholecalciferol (VITAMIN D-400 PO) Take by mouth.      . clobetasol (TEMOVATE) 0.05 % external solution Apply 1 application topically 2 (two) times daily as needed.     Marland Kitchen guaiFENesin (MUCINEX) 600 MG 12 hr tablet Take by mouth as needed.    Marland Kitchen ketoconazole (NIZORAL) 2 % cream Apply topically daily. 30 g 0  . methocarbamol (ROBAXIN) 500 MG tablet Take 1 tablet (500 mg total) by mouth 3 (three) times daily as needed (RLS). 90 tablet 3  . Omega-3 Fatty Acids (FISH OIL) 1000 MG CPDR Take by mouth.      Marland Kitchen omeprazole (PRILOSEC) 20 MG capsule Take 1 capsule (20 mg total) by mouth daily. 30 capsule 5  . polyethylene glycol (MIRALAX / GLYCOLAX) packet Take 17 g by mouth daily as needed.    . sertraline (ZOLOFT) 100 MG tablet Take 1 tablet (100 mg total) by mouth daily. 30 tablet 5  . triamcinolone cream (KENALOG) 0.1 % Apply 1 application topically as needed.     . zolpidem (AMBIEN) 5 MG tablet TAKE ONE TABLET BY MOUTH AT BEDTIME AS NEEDED FOR SLEEP 30 tablet 5   No current facility-administered medications on file prior to visit.     ALLERGIES: Allergies  Allergen Reactions  . Motrin [Ibuprofen]     GI bleed  . Pollen Extract   . Statins     "delibitating pain all over body"  . Sulfonamide Derivatives     Pt unsure of reaction  . Ciprofloxacin Swelling and Rash    FAMILY HISTORY: Family History  Problem Relation Age of Onset  . Aneurysm Mother        brain  . Celiac disease Brother   . Diabetes Maternal Grandfather   . Hypertension Neg Hx   . Breast cancer Neg Hx   . Coronary artery disease Neg Hx   . Colon cancer Neg Hx   . Esophageal cancer Neg Hx   . Stomach cancer Neg Hx   . Rectal cancer Neg Hx     SOCIAL HISTORY: Social History   Socioeconomic History  . Marital status: Married     Spouse name: Not on file  . Number of children: 1  . Years of education: Not on file  . Highest education level: Not on file  Occupational History  . Occupation: retired VP of Tax inspector: RETIRED  Social Needs  . Financial resource strain: Not on file  . Food insecurity:    Worry: Not on file    Inability: Not on file  . Transportation needs:    Medical: Not on file    Non-medical: Not on file  Tobacco Use  . Smoking status: Never Smoker  . Smokeless  tobacco: Never Used  Substance and Sexual Activity  . Alcohol use: Yes    Comment: socially  . Drug use: No  . Sexual activity: Not on file  Lifestyle  . Physical activity:    Days per week: Not on file    Minutes per session: Not on file  . Stress: Not on file  Relationships  . Social connections:    Talks on phone: Not on file    Gets together: Not on file    Attends religious service: Not on file    Active member of club or organization: Not on file    Attends meetings of clubs or organizations: Not on file    Relationship status: Not on file  . Intimate partner violence:    Fear of current or ex partner: Not on file    Emotionally abused: Not on file    Physically abused: Not on file    Forced sexual activity: Not on file  Other Topics Concern  . Not on file  Social History Narrative   2 step children, 1 child , [place]- patient and her husband, he recently stop driving      REVIEW OF SYSTEMS: Constitutional: No fevers, chills, or sweats, no generalized fatigue, change in appetite Eyes: No visual changes, double vision, eye pain Ear, nose and throat: No hearing loss, ear pain, nasal congestion, sore throat Cardiovascular: No chest pain, palpitations Respiratory:  No shortness of breath at rest or with exertion, wheezes GastrointestinaI: No nausea, vomiting, diarrhea, abdominal pain, fecal incontinence Genitourinary:  No dysuria, urinary retention or frequency Musculoskeletal:  Neck  pain, back pain Integumentary: No rash, pruritus, skin lesions Neurological: as above Psychiatric: No depression, insomnia, anxiety Endocrine: No palpitations, fatigue, diaphoresis, mood swings, change in appetite, change in weight, increased thirst Hematologic/Lymphatic:  No purpura, petechiae. Allergic/Immunologic: no itchy/runny eyes, nasal congestion, recent allergic reactions, rashes  PHYSICAL EXAM: Vitals:   08/07/17 0920  BP: 104/70  Pulse: 76  Resp: 16  SpO2: 98%   General: No acute distress.  Patient appears well-groomed.   Head:  Normocephalic/atraumatic Eyes:  Fundi examined but not visualized Neck: supple, no paraspinal tenderness, full range of motion Heart:  Regular rate and rhythm Lungs:  Clear to auscultation bilaterally Back: left lower paraspinal tenderness Neurological Exam: alert and oriented to person, place, and time. Attention span and concentration intact, recent and remote memory intact, fund of knowledge intact.  Speech fluent and not dysarthric, language intact.  CN II-XII intact. Bulk and tone normal, muscle strength 5/5 throughout.  Sensation to light touch, temperature and vibration intact.  Deep tendon reflexes 2+ throughout, toes downgoing.  Finger to nose and heel to shin testing intact.  Gait normal, Romberg negative.  Straight leg raise negative bilaterally.  IMPRESSION: 1.  Cervicogenic migraine and tension type headache.  Stable for now. 2.  Left sided lumbosacral radiculopathy versus sacroiliac joint dysfunction  PLAN: 1.  No need to start a different headache preventative for now.  Continue to monitor 2.  Refilled sumatriptan as needed, limited to no more than 2 days out of week. 3.  Take Robaxin every night at bedtime for neck pain 4.  Regarding radiculopathy/sacroiliac joint dysfunction, she wishes to continue conservative management with chiropractor.  If it gets worse, try trial of gabapentin or possibly imaging to evaluate for possible  steroid injection 5.  Headache diary 6.  Follow up in 5 months.  25 minutes spent face to face with patient, over 50%  spent discussing management.  Metta Clines, DO  CC:  Kathlene November, MD

## 2017-08-13 ENCOUNTER — Ambulatory Visit: Payer: Medicare Other | Admitting: Obstetrics & Gynecology

## 2017-08-13 ENCOUNTER — Encounter: Payer: Self-pay | Admitting: Obstetrics & Gynecology

## 2017-08-13 VITALS — BP 130/71 | HR 74 | Wt 161.0 lb

## 2017-08-13 DIAGNOSIS — M7070 Other bursitis of hip, unspecified hip: Secondary | ICD-10-CM

## 2017-08-13 NOTE — Progress Notes (Signed)
History:  72 y.o. G1P1 here today for eval of pain in the pubic symphysis.  She reports continued care of her spouse who requires help with his ADLs.  She cannot take NSAIDs.  Pain unchanged from prev.  The following portions of the patient's history were reviewed and updated as appropriate: allergies, current medications, past family history, past medical history, past social history, past surgical history and problem list.  Review of Systems:  Pertinent items are noted in HPI.    Objective:  Physical Exam Blood pressure 130/71, pulse 74, weight 161 lb (73 kg).  CONSTITUTIONAL: Well-developed, well-nourished female in no acute distress.  HENT:  Normocephalic, atraumatic EYES: Conjunctivae and EOM are normal. No scleral icterus.  NECK: Normal range of motion SKIN: Skin is warm and dry. No rash noted. Not diaphoretic.No pallor. [place]: Alert and oriented to person, place, and time. Normal coordination.  Abd: Soft, nontender and nondistended Pelvic: there is tenderness to palpation directly over the symphysis pubis.    Assessment & Plan:  Pain over the symphysis pubis. Discussed with pt injection of steroid and local anesthesia. Pt would like to attempt this since she is not able to take NSAIDs  Meds to be ordered and pt will follow up for injection   May need to consider PT if this is not successful.  Germani Gavilanes L. Harraway-Beaubien, M.D., Cherlynn June

## 2017-08-13 NOTE — Patient Instructions (Signed)
Bursitis Bursitis is inflammation and irritation of a bursa, which is one of the small, fluid-filled sacs that cushion and protect the moving parts of your body. These sacs are located between bones and muscles, muscle attachments, or skin areas next to bones. A bursa protects these structures from the wear and tear that results from frequent movement. An inflamed bursa causes pain and swelling. Fluid may build up inside the sac. Bursitis is most common near joints, especially the knees, elbows, hips, and shoulders. What are the causes? Bursitis can be caused by:  Injury from: ? A direct blow, like falling on your knee or elbow. ? Overuse of a joint (repetitive stress).  Infection. This can happen if bacteria gets into a bursa through a cut or scrape near a joint.  Diseases that cause joint inflammation, such as gout and rheumatoid arthritis.  What increases the risk? You may be at risk for bursitis if you:  Have a job or hobby that involves a lot of repetitive stress on your joints.  Have a condition that weakens your body's defense system (immune system), such as diabetes, cancer, or HIV.  Lift and reach overhead often.  Kneel or lean on hard surfaces often.  Run or walk often.  What are the signs or symptoms? The most common signs and symptoms of bursitis are:  Pain that gets worse when you move the affected body part or put weight on it.  Inflammation.  Stiffness.  Other signs and symptoms may include:  Redness.  Tenderness.  Warmth.  Pain that continues after rest.  Fever and chills. This may occur in bursitis caused by infection.  How is this diagnosed? Bursitis may be diagnosed by:  Medical history and physical exam.  MRI.  A procedure to drain fluid from the bursa with a needle (aspiration). The fluid may be checked for signs of infection or gout.  Blood tests to rule out other causes of inflammation.  How is this treated? Bursitis can usually be  treated at home with rest, ice, compression, and elevation (RICE). For mild bursitis, RICE treatment may be all you need. Other treatments may include:  Nonsteroidal anti-inflammatory drugs (NSAIDs) to treat pain and inflammation.  Corticosteroids to fight inflammation. You may have these drugs injected into and around the area of bursitis.  Aspiration of bursitis fluid to relieve pain and improve movement.  Antibiotic medicine to treat an infected bursa.  A splint, brace, or walking aid.  Physical therapy if you continue to have pain or limited movement.  Surgery to remove a damaged or infected bursa. This may be needed if you have a very bad case of bursitis or if other treatments have not worked.  Follow these instructions at home:  Take medicines only as directed by your health care provider.  If you were prescribed an antibiotic medicine, finish it all even if you start to feel better.  Rest the affected area as directed by your health care provider. ? Keep the area elevated. ? Avoid activities that make pain worse.  Apply ice to the injured area: ? Place ice in a plastic bag. ? Place a towel between your skin and the bag. ? Leave the ice on for 20 minutes, 2-3 times a day.  Use splints, braces, pads, or walking aids as directed by your health care provider.  Keep all follow-up visits as directed by your health care provider. This is important. How is this prevented?  Wear knee pads if you kneel often.    Wear sturdy running or walking shoes that fit you well.  Take regular breaks from repetitive activity.  Warm up by stretching before doing any strenuous activity.  Maintain a healthy weight or lose weight as recommended by your health care provider. Ask your health care provider if you need help.  Exercise regularly. Start any new physical activity gradually. Contact a health care provider if:  Your bursitis is not responding to treatment or home care.  You have  a fever.  You have chills. This information is not intended to replace advice given to you by your health care provider. Make sure you discuss any questions you have with your health care provider. Document Released: 04/28/2000 Document Revised: 10/07/2015 Document Reviewed: 07/21/2013 Elsevier Interactive Patient Education  2018 [employer]  

## 2017-08-13 NOTE — Progress Notes (Signed)
Patient states that pubic pain has gotten more frequent in nature and stronger. Kathrene Alu RNBSN

## 2017-08-16 ENCOUNTER — Encounter: Payer: Self-pay | Admitting: Obstetrics & Gynecology

## 2017-08-21 ENCOUNTER — Other Ambulatory Visit: Payer: Self-pay | Admitting: Internal Medicine

## 2017-08-22 ENCOUNTER — Ambulatory Visit (INDEPENDENT_AMBULATORY_CARE_PROVIDER_SITE_OTHER): Payer: Medicare Other | Admitting: Obstetrics & Gynecology

## 2017-08-22 ENCOUNTER — Encounter: Payer: Self-pay | Admitting: Obstetrics & Gynecology

## 2017-08-22 VITALS — BP 131/66 | HR 81 | Ht 65.0 in | Wt 159.0 lb

## 2017-08-22 DIAGNOSIS — M7070 Other bursitis of hip, unspecified hip: Secondary | ICD-10-CM | POA: Diagnosis not present

## 2017-08-22 NOTE — Progress Notes (Signed)
Pt seen for injection of pubic symphysis.  Area cleaned with alcohol.  2cc of Kenalog 40 + 3 Lidocaine 2% x 2  was injected into the symphysis.  There were no complications. Pt instructed about s/sx of infection.  She tolerated the procedure well.  She will f/u in 2 week to eval sx.    Javan Gonzaga L. Harraway-Saline, M.D., Cherlynn June

## 2017-09-06 ENCOUNTER — Encounter: Payer: Self-pay | Admitting: Obstetrics & Gynecology

## 2017-09-06 ENCOUNTER — Encounter: Payer: Self-pay | Admitting: Neurology

## 2017-09-06 ENCOUNTER — Ambulatory Visit: Payer: Medicare Other | Admitting: Obstetrics & Gynecology

## 2017-09-06 VITALS — BP 125/68 | HR 71 | Wt 158.0 lb

## 2017-09-06 DIAGNOSIS — M7070 Other bursitis of hip, unspecified hip: Secondary | ICD-10-CM | POA: Diagnosis not present

## 2017-09-06 NOTE — Progress Notes (Signed)
History:  72 y.o. G1P1 here today for f/u of the pain and inflammation in her symphysis pubis.  Pt reports that her sx are present but, improved. She reports that at present the pain is bearable and not limiting as prev.   The following portions of the patient's history were reviewed and updated as appropriate: allergies, current medications, past family history, past medical history, past social history, past surgical history and problem list.  Review of Systems:  Pertinent items are noted in HPI.    Objective:  Physical Exam Blood pressure 125/68, pulse 71, weight 158 lb (71.7 kg).  CONSTITUTIONAL: Well-developed, well-nourished female in no acute distress.  HENT:  Normocephalic, atraumatic EYES: Conjunctivae and EOM are normal. No scleral icterus.  NECK: Normal range of motion SKIN: Skin is warm and dry. No rash noted. Not diaphoretic.No pallor. [place]: Alert and oriented to person, place, and time. Normal coordination.  Abd: Soft, nontender and nondistended Pelvic: the pubic symphysis is no longer painful to the touch.    Assessment & Plan:  bursitis symphysis pubis-  Pt offered PT vs a course of steroids vs expectant management.   Pt opt for expectant management at present as her sx have imporved.  F/u in 3 months or sooner prn  Total face-to-face time with patient was 36min.  Greater than 50% was spent in counseling and coordination of care with the patient.   Ilay Capshaw L. Harraway-Koerner, M.D., Cherlynn June

## 2017-10-08 ENCOUNTER — Other Ambulatory Visit: Payer: Self-pay | Admitting: Internal Medicine

## 2017-11-01 ENCOUNTER — Other Ambulatory Visit: Payer: Self-pay | Admitting: Internal Medicine

## 2017-11-01 DIAGNOSIS — Z1231 Encounter for screening mammogram for malignant neoplasm of breast: Secondary | ICD-10-CM

## 2017-11-06 ENCOUNTER — Ambulatory Visit (HOSPITAL_BASED_OUTPATIENT_CLINIC_OR_DEPARTMENT_OTHER)
Admission: RE | Admit: 2017-11-06 | Discharge: 2017-11-06 | Disposition: A | Discharge status: Home / Self Care | Payer: Medicare Other | Source: Ambulatory Visit | Attending: Internal Medicine | Admitting: Internal Medicine

## 2017-11-06 DIAGNOSIS — Z1231 Encounter for screening mammogram for malignant neoplasm of breast: Secondary | ICD-10-CM | POA: Insufficient documentation

## 2017-11-28 ENCOUNTER — Other Ambulatory Visit: Payer: Self-pay | Admitting: Internal Medicine

## 2017-11-29 ENCOUNTER — Ambulatory Visit (INDEPENDENT_AMBULATORY_CARE_PROVIDER_SITE_OTHER): Payer: Medicare Other | Admitting: Internal Medicine

## 2017-11-29 ENCOUNTER — Encounter: Payer: Self-pay | Admitting: Internal Medicine

## 2017-11-29 VITALS — BP 120/68 | HR 67 | Temp 98.3°F | Resp 14 | Ht 65.0 in | Wt 160.5 lb

## 2017-11-29 DIAGNOSIS — Z0000 Encounter for general adult medical examination without abnormal findings: Secondary | ICD-10-CM

## 2017-11-29 DIAGNOSIS — E785 Hyperlipidemia, unspecified: Secondary | ICD-10-CM

## 2017-11-29 LAB — COMPREHENSIVE METABOLIC PANEL
ALT: 15 U/L (ref 0–35)
AST: 19 U/L (ref 0–37)
Albumin: 4.3 g/dL (ref 3.5–5.2)
Alkaline Phosphatase: 103 U/L (ref 39–117)
BUN: 11 mg/dL (ref 6–23)
CHLORIDE: 103 meq/L (ref 96–112)
CO2: 31 meq/L (ref 19–32)
Calcium: 9.4 mg/dL (ref 8.4–10.5)
Creatinine, Ser: 0.81 mg/dL (ref 0.40–1.20)
GFR: 73.58 mL/min (ref >60.00)
GLUCOSE: 96 mg/dL (ref 70–99)
POTASSIUM: 4.6 meq/L (ref 3.5–5.1)
SODIUM: 138 meq/L (ref 135–145)
TOTAL PROTEIN: 6.6 g/dL (ref 6.0–8.3)
Total Bilirubin: 0.6 mg/dL (ref 0.2–1.2)

## 2017-11-29 LAB — CBC WITH DIFFERENTIAL/PLATELET
Basophils Absolute: 0.1 10*3/uL (ref 0.0–0.1)
Basophils Relative: 1.3 % (ref 0.0–3.0)
EOS ABS: 0.5 10*3/uL (ref 0.0–0.7)
Eosinophils Relative: 8.8 % — ABNORMAL HIGH (ref 0.0–5.0)
HCT: 38.1 % (ref 36.0–46.0)
HEMOGLOBIN: 13 g/dL (ref 12.0–15.0)
Lymphocytes Relative: 23.6 % (ref 12.0–46.0)
Lymphs Abs: 1.3 10*3/uL (ref 0.7–4.0)
MCHC: 34 g/dL (ref 30.0–36.0)
MCV: 94.1 fl (ref 78.0–100.0)
MONO ABS: 0.6 10*3/uL (ref 0.1–1.0)
Monocytes Relative: 11 % (ref 3.0–12.0)
NEUTROS PCT: 55.3 % (ref 43.0–77.0)
Neutro Abs: 3 10*3/uL (ref 1.4–7.7)
Platelets: 306 10*3/uL (ref 150.0–400.0)
RBC: 4.06 Mil/uL (ref 3.87–5.11)
RDW: 13.2 % (ref 11.5–15.5)
WBC: 5.5 10*3/uL (ref 4.0–10.5)

## 2017-11-29 LAB — LIPID PANEL
CHOL/HDL RATIO: 4
Cholesterol: 255 mg/dL — ABNORMAL HIGH (ref 0–200)
HDL: 68.9 mg/dL (ref >39.00)
LDL CALC: 169 mg/dL — AB (ref 0–99)
NONHDL: 186.18
Triglycerides: 87 mg/dL (ref 0.0–149.0)
VLDL: 17.4 mg/dL (ref 0.0–40.0)

## 2017-11-29 NOTE — Assessment & Plan Note (Signed)
Hyperlipidemia: Unable to take medication Anxiety, depression, insomnia: Controlled on Wellbutrin, sertraline and Ambien. 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  Was recommended daily Robaxin, she takes it prn.  Recommend to take it daily and continue sumatriptan.  Call if any unusual headaches Left foot pain: Metatarsalgia?  Will call for a foot doctor referral when needed Osteopenia: On vitamin D, last DEXA 2016.  Consider DEXA in 1 to 2 years. RTC 1 year, sooner if needed.

## 2017-11-29 NOTE — Progress Notes (Signed)
Subjective:    Patient ID: Cynthia [name], female    DOB: [birth year], 72 y.o.   MRN: [id number]  DOS:  11/29/2017 Type of visit - description : cpx Interval history: Here for a CPX, see review of systems   Review of Systems History of tremors, at baseline Headaches, still there, they are frequent but not daily.  Robaxin helps, takes it as needed. History of LSC (lichen) symptoms somewhat increased during the summer, using a number of creams. Complaint of pain at the left mid food.  History of remote fracture there.  Other than above, a 14 point review of systems is negative    Past Medical History:  Diagnosis Date  . Allergy   . Anxiety   . Arthritis   . Blood transfusion without reported diagnosis   . Constipation, chronic   . DEGENERATIVE JOINT DISEASE 06/02/2010  . Depression   . Glaucoma 2014   Right eye  . Headache(784.0)   . Hyperlipidemia    hx of  . Osteopenia   . Psoriasis   . PUD (peptic ulcer disease) 2012   Upper GI bleed d/t NSAIDs  . RLS (restless legs syndrome)    robaxin prn    Past Surgical History:  Procedure Laterality Date  . ANKLE FRACTURE SURGERY Left    surgery 12-11 (LEFT)  . [place]    Social History   Socioeconomic History  . Marital status: Married    Spouse name: Not on file  . Number of children: 1  . Years of education: Not on file  . Highest education level: Not on file  Occupational History  . Occupation: retired VP of Tax inspector: RETIRED  Social Needs  . Financial resource strain: Not on file  . Food insecurity:    Worry: Not on file    Inability: Not on file  . Transportation needs:    Medical: Not on file    Non-medical: Not on file  Tobacco Use  . Smoking status: Never Smoker  . Smokeless tobacco: Never Used  Substance and Sexual Activity  . Alcohol use: Yes    Comment: socially  . Drug use: No  . Sexual activity: Not on file  Lifestyle  . Physical activity:    Days per week:  Not on file    Minutes per session: Not on file  . Stress: Not on file  Relationships  . Social connections:    Talks on phone: Not on file    Gets together: Not on file    Attends religious service: Not on file    Active member of club or organization: Not on file    Attends meetings of clubs or organizations: Not on file    Relationship status: Not on file  . Intimate partner violence:    Fear of current or ex partner: Not on file    Emotionally abused: Not on file    Physically abused: Not on file    Forced sexual activity: Not on file  Other Topics Concern  . Not on file  Social History Narrative   2 step children, 1 child , 7 GK   Household- patient and her husband, he recently stop driving       Family History  Problem Relation Age of Onset  . Aneurysm Mother        brain  . Celiac disease Brother   . Diabetes Maternal Grandfather   . Hypertension Neg Hx   .  Breast cancer Neg Hx   . Coronary artery disease Neg Hx   . Colon cancer Neg Hx   . Esophageal cancer Neg Hx   . Stomach cancer Neg Hx   . Rectal cancer Neg Hx      Allergies as of 11/29/2017      Reactions   Motrin [ibuprofen]    GI bleed   Pollen Extract    Statins    "delibitating pain all over body"   Sulfonamide Derivatives    Pt unsure of reaction   Ciprofloxacin Swelling, Rash      Medication List        Accurate as of 11/29/17  5:53 PM. Always use your most recent med list.          acetaminophen 325 MG tablet Commonly known as:  TYLENOL Take 650 mg by mouth as needed.   azelastine 0.1 % nasal spray Commonly known as:  ASTELIN Place 2 sprays into both nostrils at bedtime.   B COMPLEX VITAMINS PO Take by mouth daily.   BEPREVE 1.5 % Soln Generic drug:  Bepotastine Besilate Place 1 drop into both eyes 2 (two) times daily.   buPROPion 300 MG 24 hr tablet Commonly known as:  WELLBUTRIN XL Take 1 tablet (300 mg total) by mouth daily.   calcipotriene 0.005 % cream Commonly known  as:  DOVONOX   cetirizine 10 MG tablet Commonly known as:  ZYRTEC Take 10 mg by mouth daily.   clobetasol 0.05 % external solution Commonly known as:  TEMOVATE Apply 1 application topically 2 (two) times daily as needed.   Fish Oil 1000 MG Cpdr Take by mouth.   guaiFENesin 600 MG 12 hr tablet Commonly known as:  MUCINEX Take by mouth as needed.   methocarbamol 500 MG tablet Commonly known as:  ROBAXIN Take 1 tablet (500 mg total) by mouth 3 (three) times daily as needed (RLS).   omeprazole 20 MG capsule Commonly known as:  PRILOSEC Take 1 capsule (20 mg total) by mouth daily.   polyethylene glycol packet Commonly known as:  MIRALAX / GLYCOLAX Take 17 g by mouth daily as needed.   sertraline 100 MG tablet Commonly known as:  ZOLOFT Take 1 tablet (100 mg total) by mouth daily.   SUMAtriptan 100 MG tablet Commonly known as:  IMITREX TAKE 1 TABLET BY MOUTH AT EARLIEST ONSET OF HEADACHE. MAY REPEAT ONCE IN 2 HOURS IF HEADACHE PERSISTS OR RECURS.   triamcinolone cream 0.1 % Commonly known as:  KENALOG Apply 1 application topically as needed.   VITAMIN D-400 PO Take by mouth.   zolpidem 5 MG tablet Commonly known as:  AMBIEN TAKE ONE TABLET BY MOUTH AT BEDTIME AS NEEDED FOR SLEEP          Objective:   Physical Exam BP 120/68 (BP Location: Left Arm, Patient Position: Sitting, Cuff Size: Small)   Pulse 67   Temp 98.3 F (36.8 C) (Oral)   Resp 14   Ht 5\' 5"  (1.651 m)   Wt 160 lb 8 oz (72.8 kg)   SpO2 97%   BMI 26.71 kg/m  General: Well developed, NAD, see BMI.  Neck: No  thyromegaly  HEENT:  Normocephalic . Face symmetric, atraumatic Lungs:  CTA B Normal respiratory effort, no intercostal retractions, no accessory muscle use. Heart: RRR,  no murmur.  No pretibial edema bilaterally  Abdomen:  Not distended, soft, non-tender. No rebound or rigidity.   Skin: Exposed areas without rash. Not pale. Not jaundice  MSK: Feet symmetric, some changes of DJD,  slightly TTP at the distal midfoot plantar area. Neurologic:  alert & oriented X3.  Speech normal, gait appropriate for age and unassisted Strength symmetric and appropriate for age.  Psych: Cognition and judgment appear intact.  Cooperative with normal attention span and concentration.  Behavior appropriate. No anxious or depressed appearing.     Assessment & Plan:   Assessment Hyperlipidemia: Lipitor causes myalgias, declined it other statins, zetia didn't help Anxiety depression insomnia DJD Headaches Osteopenia: 07-2014 T score of -1.7, Rx   vitamin D PUD due to NSAIDs RLS - robaxin  prn Glaucoma Chronic constipation Dx LSC (lichen )  ~ 6803 , sees derm    PLAN: Hyperlipidemia: Unable to take medication Anxiety, depression, insomnia: Controlled on Wellbutrin, sertraline and Ambien. 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  Was recommended daily Robaxin, she takes it prn.  Recommend to take it daily and continue sumatriptan.  Call if any unusual headaches Left foot pain: Metatarsalgia?  Will call for a foot doctor referral when needed Osteopenia: On vitamin D, last DEXA 2016.  Consider DEXA in 1 to 2 years. RTC 1 year, sooner if needed.

## 2017-11-29 NOTE — Assessment & Plan Note (Addendum)
-  Td   2011; pneumonia shot: 2011;prevnar: 2016;shingles shot 2009; has not received  shingrex yet -[facility]: Colonoscopy: 05/15/2002, cscope 8-15, 1 polyp, 5 years --No further  PAPs, see previous entries  - last MMG 10/2017 per [facility] -Diet-exercise discussed  -Labs: CMP, FLP, CBC

## 2017-11-29 NOTE — Progress Notes (Signed)
Pre visit review using our clinic review tool, if applicable. No additional management support is needed unless otherwise documented below in the visit note. 

## 2017-11-29 NOTE — Patient Instructions (Addendum)
GO TO THE LAB : Get the blood work     GO TO THE FRONT DESK Schedule your next appointment for a physical exam in 1 year    Take Robaxin every night  Call me or your neurologist if you have severe or unusual headaches

## 2017-12-06 ENCOUNTER — Ambulatory Visit: Payer: Medicare Other | Admitting: Obstetrics & Gynecology

## 2017-12-10 ENCOUNTER — Ambulatory Visit: Payer: Medicare Other | Admitting: Obstetrics & Gynecology

## 2017-12-20 ENCOUNTER — Other Ambulatory Visit: Payer: Self-pay | Admitting: Internal Medicine

## 2017-12-31 ENCOUNTER — Encounter: Payer: Self-pay | Admitting: Obstetrics & Gynecology

## 2017-12-31 ENCOUNTER — Ambulatory Visit (INDEPENDENT_AMBULATORY_CARE_PROVIDER_SITE_OTHER): Payer: Medicare Other | Admitting: Obstetrics & Gynecology

## 2017-12-31 VITALS — BP 130/80 | HR 68 | Ht 65.0 in | Wt 160.0 lb

## 2017-12-31 DIAGNOSIS — M7070 Other bursitis of hip, unspecified hip: Secondary | ICD-10-CM

## 2017-12-31 DIAGNOSIS — B373 Candidiasis of vulva and vagina: Secondary | ICD-10-CM

## 2017-12-31 DIAGNOSIS — B3731 Acute candidiasis of vulva and vagina: Secondary | ICD-10-CM

## 2017-12-31 MED ORDER — CLOTRIMAZOLE-BETAMETHASONE 1-0.05 % EX CREA: TOPICAL_CREAM | CUTANEOUS | 0 refills | Status: DC

## 2017-12-31 MED ORDER — FLUCONAZOLE 150 MG PO TABS: 150.0000 mg | ORAL_TABLET | Freq: Once | ORAL | 3 refills | Status: AC

## 2017-12-31 NOTE — Progress Notes (Signed)
History:  72 y.o. G1P1 here today for follow up of pain of the pubic symphysis. Pt reports itching of the upper thigh and  Continued pain of the symphysis pubis that has not improved. 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 She is worried about the amount of time that PT might entail but, if it will work she is interested. Pt denies new sx.    The following portions of the patient's history were reviewed and updated as appropriate: allergies, current medications, past family history, past medical history, past social history, past surgical history and problem list.  Review of Systems:  Pertinent items are noted in HPI.    Objective:  Physical Exam Blood pressure 130/80, pulse 68, height 5\' 5"  (1.651 m), weight 160 lb (72.6 kg).  CONSTITUTIONAL: Well-developed, well-nourished female in no acute distress.  HENT:  Normocephalic, atraumatic EYES: Conjunctivae and EOM are normal. No scleral icterus.  NECK: Normal range of motion SKIN: Skin is warm and dry. No rash noted. Not diaphoretic.No pallor. [place]: Alert and oriented to person, place, and time. Normal coordination.   Abd: Soft, nontender and nondistended; no rebound or guarding  Pelvic: Normal appearing external genitalia; there is still pain directly over the symphysis pubic. There is tenderness to palpation in this area. There is no defect.    There is no LA.     Assessment & Plan:  Bursitis of the symphysis pubis  Rec pelvic PT eval and management   F/u in 6 weeks or sooner prn  Yeast vaginitis  Diflucan 150mg  po q day x1  Miconazole x 7 days- apply to affected areas    Anai Lipson L. Harraway-Crouse, M.D., Cherlynn June

## 2017-12-31 NOTE — Progress Notes (Signed)
Patient states symptoms have worsen. Increase in pain frequency. Kathrene Alu RN

## 2018-01-07 ENCOUNTER — Encounter: Payer: Self-pay | Admitting: Obstetrics & Gynecology

## 2018-01-07 MED ORDER — MICONAZOLE NITRATE 2 % VA CREA: 1.0000 | TOPICAL_CREAM | Freq: Every day | VAGINAL | 2 refills | Status: DC

## 2018-01-07 MED ORDER — FLUCONAZOLE 150 MG PO TABS: 150.0000 mg | ORAL_TABLET | Freq: Once | ORAL | 0 refills | Status: AC

## 2018-01-07 NOTE — Progress Notes (Signed)
NEUROLOGY FOLLOW UP OFFICE NOTE  Cynthia [name] [id number]  HISTORY OF PRESENT ILLNESS: Cynthia [name] is a 72 year old right-handed female with depression, glaucoma, and degenerative joint disease who follows up for chronic migraine.  UPDATE: Intensity:  moderate Duration:  1 hour with sumatriptan Frequency:  Every night, wakes her up at 4AM Current NSAIDS:  no Current analgesics:  no Current triptans:  Sumatriptan 100mg  Current ergotamine:  no Current anti-emetic:  no Current muscle relaxants:  Robaxin 500mg  TID Current anti-anxiolytic:  no Current sleep aide:  Ambien Current Antihypertensive medications:  no Current Antidepressant medications:  Sertraline 100mg , bupropion 300mg  Current Anticonvulsant medications:  no Current anti-CGRP:  no Current Vitamins/Herbal/Supplements:  B complex, Mg Current Antihistamines/Decongestants:  Mucinex, Astelin Other therapy:  no  Depression/anxiety:  yes  HISTORY: She started having headaches infrequently in 215 but progressively became more frequent in 2016. Varies, but they are usually left sided, involving the ear, but also may be right sided (involving the sinuses) or in band-like distribution. It is both throbbing and non-throbbing. Initially, it is usually 5/10 but severe episodes are 7-8/10. Sometimes there is nausea. There is phonophobia. There is no associated photophobia, visual disturbance or unilateral numbness or weakness. Initially, it lasts a couple of hours and occuring 15 days out of the month (3 to 4 days severe). She reports sensation of congestion but no runny nose. CT of sinuses showed opacified right sphenoid sinus and ethmoid air cell. She was treated a couple of times with Augmentin and prednisone, which was effective for a while but headaches then returned. She can't think of a trigger.  Worse during summer.  There are no aggravating or relieving factors.  MRI of brain from 06/10/15 was normal.  Sed Rate from  12/27/15 was 10.   Past NSAIDS:  no Past analgesics:  Tylenol Past abortive triptans:  no Past abortive ergotamine:  no Past muscle relaxants:  no Past anti-emetic:  no Past antihypertensive medications:  no Past antidepressant medications:  Nortriptyline 50mg  Past anticonvulsant medications:  topiramate 25mg  (concerned about glaucoma) Past anti-CGRP:  no Past vitamins/Herbal/Supplements:  no Past antihistamines/decongestants:  no Other past therapies:  no  PAST MEDICAL HISTORY: Past Medical History:  Diagnosis Date  . Allergy   . Anxiety   . Arthritis   . Blood transfusion without reported diagnosis   . Constipation, chronic   . DEGENERATIVE JOINT DISEASE 06/02/2010  . Depression   . Glaucoma 2014   Right eye  . Headache(784.0)   . Hyperlipidemia    hx of  . Osteopenia   . Psoriasis   . PUD (peptic ulcer disease) 2012   Upper GI bleed d/t NSAIDs  . RLS (restless legs syndrome)    robaxin prn    MEDICATIONS: Current Outpatient Medications on File Prior to Visit  Medication Sig Dispense Refill  . acetaminophen (TYLENOL) 325 MG tablet Take 650 mg by mouth as needed.    Marland Kitchen azelastine (ASTELIN) 0.1 % nasal spray Place 2 sprays into both nostrils at bedtime. (Patient taking differently: Place 2 sprays into both nostrils at bedtime as needed. ) 30 mL 6  . B COMPLEX VITAMINS PO Take by mouth daily.    . Bepotastine Besilate (BEPREVE) 1.5 % SOLN Place 1 drop into both eyes 2 (two) times daily.    Marland Kitchen buPROPion (WELLBUTRIN XL) 300 MG 24 hr tablet Take 1 tablet (300 mg total) by mouth daily. 90 tablet 1  . calcipotriene (DOVONOX) 0.005 % cream     .  cetirizine (ZYRTEC) 10 MG tablet Take 10 mg by mouth daily.      . Cholecalciferol (VITAMIN D-400 PO) Take by mouth.      . clobetasol (TEMOVATE) 0.05 % external solution Apply 1 application topically 2 (two) times daily as needed.     . clotrimazole-betamethasone (LOTRISONE) cream Apply to affected area twice a day until clear 120 g  0  . guaiFENesin (MUCINEX) 600 MG 12 hr tablet Take by mouth as needed.    . methocarbamol (ROBAXIN) 500 MG tablet Take 1 tablet (500 mg total) by mouth 3 (three) times daily as needed (RLS). 90 tablet 3  . Omega-3 Fatty Acids (FISH OIL) 1000 MG CPDR Take by mouth.      Marland Kitchen omeprazole (PRILOSEC) 20 MG capsule Take 1 capsule (20 mg total) by mouth daily. 90 capsule 3  . polyethylene glycol (MIRALAX / GLYCOLAX) packet Take 17 g by mouth daily as needed.    . sertraline (ZOLOFT) 100 MG tablet Take 1 tablet (100 mg total) by mouth daily. 30 tablet 5  . SUMAtriptan (IMITREX) 100 MG tablet TAKE 1 TABLET BY MOUTH AT EARLIEST ONSET OF HEADACHE. MAY REPEAT ONCE IN 2 HOURS IF HEADACHE PERSISTS OR RECURS. 10 tablet 5  . triamcinolone cream (KENALOG) 0.1 % Apply 1 application topically as needed.     . zolpidem (AMBIEN) 5 MG tablet TAKE ONE TABLET BY MOUTH AT BEDTIME AS NEEDED FOR SLEEP 30 tablet 5   No current facility-administered medications on file prior to visit.     ALLERGIES: Allergies  Allergen Reactions  . Motrin [Ibuprofen]     GI bleed  . Pollen Extract   . Statins     "delibitating pain all over body"  . Sulfonamide Derivatives     Pt unsure of reaction  . Ciprofloxacin Swelling and Rash    FAMILY HISTORY: Family History  Problem Relation Age of Onset  . Aneurysm Mother        brain  . Celiac disease Brother   . Diabetes Maternal Grandfather   . Hypertension Neg Hx   . Breast cancer Neg Hx   . Coronary artery disease Neg Hx   . Colon cancer Neg Hx   . Esophageal cancer Neg Hx   . Stomach cancer Neg Hx   . Rectal cancer Neg Hx     SOCIAL HISTORY: Social History   Socioeconomic History  . Marital status: Married    Spouse name: Not on file  . Number of children: 1  . Years of education: Not on file  . Highest education level: Not on file  Occupational History  . Occupation: retired VP of Tax inspector: RETIRED  Social Needs  . Financial resource strain:  Not on file  . Food insecurity:    Worry: Not on file    Inability: Not on file  . Transportation needs:    Medical: Not on file    Non-medical: Not on file  Tobacco Use  . Smoking status: Never Smoker  . Smokeless tobacco: Never Used  Substance and Sexual Activity  . Alcohol use: Yes    Comment: socially  . Drug use: No  . Sexual activity: Not on file  Lifestyle  . Physical activity:    Days per week: Not on file    Minutes per session: Not on file  . Stress: Not on file  Relationships  . Social connections:    Talks on phone: Not on file  Gets together: Not on file    Attends religious service: Not on file    Active member of club or organization: Not on file    Attends meetings of clubs or organizations: Not on file    Relationship status: Not on file  . Intimate partner violence:    Fear of current or ex partner: Not on file    Emotionally abused: Not on file    Physically abused: Not on file    Forced sexual activity: Not on file  Other Topics Concern  . Not on file  Social History Narrative   2 step children, 1 child , [place]- patient and her husband, he recently stop driving      REVIEW OF SYSTEMS: Constitutional: No fevers, chills, or sweats, no generalized fatigue, change in appetite Eyes: No visual changes, double vision, eye pain Ear, nose and throat: No hearing loss, ear pain, nasal congestion, sore throat Cardiovascular: No chest pain, palpitations Respiratory:  No shortness of breath at rest or with exertion, wheezes GastrointestinaI: No nausea, vomiting, diarrhea, abdominal pain, fecal incontinence Genitourinary:  No dysuria, urinary retention or frequency Musculoskeletal:  Back pain, neck pain Integumentary: No rash, pruritus, skin lesions Neurological: as above Psychiatric: No depression, insomnia, anxiety Endocrine: No palpitations, fatigue, diaphoresis, mood swings, change in appetite, change in weight, increased  thirst Hematologic/Lymphatic:  No purpura, petechiae. Allergic/Immunologic: no itchy/runny eyes, nasal congestion, recent allergic reactions, rashes  PHYSICAL EXAM: Blood pressure 108/80, pulse 87, height 5\' 5"  (1.651 m), weight 163 lb (73.9 kg), SpO2 97 %. General: No acute distress.  Patient appears well-groomed.  normal body habitus. Head:  Normocephalic/atraumatic Eyes:  Fundi examined but not visualized Neck: supple, no paraspinal tenderness, full range of motion Heart:  Regular rate and rhythm Lungs:  Clear to auscultation bilaterally Back: No paraspinal tenderness Neurological Exam: alert and oriented to person, place, and time. Attention span and concentration intact, recent and remote memory intact, fund of knowledge intact.  Speech fluent and not dysarthric, language intact.  CN II-XII intact. Bulk and tone normal, muscle strength 5/5 throughout.  Sensation to light touch  intact.  Deep tendon reflexes 2+ throughout.  Finger to nose testing intact.  Gait normal, Romberg negative.  IMPRESSION: Possible hypnic headaches  PLAN: 1.  Try drinking one cup of strong black coffee every evening (or 100mg  to 200mg  caffeine every evening) 2.  Also consider taking melatonin at night, from 10mg  to 16mg . 3.  Sumatriptan for abortive therapy.  Limit use of sumatriptan to no more than 2 days out of week to prevent rebound headache 4.  Keep headache diary 5.  If headaches still frequent in 4 to 6 weeks, contact me and we can start another daily preventative. 6.  Follow up in 4 months  Metta Clines, DO  CC: Kathlene November, MD

## 2018-01-08 ENCOUNTER — Encounter: Payer: Self-pay | Admitting: Neurology

## 2018-01-08 ENCOUNTER — Ambulatory Visit (INDEPENDENT_AMBULATORY_CARE_PROVIDER_SITE_OTHER): Payer: Medicare Other | Admitting: Neurology

## 2018-01-08 VITALS — BP 108/80 | HR 87 | Ht 65.0 in | Wt 163.0 lb

## 2018-01-08 DIAGNOSIS — G4481 Hypnic headache: Secondary | ICD-10-CM | POA: Diagnosis not present

## 2018-01-08 NOTE — Patient Instructions (Signed)
You may have hypnic headaches (or alarm clock headaches) 1.  Try drinking one cup of strong black coffee every evening (or 100mg  to 200mg  caffeine every evening) 2.  Also consider taking melatonin at night, from 10mg  to 16mg . 3.  Limit use of sumatriptan to no more than 2 days out of week to prevent rebound headache 4.  Keep headache diary 5.  If headaches still frequent in 4 to 6 weeks, contact me and we can start another daily preventative. 6.  Follow up in 4 months

## 2018-01-09 ENCOUNTER — Other Ambulatory Visit: Payer: Self-pay

## 2018-01-09 ENCOUNTER — Ambulatory Visit: Payer: Medicare Other | Attending: Obstetrics & Gynecology | Admitting: Physical Therapy

## 2018-01-09 ENCOUNTER — Encounter: Payer: Self-pay | Admitting: Physical Therapy

## 2018-01-09 ENCOUNTER — Ambulatory Visit: Payer: Medicare Other | Admitting: Physical Therapy

## 2018-01-09 DIAGNOSIS — M62838 Other muscle spasm: Secondary | ICD-10-CM | POA: Diagnosis present

## 2018-01-09 DIAGNOSIS — M6281 Muscle weakness (generalized): Secondary | ICD-10-CM | POA: Diagnosis not present

## 2018-01-09 NOTE — Patient Instructions (Signed)
Toileting Techniques for Bowel Movements (Defecation) Using your belly (abdomen) and pelvic floor muscles to have a bowel movement is usually instinctive.  Sometimes people can have problems with these muscles and have to relearn proper defecation (emptying) techniques.  If you have weakness in your muscles, organs that are falling out, decreased sensation in your pelvis, or ignore your urge to go, you may find yourself straining to have a bowel movement.  You are straining if you are: . holding your breath or taking in a huge gulp of air and holding it  . keeping your lips and jaw tensed and closed tightly . turning red in the face because of excessive pushing or forcing . developing or worsening your  hemorrhoids . getting faint while pushing . not emptying completely and have to defecate many times a day  If you are straining, you are actually making it harder for yourself to have a bowel movement.  Many people find they are pulling up with the pelvic floor muscles and closing off instead of opening the anus. Due to lack pelvic floor relaxation and coordination the abdominal muscles, one has to work harder to push the feces out.  Many people have never been taught how to defecate efficiently and effectively.  Notice what happens to your body when you are having a bowel movement.  While you are sitting on the toilet pay attention to the following areas: . Jaw and mouth position . Angle of your hips   . Whether your feet touch the ground or not . Arm placement  . Spine position . Waist . Belly tension . Anus (opening of the anal canal)  An Evacuation/Defecation Plan   Here are the 4 basic points:  1. Lean forward enough for your elbows to rest on your knees 2. Support your feet on the floor or use a low stool if your feet don't touch the floor  3. Push out your belly as if you have swallowed a beach ball-you should feel a widening of your waist 4. Open and relax your pelvic floor muscles,  rather than tightening around the anus      The following conditions my require modifications to your toileting posture:  . If you have had surgery in the past that limits your back, hip, pelvic, knee or ankle flexibility . Constipation   Your healthcare practitioner may make the following additional suggestions and adjustments:  1) Sit on the toilet  a) Make sure your feet are supported. b) Notice your hip angle and spine position-most people find it effective to lean forward or raise their knees, which can help the muscles around the anus to relax  c) When you lean forward, place your forearms on your thighs for support  2) Relax suggestions a) Breath deeply in through your nose and out slowly through your mouth as if you are smelling the flowers and blowing out the candles. b) To become aware of how to relax your muscles, contracting and releasing muscles can be helpful.  Pull your pelvic floor muscles in tightly by using the image of holding back gas, or closing around the anus (visualize making a circle smaller) and lifting the anus up and in.  Then release the muscles and your anus should drop down and feel open. Repeat 5 times ending with the feeling of relaxation. c) Keep your pelvic floor muscles relaxed; let your belly bulge out. d) The digestive tract starts at the mouth and ends at the anal opening, so be   sure to relax both ends of the tube.  Place your tongue on the roof of your mouth with your teeth separated.  This helps relax your mouth and will help to relax the anus at the same time.  3) Empty (defecation) a) Keep your pelvic floor and sphincter relaxed, then bulge your anal muscles.  Make the anal opening wide.  b) Stick your belly out as if you have swallowed a beach ball. c) Make your belly wall hard using your belly muscles while continuing to breathe. Doing this makes it easier to open your anus. d) Breath out and give a grunt (or try using other sounds such as  ahhhh, shhhhh, ohhhh or grrrrrrr).  4) Finish a) As you finish your bowel movement, pull the pelvic floor muscles up and in.  This will leave your anus in the proper place rather than remaining pushed out and down. If you leave your anus pushed out and down, it will start to feel as though that is normal and give you incorrect signals about needing to have a bowel movement.   [facility] [address] [place], [place] [address] Relaxation Exercises with the Urge to Void   When you experience an urge to void:  FIRST  Stop and stand very still    Sit down if you can    Don't move    You need to stay very still to maintain control  SECOND Squeeze your pelvic floor muscles 5 times, like a quick flick, to keep from leaking  THIRD Relax  Take a deep breath and then let it out  Try to make the urge go away by using relaxation and visualization techniques  FINALLY When you feel the urge go away somewhat, walk normally to the bathroom.   If the urge gets suddenly stronger on the way, you may stop again and relax to regain control.  

## 2018-01-09 NOTE — Therapy (Addendum)
[facility] [address] [place], [place], [address] Phone: [phone]   Fax:  [phone]  Physical Therapy Evaluation  Patient Details  Name: Cynthia [name] MRN: [id number] Date of Birth: 1945/12/22 Referring Provider: Lavonia Drafts   Encounter Date: 01/09/2018  PT End of Session - 01/09/18 1012    Visit Number  1    Date for PT Re-Evaluation  04/03/18    Authorization Type  medicare    PT Start Time  0930    PT Stop Time  1008    PT Time Calculation (min)  38 min    Activity Tolerance  Patient tolerated treatment well    Behavior During Therapy  [facility] for tasks assessed/performed       Past Medical History:  Diagnosis Date  . Allergy   . Anxiety   . Arthritis   . Blood transfusion without reported diagnosis   . Constipation, chronic   . DEGENERATIVE JOINT DISEASE 06/02/2010  . Depression   . Glaucoma 2014   Right eye  . Headache(784.0)   . Hyperlipidemia    hx of  . Osteopenia   . Psoriasis   . PUD (peptic ulcer disease) 2012   Upper GI bleed d/t NSAIDs  . RLS (restless legs syndrome)    robaxin prn    Past Surgical History:  Procedure Laterality Date  . ANKLE FRACTURE SURGERY Left    surgery 12-11 (LEFT)  . CESAREAN SECTION  1969    There were no vitals filed for this visit.   Subjective Assessment - 01/09/18 0935    Subjective  Pt has pain in her pubic bone.  It feels like it comes and goes.  Pt states it hurts usually when bladder is full or when constipated.  I sit a lot    Patient Stated Goals  learn ways to manage pain    Currently in Pain?  Yes    Pain Score  5    7/10 when full bladder or constipated   Pain Location  Abdomen    Pain Orientation  Lower    Pain Descriptors / Indicators  Aching    Pain Type  Acute pain    Pain Onset  More than a month ago    Pain Frequency  Intermittent         [facility] PT Assessment - 01/09/18 0001      Assessment   Medical  Diagnosis  M70.70 (ICD-10-CM) - Bursitis of pelvic region, unspecified laterality    Referring Provider  Lavonia Drafts    Onset Date/Surgical Date  --   6 months   Prior Therapy  No      Precautions   Precautions  None      Restrictions   Weight Bearing Restrictions  No      Balance Screen   Has the patient fallen in the past 6 months  No      [place] residence    Living Arrangements  Spouse/significant other      Prior Function   Level of Independence  Independent    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     Cognition   Overall Cognitive Status  Within Functional Limits for tasks assessed      Posture/Postural Control   Posture/Postural Control  Postural limitations    Postural Limitations  Rounded Shoulders      ROM / Strength   AROM / PROM /  Strength  Strength;PROM      PROM   Overall PROM Comments  hip IR limited       Strength   Overall Strength Comments  bilateral hip strength 4+/5      Flexibility   Soft Tissue Assessment /Muscle Length  yes    Hamstrings  40% limited bilaterally      Palpation   SI assessment   anterior rotation Rt ilium    Palpation comment  tender to palpation at pubic symphasis, fascial restrictions throughout abdomen Rt>Lt      Ambulation/Gait   Gait Pattern  Decreased stride length;Trunk flexed                Objective measurements completed on examination: See above findings.    Pelvic Floor Special Questions - 01/09/18 0001    Prior Pelvic/Prostate Exam  Yes    Are you Pregnant or attempting pregnancy?  No    Prior Pregnancies  Yes    Number of Pregnancies  2    Number of C-Sections  1    Currently Sexually Active  No    Urinary Leakage  No    Urinary urgency  Yes    Urinary frequency  1-2 hours   2-3x/night   Fecal incontinence  No   constipation   Fluid intake  do a good job of fluid    Falling out feeling (prolapse)  Yes    Activities that cause feeling  of prolapse  just feels like I have to keep things in    Skin Integrity  Intact;Erthema;Irritaion present at    Skin Integrity Irritation Present at  labia    Perineal Body/Introitus   Descended    Prolapse  Uterine    Pelvic Floor Internal Exam  pt informed and consent given to perform internal soft tissue assessment    Exam Type  Vaginal    Palpation  tender to pubococcygeus Lt>Rt, tight levator ani muscle    Strength  fair squeeze, definite lift    Strength # of reps  1    Strength # of seconds  5    Tone  low       [facility] Adult PT Treatment/Exercise - 01/09/18 0001      Self-Care   Self-Care  Other Self-Care Comments    Other Self-Care Comments   toileting techniques      Exercises   Exercises  Other Exercises    Other Exercises   intitial HEP as seen in chart               PT Short Term Goals - 01/09/18 1027      PT SHORT TERM GOAL #1   Title  pt will be independent with toileting techniques so she has 50% less bearing down during BM    Time  6    Period  Weeks    Status  New    Target Date  02/20/18        PT Long Term Goals - 01/09/18 1022      PT LONG TERM GOAL #1   Title  pt will be ind with HEP and able to manage her pain    Time  12    Period  Weeks    Status  New    Target Date  04/03/18      PT LONG TERM GOAL #2   Title  pt will report 50% less pubic pain    Time  12  Period  Weeks    Status  New    Target Date  04/03/18      PT LONG TERM GOAL #3   Title  Pt will be able to sustain 3/5 contraction for 10 sec in order to maintain adequate pelvic support during functional activities.    Time  12    Period  Weeks    Status  New    Target Date  04/03/18             Plan - 01/09/18 1015    Clinical Impression Statement  Pt presents to skilled PT due to pubic pain that has been ongoing over the last 6 months.  Pt has urinray urgency and constipation that seems to exaccerbate the pain.  Pt demonstrates impaired flexibilty of  hamstrings and tight levator ani.  Pt has MMT of pelvic floor of 3/5 and able to hold for 5 sec.  Pt has uterine prolapse noticed in supine.  Pt has very tender pubococcygeus Left>Rt.  Pt has abdominal adhesions throughout.  Pt demonstrates decreased muscle lengthening with bulging.  She also has a lot of tenderness at pubic syphasis.  Due to her situation pt may not be able to come to many appointments.  She was educated in exercises and toileting techniques in order to reduce excess for in pelvic region.  Pt will benefit from skilled PT as able in order to address impairments.    History and Personal Factors relevant to plan of care:  history of c-section, caregiver to husband and in high stress situation    Clinical Presentation  Stable    Clinical Presentation due to:  pt is stable    Clinical Decision Making  Moderate    Rehab Potential  Good    PT Frequency  Biweekly    PT Duration  12 weeks    PT Treatment/Interventions  ADLs/Self Care Home Management;Biofeedback;Cryotherapy;Electrical Stimulation;Iontophoresis 62m/ml Dexamethasone;Moist Heat;Therapeutic activities;Therapeutic exercise;Neuromuscular re-education;Patient/family education;Manual techniques;Dry needling;Passive range of motion;Taping    PT Next Visit Plan  abdominal fascial release, review initial HEP, biofeedback    PT Home Exercise Plan   Access Code: [email or web address]     Recommended Other Services  eval 8/28    Consulted and Agree with Plan of Care  Patient       Patient will benefit from skilled therapeutic intervention in order to improve the following deficits and impairments:  Pain, Increased fascial restricitons  Visit Diagnosis: Muscle weakness (generalized)  Other muscle spasm     Problem List Patient Active Problem List   Diagnosis Date Noted  . Essential tremor 07/25/2017  . Lichen 064/68/0321 . PCP NOTES >>>>>>>>>>>>>>>>>>>>>>>>>>>>>>>>>>> 03/25/2015  . H/O adenomatous polyp of colon 05/18/2014  . Annual  physical exam 03/13/2011  . Osteopenia   . Osteoarthritis 06/02/2010  . Hyperlipidemia 11/09/2009  . Migraine headache 06/28/2009  . CONSTIPATION, CHRONIC 02/01/2007  . Anxiety and depression 08/17/2006  . ALLERGIC RHINITIS 08/17/2006    JZannie Cove PT 01/09/2018, 3:47 PM  [facility] [address] [place] [address]Phone: [phone]  Fax:  [phone] Name: Cynthia TERWILLIGERMRN: [id number] of Birth: [birth year]  PHYSICAL THERAPY DISCHARGE SUMMARY  Visits from Start of Care: 1  Current functional level related to goals / functional outcomes: See above, only came to eval   Remaining deficits: See above   Education / Equipment: See above education provided  Plan: Patient agrees to discharge.  Patient goals  were not met. Patient is being discharged due to not returning since the last visit.  ?????    [employer], PT 03/20/18 4:53 PM

## 2018-01-21 ENCOUNTER — Encounter: Payer: Self-pay | Admitting: Family Medicine

## 2018-01-21 ENCOUNTER — Ambulatory Visit: Payer: Medicare Other | Admitting: Family Medicine

## 2018-01-21 VITALS — BP 122/76 | HR 69 | Temp 98.2°F | Ht 65.0 in | Wt 160.6 lb

## 2018-01-21 DIAGNOSIS — K529 Noninfective gastroenteritis and colitis, unspecified: Secondary | ICD-10-CM | POA: Diagnosis not present

## 2018-01-21 MED ORDER — ONDANSETRON HCL 4 MG PO TABS: 4.0000 mg | ORAL_TABLET | Freq: Three times a day (TID) | ORAL | 0 refills | Status: DC | PRN

## 2018-01-21 NOTE — Assessment & Plan Note (Signed)
Gastroenteritis, likely viral in origin.  Exam reassuring and symptoms seem to be improving Rx for zofran as needed for nausea Follow bland diet Remain well hydrated Differential includes diverticulitis as well, however does not localize pain and symptoms seem to be improving spontaneously.  Discussed to call back for worsening symptoms.

## 2018-01-21 NOTE — Patient Instructions (Signed)

## 2018-01-21 NOTE — Progress Notes (Signed)
KASANDRA FEHR - 72 y.o. female MRN [id number]  Date of birth: July 11, 1945  Subjective Chief Complaint  Patient presents with  . Nausea    pt. reports having nausea since this past Friday & diarrhea most of the weekend as well; no episodes of vomiting; last bowel movement was Sunday, which was green & black, soft consistency    HPI  LEIGHTYN CINA is a 72 y.o. female here today for acute visit with complaint of nausea with diarrhea.  She reports that symptoms began about 3 days ago.  Some intermittent sharp pain throughout the abdomen but nothing consistent.  She thinks she may have had chills one night but has not checked her temp.  Feeling a little better today.  Had a soft BM yesterday and has not had a BM yet today.  Stool yesterday was greenish/dark.  She denies blood in her stool, lightheadedness, vomiting, rash, shortness of breath, epigastric or RUQ pain.   ROS:  A comprehensive ROS was completed and negative except as noted per HPI.   Allergies  Allergen Reactions  . Motrin [Ibuprofen]     GI bleed  . Pollen Extract   . Statins     "delibitating pain all over body"  . Sulfonamide Derivatives     Pt unsure of reaction  . Ciprofloxacin Swelling and Rash    Past Medical History:  Diagnosis Date  . Allergy   . Anxiety   . Arthritis   . Blood transfusion without reported diagnosis   . Constipation, chronic   . DEGENERATIVE JOINT DISEASE 06/02/2010  . Depression   . Glaucoma 2014   Right eye  . Headache(784.0)   . Hyperlipidemia    hx of  . Osteopenia   . Psoriasis   . PUD (peptic ulcer disease) 2012   Upper GI bleed d/t NSAIDs  . RLS (restless legs syndrome)    robaxin prn    Past Surgical History:  Procedure Laterality Date  . ANKLE FRACTURE SURGERY Left    surgery 12-11 (LEFT)  . [place]    Social History   Socioeconomic History  . Marital status: Married    Spouse name: Not on file  . Number of children: 1  . Years of education: Not on  file  . Highest education level: Not on file  Occupational History  . Occupation: retired VP of Tax inspector: RETIRED  Social Needs  . Financial resource strain: Not on file  . Food insecurity:    Worry: Not on file    Inability: Not on file  . Transportation needs:    Medical: Not on file    Non-medical: Not on file  Tobacco Use  . Smoking status: Never Smoker  . Smokeless tobacco: Never Used  Substance and Sexual Activity  . Alcohol use: Yes    Comment: socially  . Drug use: No  . Sexual activity: Not on file  Lifestyle  . Physical activity:    Days per week: Not on file    Minutes per session: Not on file  . Stress: Not on file  Relationships  . Social connections:    Talks on phone: Not on file    Gets together: Not on file    Attends religious service: Not on file    Active member of club or organization: Not on file    Attends meetings of clubs or organizations: Not on file    Relationship status: Not on file  Other Topics Concern  . Not on file  Social History Narrative   2 step children, 1 child , 7 GK   Household- patient and her husband, he recently stop driving      Family History  Problem Relation Age of Onset  . Aneurysm Mother        brain  . Celiac disease Brother   . Diabetes Maternal Grandfather   . Hypertension Neg Hx   . Breast cancer Neg Hx   . Coronary artery disease Neg Hx   . Colon cancer Neg Hx   . Esophageal cancer Neg Hx   . Stomach cancer Neg Hx   . Rectal cancer Neg Hx     Health Maintenance  Topic Date Due  . INFLUENZA VACCINE  12/13/2017  . MAMMOGRAM  11/07/2018  . COLONOSCOPY  12/18/2018  . TETANUS/TDAP  02/28/2023  . DEXA SCAN  Completed  . Hepatitis C Screening  Completed  . PNA vac Low Risk Adult  Completed     ----------------------------------------------------------------------------------------------------------------------------------------------------------------------------------------------------------------- Physical Exam BP 122/76 (BP Location: Left Arm, Cuff Size: Normal)   Pulse 69   Temp 98.2 F (36.8 C) (Oral)   Ht 5\' 5"  (1.651 m)   Wt 160 lb 9.6 oz (72.8 kg)   SpO2 96%   BMI 26.73 kg/m   Physical Exam  Constitutional: She is oriented to person, place, and time. She appears well-nourished. No distress.  HENT:  Head: Normocephalic and atraumatic.  Eyes: No scleral icterus.  Neck: Neck supple. No thyromegaly present.  Cardiovascular: Normal rate, regular rhythm and normal heart sounds.  Pulmonary/Chest: Effort normal and breath sounds normal.  Abdominal: Soft. Bowel sounds are normal. She exhibits no distension. There is tenderness (mild ttp along lower abdomen.  Negative peitoneal signs including rebound, psoas test and heel percussion. ). There is no guarding.  Lymphadenopathy:    She has no cervical adenopathy.  Neurological: She is alert and oriented to person, place, and time. No cranial nerve deficit.  Skin: Skin is warm and dry. No rash noted.  Psychiatric: She has a normal mood and affect. Her behavior is normal.    ------------------------------------------------------------------------------------------------------------------------------------------------------------------------------------------------------------------- Assessment and Plan  Gastroenteritis Gastroenteritis, likely viral in origin.  Exam reassuring and symptoms seem to be improving Rx for zofran as needed for nausea Follow bland diet Remain well hydrated Differential includes diverticulitis as well, however does not localize pain and symptoms seem to be improving spontaneously.  Discussed to call back for worsening symptoms.

## 2018-02-13 ENCOUNTER — Telehealth: Payer: Self-pay | Admitting: Internal Medicine

## 2018-02-14 NOTE — Telephone Encounter (Signed)
Sent!

## 2018-02-14 NOTE — Telephone Encounter (Signed)
Pt is requesting refill on Ambien.   Last OV: 11/29/2017 Last Fill: 07/16/2017 #30 and 5RF UDS: Not needed for Ambien per PCP  NCCR printed- no discrepancies noted- sent for scanning

## 2018-03-03 ENCOUNTER — Other Ambulatory Visit: Payer: Self-pay | Admitting: Internal Medicine

## 2018-03-06 ENCOUNTER — Encounter: Payer: Self-pay | Admitting: Internal Medicine

## 2018-03-19 ENCOUNTER — Ambulatory Visit (INDEPENDENT_AMBULATORY_CARE_PROVIDER_SITE_OTHER): Payer: Medicare Other

## 2018-03-19 DIAGNOSIS — Z23 Encounter for immunization: Secondary | ICD-10-CM | POA: Diagnosis not present

## 2018-03-29 ENCOUNTER — Ambulatory Visit: Payer: Medicare Other | Admitting: Family Medicine

## 2018-03-29 ENCOUNTER — Encounter: Payer: Self-pay | Admitting: Family Medicine

## 2018-03-29 DIAGNOSIS — B349 Viral infection, unspecified: Secondary | ICD-10-CM | POA: Insufficient documentation

## 2018-03-29 NOTE — Progress Notes (Signed)
Cynthia [name] - 72 y.o. female MRN [id number]  Date of birth: 12/23/1945  Subjective Chief Complaint  Patient presents with  . Cough  . Sore Throat    HPI Cynthia [name] is a 72 y.o. female who complains of congestion, sore throat, post nasal drip, dry cough and diarrhea.  Symptoms initially started about 6 days ago and have been improving.  Still has mild sore throat.  Using mucinex DM for treatment which is helpful.  She denies chest pain, shortness of breath, fever, chills, nausea or vomiting, body aches or sinus pain.   ROS:  A comprehensive ROS was completed and negative except as noted per HPI    Allergies  Allergen Reactions  . Motrin [Ibuprofen]     GI bleed  . Pollen Extract   . Statins     "delibitating pain all over body"  . Sulfonamide Derivatives     Pt unsure of reaction  . Ciprofloxacin Swelling and Rash    Past Medical History:  Diagnosis Date  . Allergy   . Anxiety   . Arthritis   . Blood transfusion without reported diagnosis   . Constipation, chronic   . DEGENERATIVE JOINT DISEASE 06/02/2010  . Depression   . Glaucoma 2014   Right eye  . Headache(784.0)   . Hyperlipidemia    hx of  . Osteopenia   . Psoriasis   . PUD (peptic ulcer disease) 2012   Upper GI bleed d/t NSAIDs  . RLS (restless legs syndrome)    robaxin prn    Past Surgical History:  Procedure Laterality Date  . ANKLE FRACTURE SURGERY Left    surgery 12-11 (LEFT)  . [place]    Social History   Socioeconomic History  . Marital status: Married    Spouse name: Not on file  . Number of children: 1  . Years of education: Not on file  . Highest education level: Not on file  Occupational History  . Occupation: retired VP of Tax inspector: RETIRED  Social Needs  . Financial resource strain: Not on file  . Food insecurity:    Worry: Not on file    Inability: Not on file  . Transportation needs:    Medical: Not on file    Non-medical: Not on file   Tobacco Use  . Smoking status: Never Smoker  . Smokeless tobacco: Never Used  Substance and Sexual Activity  . Alcohol use: Yes    Comment: socially  . Drug use: No  . Sexual activity: Not on file  Lifestyle  . Physical activity:    Days per week: Not on file    Minutes per session: Not on file  . Stress: Not on file  Relationships  . Social connections:    Talks on phone: Not on file    Gets together: Not on file    Attends religious service: Not on file    Active member of club or organization: Not on file    Attends meetings of clubs or organizations: Not on file    Relationship status: Not on file  Other Topics Concern  . Not on file  Social History Narrative   2 step children, 1 child , 7 GK   Household- patient and her husband, he recently stop driving      Family History  Problem Relation Age of Onset  . Aneurysm Mother        brain  . Celiac disease Brother   .  Diabetes Maternal Grandfather   . Hypertension Neg Hx   . Breast cancer Neg Hx   . Coronary artery disease Neg Hx   . Colon cancer Neg Hx   . Esophageal cancer Neg Hx   . Stomach cancer Neg Hx   . Rectal cancer Neg Hx     Health Maintenance  Topic Date Due  . MAMMOGRAM  11/07/2018  . COLONOSCOPY  12/18/2018  . TETANUS/TDAP  02/28/2023  . INFLUENZA VACCINE  Completed  . DEXA SCAN  Completed  . Hepatitis C Screening  Completed  . PNA vac Low Risk Adult  Completed    ----------------------------------------------------------------------------------------------------------------------------------------------------------------------------------------------------------------- Physical Exam BP 120/80   Pulse 79   Temp 98.4 F (36.9 C) (Oral)   Wt 162 lb 3.2 oz (73.6 kg)   SpO2 97%   BMI 26.99 kg/m   Physical Exam  Constitutional: She is oriented to person, place, and time. She appears well-nourished. No distress.  HENT:  Head: Normocephalic and atraumatic.  Mouth/Throat: Oropharynx is  clear and moist.  Eyes: No scleral icterus.  Neck: Neck supple. No thyromegaly present.  Cardiovascular: Normal rate, regular rhythm and normal heart sounds.  Pulmonary/Chest: Effort normal and breath sounds normal.  Lymphadenopathy:    She has no cervical adenopathy.  Neurological: She is alert and oriented to person, place, and time.  Skin: Skin is warm and dry.  Psychiatric: She has a normal mood and affect. Her behavior is normal.    ------------------------------------------------------------------------------------------------------------------------------------------------------------------------------------------------------------------- Assessment and Plan  Viral syndrome -Symptoms improving at this time.  -Recommend continued supportive care -May continued mucinex DM -F/u PRN

## 2018-03-29 NOTE — Patient Instructions (Signed)
Upper Respiratory Infection, Adult Most upper respiratory infections (URIs) are caused by a virus. A URI affects the nose, throat, and upper air passages. The most common type of URI is often called "the common cold." Follow these instructions at home:  Take medicines only as told by your doctor.  Gargle warm saltwater or take cough drops to comfort your throat as told by your doctor.  Use a warm mist humidifier or inhale steam from a shower to increase air moisture. This may make it easier to breathe.  Drink enough fluid to keep your pee (urine) clear or pale yellow.  Eat soups and other clear broths.  Have a healthy diet.  Rest as needed.  Go back to work when your fever is gone or your doctor says it is okay. ? You may need to stay home longer to avoid giving your URI to others. ? You can also wear a face mask and wash your hands often to prevent spread of the virus.  Use your inhaler more if you have asthma.  Do not use any tobacco products, including cigarettes, chewing tobacco, or electronic cigarettes. If you need help quitting, ask your doctor. Contact a doctor if:  You are getting worse, not better.  Your symptoms are not helped by medicine.  You have chills.  You are getting more short of breath.  You have brown or red mucus.  You have yellow or brown discharge from your nose.  You have pain in your face, especially when you bend forward.  You have a fever.  You have puffy (swollen) neck glands.  You have pain while swallowing.  You have white areas in the back of your throat. Get help right away if:  You have very bad or constant: ? Headache. ? Ear pain. ? Pain in your forehead, behind your eyes, and over your cheekbones (sinus pain). ? Chest pain.  You have long-lasting (chronic) lung disease and any of the following: ? Wheezing. ? Long-lasting cough. ? Coughing up blood. ? A change in your usual mucus.  You have a stiff neck.  You have  changes in your: ? Vision. ? Hearing. ? Thinking. ? Mood. This information is not intended to replace advice given to you by your health care provider. Make sure you discuss any questions you have with your health care provider. Document Released: 10/18/2007 Document Revised: 01/02/2016 Document Reviewed: 08/06/2013 Elsevier Interactive Patient Education  2018 [employer]  

## 2018-03-29 NOTE — Assessment & Plan Note (Signed)
-  Symptoms improving at this time.  -Recommend continued supportive care -May continued mucinex DM -F/u PRN

## 2018-03-29 NOTE — Progress Notes (Signed)
Cynthia [name] - 72 y.o. female MRN [id number]  Date of birth: 1946-05-04  Subjective Chief Complaint  Patient presents with  . Cough  . Sore Throat    HPI 72 year old presents today for cough that began 11/9. Symptoms began with dry cough that progressed to sore throat and diarrhea. She has been taking Mucinex DM and reports coughing up small amounts of clear or yellow sputum. She denies fever or chills. She reports improvement in symptoms over the last two days but wanted to be seen before the weekend.   Allergies  Allergen Reactions  . Motrin [Ibuprofen]     GI bleed  . Pollen Extract   . Statins     "delibitating pain all over body"  . Sulfonamide Derivatives     Pt unsure of reaction  . Ciprofloxacin Swelling and Rash    Past Medical History:  Diagnosis Date  . Allergy   . Anxiety   . Arthritis   . Blood transfusion without reported diagnosis   . Constipation, chronic   . DEGENERATIVE JOINT DISEASE 06/02/2010  . Depression   . Glaucoma 2014   Right eye  . Headache(784.0)   . Hyperlipidemia    hx of  . Osteopenia   . Psoriasis   . PUD (peptic ulcer disease) 2012   Upper GI bleed d/t NSAIDs  . RLS (restless legs syndrome)    robaxin prn    Past Surgical History:  Procedure Laterality Date  . ANKLE FRACTURE SURGERY Left    surgery 12-11 (LEFT)  . [place]    Social History   Socioeconomic History  . Marital status: Married    Spouse name: Not on file  . Number of children: 1  . Years of education: Not on file  . Highest education level: Not on file  Occupational History  . Occupation: retired VP of Tax inspector: RETIRED  Social Needs  . Financial resource strain: Not on file  . Food insecurity:    Worry: Not on file    Inability: Not on file  . Transportation needs:    Medical: Not on file    Non-medical: Not on file  Tobacco Use  . Smoking status: Never Smoker  . Smokeless tobacco: Never Used  Substance and Sexual  Activity  . Alcohol use: Yes    Comment: socially  . Drug use: No  . Sexual activity: Not on file  Lifestyle  . Physical activity:    Days per week: Not on file    Minutes per session: Not on file  . Stress: Not on file  Relationships  . Social connections:    Talks on phone: Not on file    Gets together: Not on file    Attends religious service: Not on file    Active member of club or organization: Not on file    Attends meetings of clubs or organizations: Not on file    Relationship status: Not on file  Other Topics Concern  . Not on file  Social History Narrative   2 step children, 1 child , 7 GK   Household- patient and her husband, he recently stop driving      Family History  Problem Relation Age of Onset  . Aneurysm Mother        brain  . Celiac disease Brother   . Diabetes Maternal Grandfather   . Hypertension Neg Hx   . Breast cancer Neg Hx   .  Coronary artery disease Neg Hx   . Colon cancer Neg Hx   . Esophageal cancer Neg Hx   . Stomach cancer Neg Hx   . Rectal cancer Neg Hx     Health Maintenance  Topic Date Due  . MAMMOGRAM  11/07/2018  . COLONOSCOPY  12/18/2018  . TETANUS/TDAP  02/28/2023  . INFLUENZA VACCINE  Completed  . DEXA SCAN  Completed  . Hepatitis C Screening  Completed  . PNA vac Low Risk Adult  Completed   ROS General: Patient reports fatigue that has started improving over the last two days.  HEENT: Patient reports mild nasal congestion and sore throat.  Cardiac: Patient denies chest pain, pressure, or palpitations.  Pulmonary: Pt denies shortness of breath or dyspnea.    ----------------------------------------------------------------------------------------------------------------------------------------------------------------------------------------------------------------- Physical Exam BP 120/80   Pulse 79   Temp 98.4 F (36.9 C) (Oral)   Wt 162 lb 3.2 oz (73.6 kg)   SpO2 97%   BMI 26.99 kg/m   Physical Exam    Constitutional: She is oriented to person, place, and time. She appears well-developed and well-nourished.  HENT:  Right Ear: Tympanic membrane and ear canal normal.  Left Ear: Tympanic membrane and ear canal normal.  Nose: Right sinus exhibits no frontal sinus tenderness. Left sinus exhibits no frontal sinus tenderness.  Mouth/Throat: Oropharynx is clear and moist. No oropharyngeal exudate.  Mild oropharyngeal erythema. Mild maxillary sinus tenderness.   Cardiovascular: Normal rate, regular rhythm and normal heart sounds.  Pulmonary/Chest: Effort normal and breath sounds normal.  Lymphadenopathy:    She has no cervical adenopathy.  Neurological: She is alert and oriented to person, place, and time.  Skin: Skin is warm and dry.    ------------------------------------------------------------------------------------------------------------------------------------------------------------------------------------------------------------------- Assessment and Plan   Viral Upper Respiratory Infection -Continue taking Mucinex DM -Continue increasing fluids -May use humidifier at night to help with dryness -Follow Up: Return if symptoms do not improve or worsen. Warning signs reviewed.

## 2018-05-22 ENCOUNTER — Ambulatory Visit: Payer: Medicare Other | Admitting: Neurology

## 2018-05-26 ENCOUNTER — Other Ambulatory Visit: Payer: Self-pay | Admitting: Neurology

## 2018-06-17 ENCOUNTER — Encounter: Payer: Self-pay | Admitting: Neurology

## 2018-06-17 ENCOUNTER — Ambulatory Visit: Payer: Medicare Other | Admitting: Neurology

## 2018-06-17 VITALS — BP 106/74 | HR 78 | Ht 65.0 in | Wt 162.0 lb

## 2018-06-17 DIAGNOSIS — G4481 Hypnic headache: Secondary | ICD-10-CM

## 2018-06-17 NOTE — Progress Notes (Signed)
NEUROLOGY FOLLOW UP OFFICE NOTE  Cynthia GIELOW [id number]  HISTORY OF PRESENT ILLNESS: Cynthia [name] is a 73 year old right-handed woman with depression, glaucoma and degenerative joint disease who follows up for hypnic headache.  UPDATE: She tried one cup of black coffee at night (100mg  to 200mg  caffeine) and melatonin 5mg , which was ineffective. She started taking 1/4 of a sumatriptan pill (25mg ) every evening and no headaches.  Since starting this regimen, she had only 2 headaches over the past month.  Intensity:  7-8/10 Duration:  1 hour with sumatriptan Frequency:  2 over past 30 days. Frequency of abortive medication: 25mg  sumatriptan every evening.   Current NSAIDS:  no Current analgesics:  no Current triptans:  Sumatriptan 100mg  Current ergotamine:  no Current anti-emetic:  no Current muscle relaxants:  Robaxin 500mg  TID Current anti-anxiolytic:  no Current sleep aide:  Ambien Current Antihypertensive medications:  no Current Antidepressant medications:  Sertraline 100mg , bupropion 300mg  Current Anticonvulsant medications:  no Current anti-CGRP:  no Current Vitamins/Herbal/Supplements:  B complex, Mg Current Antihistamines/Decongestants:  Mucinex, Astelin Other therapy:  Black coffee in evening, melatonin 5mg   Depression and anxiety: Yes Other pain: Joint pain  HISTORY:  She started having headaches infrequently in 2015 but progressively became more frequent in 2016.Varies, but they are usually left sided, involving the ear, but also may be right sided (involving the sinuses) or in band-like distribution.It is both throbbing and non-throbbing.Initially, it is usually 5/10 but severe episodes are 7-8/10.Sometimes there is nausea.There is phonophobia.There is no associated photophobia, visual disturbance or unilateral numbness or weakness.Initially, it lasts a couple of hours and occuring 15 days out of the month (3 to 4 days severe).It wakes her up at  4:30 to 5 AM.  She reports sensation of congestion but no runny nose.CT of sinuses showed opacified right sphenoid sinus and ethmoid air cell.She was treated a couple of times with Augmentin and prednisone, which was effective for a while but headaches then returned.She cannot think of a trigger.  They are worse during the summer.  There are no aggravating or relieving factors.  MRI of brain from 06/10/15 was normal. Sed Rate from 12/27/15 was 10.  Past NSAIDS:  ibuprofen (ulcers) Past analgesics:  Tylenol Past abortive triptans:  no Past abortive ergotamine:  no Past muscle relaxants:  no Past anti-emetic:  no Past antihypertensive medications:  no Past antidepressant medications:  Nortriptyline 50mg  Past anticonvulsant medications:  topiramate 25mg  (concerned about glaucoma) Past anti-CGRP:  no Past vitamins/Herbal/Supplements:  no Past antihistamines/decongestants:  no Other past therapies:  no  PAST MEDICAL HISTORY: Past Medical History:  Diagnosis Date  . Allergy   . Anxiety   . Arthritis   . Blood transfusion without reported diagnosis   . Constipation, chronic   . DEGENERATIVE JOINT DISEASE 06/02/2010  . Depression   . Glaucoma 2014   Right eye  . Headache(784.0)   . Hyperlipidemia    hx of  . Osteopenia   . Psoriasis   . PUD (peptic ulcer disease) 2012   Upper GI bleed d/t NSAIDs  . RLS (restless legs syndrome)    robaxin prn    MEDICATIONS: Current Outpatient Medications on File Prior to Visit  Medication Sig Dispense Refill  . acetaminophen (TYLENOL) 325 MG tablet Take 650 mg by mouth as needed.    Marland Kitchen azelastine (ASTELIN) 0.1 % nasal spray Place 2 sprays into both nostrils at bedtime. 30 mL 6  . B COMPLEX VITAMINS PO Take by mouth daily.    Marland Kitchen  Bepotastine Besilate (BEPREVE) 1.5 % SOLN Place 1 drop into both eyes 2 (two) times daily.    Marland Kitchen buPROPion (WELLBUTRIN XL) 300 MG 24 hr tablet Take 1 tablet (300 mg total) by mouth daily. 90 tablet 2  . calcipotriene  (DOVONOX) 0.005 % cream     . cetirizine (ZYRTEC) 10 MG tablet Take 10 mg by mouth daily.      . Cholecalciferol (VITAMIN D-400 PO) Take by mouth.      . clobetasol (TEMOVATE) 0.05 % external solution Apply 1 application topically 2 (two) times daily as needed.     . clotrimazole-betamethasone (LOTRISONE) cream Apply to affected area twice a day until clear 120 g 0  . guaiFENesin (MUCINEX) 600 MG 12 hr tablet Take by mouth as needed.    . methocarbamol (ROBAXIN) 500 MG tablet Take 1 tablet (500 mg total) by mouth 3 (three) times daily as needed (RLS). 90 tablet 3  . miconazole (MONISTAT 7) 2 % vaginal cream Place 1 Applicatorful vaginally at bedtime. Apply for seven nights 30 g 2  . Omega-3 Fatty Acids (FISH OIL) 1000 MG CPDR Take by mouth.      Marland Kitchen omeprazole (PRILOSEC) 20 MG capsule Take 1 capsule (20 mg total) by mouth daily. 90 capsule 3  . ondansetron (ZOFRAN) 4 MG tablet Take 1 tablet (4 mg total) by mouth every 8 (eight) hours as needed for nausea or vomiting. (Patient not taking: Reported on 03/29/2018) 20 tablet 0  . polyethylene glycol (MIRALAX / GLYCOLAX) packet Take 17 g by mouth daily as needed.    . sertraline (ZOLOFT) 100 MG tablet Take 1 tablet (100 mg total) by mouth daily. 30 tablet 5  . SUMAtriptan (IMITREX) 100 MG tablet TAKE 1 TABLET BY MOUTH AT EARLIEST ONSET OF HEADACHE. MAY REPEAT ONCE IN 2 HOURS IF HEADACHE PERSISTS OR RECURS. 10 tablet 2  . triamcinolone cream (KENALOG) 0.1 % Apply 1 application topically as needed.     . zolpidem (AMBIEN) 5 MG tablet TAKE ONE TABLET BY MOUTH AT BEDTIME AS NEEDED FOR SLEEP 30 tablet 4   No current facility-administered medications on file prior to visit.     ALLERGIES: Allergies  Allergen Reactions  . Motrin [Ibuprofen]     GI bleed  . Pollen Extract   . Statins     "delibitating pain all over body"  . Sulfonamide Derivatives     Pt unsure of reaction  . Ciprofloxacin Swelling and Rash    FAMILY HISTORY: Family History    Problem Relation Age of Onset  . Aneurysm Mother        brain  . Celiac disease Brother   . Diabetes Maternal Grandfather   . Hypertension Neg Hx   . Breast cancer Neg Hx   . Coronary artery disease Neg Hx   . Colon cancer Neg Hx   . Esophageal cancer Neg Hx   . Stomach cancer Neg Hx   . Rectal cancer Neg Hx    SOCIAL HISTORY: Social History   Socioeconomic History  . Marital status: Married    Spouse name: Not on file  . Number of children: 1  . Years of education: Not on file  . Highest education level: Not on file  Occupational History  . Occupation: retired VP of Tax inspector: RETIRED  Social Needs  . Financial resource strain: Not on file  . Food insecurity:    Worry: Not on file    Inability: Not on file  .  Transportation needs:    Medical: Not on file    Non-medical: Not on file  Tobacco Use  . Smoking status: Never Smoker  . Smokeless tobacco: Never Used  Substance and Sexual Activity  . Alcohol use: Yes    Comment: socially  . Drug use: No  . Sexual activity: Not on file  Lifestyle  . Physical activity:    Days per week: Not on file    Minutes per session: Not on file  . Stress: Not on file  Relationships  . Social connections:    Talks on phone: Not on file    Gets together: Not on file    Attends religious service: Not on file    Active member of club or organization: Not on file    Attends meetings of clubs or organizations: Not on file    Relationship status: Not on file  . Intimate partner violence:    Fear of current or ex partner: Not on file    Emotionally abused: Not on file    Physically abused: Not on file    Forced sexual activity: Not on file  Other Topics Concern  . Not on file  Social History Narrative   2 step children, 1 child , [place]- patient and her husband, he recently stop driving      REVIEW OF SYSTEMS: Constitutional: No fevers, chills, or sweats, no generalized fatigue, change in  appetite Eyes: No visual changes, double vision, eye pain Ear, nose and throat: No hearing loss, ear pain, nasal congestion, sore throat Cardiovascular: No chest pain, palpitations Respiratory:  No shortness of breath at rest or with exertion, wheezes GastrointestinaI: No nausea, vomiting, diarrhea, abdominal pain, fecal incontinence Genitourinary:  No dysuria, urinary retention or frequency Musculoskeletal:  No neck pain, back pain Integumentary: No rash, pruritus, skin lesions Neurological: as above Psychiatric: No depression, insomnia, anxiety Endocrine: No palpitations, fatigue, diaphoresis, mood swings, change in appetite, change in weight, increased thirst Hematologic/Lymphatic:  No purpura, petechiae. Allergic/Immunologic: no itchy/runny eyes, nasal congestion, recent allergic reactions, rashes  PHYSICAL EXAM: Blood pressure 106/74, pulse 78, height 5\' 5"  (1.651 m), weight 162 lb (73.5 kg), SpO2 98 %. General: No acute distress.  Patient appears well-groomed.  Head:  Normocephalic/atraumatic Eyes:  Fundi examined but not visualized Neck: supple, no paraspinal tenderness, full range of motion Heart:  Regular rate and rhythm Lungs:  Clear to auscultation bilaterally Back: No paraspinal tenderness Neurological Exam: alert and oriented to person, place, and time. Attention span and concentration intact, recent and remote memory intact, fund of knowledge intact.  Speech fluent and not dysarthric, language intact.  CN II-XII intact. Bulk and tone normal, muscle strength 5/5 throughout.  Sensation to light touch  intact.  Deep tendon reflexes 2+ throughout.  Finger to nose testing intact.  Gait normal, Romberg negative.  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  Advised to stop daily use of sumatriptan.  Instead, I would like her to drink a cup of coffee with melatonin every evening (increase dose to 10mg ).  She will  contact me in 6 to 8 weeks with update and we can consider an alternative preventative. 2.  For abortive therapy, acetaminophen-caffeine.  Advised to try and avoid sumatriptan if possible (given her age) 46.  Limit use of pain relievers to no more than 2 days out of week to prevent risk of rebound or medication-overuse headache. 4.  Keep headache diary 5.  Exercise, hydration, caffeine cessation, sleep hygiene, monitor for and avoid triggers 6.  Consider:  magnesium citrate 400mg  daily, riboflavin 400mg  daily, and coenzyme Q10 100mg  three times daily 7.  Follow up in 4 months.   Metta Clines, DO  CC: Kathlene November, MD

## 2018-06-17 NOTE — Patient Instructions (Signed)
1.  Start taking 1 cup of strong coffee with melatonin 10mg  every evening.  Contact me in 6-8 weeks if headaches not improved. 2.  Try to not take sumatriptan.  Instead, take acetaminophen-caffeine pill if needed for migraine/headache. 3.  Limit use of pain relievers to no more than 2 days out of week to prevent risk of rebound or medication-overuse headache. 4.  Keep headache diary 5.  Follow up in 4 months.

## 2018-06-24 ENCOUNTER — Other Ambulatory Visit: Payer: Self-pay | Admitting: Internal Medicine

## 2018-07-24 ENCOUNTER — Telehealth: Payer: Self-pay | Admitting: Internal Medicine

## 2018-07-24 NOTE — Telephone Encounter (Signed)
Copied from [place] [id number]. Topic: Quick Communication - See Telephone Encounter >> Jul 24, 2018 11:15 AM Ivar Drape wrote: CRM for notification. See Telephone encounter for: 07/24/18. Wendee Beavers a nurse practitioner w/[facility] [phone] wanted the provider to know that the patient's PAD screening of the rgt foot is 0.90 mild.  The left foot was 0.88 moderate.

## 2018-07-25 ENCOUNTER — Other Ambulatory Visit: Payer: Self-pay | Admitting: Internal Medicine

## 2018-07-26 NOTE — Telephone Encounter (Signed)
Pt is requesting refill on Ambien.   Last OV: 11/29/2017 Last Fill: 02/14/2018 #30 and 4RF UDS: None, PCP doesn't test for Ambien only

## 2018-08-30 ENCOUNTER — Telehealth: Payer: Self-pay | Admitting: Internal Medicine

## 2018-08-30 NOTE — Telephone Encounter (Signed)
Received a document from [place] health alert, apparently the ED with a peripheral artery screening tests, he was moderately positive on the left food. Will order formal ABIs at the next opportunity to rule out PVD

## 2018-09-02 ENCOUNTER — Other Ambulatory Visit: Payer: Self-pay | Admitting: Internal Medicine

## 2018-09-03 ENCOUNTER — Other Ambulatory Visit: Payer: Self-pay

## 2018-09-03 MED ORDER — CLOTRIMAZOLE-BETAMETHASONE 1-0.05 % EX CREA: TOPICAL_CREAM | CUTANEOUS | 0 refills | Status: DC

## 2018-10-15 ENCOUNTER — Encounter: Payer: Self-pay | Admitting: Internal Medicine

## 2018-10-15 NOTE — Progress Notes (Signed)
Virtual Visit via Video Note The purpose of this virtual visit is to provide medical care while limiting exposure to the novel coronavirus.    Consent was obtained for video visit:  Yes.   Answered questions that patient had about telehealth interaction:  Yes.   I discussed the limitations, risks, security and privacy concerns of performing an evaluation and management service by telemedicine. I also discussed with the patient that there may be a patient responsible charge related to this service. The patient expressed understanding and agreed to proceed.  Pt location: Home Physician Location: Home Name of referring provider:  Colon Branch, MD I connected with Cynthia [name] at patients initiation/request on 10/16/2018 at  9:50 AM EDT by video enabled telemedicine application and verified that I am speaking with the correct person using two identifiers. Pt MRN:  [id number] Pt DOB:  04-25-1946 Video Participants:  Cynthia [name]   History of Present Illness:  Cynthia [name] is a 73 year old right-handed woman with depression, glaucoma and degenerative joint disease who follows up for hypnic headache.  UPDATE: In February, she was advised to stop daily use of sumatriptan and instead use acetaminophen-caffeine for abortive headache therapy.  She was advised to increase melatonin and take with 1 cup of black coffee.  She drinks coffee in the day but has not been drinking it at night.  She ran out of melatonin and never refilled it.  She had been doing well, with no headaches, but over the past 2 weeks, she has had 7 or 8 headaches.  She treated them with 25mg  sumatriptan and headaches aborted in 45-60 minutes.   Intensity:  7-8/10 Duration:  1 hour with sumatriptan Frequency:  2 over past 30 days. Frequency of abortive medication: 25mg  sumatriptan every evening.   Current NSAIDS:no Current analgesics:acetaminophen-caffeine Current triptans:Sumatriptan 100mg  Current ergotamine:no  Current anti-emetic:Zofran Current muscle relaxants:Robaxin 500mg TID Current anti-anxiolytic:no Current sleep aide:Ambien Current Antihypertensive medications:no Current Antidepressant medications:Sertraline100mg , bupropion300mg  Current Anticonvulsant medications:no Current anti-CGRP:no Current Vitamins/Herbal/Supplements:B complex, Mg Current Antihistamines/Decongestants:Mucinex, Astelin Other therapy:1 cup black coffee with melatonin 10mg  at bedtime  Depression and anxiety: Yes Other pain: Joint pain  HISTORY:  She started having headaches infrequently in 2015 but progressively became more frequent in 2016.Varies, but they are usually left sided, involving the ear, but also may be right sided (involving the sinuses) or in band-like distribution.It is both throbbing and non-throbbing.Initially, it is usually 5/10 but severe episodes are 7-8/10.Sometimes there is nausea.There is phonophobia.There is noassociatedphotophobia,visual disturbanceor unilateral numbness or weakness.Initially, it lasts a couple of hours and occuring 15 days out of the month (3 to 4 days severe).It wakes her up at 4:30 to 5 AM.  She reports sensation of congestion but no runny nose.CT of sinuses showed opacified right sphenoid sinus and ethmoid air cell.She was treated a couple of times with Augmentin and prednisone, which was effective for a while but headaches then returned.She cannot think of a trigger.  They are worse during the summer.There are no aggravating or relieving factors.MRI of brain from 06/10/15 was normal. Sed Rate from 12/27/15 was 10.  Past NSAIDS: ibuprofen (ulcers) Past analgesics:Tylenol Past abortive triptans:no Past abortive ergotamine:no Past muscle relaxants:no Past anti-emetic:no Past antihypertensive medications:no Past antidepressant medications:Nortriptyline 50mg  Past anticonvulsant medications:topiramate 25mg   (concerned about glaucoma) Past anti-CGRP:no Past vitamins/Herbal/Supplements:no Past antihistamines/decongestants:no Other past therapies:none  Past Medical History: Past Medical History:  Diagnosis Date  . Allergy   . Anxiety   . Arthritis   . Blood transfusion  without reported diagnosis   . Constipation, chronic   . DEGENERATIVE JOINT DISEASE 06/02/2010  . Depression   . Glaucoma 2014   Right eye  . Headache(784.0)   . Hyperlipidemia    hx of  . Osteopenia   . Psoriasis   . PUD (peptic ulcer disease) 2012   Upper GI bleed d/t NSAIDs  . RLS (restless legs syndrome)    robaxin prn    Medications: Outpatient Encounter Medications as of 10/16/2018  Medication Sig Note  . acetaminophen (TYLENOL) 325 MG tablet Take 650 mg by mouth as needed.   Marland Kitchen azelastine (ASTELIN) 0.1 % nasal spray Place 2 sprays into both nostrils at bedtime.   . B COMPLEX VITAMINS PO Take by mouth daily.   . Bepotastine Besilate (BEPREVE) 1.5 % SOLN Place 1 drop into both eyes 2 (two) times daily.   Marland Kitchen buPROPion (WELLBUTRIN XL) 300 MG 24 hr tablet Take 1 tablet (300 mg total) by mouth daily.   . calcipotriene (DOVONOX) 0.005 % cream    . cetirizine (ZYRTEC) 10 MG tablet Take 10 mg by mouth daily.     . Cholecalciferol (VITAMIN D-400 PO) Take by mouth.     . clobetasol (TEMOVATE) 0.05 % external solution Apply 1 application topically 2 (two) times daily as needed.    . clotrimazole-betamethasone (LOTRISONE) cream Apply to affected area twice a day until clear   . guaiFENesin (MUCINEX) 600 MG 12 hr tablet Take by mouth as needed. 01/24/2017: PRN  . methocarbamol (ROBAXIN) 500 MG tablet Take 1 tablet (500 mg total) by mouth 3 (three) times daily as needed.   . miconazole (MONISTAT 7) 2 % vaginal cream Place 1 Applicatorful vaginally at bedtime. Apply for seven nights   . Omega-3 Fatty Acids (FISH OIL) 1000 MG CPDR Take by mouth.     Marland Kitchen omeprazole (PRILOSEC) 20 MG capsule Take 1 capsule (20 mg total) by  mouth daily.   . ondansetron (ZOFRAN) 4 MG tablet Take 1 tablet (4 mg total) by mouth every 8 (eight) hours as needed for nausea or vomiting. (Patient not taking: Reported on 03/29/2018)   . polyethylene glycol (MIRALAX / GLYCOLAX) packet Take 17 g by mouth daily as needed.   . sertraline (ZOLOFT) 100 MG tablet Take 1 tablet (100 mg total) by mouth daily.   . SUMAtriptan (IMITREX) 100 MG tablet TAKE 1 TABLET BY MOUTH AT EARLIEST ONSET OF HEADACHE. MAY REPEAT ONCE IN 2 HOURS IF HEADACHE PERSISTS OR RECURS.   . triamcinolone cream (KENALOG) 0.1 % Apply 1 application topically as needed.    . zolpidem (AMBIEN) 5 MG tablet TAKE ONE TABLET BY MOUTH EVERY NIGHT AT BEDTIME AS NEEDED FOR SLEEP    No facility-administered encounter medications on file as of 10/16/2018.     Allergies: Allergies  Allergen Reactions  . Motrin [Ibuprofen]     GI bleed  . Pollen Extract   . Statins     "delibitating pain all over body"  . Sulfonamide Derivatives     Pt unsure of reaction  . Ciprofloxacin Swelling and Rash    Family History: Family History  Problem Relation Age of Onset  . Aneurysm Mother        brain  . Celiac disease Brother   . Diabetes Maternal Grandfather   . Hypertension Neg Hx   . Breast cancer Neg Hx   . Coronary artery disease Neg Hx   . Colon cancer Neg Hx   . Esophageal cancer Neg Hx   .  Stomach cancer Neg Hx   . Rectal cancer Neg Hx     Social History: Social History   Socioeconomic History  . Marital status: Married    Spouse name: Not on file  . Number of children: 1  . Years of education: Not on file  . Highest education level: Not on file  Occupational History  . Occupation: retired VP of Tax inspector: RETIRED  Social Needs  . Financial resource strain: Not on file  . Food insecurity:    Worry: Not on file    Inability: Not on file  . Transportation needs:    Medical: Not on file    Non-medical: Not on file  Tobacco Use  . Smoking status: Never  Smoker  . Smokeless tobacco: Never Used  Substance and Sexual Activity  . Alcohol use: Yes    Comment: socially  . Drug use: No  . Sexual activity: Not on file  Lifestyle  . Physical activity:    Days per week: Not on file    Minutes per session: Not on file  . Stress: Not on file  Relationships  . Social connections:    Talks on phone: Not on file    Gets together: Not on file    Attends religious service: Not on file    Active member of club or organization: Not on file    Attends meetings of clubs or organizations: Not on file    Relationship status: Not on file  . Intimate partner violence:    Fear of current or ex partner: Not on file    Emotionally abused: Not on file    Physically abused: Not on file    Forced sexual activity: Not on file  Other Topics Concern  . Not on file  Social History Narrative   2 step children, 1 child , [place]- patient and her husband, he recently stop driving      Observations/Objective:   Height 5\' 5"  (1.651 m), weight 160 lb (72.6 kg). No acute distress.  Alert and oriented.  Speech fluent and not dysarthric.  Language intact.  Face symmetric.    Assessment and Plan:   1.  Hypnic headaches  1.  Advised to take 1 cup of black coffee with melatonin 10mg  every night 2.  Due to age, advised to discontinue sumatriptan.  If she continues to have headaches, she may try acetaminophen-caffeine tablets or I can prescribe her tramadol if needed. 3.  Follow up in 6 months.  Follow Up Instructions:    -I discussed the assessment and treatment plan with the patient. The patient was provided an opportunity to ask questions and all were answered. The patient agreed with the plan and demonstrated an understanding of the instructions.   The patient was advised to call back or seek an in-person evaluation if the symptoms worsen or if the condition fails to improve as anticipated.    Total Time spent in visit with the patient was:  15  minutes  Dudley Major, DO

## 2018-10-16 ENCOUNTER — Encounter: Payer: Self-pay | Admitting: Neurology

## 2018-10-16 ENCOUNTER — Other Ambulatory Visit: Payer: Self-pay

## 2018-10-16 ENCOUNTER — Telehealth (INDEPENDENT_AMBULATORY_CARE_PROVIDER_SITE_OTHER): Payer: Medicare Other | Admitting: Neurology

## 2018-10-16 ENCOUNTER — Encounter

## 2018-10-16 VITALS — Ht 65.0 in | Wt 160.0 lb

## 2018-10-16 DIAGNOSIS — G4481 Hypnic headache: Secondary | ICD-10-CM | POA: Diagnosis not present

## 2018-12-04 ENCOUNTER — Encounter: Payer: Self-pay | Admitting: Gastroenterology

## 2018-12-04 ENCOUNTER — Encounter: Payer: Self-pay | Admitting: Internal Medicine

## 2018-12-04 ENCOUNTER — Other Ambulatory Visit: Payer: Self-pay

## 2018-12-04 MED ORDER — VERAPAMIL HCL ER 120 MG PO TBCR: 120.0000 mg | EXTENDED_RELEASE_TABLET | Freq: Every day | ORAL | 3 refills | Status: DC

## 2018-12-05 ENCOUNTER — Other Ambulatory Visit: Payer: Self-pay

## 2018-12-06 ENCOUNTER — Ambulatory Visit (HOSPITAL_BASED_OUTPATIENT_CLINIC_OR_DEPARTMENT_OTHER)
Admission: RE | Admit: 2018-12-06 | Discharge: 2018-12-06 | Disposition: A | Discharge status: Home / Self Care | Payer: Medicare Other | Source: Ambulatory Visit | Attending: Internal Medicine | Admitting: Internal Medicine

## 2018-12-06 ENCOUNTER — Encounter: Payer: Self-pay | Admitting: Internal Medicine

## 2018-12-06 ENCOUNTER — Ambulatory Visit (INDEPENDENT_AMBULATORY_CARE_PROVIDER_SITE_OTHER): Payer: Medicare Other | Admitting: Internal Medicine

## 2018-12-06 VITALS — BP 121/76 | HR 70 | Temp 98.4°F | Resp 16 | Ht 65.0 in | Wt 165.0 lb

## 2018-12-06 DIAGNOSIS — M25551 Pain in right hip: Secondary | ICD-10-CM | POA: Diagnosis not present

## 2018-12-06 MED ORDER — PREDNISONE 10 MG PO TABS: ORAL_TABLET | ORAL | 0 refills | Status: DC

## 2018-12-06 NOTE — Patient Instructions (Signed)
Proceed with x-ray  Rest but go gradually back to your normal activities  Take prednisone for few days  Ice twice a day to the trochanteric bursa  Continue with Tylenol  Call if no better

## 2018-12-06 NOTE — Progress Notes (Signed)
Subjective:    Patient ID: Cynthia [name], female    DOB: 07-26-1945, 73 y.o.   MRN: [id number]  DOS:  12/06/2018 Type of visit - description: acute  2-week history of pain, located at the right buttock.  is gradually getting more intense and frequent. Tylenol usually helps. It is worse when she wakes up and start putting pressure on the hip however after she warms up the pain is somewhat better.   Review of Systems  Denies fever chills Had a fall a month ago, there was no major consequences and she does not recall if she hit her head, hip or back No rash No paresthesias  Past Medical History:  Diagnosis Date  . Allergy   . Anxiety   . Arthritis   . Blood transfusion without reported diagnosis   . Constipation, chronic   . DEGENERATIVE JOINT DISEASE 06/02/2010  . Depression   . Glaucoma 2014   Right eye  . Headache(784.0)   . Hyperlipidemia    hx of  . Osteopenia   . Psoriasis   . PUD (peptic ulcer disease) 2012   Upper GI bleed d/t NSAIDs  . RLS (restless legs syndrome)    robaxin prn    Past Surgical History:  Procedure Laterality Date  . ANKLE FRACTURE SURGERY Left    surgery 12-11 (LEFT)  . [place]    Social History   Socioeconomic History  . Marital status: Married    Spouse name: Not on file  . Number of children: 1  . Years of education: Not on file  . Highest education level: Not on file  Occupational History  . Occupation: retired VP of Tax inspector: RETIRED  Social Needs  . Financial resource strain: Not on file  . Food insecurity    Worry: Not on file    Inability: Not on file  . Transportation needs    Medical: Not on file    Non-medical: Not on file  Tobacco Use  . Smoking status: Never Smoker  . Smokeless tobacco: Never Used  Substance and Sexual Activity  . Alcohol use: Yes    Comment: socially  . Drug use: No  . Sexual activity: Not on file  Lifestyle  . Physical activity    Days per week: Not on  file    Minutes per session: Not on file  . Stress: Not on file  Relationships  . Social Herbalist on phone: Not on file    Gets together: Not on file    Attends religious service: Not on file    Active member of club or organization: Not on file    Attends meetings of clubs or organizations: Not on file    Relationship status: Not on file  . Intimate partner violence    Fear of current or ex partner: Not on file    Emotionally abused: Not on file    Physically abused: Not on file    Forced sexual activity: Not on file  Other Topics Concern  . Not on file  Social History Narrative   2 step children, 1 child , [place]- patient and her husband, he recently stop driving        Lives in two story home      Pt is right handed      Allergies as of 12/06/2018      Reactions   Motrin [ibuprofen]  GI bleed   Pollen Extract    Statins    "delibitating pain all over body"   Sulfonamide Derivatives    Pt unsure of reaction   Ciprofloxacin Swelling, Rash      Medication List       Accurate as of December 06, 2018 11:59 PM. If you have any questions, ask your nurse or doctor.        acetaminophen 325 MG tablet Commonly known as: TYLENOL Take 650 mg by mouth as needed.   azelastine 0.1 % nasal spray Commonly known as: ASTELIN Place 2 sprays into both nostrils at bedtime.   B COMPLEX VITAMINS PO Take by mouth daily.   Bepreve 1.5 % Soln Generic drug: Bepotastine Besilate Place 1 drop into both eyes 2 (two) times daily as needed.   buPROPion 300 MG 24 hr tablet Commonly known as: WELLBUTRIN XL Take 1 tablet (300 mg total) by mouth daily.   calcipotriene 0.005 % cream Commonly known as: DOVONOX Apply topically daily.   cetirizine 10 MG tablet Commonly known as: ZYRTEC Take 10 mg by mouth daily.   clobetasol 0.05 % external solution Commonly known as: TEMOVATE Apply 1 application topically 2 (two) times daily as needed.   Fish Oil 1000 MG  Cpdr Take by mouth.   guaiFENesin 600 MG 12 hr tablet Commonly known as: MUCINEX Take by mouth as needed.   methocarbamol 500 MG tablet Commonly known as: ROBAXIN Take 1 tablet (500 mg total) by mouth 3 (three) times daily as needed.   ondansetron 4 MG tablet Commonly known as: Zofran Take 1 tablet (4 mg total) by mouth every 8 (eight) hours as needed for nausea or vomiting.   polyethylene glycol 17 g packet Commonly known as: MIRALAX / GLYCOLAX Take 17 g by mouth daily as needed.   predniSONE 10 MG tablet Commonly known as: DELTASONE 4 tablets x 2 days, 3 tabs x 2 days, 2 tabs x 2 days, 1 tab x 2 days Started by: Kathlene November, MD   sertraline 100 MG tablet Commonly known as: ZOLOFT Take 1 tablet (100 mg total) by mouth daily.   SUMAtriptan 100 MG tablet Commonly known as: IMITREX TAKE 1 TABLET BY MOUTH AT EARLIEST ONSET OF HEADACHE. MAY REPEAT ONCE IN 2 HOURS IF HEADACHE PERSISTS OR RECURS.   Travoprost (BAK Free) 0.004 % Soln ophthalmic solution Commonly known as: TRAVATAN Place 1 drop into the right eye at bedtime.   triamcinolone cream 0.1 % Commonly known as: KENALOG Apply 1 application topically as needed.   verapamil 120 MG CR tablet Commonly known as: CALAN-SR Take 1 tablet (120 mg total) by mouth at bedtime.   Vitamin D (Cholecalciferol) 25 MCG (1000 UT) Tabs Take by mouth daily.   zolpidem 5 MG tablet Commonly known as: AMBIEN TAKE ONE TABLET BY MOUTH EVERY NIGHT AT BEDTIME AS NEEDED FOR SLEEP           Objective:   Physical Exam BP 121/76 (BP Location: Left Arm, Patient Position: Sitting, Cuff Size: Normal)   Pulse 70   Temp 98.4 F (36.9 C) (Oral)   Resp 16   Ht 5\' 5"  (1.651 m)   Wt 165 lb (74.8 kg)   SpO2 98%   BMI 27.46 kg/m  General:   Well developed, NAD, BMI noted. HEENT:  Normocephalic . Face symmetric, atraumatic MSK: No TTP at the lumbar or sacral spine. No TTP at the sacroiliac joints Hips: Full rotation bilaterally, rotation  trigger some pain on  the right. Slightly tender at the right trochanteric bursa. Skin: Not pale. Not jaundice Neurologic:  alert & oriented X3.  Speech normal, gait appropriate for age and unassisted.  Motor and DTRs symmetric Psych--  Cognition and judgment appear intact.  Cooperative with normal attention span and concentration.  Behavior appropriate. No anxious or depressed appearing.      Assessment     Assessment Hyperlipidemia: Lipitor causes myalgias, declined it other statins, zetia didn't help Anxiety depression insomnia DJD Headaches Osteopenia: 07-2014 T score of -1.7, Rx   vitamin D PUD due to NSAIDs RLS - robaxin  prn Glaucoma Chronic constipation Dx LSC (lichen )  ~ 4045 , sees derm    PLAN: Right hip pain: No history of injury, DDX DJD, tendinitis, trochanteric bursitis. Recommend a x-ray, rest, gradual return to normal activities. Also prednisone, ice and Tylenol. Call if not better.

## 2018-12-08 NOTE — Assessment & Plan Note (Signed)
Right hip pain: No history of injury, DDX DJD, tendinitis, trochanteric bursitis. Recommend a x-ray, rest, gradual return to normal activities. Also prednisone, ice and Tylenol. Call if not better.

## 2018-12-14 ENCOUNTER — Other Ambulatory Visit: Payer: Self-pay | Admitting: Internal Medicine

## 2018-12-16 ENCOUNTER — Encounter: Payer: Self-pay | Admitting: Internal Medicine

## 2018-12-16 DIAGNOSIS — M25551 Pain in right hip: Secondary | ICD-10-CM

## 2018-12-23 ENCOUNTER — Telehealth: Payer: Self-pay | Admitting: Neurology

## 2018-12-23 ENCOUNTER — Other Ambulatory Visit: Payer: Self-pay | Admitting: Neurology

## 2018-12-23 MED ORDER — TRAMADOL HCL 50 MG PO TABS: 50.0000 mg | ORAL_TABLET | Freq: Four times a day (QID) | ORAL | 0 refills | Status: DC | PRN

## 2018-12-23 NOTE — Telephone Encounter (Signed)
Called and advised Pt 

## 2018-12-23 NOTE — Telephone Encounter (Signed)
Called and discussed medications with Pt. She has tried tylenol with caffeine. She is using coffee with melatonin QHS. She had sumatriptin tablets left, and took 1/4 tablets twice with relief, but headache returned several hours later.

## 2018-12-23 NOTE — Telephone Encounter (Signed)
Patient Is calling in about having an extremely painful migraine for the past 3 days. She is wanting to some relief. Thanks!

## 2018-12-23 NOTE — Telephone Encounter (Signed)
Sent prescription of tramadol to Kristopher Oppenheim at [facility]

## 2018-12-23 NOTE — Progress Notes (Signed)
For intractable headache, tramadol sent to Kristopher Oppenheim and [facility]

## 2018-12-29 ENCOUNTER — Encounter: Payer: Self-pay | Admitting: Internal Medicine

## 2018-12-30 ENCOUNTER — Ambulatory Visit (INDEPENDENT_AMBULATORY_CARE_PROVIDER_SITE_OTHER): Payer: Medicare Other | Admitting: Internal Medicine

## 2018-12-30 ENCOUNTER — Other Ambulatory Visit: Payer: Self-pay

## 2018-12-30 DIAGNOSIS — R197 Diarrhea, unspecified: Secondary | ICD-10-CM

## 2018-12-30 NOTE — Telephone Encounter (Signed)
Pt called and scheduled appt  (virtual)

## 2018-12-30 NOTE — Progress Notes (Signed)
Subjective:    Patient ID: Cynthia [name], female    DOB: Sep 19, 1945, 73 y.o.   MRN: [id number]  DOS:  12/30/2018 Type of visit - description: Virtual Visit via Video Note  I connected with@   by a video enabled telemedicine application and verified that I am speaking with the correct person using two identifiers.   THIS ENCOUNTER IS A VIRTUAL VISIT DUE TO COVID-19 - PATIENT WAS NOT SEEN IN THE OFFICE. PATIENT HAS CONSENTED TO VIRTUAL VISIT / TELEMEDICINE VISIT   Location of patient: home  Location of provider: office  I discussed the limitations of evaluation and management by telemedicine and the availability of in person appointments. The patient expressed understanding and agreed to proceed.  History of Present Illness: Acute Symptoms of started 2 weeks ago with loose stools, approximately 4 BMs daily, typically only one of them is large. Stools are brown. In the last 2 days, the stool has been more loose than before and she has developed mild abdominal cramping otherwise denies abdominal pain. New medications: Verapamil and from time to time takes tramadol for headaches.  No recent antibiotics  Review of Systems She lives with her husband, he is unaffected No fever chills Appetite is okay No vomiting, no blood in the stools. No lack of taste or smell Occasional cough, not a new issue, has allergies.  Past Medical History:  Diagnosis Date  . Allergy   . Anxiety   . Arthritis   . Blood transfusion without reported diagnosis   . Constipation, chronic   . DEGENERATIVE JOINT DISEASE 06/02/2010  . Depression   . Glaucoma 2014   Right eye  . Headache(784.0)   . Hyperlipidemia    hx of  . Osteopenia   . Psoriasis   . PUD (peptic ulcer disease) 2012   Upper GI bleed d/t NSAIDs  . RLS (restless legs syndrome)    robaxin prn    Past Surgical History:  Procedure Laterality Date  . ANKLE FRACTURE SURGERY Left    surgery 12-11 (LEFT)  . Oxly     Social History   Socioeconomic History  . Marital status: Married    Spouse name: Not on file  . Number of children: 1  . Years of education: Not on file  . Highest education level: Not on file  Occupational History  . Occupation: retired VP of Tax inspector: RETIRED  Social Needs  . Financial resource strain: Not on file  . Food insecurity    Worry: Not on file    Inability: Not on file  . Transportation needs    Medical: Not on file    Non-medical: Not on file  Tobacco Use  . Smoking status: Never Smoker  . Smokeless tobacco: Never Used  Substance and Sexual Activity  . Alcohol use: Yes    Comment: socially  . Drug use: No  . Sexual activity: Not on file  Lifestyle  . Physical activity    Days per week: Not on file    Minutes per session: Not on file  . Stress: Not on file  Relationships  . Social Herbalist on phone: Not on file    Gets together: Not on file    Attends religious service: Not on file    Active member of club or organization: Not on file    Attends meetings of clubs or organizations: Not on file    Relationship status: Not on  file  . Intimate partner violence    Fear of current or ex partner: Not on file    Emotionally abused: Not on file    Physically abused: Not on file    Forced sexual activity: Not on file  Other Topics Concern  . Not on file  Social History Narrative   2 step children, 1 child , [place]- patient and her husband, he recently stop driving        Lives in two story home      Pt is right handed      Allergies as of 12/30/2018      Reactions   Motrin [ibuprofen]    GI bleed   Pollen Extract    Statins    "delibitating pain all over body"   Sulfonamide Derivatives    Pt unsure of reaction   Ciprofloxacin Swelling, Rash      Medication List       Accurate as of December 30, 2018 11:59 PM. If you have any questions, ask your nurse or doctor.        acetaminophen 325 MG tablet  Commonly known as: TYLENOL Take 650 mg by mouth as needed.   azelastine 0.1 % nasal spray Commonly known as: ASTELIN Place 2 sprays into both nostrils at bedtime.   B COMPLEX VITAMINS PO Take by mouth daily.   Bepreve 1.5 % Soln Generic drug: Bepotastine Besilate Place 1 drop into both eyes 2 (two) times daily as needed.   buPROPion 300 MG 24 hr tablet Commonly known as: WELLBUTRIN XL Take 1 tablet (300 mg total) by mouth daily.   calcipotriene 0.005 % cream Commonly known as: DOVONOX Apply topically daily.   cetirizine 10 MG tablet Commonly known as: ZYRTEC Take 10 mg by mouth daily.   clobetasol 0.05 % external solution Commonly known as: TEMOVATE Apply 1 application topically 2 (two) times daily as needed.   Fish Oil 1000 MG Cpdr Take by mouth.   guaiFENesin 600 MG 12 hr tablet Commonly known as: MUCINEX Take by mouth as needed.   methocarbamol 500 MG tablet Commonly known as: ROBAXIN Take 1 tablet (500 mg total) by mouth 3 (three) times daily as needed.   ondansetron 4 MG tablet Commonly known as: Zofran Take 1 tablet (4 mg total) by mouth every 8 (eight) hours as needed for nausea or vomiting.   polyethylene glycol 17 g packet Commonly known as: MIRALAX / GLYCOLAX Take 17 g by mouth daily as needed.   predniSONE 10 MG tablet Commonly known as: DELTASONE 4 tablets x 2 days, 3 tabs x 2 days, 2 tabs x 2 days, 1 tab x 2 days   sertraline 100 MG tablet Commonly known as: ZOLOFT Take 1 tablet (100 mg total) by mouth daily.   SUMAtriptan 100 MG tablet Commonly known as: IMITREX TAKE 1 TABLET BY MOUTH AT EARLIEST ONSET OF HEADACHE. MAY REPEAT ONCE IN 2 HOURS IF HEADACHE PERSISTS OR RECURS.   traMADol 50 MG tablet Commonly known as: ULTRAM Take 1 tablet (50 mg total) by mouth every 6 (six) hours as needed.   Travoprost (BAK Free) 0.004 % Soln ophthalmic solution Commonly known as: TRAVATAN Place 1 drop into the right eye at bedtime.   triamcinolone  cream 0.1 % Commonly known as: KENALOG Apply 1 application topically as needed.   verapamil 120 MG CR tablet Commonly known as: CALAN-SR Take 1 tablet (120 mg total) by mouth at bedtime.   Vitamin D (  Cholecalciferol) 25 MCG (1000 UT) Tabs Take by mouth daily.   zolpidem 5 MG tablet Commonly known as: AMBIEN TAKE ONE TABLET BY MOUTH EVERY NIGHT AT BEDTIME AS NEEDED FOR SLEEP           Objective:   Physical Exam There were no vitals taken for this visit. This is a virtual video visit, alert oriented x3, no apparent distress    Assessment      Assessment Hyperlipidemia: Lipitor causes myalgias, declined it other statins, zetia didn't help Anxiety depression insomnia DJD Headaches Osteopenia: 07-2014 T score of -1.7, Rx   vitamin D PUD due to NSAIDs RLS - robaxin  prn Glaucoma Chronic constipation Dx LSC (lichen )  ~ 1771 , sees derm    PLAN: Acute diarrhea: Symptoms started 2 weeks ago, no red flags, she looks well today. Likely self limited illness, viral?. Doubt COVID.  The patient agrees. Plan: pt will provide a sample for stool GI pathogens, recommend to push fluids -which she is doing-, Imodium as needed, call if not gradually better.

## 2018-12-31 ENCOUNTER — Encounter: Payer: Self-pay | Admitting: Internal Medicine

## 2018-12-31 ENCOUNTER — Other Ambulatory Visit: Payer: Medicare Other

## 2018-12-31 ENCOUNTER — Telehealth: Payer: Self-pay | Admitting: Family Medicine

## 2018-12-31 DIAGNOSIS — R197 Diarrhea, unspecified: Secondary | ICD-10-CM

## 2018-12-31 NOTE — Telephone Encounter (Signed)
Requesting: Ambien Contract: N/A UDS: N/A Last OV: 12/30/2018 Next OV: N/A Last Refill: 07/26/2018, #30--3 RF Database:   Please advise

## 2018-12-31 NOTE — Assessment & Plan Note (Signed)
Acute diarrhea: Symptoms started 2 weeks ago, no red flags, she looks well today. Likely self limited illness, viral?. Doubt COVID.  The patient agrees. Plan: pt will provide a sample for stool GI pathogens, recommend to push fluids -which she is doing-, Imodium as needed, call if not gradually better.

## 2018-12-31 NOTE — Telephone Encounter (Signed)
Sent!

## 2019-01-03 LAB — GASTROINTESTINAL PATHOGEN PANEL PCR
C. difficile Tox A/B, PCR: NOT DETECTED
Campylobacter, PCR: NOT DETECTED
Cryptosporidium, PCR: NOT DETECTED
E coli (ETEC) LT/ST PCR: NOT DETECTED
E coli (STEC) stx1/stx2, PCR: NOT DETECTED
E coli 0157, PCR: NOT DETECTED
Giardia lamblia, PCR: NOT DETECTED
Norovirus, PCR: NOT DETECTED
Rotavirus A, PCR: NOT DETECTED
Salmonella, PCR: NOT DETECTED
Shigella, PCR: NOT DETECTED

## 2019-01-08 ENCOUNTER — Encounter: Payer: Self-pay | Admitting: Physical Therapy

## 2019-01-08 ENCOUNTER — Other Ambulatory Visit: Payer: Self-pay

## 2019-01-08 ENCOUNTER — Ambulatory Visit: Payer: Medicare Other | Attending: Orthopedic Surgery | Admitting: Physical Therapy

## 2019-01-08 DIAGNOSIS — M5441 Lumbago with sciatica, right side: Secondary | ICD-10-CM

## 2019-01-08 DIAGNOSIS — M6281 Muscle weakness (generalized): Secondary | ICD-10-CM | POA: Diagnosis present

## 2019-01-08 DIAGNOSIS — R262 Difficulty in walking, not elsewhere classified: Secondary | ICD-10-CM | POA: Diagnosis present

## 2019-01-08 NOTE — Therapy (Signed)
[place] [address] [place], [place], [address] Phone: [phone]   Fax:  [phone]  Physical Therapy Evaluation  Patient Details  Name: Cynthia [name] MRN: [id number] Date of Birth: 1945-12-29 Referring Provider (PT): Nevin Bloodgood Date: 01/08/2019  PT End of Session - 01/08/19 0912    Visit Number  1    Date for PT Re-Evaluation  03/10/19    PT Start Time  0844    PT Stop Time  0935    PT Time Calculation (min)  51 min    Activity Tolerance  Patient tolerated treatment well    Behavior During Therapy  [facility] for tasks assessed/performed       Past Medical History:  Diagnosis Date  . Allergy   . Anxiety   . Arthritis   . Blood transfusion without reported diagnosis   . Constipation, chronic   . DEGENERATIVE JOINT DISEASE 06/02/2010  . Depression   . Glaucoma 2014   Right eye  . Headache(784.0)   . Hyperlipidemia    hx of  . Osteopenia   . Psoriasis   . PUD (peptic ulcer disease) 2012   Upper GI bleed d/t NSAIDs  . RLS (restless legs syndrome)    robaxin prn    Past Surgical History:  Procedure Laterality Date  . ANKLE FRACTURE SURGERY Left    surgery 12-11 (LEFT)  . CESAREAN SECTION  1969    There were no vitals filed for this visit.   Subjective Assessment - 01/08/19 0848    Subjective  Patient reports that she has had back pain in the past, diagnosed with stenosis.  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  She reports that she has to do a lot of care.  She has c/o right buttock and hip pain.  She reports onset of pain this episode about a month ago, had an incident on the stairs where the right leg gave out and felt weak  Reports that she has to do stairs one at a time    Limitations  Lifting;Standing;Walking;House hold activities    Patient Stated Goals  have less pain, be stronger    Currently in Pain?  Yes    Pain Score  4     Pain Location  Back    Pain Orientation   Lower;Right    Pain Descriptors / Indicators  Aching    Pain Type  Acute pain    Pain Radiating Towards  right buttock and the right GT area and at times into the thight    Pain Onset  More than a month ago    Pain Frequency  Constant    Aggravating Factors   bending, stairs, standing  pain up to 8/10    Pain Relieving Factors  first thing in the morning, tylenol pain can be a 3-4/10    Effect of Pain on Daily Activities  limits everything, reports that she is having some difficulty with caring for her husband, and has asked MD for a home health consult to help her with him         [facility] PT Assessment - 01/08/19 0001      Assessment   Medical Diagnosis  LBP, sciatica right side    Referring Provider (PT)  Paz    Onset Date/Surgical Date  12/08/18    Prior Therapy  no      Precautions   Precautions  None  Balance Screen   Has the patient fallen in the past 6 months  No    Has the patient had a decrease in activity level because of a fear of falling?   No    Is the patient reluctant to leave their home because of a fear of falling?   No      Home Environment   Additional Comments  has stairs, has lift chair for husband, cares for husband      Prior Function   Level of Independence  Independent    Vocation  Retired    Leisure  cares for husband      ROM / Strength   AROM / PROM / Strength  AROM;Strength      AROM   Overall AROM Comments  Lumbar ROM WNL's for flexion, decreased 25% for other motions with some pain      Strength   Overall Strength Comments  right hip 4-/5 with pain      Flexibility   Soft Tissue Assessment /Muscle Length  yes    Hamstrings  tight    Quadriceps  tight    ITB  tight    Piriformis  tight      Palpation   Palpation comment  very tender in the right SI area, right GT area, tight mms in the lumbar spine      Ambulation/Gait   Gait Comments  limping on the right, she does stairs one at a time                Objective  measurements completed on examination: See above findings.      [facility] Adult PT Treatment/Exercise - 01/08/19 0001      Modalities   Modalities  Electrical Stimulation;Moist Heat      Moist Heat Therapy   Number Minutes Moist Heat  15 Minutes    Moist Heat Location  Lumbar Spine;Hip      Electrical Stimulation   Electrical Stimulation Location  right buttock into the right lateral hip    Electrical Stimulation Action  IFC    Electrical Stimulation Parameters  supine    Electrical Stimulation Goals  Pain             PT Education - 01/08/19 0912    Education Details  Wms flexion    Person(s) Educated  Patient    Methods  Explanation;Demonstration;Tactile cues;Handout    Comprehension  Verbalized understanding       PT Short Term Goals - 01/08/19 0916      PT SHORT TERM GOAL #1   Title  independent iwth initial HEP    Time  8    Period  Weeks    Status  New        PT Long Term Goals - 01/08/19 [id number]      PT LONG TERM GOAL #1   Title  pt will be ind with HEP and able to manage her pain    Time  8    Period  Weeks    Status  New      PT LONG TERM GOAL #2   Title  increase lumbar ROM 25%    Time  8    Period  Weeks    Status  New      PT LONG TERM GOAL #3   Title  increase right LE strength to 4+/5    Time  8    Period  Weeks    Status  New  PT LONG TERM GOAL #4   Title  understand posture and body mechanics especially with caring for husband    Time  8    Period  Weeks    Status  New             Plan - 01/08/19 0913    Clinical Impression Statement  Patient reports onset of LBP about a month ago, she is a caregiver for her husband and reports a lot of stress on her.  She has been diagnosed with stenosis.  She describes weakness in the right LE and difficulty on stairs.  She is very tight in the right LE.    Stability/Clinical Decision Making  Evolving/Moderate complexity    Clinical Decision Making  Low    Rehab Potential  Good    PT  Frequency  2x / week    PT Duration  8 weeks    PT Treatment/Interventions  ADLs/Self Care Home Management;Cryotherapy;Electrical Stimulation;Iontophoresis 4mg /ml Dexamethasone;Moist Heat;Ultrasound;Traction;Patient/family education;Therapeutic activities;Therapeutic exercise;Neuromuscular re-education;Manual techniques    PT Next Visit Plan  slowly work on strength and flexibility    Consulted and Agree with Plan of Care  Patient       Patient will benefit from skilled therapeutic intervention in order to improve the following deficits and impairments:  Abnormal gait, Improper body mechanics, Pain, Postural dysfunction, Increased muscle spasms, Decreased mobility, Decreased activity tolerance, Decreased range of motion, Decreased strength, Impaired flexibility, Difficulty walking  Visit Diagnosis: Acute right-sided low back pain with right-sided sciatica - Plan: PT plan of care cert/re-cert  Difficulty in walking, not elsewhere classified - Plan: PT plan of care cert/re-cert  Muscle weakness (generalized) - Plan: PT plan of care cert/re-cert     Problem List Patient Active Problem List   Diagnosis Date Noted  . Viral syndrome 03/29/2018  . Gastroenteritis 01/21/2018  . Essential tremor 07/25/2017  . Lichen [id number]  . PCP NOTES >>>>>>>>>>>>>>>>>>>>>>>>>>>>>>>>>>> 03/25/2015  . H/O adenomatous polyp of colon 05/18/2014  . Annual physical exam 03/13/2011  . Osteopenia   . Osteoarthritis 06/02/2010  . Hyperlipidemia 11/09/2009  . Migraine headache 06/28/2009  . CONSTIPATION, CHRONIC 02/01/2007  . Anxiety and depression 08/17/2006  . ALLERGIC RHINITIS 08/17/2006    Sumner Boast., PT 01/08/2019, 9:20 AM  [facility] [address] [place], [place], [address] Phone: [phone]   Fax:  [phone]  Name: Cynthia [name] MRN: [id number] Date of Birth: [birth year]

## 2019-01-15 ENCOUNTER — Ambulatory Visit: Payer: Medicare Other | Attending: Orthopedic Surgery | Admitting: Physical Therapy

## 2019-01-15 ENCOUNTER — Other Ambulatory Visit: Payer: Self-pay

## 2019-01-15 ENCOUNTER — Encounter: Payer: Self-pay | Admitting: Physical Therapy

## 2019-01-15 DIAGNOSIS — M62838 Other muscle spasm: Secondary | ICD-10-CM | POA: Insufficient documentation

## 2019-01-15 DIAGNOSIS — M5441 Lumbago with sciatica, right side: Secondary | ICD-10-CM | POA: Diagnosis not present

## 2019-01-15 DIAGNOSIS — R262 Difficulty in walking, not elsewhere classified: Secondary | ICD-10-CM | POA: Insufficient documentation

## 2019-01-15 DIAGNOSIS — M6281 Muscle weakness (generalized): Secondary | ICD-10-CM | POA: Diagnosis present

## 2019-01-15 NOTE — Therapy (Signed)
[place], [place], [address] Phone: [phone]   Fax:  [phone]  Physical Therapy Treatment  Patient Details  Name: Cynthia [name] MRN: [id number] Date of Birth: Apr 07, 1946 Referring Provider (PT): Nevin Bloodgood Date: 01/15/2019  PT End of Session - 01/15/19 0919    Visit Number  2    Date for PT Re-Evaluation  03/10/19    PT Start Time  0841    PT Stop Time  0936    PT Time Calculation (min)  55 min    Activity Tolerance  Patient tolerated treatment well    Behavior During Therapy  [facility] for tasks assessed/performed       Past Medical History:  Diagnosis Date  . Allergy   . Anxiety   . Arthritis   . Blood transfusion without reported diagnosis   . Constipation, chronic   . DEGENERATIVE JOINT DISEASE 06/02/2010  . Depression   . Glaucoma 2014   Right eye  . Headache(784.0)   . Hyperlipidemia    hx of  . Osteopenia   . Psoriasis   . PUD (peptic ulcer disease) 2012   Upper GI bleed d/t NSAIDs  . RLS (restless legs syndrome)    robaxin prn    Past Surgical History:  Procedure Laterality Date  . ANKLE FRACTURE SURGERY Left    surgery 12-11 (LEFT)  . CESAREAN SECTION  1969    There were no vitals filed for this visit.  Subjective Assessment - 01/15/19 0845    Subjective  Reports that she is doing okay, yesterday she went shopping but then was really hurting in the low back last night    Currently in Pain?  Yes    Pain Score  4     Pain Location  Back    Aggravating Factors   standing shopping                       [facility] Adult PT Treatment/Exercise - 01/15/19 0001      Exercises   Exercises  Lumbar      Lumbar Exercises: Stretches   Passive Hamstring Stretch  Right;Left;3 reps;20 seconds    Piriformis Stretch  Right;Left;3 reps;20 seconds      Lumbar Exercises: Aerobic   Recumbent Bike  5 minutes     Nustep  level 4 x 5 minutes      Lumbar  Exercises: Machines for Strengthening   Cybex Knee Extension  5# 2x10    Cybex Knee Flexion  20# 2x10    Other Lumbar Machine Exercise  15# lats and rows 2x10      Lumbar Exercises: Supine   Other Supine Lumbar Exercises  K2C, obliques, small bridges and isometric abs      Modalities   Modalities  Electrical Stimulation;Moist Heat      Moist Heat Therapy   Number Minutes Moist Heat  15 Minutes    Moist Heat Location  Lumbar Spine;Hip      Electrical Stimulation   Electrical Stimulation Location  right buttock into the right lateral hip    Electrical Stimulation Action  IFC    Electrical Stimulation Parameters  supine    Electrical Stimulation Goals  Pain               PT Short Term Goals - 01/15/19 0921      PT SHORT TERM GOAL #1   Title  independent iwth  initial HEP    Status  Achieved        PT Long Term Goals - 01/08/19 0917      PT LONG TERM GOAL #1   Title  pt will be ind with HEP and able to manage her pain    Time  8    Period  Weeks    Status  New      PT LONG TERM GOAL #2   Title  increase lumbar ROM 25%    Time  8    Period  Weeks    Status  New      PT LONG TERM GOAL #3   Title  increase right LE strength to 4+/5    Time  8    Period  Weeks    Status  New      PT LONG TERM GOAL #4   Title  understand posture and body mechanics especially with caring for husband    Time  8    Period  Weeks    Status  New            Plan - 01/15/19 0920    Clinical Impression Statement  Patient did well with the exercises, she was able to recall HEP.  She was fatigued with exercises but no increse of pain.  She does have a hard time relaxing requiring cues    PT Next Visit Plan  slowly work on strength and flexibility    Consulted and Agree with Plan of Care  Patient       Patient will benefit from skilled therapeutic intervention in order to improve the following deficits and impairments:  Abnormal gait, Improper body mechanics, Pain, Postural  dysfunction, Increased muscle spasms, Decreased mobility, Decreased activity tolerance, Decreased range of motion, Decreased strength, Impaired flexibility, Difficulty walking  Visit Diagnosis: Acute right-sided low back pain with right-sided sciatica  Difficulty in walking, not elsewhere classified  Muscle weakness (generalized)  Other muscle spasm     Problem List Patient Active Problem List   Diagnosis Date Noted  . Viral syndrome 03/29/2018  . Gastroenteritis 01/21/2018  . Essential tremor 07/25/2017  . Lichen [id number]  . PCP NOTES >>>>>>>>>>>>>>>>>>>>>>>>>>>>>>>>>>> 03/25/2015  . H/O adenomatous polyp of colon 05/18/2014  . Annual physical exam 03/13/2011  . Osteopenia   . Osteoarthritis 06/02/2010  . Hyperlipidemia 11/09/2009  . Migraine headache 06/28/2009  . CONSTIPATION, CHRONIC 02/01/2007  . Anxiety and depression 08/17/2006  . ALLERGIC RHINITIS 08/17/2006    Sumner Boast., PT 01/15/2019, 9:22 AM  [facility] [address] [place], [place], [address] Phone: [phone]   Fax:  [phone]  Name: Cynthia [name] MRN: [id number] Date of Birth: [birth year]

## 2019-01-17 ENCOUNTER — Ambulatory Visit: Payer: Medicare Other | Admitting: Physical Therapy

## 2019-01-17 ENCOUNTER — Other Ambulatory Visit: Payer: Self-pay

## 2019-01-17 DIAGNOSIS — R262 Difficulty in walking, not elsewhere classified: Secondary | ICD-10-CM

## 2019-01-17 DIAGNOSIS — M6281 Muscle weakness (generalized): Secondary | ICD-10-CM

## 2019-01-17 DIAGNOSIS — M5441 Lumbago with sciatica, right side: Secondary | ICD-10-CM | POA: Diagnosis not present

## 2019-01-17 NOTE — Therapy (Signed)
[place], [place], [address] Phone: [phone]   Fax:  [phone]  Physical Therapy Treatment  Patient Details  Name: Cynthia [name] MRN: [id number] Date of Birth: June 27, 1945 Referring Provider (PT): Nevin Bloodgood Date: 01/17/2019  PT End of Session - 01/17/19 0915    Visit Number  3    Date for PT Re-Evaluation  03/10/19    PT Start Time  4650    PT Stop Time  0935    PT Time Calculation (min)  44 min       Past Medical History:  Diagnosis Date  . Allergy   . Anxiety   . Arthritis   . Blood transfusion without reported diagnosis   . Constipation, chronic   . DEGENERATIVE JOINT DISEASE 06/02/2010  . Depression   . Glaucoma 2014   Right eye  . Headache(784.0)   . Hyperlipidemia    hx of  . Osteopenia   . Psoriasis   . PUD (peptic ulcer disease) 2012   Upper GI bleed d/t NSAIDs  . RLS (restless legs syndrome)    robaxin prn    Past Surgical History:  Procedure Laterality Date  . ANKLE FRACTURE SURGERY Left    surgery 12-11 (LEFT)  . CESAREAN SECTION  1969    There were no vitals filed for this visit.  Subjective Assessment - 01/17/19 0851    Subjective  5 min late- verb migrane last night and kinda out of sorts.pt states RT leg still feels unstable and like it may give way. pain better    Currently in Pain?  Yes    Pain Score  3     Pain Location  Hip                       [facility] Adult PT Treatment/Exercise - 01/17/19 0001      Lumbar Exercises: Aerobic   Nustep  L 5 68mn      Lumbar Exercises: Machines for Strengthening   Cybex Lumbar Extension  black tband 2 sets 10    Cybex Knee Extension  5# 2x10    Cybex Knee Flexion  20# 2x10    Other Lumbar Machine Exercise  15# lats and rows 2x10      Lumbar Exercises: Standing   Other Standing Lumbar Exercises  red tband hip flex,ext and abd 10 each with UE support      Lumbar Exercises: Seated   Sit  to Stand  5 reps   from mat without UE. painful   Other Seated Lumbar Exercises  add ball squeeze 2 sets 10    Other Seated Lumbar Exercises  red tband hip flex and and 10 each   painful on the RT and min A with Left d/t decreased SLS on R     Modalities   Modalities  Electrical Stimulation;Moist Heat      Moist Heat Therapy   Number Minutes Moist Heat  15 Minutes    Moist Heat Location  Lumbar Spine;Hip      Electrical Stimulation   Electrical Stimulation Location  right buttock into the right lateral hip    Electrical Stimulation Action  IFC    Electrical Stimulation Parameters  supine    Electrical Stimulation Goals  Pain               PT Short Term Goals - 01/17/19 0913      PT  SHORT TERM GOAL #1   Title  independent iwth initial HEP    Baseline  stretches    Status  Achieved        PT Long Term Goals - 01/08/19 0917      PT LONG TERM GOAL #1   Title  pt will be ind with HEP and able to manage her pain    Time  8    Period  Weeks    Status  New      PT LONG TERM GOAL #2   Title  increase lumbar ROM 25%    Time  8    Period  Weeks    Status  New      PT LONG TERM GOAL #3   Title  increase right LE strength to 4+/5    Time  8    Period  Weeks    Status  New      PT LONG TERM GOAL #4   Title  understand posture and body mechanics especially with caring for husband    Time  8    Period  Weeks    Status  New            Plan - 01/17/19 6893    Clinical Impression Statement  STG met. Added hip ex and pt is very week BIL but esp on RT with increased pain. decreased ability to SLS on RT confidently -fearly of leg giving away. VCing and guarding needed with ex    PT Treatment/Interventions  ADLs/Self Care Home Management;Cryotherapy;Electrical Stimulation;Iontophoresis 25m/ml Dexamethasone;Moist Heat;Ultrasound;Traction;Patient/family education;Therapeutic activities;Therapeutic exercise;Neuromuscular re-education;Manual techniques    PT Next Visit  Plan  continue to  work on strength and flexibility       Patient will benefit from skilled therapeutic intervention in order to improve the following deficits and impairments:  Abnormal gait, Improper body mechanics, Pain, Postural dysfunction, Increased muscle spasms, Decreased mobility, Decreased activity tolerance, Decreased range of motion, Decreased strength, Impaired flexibility, Difficulty walking  Visit Diagnosis: Difficulty in walking, not elsewhere classified  Acute right-sided low back pain with right-sided sciatica  Muscle weakness (generalized)     Problem List Patient Active Problem List   Diagnosis Date Noted  . Viral syndrome 03/29/2018  . Gastroenteritis 01/21/2018  . Essential tremor 07/25/2017  . Lichen 040/68/4033 . PCP NOTES >>>>>>>>>>>>>>>>>>>>>>>>>>>>>>>>>>> 03/25/2015  . H/O adenomatous polyp of colon 05/18/2014  . Annual physical exam 03/13/2011  . Osteopenia   . Osteoarthritis 06/02/2010  . Hyperlipidemia 11/09/2009  . Migraine headache 06/28/2009  . CONSTIPATION, CHRONIC 02/01/2007  . Anxiety and depression 08/17/2006  . ALLERGIC RHINITIS 08/17/2006    Ernestine Langworthy,ANGIE PTA 01/17/2019, 9:22 AM  [address] [id number]: [phone]  Fax:  [phone] Name: KATHIRA JANOWICZMRN: [id number] of Birth: [birth year]

## 2019-01-21 ENCOUNTER — Encounter: Payer: Self-pay | Admitting: Physical Therapy

## 2019-01-21 ENCOUNTER — Ambulatory Visit: Payer: Medicare Other | Admitting: Physical Therapy

## 2019-01-21 ENCOUNTER — Other Ambulatory Visit: Payer: Self-pay

## 2019-01-21 DIAGNOSIS — M5441 Lumbago with sciatica, right side: Secondary | ICD-10-CM | POA: Diagnosis not present

## 2019-01-21 DIAGNOSIS — R262 Difficulty in walking, not elsewhere classified: Secondary | ICD-10-CM

## 2019-01-21 DIAGNOSIS — M6281 Muscle weakness (generalized): Secondary | ICD-10-CM

## 2019-01-21 NOTE — Therapy (Signed)
[place], [place], [address] Phone: [phone]   Fax:  [phone]  Physical Therapy Treatment  Patient Details  Name: Cynthia [name] MRN: [id number] Date of Birth: 03/31/1946 Referring Provider (PT): Larose Kells   Encounter Date: 01/21/2019  PT End of Session - 01/21/19 0930    Visit Number  4    Date for PT Re-Evaluation  03/10/19    PT Start Time  0853    PT Stop Time  0944    PT Time Calculation (min)  51 min    Activity Tolerance  Patient tolerated treatment well    Behavior During Therapy  [facility] for tasks assessed/performed       Past Medical History:  Diagnosis Date  . Allergy   . Anxiety   . Arthritis   . Blood transfusion without reported diagnosis   . Constipation, chronic   . DEGENERATIVE JOINT DISEASE 06/02/2010  . Depression   . Glaucoma 2014   Right eye  . Headache(784.0)   . Hyperlipidemia    hx of  . Osteopenia   . Psoriasis   . PUD (peptic ulcer disease) 2012   Upper GI bleed d/t NSAIDs  . RLS (restless legs syndrome)    robaxin prn    Past Surgical History:  Procedure Laterality Date  . ANKLE FRACTURE SURGERY Left    surgery 12-11 (LEFT)  . CESAREAN SECTION  1969    There were no vitals filed for this visit.  Subjective Assessment - 01/21/19 0840    Subjective  8 minutes late, pain in hip. husband needed extra care in the bathroom alot more bending and picking up may have exacerbated pain.    Pain Score  6     Pain Location  Hip    Pain Orientation  Right                       [facility] Adult PT Treatment/Exercise - 01/21/19 0001      Lumbar Exercises: Aerobic   Nustep  L 5 59min      Lumbar Exercises: Machines for Strengthening   Cybex Knee Extension  5# 2x10   attempted 10#, too much   Cybex Knee Flexion  20# 2x10   attempted 25#, too much     Lumbar Exercises: Seated   Sit to Stand  5 reps    Sit to Stand Limitations  Pain experienced  during exercise 8/10    Other Seated Lumbar Exercises  add ball squeeze 2 sets 10   elicits pain, but bearable. lessened a bit during exercise   Other Seated Lumbar Exercises  red T band hip flx, abd 2x10      Lumbar Exercises: Supine   Other Supine Lumbar Exercises  Bridges, ab sets, K2C, obliques 10 reps      Modalities   Modalities  Electrical Stimulation;Moist Heat      Moist Heat Therapy   Number Minutes Moist Heat  15 Minutes    Moist Heat Location  Lumbar Spine;Hip      Electrical Stimulation   Electrical Stimulation Location  right buttock into the right lateral hip    Electrical Stimulation Action  IFC    Electrical Stimulation Parameters  Supine    Electrical Stimulation Goals  Pain      Manual Therapy   Manual Therapy  Passive ROM    Passive ROM  Hip flexion,int/ext rot, and stretch glutes  PT Short Term Goals - 01/17/19 0913      PT SHORT TERM GOAL #1   Title  independent iwth initial HEP    Baseline  stretches    Status  Achieved        PT Long Term Goals - 01/08/19 0917      PT LONG TERM GOAL #1   Title  pt will be ind with HEP and able to manage her pain    Time  8    Period  Weeks    Status  New      PT LONG TERM GOAL #2   Title  increase lumbar ROM 25%    Time  8    Period  Weeks    Status  New      PT LONG TERM GOAL #3   Title  increase right LE strength to 4+/5    Time  8    Period  Weeks    Status  New      PT LONG TERM GOAL #4   Title  understand posture and body mechanics especially with caring for husband    Time  8    Period  Weeks    Status  New            Plan - 01/21/19 0931    Clinical Impression Statement  Pt. tolerated treatment well however pain present and elicited from exercises. some pain relief after performing exrcises. pt slow in ambulation due to guarding to keep balance and pain avoidance. Pt. will benefit from PT to continue to address muscle weakness of the hip, functional endurance,  and balance    PT Treatment/Interventions  ADLs/Self Care Home Management;Cryotherapy;Electrical Stimulation;Iontophoresis 4mg /ml Dexamethasone;Moist Heat;Ultrasound;Traction;Patient/family education;Therapeutic activities;Therapeutic exercise;Neuromuscular re-education;Manual techniques    PT Next Visit Plan  continue to  work on strength and flexibility       Patient will benefit from skilled therapeutic intervention in order to improve the following deficits and impairments:  Abnormal gait, Improper body mechanics, Pain, Postural dysfunction, Increased muscle spasms, Decreased mobility, Decreased activity tolerance, Decreased range of motion, Decreased strength, Impaired flexibility, Difficulty walking  Visit Diagnosis: Difficulty in walking, not elsewhere classified  Acute right-sided low back pain with right-sided sciatica  Muscle weakness (generalized)     Problem List Patient Active Problem List   Diagnosis Date Noted  . Viral syndrome 03/29/2018  . Gastroenteritis 01/21/2018  . Essential tremor 07/25/2017  . Lichen [id number]  . PCP NOTES >>>>>>>>>>>>>>>>>>>>>>>>>>>>>>>>>>> 03/25/2015  . H/O adenomatous polyp of colon 05/18/2014  . Annual physical exam 03/13/2011  . Osteopenia   . Osteoarthritis 06/02/2010  . Hyperlipidemia 11/09/2009  . Migraine headache 06/28/2009  . CONSTIPATION, CHRONIC 02/01/2007  . Anxiety and depression 08/17/2006  . ALLERGIC RHINITIS 08/17/2006    Darcella Cheshire, SPTA 01/21/2019, 9:36 AM  [place] [address] [place], [place], [address] Phone: [phone]   Fax:  [phone]  Name: Cynthia [name] MRN: [id number] Date of Birth: 1946-03-04

## 2019-01-23 ENCOUNTER — Other Ambulatory Visit (HOSPITAL_BASED_OUTPATIENT_CLINIC_OR_DEPARTMENT_OTHER): Payer: Self-pay | Admitting: Internal Medicine

## 2019-01-23 DIAGNOSIS — Z1231 Encounter for screening mammogram for malignant neoplasm of breast: Secondary | ICD-10-CM

## 2019-01-24 ENCOUNTER — Other Ambulatory Visit: Payer: Self-pay

## 2019-01-24 ENCOUNTER — Ambulatory Visit: Payer: Medicare Other | Admitting: Physical Therapy

## 2019-01-24 DIAGNOSIS — M5441 Lumbago with sciatica, right side: Secondary | ICD-10-CM | POA: Diagnosis not present

## 2019-01-24 DIAGNOSIS — M6281 Muscle weakness (generalized): Secondary | ICD-10-CM

## 2019-01-24 DIAGNOSIS — R262 Difficulty in walking, not elsewhere classified: Secondary | ICD-10-CM

## 2019-01-24 NOTE — Therapy (Signed)
[place], [place], [address] Phone: [phone]   Fax:  [phone]  Physical Therapy Treatment  Patient Details  Name: Cynthia [name] MRN: [id number] Date of Birth: [birth year] Referring Provider (PT): Larose Kells   Encounter Date: 01/24/2019  PT End of Session - 01/24/19 0824    Visit Number  5    Date for PT Re-Evaluation  03/10/19    PT Start Time  [id number]    PT Stop Time  0842    PT Time Calculation (min)  47 min       Past Medical History:  Diagnosis Date  . Allergy   . Anxiety   . Arthritis   . Blood transfusion without reported diagnosis   . Constipation, chronic   . DEGENERATIVE JOINT DISEASE 06/02/2010  . Depression   . Glaucoma 2014   Right eye  . Headache(784.0)   . Hyperlipidemia    hx of  . Osteopenia   . Psoriasis   . PUD (peptic ulcer disease) 2012   Upper GI bleed d/t NSAIDs  . RLS (restless legs syndrome)    robaxin prn    Past Surgical History:  Procedure Laterality Date  . ANKLE FRACTURE SURGERY Left    surgery 12-11 (LEFT)  . CESAREAN SECTION  1969    There were no vitals filed for this visit.  Subjective Assessment - 01/24/19 0757    Subjective  really hurting this morning, aggravated from having to bend over so much to help husband    Currently in Pain?  Yes    Pain Score  8     Pain Location  Hip    Pain Orientation  Right;Lateral;Posterior                       [facility] Adult PT Treatment/Exercise - 01/24/19 0001      Self-Care   Self-Care  Other Self-Care Comments   bending at knees or sitting when helping husband  pants/shoe     Lumbar Exercises: Supine   Ab Set  15 reps;3 seconds   with ball   Bridge with clamshell  15 reps;3 seconds   red tband NObridge   Bridge with March  15 reps;3 seconds   red tband NO bridge   Straight Leg Raise  10 reps   Rt LE   Straight Leg Raises Limitations  with abd 10 on RT    Other Supine Lumbar  Exercises  bridges, KTC and obl with feet on ball 15 each    Other Supine Lumbar Exercises  add ball squeeze 20       Modalities   Modalities  Electrical Stimulation;Moist Heat      Moist Heat Therapy   Number Minutes Moist Heat  15 Minutes    Moist Heat Location  Lumbar Spine;Hip      Electrical Stimulation   Electrical Stimulation Location  right buttock into the right lateral hip/ITB    Electrical Stimulation Action  IFC    Electrical Stimulation Parameters  SL    Electrical Stimulation Goals  Pain      Manual Therapy   Manual Therapy  Passive ROM;Soft tissue mobilization    Manual therapy comments  =SI alignment    Soft tissue mobilization  RT glut and ITB using vibration    Passive ROM  good ROM without pain except ER increased pain  PT Short Term Goals - 01/17/19 0913      PT SHORT TERM GOAL #1   Title  independent iwth initial HEP    Baseline  stretches    Status  Achieved        PT Long Term Goals - 01/24/19 0827      PT LONG TERM GOAL #1   Title  pt will be ind with HEP and able to manage her pain    Status  On-going      PT LONG TERM GOAL #2   Title  increase lumbar ROM 25%    Status  On-going      PT LONG TERM GOAL #3   Title  increase right LE strength to 4+/5      PT LONG TERM GOAL #4   Title  understand posture and body mechanics especially with caring for husband    Status  On-going            Plan - 01/24/19 [id number]    Clinical Impression Statement  pt arrived with increased pain and pronounced limp d/t pain. adjusted sesssion with supine stab ex after MT. Pt had relief with STW and modalities and tolerated ther ex with minimal discomfort. Discussed modificationa nd BM for pt when helping spouse with pants and shoes asthis is increasing painm,suggested bending knees and /or using folding chair to sit to assess lower half of spouse and avoid strain on her LB and hip- pt VU    PT Treatment/Interventions  ADLs/Self Care Home  Management;Cryotherapy;Electrical Stimulation;Iontophoresis 4mg /ml Dexamethasone;Moist Heat;Ultrasound;Traction;Patient/family education;Therapeutic activities;Therapeutic exercise;Neuromuscular re-education;Manual techniques    PT Next Visit Plan  assess and progress       Patient will benefit from skilled therapeutic intervention in order to improve the following deficits and impairments:  Abnormal gait, Improper body mechanics, Pain, Postural dysfunction, Increased muscle spasms, Decreased mobility, Decreased activity tolerance, Decreased range of motion, Decreased strength, Impaired flexibility, Difficulty walking  Visit Diagnosis: Acute right-sided low back pain with right-sided sciatica  Difficulty in walking, not elsewhere classified  Muscle weakness (generalized)     Problem List Patient Active Problem List   Diagnosis Date Noted  . Viral syndrome 03/29/2018  . Gastroenteritis 01/21/2018  . Essential tremor 07/25/2017  . Lichen [id number]  . PCP NOTES >>>>>>>>>>>>>>>>>>>>>>>>>>>>>>>>>>> 03/25/2015  . H/O adenomatous polyp of colon 05/18/2014  . Annual physical exam 03/13/2011  . Osteopenia   . Osteoarthritis 06/02/2010  . Hyperlipidemia 11/09/2009  . Migraine headache 06/28/2009  . CONSTIPATION, CHRONIC 02/01/2007  . Anxiety and depression 08/17/2006  . ALLERGIC RHINITIS 08/17/2006    Chanley Mcenery,ANGIE PTA 01/24/2019, 8:28 AM  [place], [place], [address] Phone: [phone]   Fax:  [phone]  Name: Cynthia [name] MRN: [id number] Date of Birth: 1945/06/25

## 2019-01-27 ENCOUNTER — Ambulatory Visit (HOSPITAL_BASED_OUTPATIENT_CLINIC_OR_DEPARTMENT_OTHER)
Admission: RE | Admit: 2019-01-27 | Discharge: 2019-01-27 | Disposition: A | Discharge status: Home / Self Care | Payer: Medicare Other | Source: Ambulatory Visit | Attending: Internal Medicine | Admitting: Internal Medicine

## 2019-01-27 ENCOUNTER — Other Ambulatory Visit: Payer: Self-pay

## 2019-01-27 ENCOUNTER — Encounter (HOSPITAL_BASED_OUTPATIENT_CLINIC_OR_DEPARTMENT_OTHER): Payer: Self-pay

## 2019-01-27 DIAGNOSIS — Z1231 Encounter for screening mammogram for malignant neoplasm of breast: Secondary | ICD-10-CM | POA: Insufficient documentation

## 2019-01-28 ENCOUNTER — Ambulatory Visit: Payer: Medicare Other | Admitting: Physical Therapy

## 2019-01-28 ENCOUNTER — Encounter: Payer: Self-pay | Admitting: Physical Therapy

## 2019-01-28 DIAGNOSIS — M5441 Lumbago with sciatica, right side: Secondary | ICD-10-CM | POA: Diagnosis not present

## 2019-01-28 DIAGNOSIS — M62838 Other muscle spasm: Secondary | ICD-10-CM

## 2019-01-28 DIAGNOSIS — R262 Difficulty in walking, not elsewhere classified: Secondary | ICD-10-CM

## 2019-01-28 DIAGNOSIS — M6281 Muscle weakness (generalized): Secondary | ICD-10-CM

## 2019-01-28 NOTE — Therapy (Signed)
[place], [place], [address] Phone: [phone]   Fax:  [phone]  Physical Therapy Treatment  Patient Details  Name: Cynthia [name] MRN: [id number] Date of Birth: 13-Apr-1946 Referring Provider (PT): Larose Kells   Encounter Date: 01/28/2019  PT End of Session - 01/28/19 0924    Visit Number  6    Date for PT Re-Evaluation  03/10/19    PT Start Time  0843    PT Stop Time  0941    PT Time Calculation (min)  58 min    Activity Tolerance  Patient tolerated treatment well    Behavior During Therapy  [facility] for tasks assessed/performed       Past Medical History:  Diagnosis Date  . Allergy   . Anxiety   . Arthritis   . Blood transfusion without reported diagnosis   . Constipation, chronic   . DEGENERATIVE JOINT DISEASE 06/02/2010  . Depression   . Glaucoma 2014   Right eye  . Headache(784.0)   . Hyperlipidemia    hx of  . Osteopenia   . Psoriasis   . PUD (peptic ulcer disease) 2012   Upper GI bleed d/t NSAIDs  . RLS (restless legs syndrome)    robaxin prn    Past Surgical History:  Procedure Laterality Date  . ANKLE FRACTURE SURGERY Left    surgery 12-11 (LEFT)  . CESAREAN SECTION  1969    There were no vitals filed for this visit.  Subjective Assessment - 01/28/19 0850    Subjective  Patient reports that she is feeling a little better, still difficulty with husband, trying to do the things you suggest but I can't always do it    Currently in Pain?  Yes    Pain Score  5     Pain Location  Hip    Pain Orientation  Right;Lateral;Posterior    Aggravating Factors   dressing husband                       [facility] Adult PT Treatment/Exercise - 01/28/19 0001      Lumbar Exercises: Aerobic   Recumbent Bike  5 minutes level 1      Lumbar Exercises: Machines for Strengthening   Cybex Knee Extension  5# 2x10    Cybex Knee Flexion  20# 2x10    Other Lumbar Machine Exercise   15# lats and rows 2x10      Lumbar Exercises: Supine   Bridge with Ball Squeeze  15 reps;1 second    Other Supine Lumbar Exercises  bridges, KTC and obl with feet on ball 15 each      Modalities   Modalities  Electrical Stimulation;Moist Heat      Moist Heat Therapy   Number Minutes Moist Heat  15 Minutes    Moist Heat Location  Lumbar Spine;Hip      Electrical Stimulation   Electrical Stimulation Location  right buttock into the right lateral hip/ITB    Electrical Stimulation Action  IFC    Electrical Stimulation Parameters  supine    Electrical Stimulation Goals  Pain      Manual Therapy   Manual Therapy  Passive ROM;Soft tissue mobilization    Soft tissue mobilization  RT glut and ITB using vibration               PT Short Term Goals - 01/17/19 0913      PT SHORT  TERM GOAL #1   Title  independent iwth initial HEP    Baseline  stretches    Status  Achieved        PT Long Term Goals - 01/28/19 0926      PT LONG TERM GOAL #1   Title  pt will be ind with HEP and able to manage her pain    Status  On-going      PT LONG TERM GOAL #2   Title  increase lumbar ROM 25%    Status  On-going      PT LONG TERM GOAL #3   Title  increase right LE strength to 4+/5    Status  On-going      PT LONG TERM GOAL #4   Title  understand posture and body mechanics especially with caring for husband    Status  Partially Met            Plan - 01/28/19 0924    Clinical Impression Statement  Patient doing much better than last week. she does seem overwhelmed at times with the care of her husband especially when trying to find wasier ways on her body.  She is doing a lot of bending with him and she likes the thought of a stool and reports that she is going to try that.  I did mention getting help if it really starts to bother her physically and emotionally, she is less tender but still tender int he right SI, glutes and into the ITB    PT Next Visit Plan  continue to work on  patient strength, function and problem solve body mechanics as she wants to    Consulted and Agree with Plan of Care  Patient       Patient will benefit from skilled therapeutic intervention in order to improve the following deficits and impairments:  Abnormal gait, Improper body mechanics, Pain, Postural dysfunction, Increased muscle spasms, Decreased mobility, Decreased activity tolerance, Decreased range of motion, Decreased strength, Impaired flexibility, Difficulty walking  Visit Diagnosis: Acute right-sided low back pain with right-sided sciatica  Difficulty in walking, not elsewhere classified  Muscle weakness (generalized)  Other muscle spasm     Problem List Patient Active Problem List   Diagnosis Date Noted  . Viral syndrome 03/29/2018  . Gastroenteritis 01/21/2018  . Essential tremor 07/25/2017  . Lichen 22/44/9753  . PCP NOTES >>>>>>>>>>>>>>>>>>>>>>>>>>>>>>>>>>> 03/25/2015  . H/O adenomatous polyp of colon 05/18/2014  . Annual physical exam 03/13/2011  . Osteopenia   . Osteoarthritis 06/02/2010  . Hyperlipidemia 11/09/2009  . Migraine headache 06/28/2009  . CONSTIPATION, CHRONIC 02/01/2007  . Anxiety and depression 08/17/2006  . ALLERGIC RHINITIS 08/17/2006    Sumner Boast., PT 01/28/2019, 9:27 AM  [place] [address] [facility] [place], [place], [address] Phone: [phone]   Fax:  [phone]  Name: Cynthia [name] MRN: [id number] Date of Birth: [birth year]

## 2019-01-30 ENCOUNTER — Ambulatory Visit (INDEPENDENT_AMBULATORY_CARE_PROVIDER_SITE_OTHER): Payer: Medicare Other

## 2019-01-30 ENCOUNTER — Other Ambulatory Visit: Payer: Self-pay

## 2019-01-30 DIAGNOSIS — Z23 Encounter for immunization: Secondary | ICD-10-CM | POA: Diagnosis not present

## 2019-01-30 NOTE — Progress Notes (Signed)
Flu shot given to patient w/o any complications 

## 2019-01-31 ENCOUNTER — Ambulatory Visit: Payer: Medicare Other | Admitting: Physical Therapy

## 2019-02-04 ENCOUNTER — Ambulatory Visit: Payer: Medicare Other | Admitting: Physical Therapy

## 2019-02-06 ENCOUNTER — Other Ambulatory Visit: Payer: Self-pay | Admitting: Neurology

## 2019-02-07 ENCOUNTER — Other Ambulatory Visit: Payer: Self-pay

## 2019-02-07 ENCOUNTER — Ambulatory Visit: Payer: Medicare Other | Admitting: Physical Therapy

## 2019-02-07 DIAGNOSIS — M6281 Muscle weakness (generalized): Secondary | ICD-10-CM

## 2019-02-07 DIAGNOSIS — M5441 Lumbago with sciatica, right side: Secondary | ICD-10-CM | POA: Diagnosis not present

## 2019-02-07 DIAGNOSIS — R262 Difficulty in walking, not elsewhere classified: Secondary | ICD-10-CM

## 2019-02-07 NOTE — Therapy (Signed)
[place], [place], [address] Phone: [phone]   Fax:  [phone]  Physical Therapy Treatment  Patient Details  Name: Cynthia [name] MRN: [id number] Date of Birth: 1945-10-13 Referring Provider (PT): Nevin Bloodgood Date: 02/07/2019  PT End of Session - 02/07/19 0923    Visit Number  7    Date for PT Re-Evaluation  03/10/19    PT Start Time  2122    PT Stop Time  0938    PT Time Calculation (min)  55 min       Past Medical History:  Diagnosis Date  . Allergy   . Anxiety   . Arthritis   . Blood transfusion without reported diagnosis   . Constipation, chronic   . DEGENERATIVE JOINT DISEASE 06/02/2010  . Depression   . Glaucoma 2014   Right eye  . Headache(784.0)   . Hyperlipidemia    hx of  . Osteopenia   . Psoriasis   . PUD (peptic ulcer disease) 2012   Upper GI bleed d/t NSAIDs  . RLS (restless legs syndrome)    robaxin prn    Past Surgical History:  Procedure Laterality Date  . ANKLE FRACTURE SURGERY Left    surgery 12-11 (LEFT)  . CESAREAN SECTION  1969    There were no vitals filed for this visit.  Subjective Assessment - 02/07/19 0848    Subjective  stiff with rain but overall therapy has helped , about 40%. pt stated after last session she had a whole day of relief    Currently in Pain?  Yes    Pain Score  4     Pain Location  Hip    Pain Orientation  Right         [facility] PT Assessment - 02/07/19 0001      AROM   Overall AROM Comments  Lumbar WFLS      Strength   Overall Strength Comments  right hip 4-/5 with pain                   [facility] Adult PT Treatment/Exercise - 02/07/19 0001      Lumbar Exercises: Aerobic   Nustep  L 5 6 min      Lumbar Exercises: Machines for Strengthening   Cybex Knee Extension  5# 2x10    Cybex Knee Flexion  20# 2x10    Other Lumbar Machine Exercise  15# lats and rows 2x10      Lumbar Exercises: Standing    Other Standing Lumbar Exercises  hip flex and abd 10 each with UE support   increased pain in RT hip with left leg abd     Lumbar Exercises: Seated   Other Seated Lumbar Exercises  add ball squeeze 2 sets 10    Other Seated Lumbar Exercises  red T band hip flx, abd 2x10      Lumbar Exercises: Supine   Ab Set  15 reps;3 seconds   done in sitting     Modalities   Modalities  Electrical Stimulation;Moist Heat      Moist Heat Therapy   Number Minutes Moist Heat  15 Minutes    Moist Heat Location  Lumbar Spine;Hip      Electrical Stimulation   Electrical Stimulation Location  right buttock into the right lateral hip/ITB    Electrical Stimulation Action  IFC    Electrical Stimulation Parameters  supine    Electrical  Stimulation Goals  Pain      Manual Therapy   Manual Therapy  Passive ROM;Soft tissue mobilization    Soft tissue mobilization  RT glut and ITB using vibration               PT Short Term Goals - 01/17/19 0913      PT SHORT TERM GOAL #1   Title  independent iwth initial HEP    Baseline  stretches    Status  Achieved        PT Long Term Goals - 02/07/19 0850      PT LONG TERM GOAL #1   Title  pt will be ind with HEP and able to manage her pain    Status  Partially Met      PT LONG TERM GOAL #2   Title  increase lumbar ROM 25%    Baseline  WFLs    Status  Achieved      PT LONG TERM GOAL #3   Title  increase right LE strength to 4+/5      PT LONG TERM GOAL #4   Title  understand posture and body mechanics especially with caring for husband    Status  Partially Met            Plan - 02/07/19 0920    Clinical Impression Statement  Lumbar ROM WFLs goal met. progressing with other goals. pt getting stronger and less tenderness in RT hip . SLS on RT and moving left leg esp into abd increased pain. Pt reports a 40% ocerall reduction in pain. less cuing needed with ex and we continue to educ pt on better ways to provide care for spouse to decrease  her risk of increased strain.    PT Treatment/Interventions  ADLs/Self Care Home Management;Cryotherapy;Electrical Stimulation;Iontophoresis 41m/ml Dexamethasone;Moist Heat;Ultrasound;Traction;Patient/family education;Therapeutic activities;Therapeutic exercise;Neuromuscular re-education;Manual techniques    PT Next Visit Plan  continue to work on patient strength and func esp RT hip       Patient will benefit from skilled therapeutic intervention in order to improve the following deficits and impairments:  Abnormal gait, Improper body mechanics, Pain, Postural dysfunction, Increased muscle spasms, Decreased mobility, Decreased activity tolerance, Decreased range of motion, Decreased strength, Impaired flexibility, Difficulty walking  Visit Diagnosis: Acute right-sided low back pain with right-sided sciatica  Difficulty in walking, not elsewhere classified  Muscle weakness (generalized)     Problem List Patient Active Problem List   Diagnosis Date Noted  . Viral syndrome 03/29/2018  . Gastroenteritis 01/21/2018  . Essential tremor 07/25/2017  . Lichen 024/46/2863 . PCP NOTES >>>>>>>>>>>>>>>>>>>>>>>>>>>>>>>>>>> 03/25/2015  . H/O adenomatous polyp of colon 05/18/2014  . Annual physical exam 03/13/2011  . Osteopenia   . Osteoarthritis 06/02/2010  . Hyperlipidemia 11/09/2009  . Migraine headache 06/28/2009  . CONSTIPATION, CHRONIC 02/01/2007  . Anxiety and depression 08/17/2006  . ALLERGIC RHINITIS 08/17/2006    PAYSEUR,ANGIE PTA 02/07/2019, 9:24 AM  [place] [address]Phone: [phone]  Fax:  [phone] Name: Cynthia BERLINGMRN: [id number] of Birth: 2March 14, 1947

## 2019-02-11 ENCOUNTER — Ambulatory Visit: Payer: Medicare Other | Admitting: Physical Therapy

## 2019-02-11 ENCOUNTER — Other Ambulatory Visit: Payer: Self-pay

## 2019-02-11 ENCOUNTER — Encounter: Payer: Self-pay | Admitting: Physical Therapy

## 2019-02-11 DIAGNOSIS — M6281 Muscle weakness (generalized): Secondary | ICD-10-CM

## 2019-02-11 DIAGNOSIS — R262 Difficulty in walking, not elsewhere classified: Secondary | ICD-10-CM

## 2019-02-11 DIAGNOSIS — M5441 Lumbago with sciatica, right side: Secondary | ICD-10-CM | POA: Diagnosis not present

## 2019-02-11 DIAGNOSIS — M62838 Other muscle spasm: Secondary | ICD-10-CM

## 2019-02-11 NOTE — Therapy (Signed)
[place], [place], [address] Phone: [phone]   Fax:  [phone]  Physical Therapy Treatment  Patient Details  Name: Cynthia [name] MRN: [id number] Date of Birth: Jan 05, 1946 Referring Provider (PT): Larose Kells   Encounter Date: 02/11/2019  PT End of Session - 02/11/19 0932    Visit Number  8    Date for PT Re-Evaluation  03/10/19    PT Start Time  0845    PT Stop Time  0940    PT Time Calculation (min)  55 min    Activity Tolerance  Patient tolerated treatment well    Behavior During Therapy  [facility] for tasks assessed/performed       Past Medical History:  Diagnosis Date  . Allergy   . Anxiety   . Arthritis   . Blood transfusion without reported diagnosis   . Constipation, chronic   . DEGENERATIVE JOINT DISEASE 06/02/2010  . Depression   . Glaucoma 2014   Right eye  . Headache(784.0)   . Hyperlipidemia    hx of  . Osteopenia   . Psoriasis   . PUD (peptic ulcer disease) 2012   Upper GI bleed d/t NSAIDs  . RLS (restless legs syndrome)    robaxin prn    Past Surgical History:  Procedure Laterality Date  . ANKLE FRACTURE SURGERY Left    surgery 12-11 (LEFT)  . CESAREAN SECTION  1969    There were no vitals filed for this visit.  Subjective Assessment - 02/11/19 0848    Subjective  "Im better" "Depends on what I am doing"    Currently in Pain?  Yes    Pain Score  3     Pain Location  Hip    Pain Orientation  Right                       [facility] Adult PT Treatment/Exercise - 02/11/19 0001      Lumbar Exercises: Aerobic   Nustep  L 5 6 min      Lumbar Exercises: Machines for Strengthening   Other Lumbar Machine Exercise  15# lats and rows 2x10      Lumbar Exercises: Standing   Other Standing Lumbar Exercises  hip flex x 5 each   Increase R hip pain when Wt bearing.     Lumbar Exercises: Seated   Other Seated Lumbar Exercises  add ball squeeze 2 sets 10    Other Seated Lumbar Exercises  3lb hip flx, abd green 2x10      Modalities   Modalities  Electrical Stimulation;Moist Heat      Electrical Stimulation   Electrical Stimulation Location  right buttock into the right lateral hip/ITB    Electrical Stimulation Action  IFC    Electrical Stimulation Parameters  supine    Electrical Stimulation Goals  Pain      Manual Therapy   Manual Therapy  Passive ROM;Soft tissue mobilization    Soft tissue mobilization  RT glut and ITB using vibration               PT Short Term Goals - 01/17/19 0913      PT SHORT TERM GOAL #1   Title  independent iwth initial HEP    Baseline  stretches    Status  Achieved        PT Long Term Goals - 02/11/19 0934      PT LONG TERM GOAL #  2   Title  increase lumbar ROM 25%    Status  Achieved      PT LONG TERM GOAL #3   Title  increase right LE strength to 4+/5    Status  On-going            Plan - 02/11/19 0934    Clinical Impression Statement  Pt continues to report some overall improvement. R hip pain reported during standing march when having to stand on RLE only. No pain reported with seated hip exercises. Pt did have some fatigue with seated march using 3lb ankle weight. Positive response to MT.    Stability/Clinical Decision Making  Evolving/Moderate complexity    Rehab Potential  Good    PT Frequency  2x / week    PT Duration  8 weeks    PT Treatment/Interventions  ADLs/Self Care Home Management;Cryotherapy;Electrical Stimulation;Iontophoresis 4mg /ml Dexamethasone;Moist Heat;Ultrasound;Traction;Patient/family education;Therapeutic activities;Therapeutic exercise;Neuromuscular re-education;Manual techniques    PT Next Visit Plan  continue to work on patient strength and func esp RT hip       Patient will benefit from skilled therapeutic intervention in order to improve the following deficits and impairments:  Abnormal gait, Improper body mechanics, Pain, Postural dysfunction,  Increased muscle spasms, Decreased mobility, Decreased activity tolerance, Decreased range of motion, Decreased strength, Impaired flexibility, Difficulty walking  Visit Diagnosis: Acute right-sided low back pain with right-sided sciatica  Muscle weakness (generalized)  Other muscle spasm  Difficulty in walking, not elsewhere classified     Problem List Patient Active Problem List   Diagnosis Date Noted  . Viral syndrome 03/29/2018  . Gastroenteritis 01/21/2018  . Essential tremor 07/25/2017  . Lichen [id number]  . PCP NOTES >>>>>>>>>>>>>>>>>>>>>>>>>>>>>>>>>>> 03/25/2015  . H/O adenomatous polyp of colon 05/18/2014  . Annual physical exam 03/13/2011  . Osteopenia   . Osteoarthritis 06/02/2010  . Hyperlipidemia 11/09/2009  . Migraine headache 06/28/2009  . CONSTIPATION, CHRONIC 02/01/2007  . Anxiety and depression 08/17/2006  . ALLERGIC RHINITIS 08/17/2006    Scot Jun, PTA 02/11/2019, 9:37 AM  [place], [place], [address] Phone: [phone]   Fax:  [phone]  Name: CHANEE SPORN MRN: [id number] Date of Birth: [birth year]

## 2019-02-14 ENCOUNTER — Encounter: Payer: Self-pay | Admitting: Physical Therapy

## 2019-03-25 ENCOUNTER — Other Ambulatory Visit: Payer: Self-pay | Admitting: Internal Medicine

## 2019-04-15 NOTE — Progress Notes (Signed)
Virtual Visit via Video Note The purpose of this virtual visit is to provide medical care while limiting exposure to the novel coronavirus.    Consent was obtained for video visit:  Yes.   Answered questions that patient had about telehealth interaction:  Yes.   I discussed the limitations, risks, security and privacy concerns of performing an evaluation and management service by telemedicine. I also discussed with the patient that there may be a patient responsible charge related to this service. The patient expressed understanding and agreed to proceed.  Pt location: Home Physician Location: office Name of referring provider:  Colon Branch, MD I connected with [name] at patients initiation/request on 04/17/2019 at  9:50 AM EST by video enabled telemedicine application and verified that I am speaking with the correct person using two identifiers. Pt MRN:  [id number] Pt DOB:  10-30-1945 Video Participants:  [name]   History of Present Illness:  Cynthia [name] is a 73 year old right-handed woman with depression, glaucoma and degenerative joint disease who follows up for hypnic headache.  UPDATE: Last visit, I advised to discontinue sumatriptan due to her age.  Recommended drinking 1 cup of black coffee with melatonin 10mg  every night.  Over the next month, she began having near daily headaches.  She was started on verapamil ER 120mg  in July.  She was prescribed tramadol for breakthrough pain.  Headaches persisted but instead of increasing verapamil, she wanted to discontinue it.  She is still on 120mg  daily.  Headaches have been sporadic.  Since last visit, she has 3 severe ones with nausea that lasted several hours.  She has had 10 to 12 mild to moderate headache in November    Current NSAIDS:no Current analgesics:acetaminophen-caffeine Current triptans:none Current ergotamine:no Current anti-emetic:Zofran Current muscle relaxants:Robaxin 500mg TID Current  anti-anxiolytic:no Current sleep aide:Ambien Current Antihypertensive medications:no Current Antidepressant medications:Sertraline100mg , bupropion300mg  Current Anticonvulsant medications:no Current anti-CGRP:no Current Vitamins/Herbal/Supplements:B complex, Mg Current Antihistamines/Decongestants:Mucinex, Astelin Other therapy:1 cup black coffee with melatonin 10mg  at bedtime  Depression and anxiety: Yes Other pain: Joint pain  HISTORY: She started having headaches infrequently in 2015but progressively became more frequent in 2016.Varies, but they are usually left sided, involving the ear, but also may be right sided (involving the sinuses) or in band-like distribution.It is both throbbing and non-throbbing.Initially, it is usually 5/10 but severe episodes are 7-8/10.Sometimes there is nausea.There is phonophobia.There is noassociatedphotophobia,visual disturbanceor unilateral numbness or weakness.Initially, it lasts a couple of hours and occuring 15 days out of the month (3 to 4 days severe).It wakes her up at 4:30 to 5 AM.She reports sensation of congestion but no runny nose.CT of sinuses showed opacified right sphenoid sinus and ethmoid air cell.She was treated a couple of times with Augmentin and prednisone, which was effective for a while but headaches then returned.She cannot think of a trigger. They are worse during the summer.There are no aggravating or relieving factors.MRI of brain from 06/10/15 was normal. Sed Rate from 12/27/15 was 10.  Past NSAIDS:ibuprofen (ulcers) Past analgesics:Tylenol Past abortive triptans:sumatriptan (advised to discontinue due to age) Past abortive ergotamine:no Past muscle relaxants:no Past anti-emetic:no Past antihypertensive medications:no Past antidepressant medications:Nortriptyline 50mg  Past anticonvulsant medications:topiramate 25mg  (concerned about glaucoma) Past  anti-CGRP:no Past vitamins/Herbal/Supplements:no Past antihistamines/decongestants:no Other past therapies:none  Past Medical History: Past Medical History:  Diagnosis Date  . Allergy   . Anxiety   . Arthritis   . Blood transfusion without reported diagnosis   . Constipation, chronic   . DEGENERATIVE JOINT DISEASE  06/02/2010  . Depression   . Glaucoma 2014   Right eye  . Headache(784.0)   . Hyperlipidemia    hx of  . Osteopenia   . Psoriasis   . PUD (peptic ulcer disease) 2012   Upper GI bleed d/t NSAIDs  . RLS (restless legs syndrome)    robaxin prn    Medications: Outpatient Encounter Medications as of 04/17/2019  Medication Sig Note  . acetaminophen (TYLENOL) 325 MG tablet Take 650 mg by mouth as needed.   Marland Kitchen azelastine (ASTELIN) 0.1 % nasal spray Place 2 sprays into both nostrils at bedtime.   . B COMPLEX VITAMINS PO Take by mouth daily.   . Bepotastine Besilate (BEPREVE) 1.5 % SOLN Place 1 drop into both eyes 2 (two) times daily as needed.    Marland Kitchen buPROPion (WELLBUTRIN XL) 300 MG 24 hr tablet Take 1 tablet (300 mg total) by mouth daily.   . calcipotriene (DOVONOX) 0.005 % cream Apply topically daily.    . cetirizine (ZYRTEC) 10 MG tablet Take 10 mg by mouth daily.     . clobetasol (TEMOVATE) 0.05 % external solution Apply 1 application topically 2 (two) times daily as needed.    Marland Kitchen guaiFENesin (MUCINEX) 600 MG 12 hr tablet Take by mouth as needed. 01/24/2017: PRN  . methocarbamol (ROBAXIN) 500 MG tablet Take 1 tablet (500 mg total) by mouth 3 (three) times daily as needed.   . Omega-3 Fatty Acids (FISH OIL) 1000 MG CPDR Take by mouth.     . ondansetron (ZOFRAN) 4 MG tablet Take 1 tablet (4 mg total) by mouth every 8 (eight) hours as needed for nausea or vomiting. (Patient not taking: Reported on 12/30/2018)   . polyethylene glycol (MIRALAX / GLYCOLAX) packet Take 17 g by mouth daily as needed.   . predniSONE (DELTASONE) 10 MG tablet 4 tablets x 2 days, 3 tabs x 2 days,  2 tabs x 2 days, 1 tab x 2 days   . sertraline (ZOLOFT) 100 MG tablet Take 1 tablet (100 mg total) by mouth daily.   . SUMAtriptan (IMITREX) 100 MG tablet TAKE 1 TABLET BY MOUTH AT EARLIEST ONSET OF HEADACHE. MAY REPEAT ONCE IN 2 HOURS IF HEADACHE PERSISTS OR RECURS.   . traMADol (ULTRAM) 50 MG tablet TAKE ONE TABLET BY MOUTH EVERY 6 HOURS AS NEEDED   . Travoprost, BAK Free, (TRAVATAN) 0.004 % SOLN ophthalmic solution Place 1 drop into the right eye at bedtime.   . triamcinolone cream (KENALOG) 0.1 % Apply 1 application topically as needed.    . verapamil (CALAN-SR) 120 MG CR tablet Take 1 tablet (120 mg total) by mouth at bedtime.   . Vitamin D, Cholecalciferol, 25 MCG (1000 UT) TABS Take by mouth daily.   Marland Kitchen zolpidem (AMBIEN) 5 MG tablet TAKE 1 TABLET BY MOUTH EVERY NIGHT AT BEDTIME AS NEEDED FOR SLEEP    No facility-administered encounter medications on file as of 04/17/2019.     Allergies: Allergies  Allergen Reactions  . Motrin [Ibuprofen]     GI bleed  . Pollen Extract   . Statins     "delibitating pain all over body"  . Sulfonamide Derivatives     Pt unsure of reaction  . Ciprofloxacin Swelling and Rash    Family History: Family History  Problem Relation Age of Onset  . Aneurysm Mother        brain  . Celiac disease Brother   . Diabetes Maternal Grandfather   . Hypertension Neg Hx   .  Breast cancer Neg Hx   . Coronary artery disease Neg Hx   . Colon cancer Neg Hx   . Esophageal cancer Neg Hx   . Stomach cancer Neg Hx   . Rectal cancer Neg Hx     Social History: Social History   Socioeconomic History  . Marital status: Married    Spouse name: Not on file  . Number of children: 1  . Years of education: Not on file  . Highest education level: Not on file  Occupational History  . Occupation: retired VP of Tax inspector: RETIRED  Social Needs  . Financial resource strain: Not on file  . Food insecurity    Worry: Not on file    Inability: Not on  file  . Transportation needs    Medical: Not on file    Non-medical: Not on file  Tobacco Use  . Smoking status: Never Smoker  . Smokeless tobacco: Never Used  Substance and Sexual Activity  . Alcohol use: Yes    Comment: socially  . Drug use: No  . Sexual activity: Not on file  Lifestyle  . Physical activity    Days per week: Not on file    Minutes per session: Not on file  . Stress: Not on file  Relationships  . Social Herbalist on phone: Not on file    Gets together: Not on file    Attends religious service: Not on file    Active member of club or organization: Not on file    Attends meetings of clubs or organizations: Not on file    Relationship status: Not on file  . Intimate partner violence    Fear of current or ex partner: Not on file    Emotionally abused: Not on file    Physically abused: Not on file    Forced sexual activity: Not on file  Other Topics Concern  . Not on file  Social History Narrative   2 step children, 1 child , [place]- patient and her husband, he recently stop driving        Lives in two story home      Pt is right handed    Observations/Objective:   Height 5\' 5"  (1.651 m), weight 165 lb (74.8 kg). No acute distress.  Alert and oriented.  Speech fluent and not dysarthric.  Language intact.  Eyes orthophoric on primary gaze.  Face symmetric.  Assessment and Plan:   Initially hypnic headache.  Currently features are more consistent with migraine., aggravated by increased stress as caregiver.  1.  We will increase verapamil CR to 180mg  daily.  In the meantime, she will look into the possible copay cost for one of the CGRP inhibitors (Aimovig, Ajovy, Emgality) 2.  For severe headaches, will take tramadol.  Advised to take sparingly due to potential serotonin syndrome in association with her antidepressants.  However, treatment options are limited as I do not like to prescribe triptans to patients over 83 (increased age  increases risk factor for stroke and MI).  She will look into copay for abortive CGRP inhibitors (Nurtec, Ubrelvy) and Reyvow. 3.  Limit use of pain relievers to no more than 2 days out of week to prevent risk of rebound or medication-overuse headache. 4.  Keep headache diary 5.  Follow up in 4 months.  Follow Up Instructions:    -I discussed the assessment and treatment plan with the patient. The patient  was provided an opportunity to ask questions and all were answered. The patient agreed with the plan and demonstrated an understanding of the instructions.   The patient was advised to call back or seek an in-person evaluation if the symptoms worsen or if the condition fails to improve as anticipated.    Total Time spent in visit with the patient was:  18 minutes   Dudley Major, DO

## 2019-04-16 ENCOUNTER — Other Ambulatory Visit: Payer: Self-pay | Admitting: Neurology

## 2019-04-16 NOTE — Telephone Encounter (Signed)
Provider approved Rx

## 2019-04-16 NOTE — Telephone Encounter (Signed)
Requested Prescriptions   Pending Prescriptions Disp Refills  . traMADol (ULTRAM) 50 MG tablet [Pharmacy Med Name: traMADol HCL 50MG  TABLET] 10 tablet 0    Sig: TAKE ONE TABLET BY MOUTH EVERY 6 HOURS AS NEEDED  . verapamil (CALAN-SR) 120 MG CR tablet [Pharmacy Med Name: VERAPAMIL ER 120 MG TABLET] 30 tablet 2    Sig: TAKE ONE TABLET BY MOUTH EVERY NIGHT AT BEDTIME   Rx last filled:02/10/19 #10 0 refills 12/04/18 #30 3 refills  Pt last seen: 10/16/18   Follow up appt scheduled:tomorrow

## 2019-04-17 ENCOUNTER — Telehealth (INDEPENDENT_AMBULATORY_CARE_PROVIDER_SITE_OTHER): Payer: Medicare Other | Admitting: Neurology

## 2019-04-17 ENCOUNTER — Encounter: Payer: Self-pay | Admitting: Neurology

## 2019-04-17 ENCOUNTER — Other Ambulatory Visit: Payer: Self-pay

## 2019-04-17 VITALS — Ht 65.0 in | Wt 165.0 lb

## 2019-04-17 DIAGNOSIS — G43009 Migraine without aura, not intractable, without status migrainosus: Secondary | ICD-10-CM | POA: Diagnosis not present

## 2019-04-17 DIAGNOSIS — G4481 Hypnic headache: Secondary | ICD-10-CM

## 2019-04-17 MED ORDER — VERAPAMIL HCL ER 180 MG PO TBCR: 180.0000 mg | EXTENDED_RELEASE_TABLET | Freq: Every day | ORAL | 5 refills | Status: DC

## 2019-04-23 ENCOUNTER — Other Ambulatory Visit: Payer: Self-pay | Admitting: Internal Medicine

## 2019-04-29 ENCOUNTER — Encounter: Payer: Self-pay | Admitting: Internal Medicine

## 2019-05-01 ENCOUNTER — Ambulatory Visit (INDEPENDENT_AMBULATORY_CARE_PROVIDER_SITE_OTHER): Payer: Medicare Other | Admitting: Internal Medicine

## 2019-05-01 DIAGNOSIS — F32A Depression, unspecified: Secondary | ICD-10-CM

## 2019-05-01 DIAGNOSIS — Z09 Encounter for follow-up examination after completed treatment for conditions other than malignant neoplasm: Secondary | ICD-10-CM | POA: Diagnosis not present

## 2019-05-01 DIAGNOSIS — F419 Anxiety disorder, unspecified: Secondary | ICD-10-CM

## 2019-05-01 DIAGNOSIS — F329 Major depressive disorder, single episode, unspecified: Secondary | ICD-10-CM

## 2019-05-01 DIAGNOSIS — K219 Gastro-esophageal reflux disease without esophagitis: Secondary | ICD-10-CM | POA: Diagnosis not present

## 2019-05-01 MED ORDER — PANTOPRAZOLE SODIUM 40 MG PO TBEC: 40.0000 mg | DELAYED_RELEASE_TABLET | Freq: Every day | ORAL | 3 refills | Status: DC

## 2019-05-01 NOTE — Progress Notes (Signed)
Subjective:    Patient ID: Cynthia [name], female    DOB: Jan 15, 1946, 73 y.o.   MRN: [id number]  DOS:  05/01/2019 Type of visit - description: Virtual Visit via Video Note  I connected with the above patient  by a video enabled telemedicine application and verified that I am speaking with the correct person using two identifiers.   THIS ENCOUNTER IS A VIRTUAL VISIT DUE TO COVID-19 - PATIENT WAS NOT SEEN IN THE OFFICE. PATIENT HAS CONSENTED TO VIRTUAL VISIT / TELEMEDICINE VISIT   Location of patient: home  Location of provider: office  I discussed the limitations of evaluation and management by telemedicine and the availability of in person appointments. The patient expressed understanding and agreed to proceed.  History of Present Illness: Acute The patient has a long history of sporadic heartburn, symptoms have been more consistent for the last 4 weeks, every time she bends over she has heartburn. It is described as a burning sensation at the mid chest with occasional radiation upwards but not to the sides. Symptoms are not exertional. Admits to taking ibuprofen sporadically for headaches  Her husband is sick, on hospice, she is under a lot of stress    Review of Systems Denies nausea, vomiting No change in the color of the stools No dysphagia or odynophagia  Past Medical History:  Diagnosis Date  . Allergy   . Anxiety   . Arthritis   . Blood transfusion without reported diagnosis   . Constipation, chronic   . DEGENERATIVE JOINT DISEASE 06/02/2010  . Depression   . Glaucoma 2014   Right eye  . Headache(784.0)   . Hyperlipidemia    hx of  . Osteopenia   . Psoriasis   . PUD (peptic ulcer disease) 2012   Upper GI bleed d/t NSAIDs  . RLS (restless legs syndrome)    robaxin prn    Past Surgical History:  Procedure Laterality Date  . ANKLE FRACTURE SURGERY Left    surgery 12-11 (LEFT)  . [place]    Social History   Socioeconomic History  .  Marital status: Married    Spouse name: Not on file  . Number of children: 1  . Years of education: Not on file  . Highest education level: Doctorate  Occupational History  . Occupation: retired Environmental consultant: RETIRED  Tobacco Use  . Smoking status: Never Smoker  . Smokeless tobacco: Never Used  Substance and Sexual Activity  . Alcohol use: Yes    Comment: socially  . Drug use: No  . Sexual activity: Not on file  Other Topics Concern  . Not on file  Social History Narrative   2 step children, 1 child , [place]- patient and her husband, he recently stop driving        Lives in two story home      Pt is right handed   Social Determinants of Health   Financial Resource Strain:   . Difficulty of Paying Living Expenses: Not on file  Food Insecurity:   . Worried About Charity fundraiser in the Last Year: Not on file  . Ran Out of Food in the Last Year: Not on file  Transportation Needs:   . Lack of Transportation (Medical): Not on file  . Lack of Transportation (Non-Medical): Not on file  Physical Activity:   . Days of Exercise per Week: Not on file  . Minutes of  Exercise per Session: Not on file  Stress:   . Feeling of Stress : Not on file  Social Connections:   . Frequency of Communication with Friends and Family: Not on file  . Frequency of Social Gatherings with Friends and Family: Not on file  . Attends Religious Services: Not on file  . Active Member of Clubs or Organizations: Not on file  . Attends Archivist Meetings: Not on file  . Marital Status: Not on file  Intimate Partner Violence:   . Fear of Current or Ex-Partner: Not on file  . Emotionally Abused: Not on file  . Physically Abused: Not on file  . Sexually Abused: Not on file      Allergies as of 05/01/2019      Reactions   Motrin [ibuprofen]    GI bleed   Pollen Extract    Statins    "delibitating pain all over body"   Sulfonamide Derivatives    Pt unsure  of reaction   Ciprofloxacin Swelling, Rash      Medication List       Accurate as of May 01, 2019  1:45 PM. If you have any questions, ask your nurse or doctor.        acetaminophen 325 MG tablet Commonly known as: TYLENOL Take 650 mg by mouth as needed.   azelastine 0.1 % nasal spray Commonly known as: ASTELIN Place 2 sprays into both nostrils at bedtime.   B COMPLEX VITAMINS PO Take by mouth daily.   Bepreve 1.5 % Soln Generic drug: Bepotastine Besilate Place 1 drop into both eyes 2 (two) times daily as needed.   buPROPion 300 MG 24 hr tablet Commonly known as: WELLBUTRIN XL Take 1 tablet (300 mg total) by mouth daily.   calcipotriene 0.005 % cream Commonly known as: DOVONOX Apply topically daily.   cetirizine 10 MG tablet Commonly known as: ZYRTEC Take 10 mg by mouth daily.   clobetasol 0.05 % external solution Commonly known as: TEMOVATE Apply 1 application topically 2 (two) times daily as needed.   Fish Oil 1000 MG Cpdr Take by mouth.   guaiFENesin 600 MG 12 hr tablet Commonly known as: MUCINEX Take by mouth as needed.   methocarbamol 500 MG tablet Commonly known as: ROBAXIN Take 1 tablet (500 mg total) by mouth 3 (three) times daily as needed.   ondansetron 4 MG tablet Commonly known as: Zofran Take 1 tablet (4 mg total) by mouth every 8 (eight) hours as needed for nausea or vomiting.   polyethylene glycol 17 g packet Commonly known as: MIRALAX / GLYCOLAX Take 17 g by mouth daily as needed.   predniSONE 10 MG tablet Commonly known as: DELTASONE 4 tablets x 2 days, 3 tabs x 2 days, 2 tabs x 2 days, 1 tab x 2 days   sertraline 100 MG tablet Commonly known as: ZOLOFT Take 1 tablet (100 mg total) by mouth daily.   SUMAtriptan 100 MG tablet Commonly known as: IMITREX TAKE 1 TABLET BY MOUTH AT EARLIEST ONSET OF HEADACHE. MAY REPEAT ONCE IN 2 HOURS IF HEADACHE PERSISTS OR RECURS.   traMADol 50 MG tablet Commonly known as: ULTRAM TAKE ONE  TABLET BY MOUTH EVERY 6 HOURS AS NEEDED   Travoprost (BAK Free) 0.004 % Soln ophthalmic solution Commonly known as: TRAVATAN Place 1 drop into the right eye at bedtime.   triamcinolone cream 0.1 % Commonly known as: KENALOG Apply 1 application topically as needed.   verapamil 180 MG CR tablet  Commonly known as: CALAN-SR Take 1 tablet (180 mg total) by mouth at bedtime.   Vitamin D (Cholecalciferol) 25 MCG (1000 UT) Tabs Take by mouth daily.   zolpidem 5 MG tablet Commonly known as: AMBIEN TAKE 1 TABLET BY MOUTH EVERY NIGHT AT BEDTIME AS NEEDED FOR SLEEP           Objective:   Physical Exam There were no vitals taken for this visit. This is a virtual video visit, she is alert oriented x3, in no apparent distress. She looks worried about her husband but not depressed    Assessment      Assessment Hyperlipidemia: Lipitor causes myalgias, declined it other statins, zetia didn't help Anxiety depression insomnia DJD Headaches Osteopenia: 07-2014 T score of -1.7, Rx   vitamin D PUD due to NSAIDs RLS - robaxin  prn Glaucoma Chronic constipation Skin psoriasis , Dx LSC (lichen )  ~ 123456 , sees derm    PLAN: GERD: Symptoms have been exacerbated for the last 4 weeks, she used to have only sporadic symptoms. No red flags such as change in the color of the stools, dysphagia, etc. Had a EGD in 2012, had a ulcer, H. pylori negative. Different options were discussed but we agreed on: Pantoprazole every morning before breakfast for 4 weeks and then as needed. Stop ibuprofen and reassess in 3 months If not better or symptoms resurface after PPIs she will be referred to GI. Anxiety, depression, insomnia: Husband is on hospice, fortunately he is getting all the help he needs, and Jamela is handling the stress okay RTC 3 months   I discussed the assessment and treatment plan with the patient. The patient was provided an opportunity to ask questions and all were answered. The patient  agreed with the plan and demonstrated an understanding of the instructions.   The patient was advised to call back or seek an in-person evaluation if the symptoms worsen or if the condition fails to improve as anticipated.

## 2019-05-02 DIAGNOSIS — K219 Gastro-esophageal reflux disease without esophagitis: Secondary | ICD-10-CM | POA: Insufficient documentation

## 2019-05-02 NOTE — Assessment & Plan Note (Signed)
GERD: Symptoms have been exacerbated for the last 4 weeks, she used to have only sporadic symptoms. No red flags such as change in the color of the stools, dysphagia, etc. Had a EGD in 2012, had a ulcer, H. pylori negative. Different options were discussed but we agreed on: Pantoprazole every morning before breakfast for 4 weeks and then as needed. Stop ibuprofen and reassess in 3 months If not better or symptoms resurface after PPIs she will be referred to GI. Anxiety, depression, insomnia: Husband is on hospice, fortunately he is getting all the help he needs, and Jakeia is handling the stress okay RTC 3 months

## 2019-05-16 ENCOUNTER — Ambulatory Visit (INDEPENDENT_AMBULATORY_CARE_PROVIDER_SITE_OTHER)
Admission: RE | Admit: 2019-05-16 | Discharge: 2019-05-16 | Disposition: A | Discharge status: Home / Self Care | Payer: Medicare PPO | Source: Ambulatory Visit

## 2019-05-16 DIAGNOSIS — K121 Other forms of stomatitis: Secondary | ICD-10-CM

## 2019-05-16 MED ORDER — NYSTATIN 100000 UNIT/ML MT SUSP: 500000.0000 [IU] | Freq: Four times a day (QID) | OROMUCOSAL | 0 refills | Status: DC

## 2019-05-16 NOTE — Discharge Instructions (Addendum)
Use the nystatin oral suspension as directed.  Take Tylenol as needed for fever or discomfort.  Come to the urgent care to be seen in person or follow-up with your primary care provider if your symptoms are not improving.

## 2019-05-16 NOTE — ED Provider Notes (Signed)
Virtual Visit via Video Note:  Cynthia [name]  initiated request for Telemedicine visit with Cynthia [name] team. I connected with Cynthia [name]  on 05/16/2019 at 12:03 PM  for a synchronized telemedicine visit using a video enabled HIPPA compliant telemedicine application. I verified that I am speaking with Cynthia [name]  using two identifiers. Cynthia Balloon, NP  was physically located in a [name] site and Cynthia [name] was located at a different location.   The limitations of evaluation and management by telemedicine as well as the availability of in-person appointments were discussed. Patient was informed that she  may incur a bill ( including co-pay) for this virtual visit encounter. Cynthia [name]  expressed understanding and gave verbal consent to proceed with virtual visit.     History of Present Illness:Cynthia [name]  is a 73 y.o. female presents for evaluation of sores in mouth and on lips x 1 week.  She also reports sinus pain and congestion x 2 days.  Her husband passed away 2 days ago.  She reports temp of 100.1 this morning.  She denies cough or shortness of breath.  She denies white plaques in her mouth, difficulty swallowing, or other symptoms.     Allergies  Allergen Reactions  . Motrin [Ibuprofen]     GI bleed  . Pollen Extract   . Statins     "delibitating pain all over body"  . Sulfonamide Derivatives     Pt unsure of reaction  . Ciprofloxacin Swelling and Rash     Past Medical History:  Diagnosis Date  . Allergy   . Anxiety   . Arthritis   . Blood transfusion without reported diagnosis   . Constipation, chronic   . DEGENERATIVE JOINT DISEASE 06/02/2010  . Depression   . Glaucoma 2014   Right eye  . Headache(784.0)   . Hyperlipidemia    hx of  . Osteopenia   . Psoriasis   . PUD (peptic ulcer disease) 2012   Upper GI bleed d/t NSAIDs  . RLS (restless legs syndrome)    robaxin prn     Social History   Tobacco Use  .  Smoking status: Never Smoker  . Smokeless tobacco: Never Used  Substance Use Topics  . Alcohol use: Yes    Comment: socially  . Drug use: No        Observations/Objective: Physical Exam  VITALS: Patient reports temp 100.1. GENERAL: Alert, appears well and in no acute distress. HEENT: Atraumatic. NECK: Normal movements of the head and neck. CARDIOPULMONARY: No increased WOB. Speaking in clear sentences. I:E ratio WNL.  MS: Moves all visible extremities without noticeable abnormality. PSYCH: Pleasant and cooperative, well-groomed. Speech normal rate and rhythm. Affect is appropriate. Insight and judgement are appropriate. Attention is focused, linear, and appropriate.  NEURO: CN grossly intact. Oriented as arrived to appointment on time with no prompting. Moves both UE equally.  SKIN: Several red papules noted on right side of upper and lower lips and on tongue.   Assessment and Plan:    ICD-10-CM   1. Stomatitis  K12.1        Follow Up Instructions: Treating with Nystatin oral suspension.  Instructed patient to come here to be seen in person or follow-up with her PCP if her symptoms are not improving.  Patient agrees to this plan of [name].      I discussed the assessment and treatment plan with the patient. The  patient was provided an opportunity to ask questions and all were answered. The patient agreed with the plan and demonstrated an understanding of the instructions.   The patient was advised to call back or seek an in-person evaluation if the symptoms worsen or if the condition fails to improve as anticipated.      Cynthia Balloon, NP  05/16/2019 12:03 PM         Cynthia Balloon, NP 05/16/19 [phone]

## 2019-05-19 ENCOUNTER — Other Ambulatory Visit: Payer: Self-pay

## 2019-05-19 ENCOUNTER — Ambulatory Visit: Payer: Medicare PPO | Attending: Internal Medicine

## 2019-05-19 ENCOUNTER — Ambulatory Visit (INDEPENDENT_AMBULATORY_CARE_PROVIDER_SITE_OTHER): Payer: Medicare PPO | Admitting: Internal Medicine

## 2019-05-19 DIAGNOSIS — B001 Herpesviral vesicular dermatitis: Secondary | ICD-10-CM

## 2019-05-19 DIAGNOSIS — F329 Major depressive disorder, single episode, unspecified: Secondary | ICD-10-CM | POA: Diagnosis not present

## 2019-05-19 DIAGNOSIS — G4700 Insomnia, unspecified: Secondary | ICD-10-CM | POA: Insufficient documentation

## 2019-05-19 DIAGNOSIS — Z20822 Contact with and (suspected) exposure to covid-19: Secondary | ICD-10-CM

## 2019-05-19 DIAGNOSIS — F419 Anxiety disorder, unspecified: Secondary | ICD-10-CM

## 2019-05-19 DIAGNOSIS — B349 Viral infection, unspecified: Secondary | ICD-10-CM | POA: Diagnosis not present

## 2019-05-19 DIAGNOSIS — R69 Illness, unspecified: Secondary | ICD-10-CM | POA: Diagnosis not present

## 2019-05-19 MED ORDER — VALACYCLOVIR HCL 1 G PO TABS: 1000.0000 mg | ORAL_TABLET | Freq: Three times a day (TID) | ORAL | 0 refills | Status: DC

## 2019-05-19 MED ORDER — TRAMADOL HCL 50 MG PO TABS: 50.0000 mg | ORAL_TABLET | Freq: Four times a day (QID) | ORAL | 0 refills | Status: DC | PRN

## 2019-05-19 NOTE — Assessment & Plan Note (Signed)
Cold sores, herpetic infection: As described above, rx  Valtrex 1 g 3 times daily for 1 week.  Definitely call if not improving Anxiety, depression, insomnia: Lost her husband last month, he was my patient, condolences provided.  She is doing okay for the circumstances, we will continue sertraline, Wellbutrin. Viral syndrome: Low-grade fever, transient GI symptoms, Covid cases in the community are very high, recommend to check for Covid.  Order sent, instructions provided. Definitely stay hydrated and watch symptoms closely.  If possible get a pulse oximeter, 94% is a critical number, patient is aware. DJD: Refill Ultram.  PDMP reviewed and okay.

## 2019-05-19 NOTE — Progress Notes (Signed)
Subjective:    Patient ID: Cynthia [name], female    DOB: 26-Jun-1945, 74 y.o.   MRN: [id number]  DOS:  05/19/2019 Type of visit - description: Virtual Visit via Video Note  I connected with the above patient  by a video enabled telemedicine application and verified that I am speaking with the correct person using two identifiers.   THIS ENCOUNTER IS A VIRTUAL VISIT DUE TO COVID-19 - PATIENT WAS NOT SEEN IN THE OFFICE. PATIENT HAS CONSENTED TO VIRTUAL VISIT / TELEMEDICINE VISIT   Location of patient: home  Location of provider: office  I discussed the limitations of evaluation and management by telemedicine and the availability of in person appointments. The patient expressed understanding and agreed to proceed.   History of Present Illness: Acute We discussed several issues 3 days history of a painful, blistery rash, inside her mouth (tongue, cheeks) all of it on the right side except for 1 blister on the left. At the same time has noted external skin rash, blistery, red and painful on the right cheek. Eyes are  not affected  Lost her husband last month, obviously affected but doing ok.  For the last 3 days is having a low-grade fever as high as 100.3, this morning 99.1 degrees. Had nausea, vomiting, diarrhea.  That is resolved.  Denies cough, runny nose, chest pain no difficulty breathing    Past Medical History:  Diagnosis Date  . Allergy   . Anxiety   . Arthritis   . Blood transfusion without reported diagnosis   . Constipation, chronic   . DEGENERATIVE JOINT DISEASE 06/02/2010  . Depression   . Glaucoma 2014   Right eye  . Headache(784.0)   . Hyperlipidemia    hx of  . Osteopenia   . Psoriasis   . PUD (peptic ulcer disease) 2012   Upper GI bleed d/t NSAIDs  . RLS (restless legs syndrome)    robaxin prn    Past Surgical History:  Procedure Laterality Date  . ANKLE FRACTURE SURGERY Left    surgery 12-11 (LEFT)  . [place]    Social History    Socioeconomic History  . Marital status: Married    Spouse name: Not on file  . Number of children: 1  . Years of education: Not on file  . Highest education level: Doctorate  Occupational History  . Occupation: retired Environmental consultant: RETIRED  Tobacco Use  . Smoking status: Never Smoker  . Smokeless tobacco: Never Used  Substance and Sexual Activity  . Alcohol use: Yes    Comment: socially  . Drug use: No  . Sexual activity: Not on file  Other Topics Concern  . Not on file  Social History Narrative   2 step children, 1 child , [place]- patient and her husband, he recently stop driving        Lives in two story home      Pt is right handed   Social Determinants of Health   Financial Resource Strain:   . Difficulty of Paying Living Expenses: Not on file  Food Insecurity:   . Worried About Charity fundraiser in the Last Year: Not on file  . Ran Out of Food in the Last Year: Not on file  Transportation Needs:   . Lack of Transportation (Medical): Not on file  . Lack of Transportation (Non-Medical): Not on file  Physical Activity:   . Days  of Exercise per Week: Not on file  . Minutes of Exercise per Session: Not on file  Stress:   . Feeling of Stress : Not on file  Social Connections:   . Frequency of Communication with Friends and Family: Not on file  . Frequency of Social Gatherings with Friends and Family: Not on file  . Attends Religious Services: Not on file  . Active Member of Clubs or Organizations: Not on file  . Attends Archivist Meetings: Not on file  . Marital Status: Not on file  Intimate Partner Violence:   . Fear of Current or Ex-Partner: Not on file  . Emotionally Abused: Not on file  . Physically Abused: Not on file  . Sexually Abused: Not on file      Allergies as of 05/19/2019      Reactions   Motrin [ibuprofen]    GI bleed   Pollen Extract    Statins    "delibitating pain all over body"    Sulfonamide Derivatives    Pt unsure of reaction   Ciprofloxacin Swelling, Rash      Medication List       Accurate as of May 19, 2019  9:26 AM. If you have any questions, ask your nurse or doctor.        acetaminophen 325 MG tablet Commonly known as: TYLENOL Take 650 mg by mouth as needed.   azelastine 0.1 % nasal spray Commonly known as: ASTELIN Place 2 sprays into both nostrils at bedtime.   B COMPLEX VITAMINS PO Take by mouth daily.   Bepreve 1.5 % Soln Generic drug: Bepotastine Besilate Place 1 drop into both eyes 2 (two) times daily as needed.   buPROPion 300 MG 24 hr tablet Commonly known as: WELLBUTRIN XL Take 1 tablet (300 mg total) by mouth daily.   calcipotriene 0.005 % cream Commonly known as: DOVONOX Apply topically daily.   cetirizine 10 MG tablet Commonly known as: ZYRTEC Take 10 mg by mouth daily.   clobetasol 0.05 % external solution Commonly known as: TEMOVATE Apply 1 application topically 2 (two) times daily as needed.   Fish Oil 1000 MG Cpdr Take by mouth.   guaiFENesin 600 MG 12 hr tablet Commonly known as: MUCINEX Take by mouth as needed.   methocarbamol 500 MG tablet Commonly known as: ROBAXIN Take 1 tablet (500 mg total) by mouth 3 (three) times daily as needed.   nystatin 100000 UNIT/ML suspension Commonly known as: MYCOSTATIN Take 5 mLs (500,000 Units total) by mouth 4 (four) times daily.   ondansetron 4 MG tablet Commonly known as: Zofran Take 1 tablet (4 mg total) by mouth every 8 (eight) hours as needed for nausea or vomiting.   pantoprazole 40 MG tablet Commonly known as: PROTONIX Take 1 tablet (40 mg total) by mouth daily.   polyethylene glycol 17 g packet Commonly known as: MIRALAX / GLYCOLAX Take 17 g by mouth daily as needed.   sertraline 100 MG tablet Commonly known as: ZOLOFT Take 1 tablet (100 mg total) by mouth daily.   SUMAtriptan 100 MG tablet Commonly known as: IMITREX TAKE 1 TABLET BY MOUTH AT  EARLIEST ONSET OF HEADACHE. MAY REPEAT ONCE IN 2 HOURS IF HEADACHE PERSISTS OR RECURS.   traMADol 50 MG tablet Commonly known as: ULTRAM TAKE ONE TABLET BY MOUTH EVERY 6 HOURS AS NEEDED   Travoprost (BAK Free) 0.004 % Soln ophthalmic solution Commonly known as: TRAVATAN Place 1 drop into the right eye at bedtime.  triamcinolone cream 0.1 % Commonly known as: KENALOG Apply 1 application topically as needed.   verapamil 180 MG CR tablet Commonly known as: CALAN-SR Take 1 tablet (180 mg total) by mouth at bedtime.   Vitamin D (Cholecalciferol) 25 MCG (1000 UT) Tabs Take by mouth daily.   zolpidem 5 MG tablet Commonly known as: AMBIEN TAKE 1 TABLET BY MOUTH EVERY NIGHT AT BEDTIME AS NEEDED FOR SLEEP           Objective:   Physical Exam There were no vitals taken for this visit. This is a virtual video visit, she is alert oriented x3, in no physical or emotional distress, I did notice a rash on the right side of her face, small, round red lesions.    Assessment    Assessment Hyperlipidemia: Lipitor causes myalgias, declined it other statins, zetia didn't help Anxiety depression insomnia DJD Headaches Osteopenia: 07-2014 T score of -1.7, Rx   vitamin D PUD due to NSAIDs RLS - robaxin  prn Glaucoma Chronic constipation Skin psoriasis , Dx LSC (lichen )  ~ 123456 , sees derm    PLAN: Cold sores, herpetic infection: As described above, rx  Valtrex 1 g 3 times daily for 1 week.  Definitely call if not improving Anxiety, depression, insomnia: Lost her husband last month, he was my patient, condolences provided.  She is doing okay for the circumstances, we will continue sertraline, Wellbutrin. Viral syndrome: Low-grade fever, transient GI symptoms, Covid cases in the community are very high, recommend to check for Covid.  Order sent, instructions provided. Definitely stay hydrated and watch symptoms closely.  If possible get a pulse oximeter, 94% is a critical number, patient  is aware. DJD: Refill Ultram.  PDMP reviewed and okay.   I discussed the assessment and treatment plan with the patient. The patient was provided an opportunity to ask questions and all were answered. The patient agreed with the plan and demonstrated an understanding of the instructions.   The patient was advised to call back or seek an in-person evaluation if the symptoms worsen or if the condition fails to improve as anticipated.

## 2019-05-20 LAB — NOVEL CORONAVIRUS, NAA: SARS-CoV-2, NAA: NOT DETECTED

## 2019-05-21 ENCOUNTER — Other Ambulatory Visit: Payer: Self-pay | Admitting: Internal Medicine

## 2019-05-21 ENCOUNTER — Encounter: Payer: Self-pay | Admitting: Internal Medicine

## 2019-05-22 NOTE — Telephone Encounter (Signed)
Last OV 05/19/19 Last refill 04/23/19 #15/0 Next OV not scheduled

## 2019-06-06 ENCOUNTER — Ambulatory Visit: Payer: Medicare PPO | Attending: Internal Medicine

## 2019-06-06 DIAGNOSIS — Z23 Encounter for immunization: Secondary | ICD-10-CM | POA: Insufficient documentation

## 2019-06-06 NOTE — Progress Notes (Signed)
   Covid-19 Vaccination Clinic  Name:  Cynthia [name]    MRN: [id number] DOB: [birth year]  06/06/2019  [name] was observed post Covid-19 immunization for 15 minutes without incidence. She was provided with Vaccine Information Sheet and instruction to access the V-Safe system.   [name] was instructed to call 911 with any severe reactions post vaccine: Marland Kitchen Difficulty breathing  . Swelling of your face and throat  . A fast heartbeat  . A bad rash all over your body  . Dizziness and weakness    Immunizations Administered    Name Date Dose VIS Date Route   Pfizer COVID-19 Vaccine 06/06/2019  4:54 PM 0.3 mL 04/25/2019 Intramuscular   Manufacturer: [place]   Lot: [id number]   [place]: [id number]

## 2019-06-19 ENCOUNTER — Other Ambulatory Visit: Payer: Self-pay | Admitting: Neurology

## 2019-06-19 ENCOUNTER — Encounter: Payer: Self-pay | Admitting: *Deleted

## 2019-06-19 NOTE — Progress Notes (Signed)
I ran a PA through covermymeds based on the following conversation: Message Received: Today Message Contents  Pieter Partridge, DO  Monica Becton,   Last month, I prescribed this patient tramadol. However, she said that her insurance changed to [facility] and that they need prior authorization. Would you be able to help out? Thank you     Chrisie Boyer [place]: [id number] help? Call us at [phone] Outcome Additional Information Required Available without authorization. Drug Ultram 50MG  tablets Form [employer] Electronic PA Form

## 2019-06-26 DIAGNOSIS — H401131 Primary open-angle glaucoma, bilateral, mild stage: Secondary | ICD-10-CM | POA: Diagnosis not present

## 2019-06-27 ENCOUNTER — Ambulatory Visit: Payer: Medicare PPO | Attending: Internal Medicine

## 2019-06-27 ENCOUNTER — Encounter: Payer: Self-pay | Admitting: Internal Medicine

## 2019-06-27 DIAGNOSIS — Z23 Encounter for immunization: Secondary | ICD-10-CM

## 2019-06-27 NOTE — Progress Notes (Signed)
   Covid-19 Vaccination Clinic  Name:  Cynthia [name]    MRN: [id number] DOB: 19-Oct-1945  06/27/2019  [name] was observed post Covid-19 immunization for 15 minutes without incidence. She was provided with Vaccine Information Sheet and instruction to access the V-Safe system.   [name] was instructed to call 911 with any severe reactions post vaccine: Marland Kitchen Difficulty breathing  . Swelling of your face and throat  . A fast heartbeat  . A bad rash all over your body  . Dizziness and weakness    Immunizations Administered    Name Date Dose VIS Date Route   Pfizer COVID-19 Vaccine 06/27/2019  4:34 PM 0.3 mL 04/25/2019 Intramuscular   Manufacturer: [employer]   Lot: [id number]   [employer]: [id number]

## 2019-07-04 ENCOUNTER — Telehealth: Payer: Self-pay | Admitting: Internal Medicine

## 2019-07-04 NOTE — Telephone Encounter (Signed)
Ambien refill.   Last OV: 05/19/2019 Last Fill: 12/31/2018 #30 and 3RF

## 2019-07-04 NOTE — Telephone Encounter (Signed)
PDMP okay, Rx sent 

## 2019-07-07 DIAGNOSIS — L408 Other psoriasis: Secondary | ICD-10-CM | POA: Diagnosis not present

## 2019-07-07 DIAGNOSIS — L400 Psoriasis vulgaris: Secondary | ICD-10-CM | POA: Diagnosis not present

## 2019-07-26 ENCOUNTER — Other Ambulatory Visit: Payer: Self-pay | Admitting: Internal Medicine

## 2019-08-18 DIAGNOSIS — G43909 Migraine, unspecified, not intractable, without status migrainosus: Secondary | ICD-10-CM | POA: Diagnosis not present

## 2019-08-18 DIAGNOSIS — L409 Psoriasis, unspecified: Secondary | ICD-10-CM | POA: Diagnosis not present

## 2019-08-18 DIAGNOSIS — Z604 Social exclusion and rejection: Secondary | ICD-10-CM | POA: Diagnosis not present

## 2019-08-18 DIAGNOSIS — Z823 Family history of stroke: Secondary | ICD-10-CM | POA: Diagnosis not present

## 2019-08-18 DIAGNOSIS — F329 Major depressive disorder, single episode, unspecified: Secondary | ICD-10-CM | POA: Diagnosis not present

## 2019-08-18 DIAGNOSIS — G8929 Other chronic pain: Secondary | ICD-10-CM | POA: Diagnosis not present

## 2019-08-18 DIAGNOSIS — L309 Dermatitis, unspecified: Secondary | ICD-10-CM | POA: Diagnosis not present

## 2019-08-18 DIAGNOSIS — K219 Gastro-esophageal reflux disease without esophagitis: Secondary | ICD-10-CM | POA: Diagnosis not present

## 2019-08-18 DIAGNOSIS — Z634 Disappearance and death of family member: Secondary | ICD-10-CM | POA: Diagnosis not present

## 2019-08-18 DIAGNOSIS — H409 Unspecified glaucoma: Secondary | ICD-10-CM | POA: Diagnosis not present

## 2019-08-18 DIAGNOSIS — F349 Persistent mood [affective] disorder, unspecified: Secondary | ICD-10-CM | POA: Diagnosis not present

## 2019-08-18 DIAGNOSIS — F4323 Adjustment disorder with mixed anxiety and depressed mood: Secondary | ICD-10-CM | POA: Diagnosis not present

## 2019-08-18 DIAGNOSIS — Z881 Allergy status to other antibiotic agents status: Secondary | ICD-10-CM | POA: Diagnosis not present

## 2019-08-18 DIAGNOSIS — Z9181 History of falling: Secondary | ICD-10-CM | POA: Diagnosis not present

## 2019-08-18 DIAGNOSIS — T7840XD Allergy, unspecified, subsequent encounter: Secondary | ICD-10-CM | POA: Diagnosis not present

## 2019-08-18 DIAGNOSIS — Z79891 Long term (current) use of opiate analgesic: Secondary | ICD-10-CM | POA: Diagnosis not present

## 2019-08-25 ENCOUNTER — Other Ambulatory Visit: Payer: Self-pay | Admitting: Internal Medicine

## 2019-08-25 NOTE — Progress Notes (Deleted)
Virtual Visit via Video Note The purpose of this virtual visit is to provide medical care while limiting exposure to the novel coronavirus.    Consent was obtained for video visit:  Yes.   Answered questions that patient had about telehealth interaction:  Yes.   I discussed the limitations, risks, security and privacy concerns of performing an evaluation and management service by telemedicine. I also discussed with the patient that there may be a patient responsible charge related to this service. The patient expressed understanding and agreed to proceed.  Pt location: Home Physician Location: office Name of referring provider:  Colon Branch, MD I connected with Cynthia [name] at patients initiation/request on 08/28/2019 at  8:50 AM EDT by video enabled telemedicine application and verified that I am speaking with the correct person using two identifiers. Pt MRN:  [id number] Pt DOB:  [birth year] Video Participants:  Cynthia [name]   History of Present Illness:  Cynthia [name] is a 74 year old right-handed woman with depression, glaucoma and degenerative joint disease who follows up for hypnic headache.  UPDATE: Her husband passed away ***.  Headaches *** Intensity:  *** Duration:  *** Frequency:  ***  Current NSAIDS:no Current analgesics:acetaminophen-caffeine Current triptans:none Current ergotamine:no Current anti-emetic:Zofran Current muscle relaxants:Robaxin 500mg TID Current anti-anxiolytic:no Current sleep aide:Ambien Current Antihypertensive medications:verapamil CR 180mg  daily Current Antidepressant medications:Sertraline100mg , bupropion300mg  Current Anticonvulsant medications:no Current anti-CGRP:no Current Vitamins/Herbal/Supplements:B complex, Mg Current Antihistamines/Decongestants:Mucinex, Astelin Other therapy:1 cup black coffee with melatonin 10mg  at bedtime  Depression and anxiety: Yes Other pain: Joint pain  HISTORY: She  started having headaches infrequently in 2015but progressively became more frequent in 2016.Varies, but they are usually left sided, involving the ear, but also may be right sided (involving the sinuses) or in band-like distribution.It is both throbbing and non-throbbing.Initially, it is usually 5/10 but severe episodes are 7-8/10.Sometimes there is nausea.There is phonophobia.There is noassociatedphotophobia,visual disturbanceor unilateral numbness or weakness.Initially, it lasts a couple of hours and occuring 15 days out of the month (3 to 4 days severe).It wakes her up at 4:30 to 5 AM.She reports sensation of congestion but no runny nose.CT of sinuses showed opacified right sphenoid sinus and ethmoid air cell.She was treated a couple of times with Augmentin and prednisone, which was effective for a while but headaches then returned.She cannot think of a trigger. They are worse during the summer.There are no aggravating or relieving factors.MRI of brain from 06/10/15 was normal. Sed Rate from 12/27/15 was 10.  Past NSAIDS:ibuprofen (ulcers) Past analgesics:Tylenol Past abortive triptans:sumatriptan (advised to discontinue due to age) Past abortive ergotamine:no Past muscle relaxants:no Past anti-emetic:no Past antihypertensive medications:no Past antidepressant medications:Nortriptyline 50mg  Past anticonvulsant medications:topiramate 25mg  (concerned about glaucoma) Past anti-CGRP:no Past vitamins/Herbal/Supplements:no Past antihistamines/decongestants:no Other past therapies:none  Past Medical History: Past Medical History:  Diagnosis Date  . Allergy   . Anxiety   . Arthritis   . Blood transfusion without reported diagnosis   . Constipation, chronic   . DEGENERATIVE JOINT DISEASE 06/02/2010  . Depression   . Glaucoma 2014   Right eye  . Headache(784.0)   . Hyperlipidemia    hx of  . Osteopenia   . Psoriasis   . PUD  (peptic ulcer disease) 2012   Upper GI bleed d/t NSAIDs  . RLS (restless legs syndrome)    robaxin prn    Medications: Outpatient Encounter Medications as of 08/28/2019  Medication Sig Note  . acetaminophen (TYLENOL) 325 MG tablet Take 650 mg by mouth as needed.   Marland Kitchen  azelastine (ASTELIN) 0.1 % nasal spray Place 2 sprays into both nostrils at bedtime.   . B COMPLEX VITAMINS PO Take by mouth daily.   . Bepotastine Besilate (BEPREVE) 1.5 % SOLN Place 1 drop into both eyes 2 (two) times daily as needed.    Marland Kitchen buPROPion (WELLBUTRIN XL) 300 MG 24 hr tablet Take 1 tablet (300 mg total) by mouth daily.   . calcipotriene (DOVONOX) 0.005 % cream Apply topically daily.    . cetirizine (ZYRTEC) 10 MG tablet Take 10 mg by mouth daily.     . clobetasol (TEMOVATE) 0.05 % external solution Apply 1 application topically 2 (two) times daily as needed.    Marland Kitchen guaiFENesin (MUCINEX) 600 MG 12 hr tablet Take by mouth as needed. 01/24/2017: PRN  . methocarbamol (ROBAXIN) 500 MG tablet Take 1 tablet (500 mg total) by mouth 3 (three) times daily as needed.   . nystatin (MYCOSTATIN) 100000 UNIT/ML suspension Take 5 mLs (500,000 Units total) by mouth 4 (four) times daily.   . Omega-3 Fatty Acids (FISH OIL) 1000 MG CPDR Take by mouth.     . ondansetron (ZOFRAN) 4 MG tablet Take 1 tablet (4 mg total) by mouth every 8 (eight) hours as needed for nausea or vomiting.   . pantoprazole (PROTONIX) 40 MG tablet Take 1 tablet (40 mg total) by mouth daily.   . polyethylene glycol (MIRALAX / GLYCOLAX) packet Take 17 g by mouth daily as needed.   . sertraline (ZOLOFT) 100 MG tablet Take 1 tablet (100 mg total) by mouth daily.   . SUMAtriptan (IMITREX) 100 MG tablet TAKE 1 TABLET BY MOUTH AT EARLIEST ONSET OF HEADACHE. MAY REPEAT ONCE IN 2 HOURS IF HEADACHE PERSISTS OR RECURS.   . traMADol (ULTRAM) 50 MG tablet Take 1 tablet (50 mg total) by mouth every 6 (six) hours as needed.   . Travoprost, BAK Free, (TRAVATAN) 0.004 % SOLN  ophthalmic solution Place 1 drop into the right eye at bedtime.   . valACYclovir (VALTREX) 1000 MG tablet Take 1 tablet (1,000 mg total) by mouth 3 (three) times daily.   . verapamil (CALAN-SR) 180 MG CR tablet Take 1 tablet (180 mg total) by mouth at bedtime.   . Vitamin D, Cholecalciferol, 25 MCG (1000 UT) TABS Take by mouth daily.   Marland Kitchen zolpidem (AMBIEN) 5 MG tablet Take 1 tablet (5 mg total) by mouth at bedtime as needed for sleep.    No facility-administered encounter medications on file as of 08/28/2019.    Allergies: Allergies  Allergen Reactions  . Motrin [Ibuprofen]     GI bleed  . Pollen Extract   . Statins     "delibitating pain all over body"  . Sulfonamide Derivatives     Pt unsure of reaction  . Ciprofloxacin Swelling and Rash    Family History: Family History  Problem Relation Age of Onset  . Aneurysm Mother        brain  . Celiac disease Brother   . Diabetes Maternal Grandfather   . Hypertension Neg Hx   . Breast cancer Neg Hx   . Coronary artery disease Neg Hx   . Colon cancer Neg Hx   . Esophageal cancer Neg Hx   . Stomach cancer Neg Hx   . Rectal cancer Neg Hx     Social History: Social History   Socioeconomic History  . Marital status: Widowed    Spouse name: Not on file  . Number of children: 1  . Years of  education: Not on file  . Highest education level: Doctorate  Occupational History  . Occupation: retired Environmental consultant: RETIRED  Tobacco Use  . Smoking status: Never Smoker  . Smokeless tobacco: Never Used  Substance and Sexual Activity  . Alcohol use: Yes    Comment: socially  . Drug use: No  . Sexual activity: Not on file  Other Topics Concern  . Not on file  Social History Narrative   2 step children, 1 child , [place]- patient and her husband, he recently stop driving        Lives in two story home      Pt is right handed   Social Determinants of Health   Financial Resource Strain:   .  Difficulty of Paying Living Expenses:   Food Insecurity:   . Worried About Charity fundraiser in the Last Year:   . Arboriculturist in the Last Year:   Transportation Needs:   . Film/video editor (Medical):   Marland Kitchen Lack of Transportation (Non-Medical):   Physical Activity:   . Days of Exercise per Week:   . Minutes of Exercise per Session:   Stress:   . Feeling of Stress :   Social Connections:   . Frequency of Communication with Friends and Family:   . Frequency of Social Gatherings with Friends and Family:   . Attends Religious Services:   . Active Member of Clubs or Organizations:   . Attends Archivist Meetings:   Marland Kitchen Marital Status:   Intimate Partner Violence:   . Fear of Current or Ex-Partner:   . Emotionally Abused:   Marland Kitchen Physically Abused:   . Sexually Abused:     Observations/Objective:   *** No acute distress.  Alert and oriented.  Speech fluent and not dysarthric.  Language intact.  Eyes orthophoric on primary gaze.  Face symmetric.  Assessment and Plan:   ***  Follow Up Instructions:    -I discussed the assessment and treatment plan with the patient. The patient was provided an opportunity to ask questions and all were answered. The patient agreed with the plan and demonstrated an understanding of the instructions.   The patient was advised to call back or seek an in-person evaluation if the symptoms worsen or if the condition fails to improve as anticipated.    Total Time spent in visit with the patient was:  ***, of which more than 50% of the time was spent in counseling and/or coordinating care on ***.   Pt understands and agrees with the plan of care outlined.     Dudley Major, DO

## 2019-08-28 ENCOUNTER — Telehealth: Payer: Medicare Other | Admitting: Neurology

## 2019-09-02 ENCOUNTER — Other Ambulatory Visit: Payer: Self-pay | Admitting: Internal Medicine

## 2019-09-05 ENCOUNTER — Telehealth (INDEPENDENT_AMBULATORY_CARE_PROVIDER_SITE_OTHER): Payer: Medicare PPO | Admitting: Family Medicine

## 2019-09-05 ENCOUNTER — Encounter: Payer: Self-pay | Admitting: Family Medicine

## 2019-09-05 ENCOUNTER — Other Ambulatory Visit: Payer: Self-pay

## 2019-09-05 VITALS — Temp 97.4°F | Ht 65.0 in | Wt 159.0 lb

## 2019-09-05 DIAGNOSIS — R197 Diarrhea, unspecified: Secondary | ICD-10-CM

## 2019-09-05 DIAGNOSIS — H9313 Tinnitus, bilateral: Secondary | ICD-10-CM | POA: Diagnosis not present

## 2019-09-05 DIAGNOSIS — K1379 Other lesions of oral mucosa: Secondary | ICD-10-CM

## 2019-09-05 MED ORDER — VALACYCLOVIR HCL 1 G PO TABS: 1000.0000 mg | ORAL_TABLET | Freq: Three times a day (TID) | ORAL | 0 refills | Status: DC

## 2019-09-05 MED ORDER — NYSTATIN 100000 UNIT/ML MT SUSP: 5.0000 mL | Freq: Four times a day (QID) | OROMUCOSAL | 0 refills | Status: DC

## 2019-09-05 NOTE — Progress Notes (Signed)
Virtual Visit via Video Note  I connected with Cynthia [name] on 09/05/19 at  1:40 PM EDT by a video enabled telemedicine application and verified that I am speaking with the correct person using two identifiers.  Location: Patient: home  Provider: office    I discussed the limitations of evaluation and management by telemedicine and the availability of in person appointments. The patient expressed understanding and agreed to proceed.  History of Present Illness: Pt is home and has had diarrhea for a week And nausea.  The diarhea has slowed down  She c/o ringing in her ears and sores in her throat and tongue -- she had the sores before and was given valtrex and it cleared up.   The ringing in her ears is new    Observations/Objective: Vitals:   09/05/19 1336  Temp: (!) 97.4 F (36.3 C)    Pt is in NAD  Assessment and Plan: 1. Mouth sores Nystatin swish and spit  Refill valtrex and f/u next week with pcp - valACYclovir (VALTREX) 1000 MG tablet; Take 1 tablet (1,000 mg total) by mouth 3 (three) times daily.  Dispense: 30 tablet; Refill: 0 - nystatin (MYCOSTATIN) 100000 UNIT/ML suspension; Take 5 mLs (500,000 Units total) by mouth 4 (four) times daily.  Dispense: 60 mL; Refill: 0  2. Tinnitus of both ears If it has not cleared up f/u for in person visit with pcp Referral was placed due to length of symptom but other symptoms will need to clear  - Ambulatory referral to ENT  3. Diarrhea, unspecified type Brat diet  Slowing down  Increase fiber  Try pepto/ immodium otc F/u next week prn   Follow Up Instructions:    I discussed the assessment and treatment plan with the patient. The patient was provided an opportunity to ask questions and all were answered. The patient agreed with the plan and demonstrated an understanding of the instructions.   The patient was advised to call back or seek an in-person evaluation if the symptoms worsen or if the condition fails to improve as  anticipated.  I provided 25 minutes of non-face-to-face time during this encounter.   Ann Held, DO

## 2019-09-13 DIAGNOSIS — G459 Transient cerebral ischemic attack, unspecified: Secondary | ICD-10-CM

## 2019-09-13 HISTORY — DX: Transient cerebral ischemic attack, unspecified: G45.9

## 2019-09-14 ENCOUNTER — Encounter (HOSPITAL_BASED_OUTPATIENT_CLINIC_OR_DEPARTMENT_OTHER): Payer: Self-pay | Admitting: *Deleted

## 2019-09-14 ENCOUNTER — Observation Stay (HOSPITAL_BASED_OUTPATIENT_CLINIC_OR_DEPARTMENT_OTHER)
Admission: EM | Admit: 2019-09-14 | Discharge: 2019-09-15 | Disposition: A | Discharge status: Home / Self Care | Payer: Medicare PPO | Attending: Emergency Medicine | Admitting: Emergency Medicine

## 2019-09-14 ENCOUNTER — Emergency Department (HOSPITAL_COMMUNITY): Payer: Medicare PPO

## 2019-09-14 ENCOUNTER — Emergency Department (HOSPITAL_BASED_OUTPATIENT_CLINIC_OR_DEPARTMENT_OTHER): Payer: Medicare PPO

## 2019-09-14 ENCOUNTER — Other Ambulatory Visit: Payer: Self-pay

## 2019-09-14 DIAGNOSIS — F419 Anxiety disorder, unspecified: Secondary | ICD-10-CM | POA: Diagnosis present

## 2019-09-14 DIAGNOSIS — Z79899 Other long term (current) drug therapy: Secondary | ICD-10-CM | POA: Insufficient documentation

## 2019-09-14 DIAGNOSIS — R0602 Shortness of breath: Secondary | ICD-10-CM | POA: Diagnosis not present

## 2019-09-14 DIAGNOSIS — R200 Anesthesia of skin: Secondary | ICD-10-CM | POA: Diagnosis not present

## 2019-09-14 DIAGNOSIS — I671 Cerebral aneurysm, nonruptured: Secondary | ICD-10-CM | POA: Diagnosis not present

## 2019-09-14 DIAGNOSIS — R29818 Other symptoms and signs involving the nervous system: Secondary | ICD-10-CM | POA: Diagnosis not present

## 2019-09-14 DIAGNOSIS — R930 Abnormal findings on diagnostic imaging of skull and head, not elsewhere classified: Secondary | ICD-10-CM | POA: Insufficient documentation

## 2019-09-14 DIAGNOSIS — Z20822 Contact with and (suspected) exposure to covid-19: Secondary | ICD-10-CM | POA: Insufficient documentation

## 2019-09-14 DIAGNOSIS — F32A Depression, unspecified: Secondary | ICD-10-CM | POA: Diagnosis present

## 2019-09-14 DIAGNOSIS — G459 Transient cerebral ischemic attack, unspecified: Principal | ICD-10-CM | POA: Diagnosis present

## 2019-09-14 DIAGNOSIS — F418 Other specified anxiety disorders: Secondary | ICD-10-CM | POA: Insufficient documentation

## 2019-09-14 DIAGNOSIS — G250 Essential tremor: Secondary | ICD-10-CM | POA: Diagnosis present

## 2019-09-14 DIAGNOSIS — F329 Major depressive disorder, single episode, unspecified: Secondary | ICD-10-CM | POA: Diagnosis present

## 2019-09-14 LAB — COMPREHENSIVE METABOLIC PANEL
ALT: 20 U/L (ref 0–44)
AST: 24 U/L (ref 15–41)
Albumin: 4.3 g/dL (ref 3.5–5.0)
Alkaline Phosphatase: 96 U/L (ref 38–126)
Anion gap: 9 (ref 5–15)
BUN: 11 mg/dL (ref 8–23)
CO2: 24 mmol/L (ref 22–32)
Calcium: 9.3 mg/dL (ref 8.9–10.3)
Chloride: 100 mmol/L (ref 98–111)
Creatinine, Ser: 0.75 mg/dL (ref 0.44–1.00)
GFR calc Af Amer: >60 mL/min (ref >60)
GFR calc non Af Amer: >60 mL/min (ref >60)
Glucose, Bld: 86 mg/dL (ref 70–99)
Potassium: 4.1 mmol/L (ref 3.5–5.1)
Sodium: 133 mmol/L — ABNORMAL LOW (ref 135–145)
Total Bilirubin: 0.5 mg/dL (ref 0.3–1.2)
Total Protein: 6.7 g/dL (ref 6.5–8.1)

## 2019-09-14 LAB — DIFFERENTIAL
Abs Immature Granulocytes: 0.02 10*3/uL (ref 0.00–0.07)
Basophils Absolute: 0.1 10*3/uL (ref 0.0–0.1)
Basophils Relative: 1 %
Eosinophils Absolute: 0.6 10*3/uL — ABNORMAL HIGH (ref 0.0–0.5)
Eosinophils Relative: 8 %
Immature Granulocytes: 0 %
Lymphocytes Relative: 25 %
Lymphs Abs: 1.9 10*3/uL (ref 0.7–4.0)
Monocytes Absolute: 0.9 10*3/uL (ref 0.1–1.0)
Monocytes Relative: 12 %
Neutro Abs: 4 10*3/uL (ref 1.7–7.7)
Neutrophils Relative %: 54 %

## 2019-09-14 LAB — CBC
HCT: 37.5 % (ref 36.0–46.0)
Hemoglobin: 12.7 g/dL (ref 12.0–15.0)
MCH: 31.5 pg (ref 26.0–34.0)
MCHC: 33.9 g/dL (ref 30.0–36.0)
MCV: 93.1 fL (ref 80.0–100.0)
Platelets: 343 10*3/uL (ref 150–400)
RBC: 4.03 MIL/uL (ref 3.87–5.11)
RDW: 13.4 % (ref 11.5–15.5)
WBC: 7.6 10*3/uL (ref 4.0–10.5)
nRBC: 0 % (ref 0.0–0.2)

## 2019-09-14 LAB — APTT: aPTT: 29 seconds (ref 24–36)

## 2019-09-14 LAB — PROTIME-INR
INR: 0.9 (ref 0.8–1.2)
Prothrombin Time: 11.6 seconds (ref 11.4–15.2)

## 2019-09-14 LAB — ETHANOL: Alcohol, Ethyl (B): <10 mg/dL (ref <10)

## 2019-09-14 LAB — TROPONIN I (HIGH SENSITIVITY): Troponin I (High Sensitivity): 3 ng/L (ref <18)

## 2019-09-14 MED ORDER — GADOBUTROL 1 MMOL/ML IV SOLN
7.5000 mL | Freq: Once | INTRAVENOUS | Status: AC | PRN
  Administered 2019-09-14: 7.5 mL via INTRAVENOUS

## 2019-09-14 NOTE — ED Notes (Signed)
Dr. Gilford Raid updated on pt presenting concerns and given EKG to review

## 2019-09-14 NOTE — ED Notes (Signed)
To mri 

## 2019-09-14 NOTE — ED Provider Notes (Signed)
Physical Exam  BP 137/74   Pulse 70   Temp 98.5 F (36.9 C) (Oral)   Resp 12   Ht 5\' 5"  (1.651 m)   Wt 72.6 kg   SpO2 100%   BMI 26.63 kg/m   Physical Exam Vitals and nursing note reviewed.  Constitutional:      General: She is not in acute distress.    Appearance: She is well-developed. She is not diaphoretic.  HENT:     Head: Normocephalic and atraumatic.  Eyes:     General: No scleral icterus.    Conjunctiva/sclera: Conjunctivae normal.  Pulmonary:     Effort: Pulmonary effort is normal. No respiratory distress.  Musculoskeletal:     Cervical back: Normal range of motion.  Skin:    Findings: No rash.  Neurological:     Mental Status: She is alert.     ED Course/Procedures   Clinical Course as of Sep 13 2321  Sun Sep 14, 2019  1947 Informed MRI that patient is here, they state she is next in line.   [HK]    Clinical Course User Index [HK] Delia Heady, PA-C    Procedures  MDM  7:24 PM Patient care resumed after transfer from [facility] ED.  Briefly, patient presented to the ER for numbness and tingling of left lower extremity since 1:30 PM today.  Patient seen by teleneurologist at [facility].  CT showed possible acute infarct.  She was transferred to [facility] for MRIs of the brain and possible admission.  Patient without any complaints on my initial evaluation. States her numbness has improved slightly.  Awaiting MRI results.  11:13 PM MRI shows no acute infarct.  However due to teleneurologist recommendations and prior provider recommendations, will need to admit patient for TIA work-up.    11:21 PM Spoke to Dr. Leonel Ramsay, neurology who recommends admission as well. He will evaluate the patient in consult.   MR ANGIO HEAD WO CONTRAST  Result Date: 09/14/2019 CLINICAL DATA:  Left-sided paresthesia. EXAM: MRI HEAD WITHOUT AND WITH CONTRAST MRA HEAD WITHOUT CONTRAST MRA NECK WITHOUT AND WITH CONTRAST TECHNIQUE: Multiplanar, multiecho pulse  sequences of the brain and surrounding structures were obtained without and with intravenous contrast. Angiographic images of the Circle of Willis were obtained using MRA technique without intravenous contrast. Angiographic images of the neck were obtained using MRA technique without and with intravenous contrast. Carotid stenosis measurements (when applicable) are obtained utilizing NASCET criteria, using the distal internal carotid diameter as the denominator. CONTRAST:  7.60mL GADAVIST GADOBUTROL 1 MMOL/ML IV SOLN COMPARISON:  Brain MRI 06/10/2015 FINDINGS: MRI HEAD FINDINGS BRAIN: The midline structures are normal. There is no acute infarct, acute hemorrhage or mass. Multifocal white matter hyperintensity, most commonly due to chronic ischemic microangiopathy. The CSF spaces are normal for age, with no hydrocephalus. Blood-sensitive sequences show no chronic microhemorrhage or superficial siderosis. SKULL AND UPPER CERVICAL SPINE: The visualized skull base, calvarium, upper cervical spine and extracranial soft tissues are normal. SINUSES/ORBITS: No fluid levels or advanced mucosal thickening. No mastoid or middle ear effusion. The orbits are normal. MRA HEAD FINDINGS POSTERIOR CIRCULATION: --Basilar artery: Normal. --Posterior cerebral arteries: Normal. Both originate from the basilar artery. --Superior cerebellar arteries: Normal. --Inferior cerebellar arteries: Normal anterior and posterior inferior cerebellar arteries. ANTERIOR CIRCULATION: --Intracranial internal carotid arteries: Normal. 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 Anterior cerebral arteries are patent. --Middle cerebral arteries: Normal. --Posterior communicating arteries: Diminutive or absent bilaterally. MRA NECK  FINDINGS Aortic arch: Normal 3 vessel aortic branching pattern. The visualized subclavian arteries are normal. Right carotid system: Normal course and caliber without stenosis  or evidence of dissection. Left carotid system: Normal course and caliber without stenosis or evidence of dissection. Vertebral arteries: Left dominant. Vertebral artery origins are normal. Vertebral arteries are normal in course and caliber to the vertebrobasilar confluence without stenosis or evidence of dissection. IMPRESSION: 1. Mild chronic small vessel disease without acute intracranial abnormality. 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 3. Otherwise normal head and neck MRA. Electronically Signed   By: Ulyses Jarred M.D.   On: 09/14/2019 23:06   MR Angiogram Neck W or Wo Contrast  Result Date: 09/14/2019 CLINICAL DATA:  Left-sided paresthesia. EXAM: MRI HEAD WITHOUT AND WITH CONTRAST MRA HEAD WITHOUT CONTRAST MRA NECK WITHOUT AND WITH CONTRAST TECHNIQUE: Multiplanar, multiecho pulse sequences of the brain and surrounding structures were obtained without and with intravenous contrast. Angiographic images of the Circle of Willis were obtained using MRA technique without intravenous contrast. Angiographic images of the neck were obtained using MRA technique without and with intravenous contrast. Carotid stenosis measurements (when applicable) are obtained utilizing NASCET criteria, using the distal internal carotid diameter as the denominator. CONTRAST:  7.33mL GADAVIST GADOBUTROL 1 MMOL/ML IV SOLN COMPARISON:  Brain MRI 06/10/2015 FINDINGS: MRI HEAD FINDINGS BRAIN: The midline structures are normal. There is no acute infarct, acute hemorrhage or mass. Multifocal white matter hyperintensity, most commonly due to chronic ischemic microangiopathy. The CSF spaces are normal for age, with no hydrocephalus. Blood-sensitive sequences show no chronic microhemorrhage or superficial siderosis. SKULL AND UPPER CERVICAL SPINE: The visualized skull base, calvarium, upper cervical spine and extracranial soft tissues are normal. SINUSES/ORBITS: No fluid levels or advanced mucosal thickening. No mastoid or middle  ear effusion. The orbits are normal. MRA HEAD FINDINGS POSTERIOR CIRCULATION: --Basilar artery: Normal. --Posterior cerebral arteries: Normal. Both originate from the basilar artery. --Superior cerebellar arteries: Normal. --Inferior cerebellar arteries: Normal anterior and posterior inferior cerebellar arteries. ANTERIOR CIRCULATION: --Intracranial internal carotid arteries: Normal. 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 Anterior cerebral arteries are patent. --Middle cerebral arteries: Normal. --Posterior communicating arteries: Diminutive or absent bilaterally. MRA NECK FINDINGS Aortic arch: Normal 3 vessel aortic branching pattern. The visualized subclavian arteries are normal. Right carotid system: Normal course and caliber without stenosis or evidence of dissection. Left carotid system: Normal course and caliber without stenosis or evidence of dissection. Vertebral arteries: Left dominant. Vertebral artery origins are normal. Vertebral arteries are normal in course and caliber to the vertebrobasilar confluence without stenosis or evidence of dissection. IMPRESSION: 1. Mild chronic small vessel disease without acute intracranial abnormality. 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 3. Otherwise normal head and neck MRA. Electronically Signed   By: Ulyses Jarred M.D.   On: 09/14/2019 23:06   MR Brain W and Wo Contrast  Result Date: 09/14/2019 CLINICAL DATA:  Left-sided paresthesia. EXAM: MRI HEAD WITHOUT AND WITH CONTRAST MRA HEAD WITHOUT CONTRAST MRA NECK WITHOUT AND WITH CONTRAST TECHNIQUE: Multiplanar, multiecho pulse sequences of the brain and surrounding structures were obtained without and with intravenous contrast. Angiographic images of the Circle of Willis were obtained using MRA technique without intravenous contrast. Angiographic images of the neck were obtained using MRA technique without and with intravenous contrast. Carotid  stenosis measurements (when applicable) are obtained utilizing NASCET criteria, using the distal internal carotid diameter as the denominator. CONTRAST:  7.52mL GADAVIST GADOBUTROL 1 MMOL/ML IV SOLN COMPARISON:  Brain  MRI 06/10/2015 FINDINGS: MRI HEAD FINDINGS BRAIN: The midline structures are normal. There is no acute infarct, acute hemorrhage or mass. Multifocal white matter hyperintensity, most commonly due to chronic ischemic microangiopathy. The CSF spaces are normal for age, with no hydrocephalus. Blood-sensitive sequences show no chronic microhemorrhage or superficial siderosis. SKULL AND UPPER CERVICAL SPINE: The visualized skull base, calvarium, upper cervical spine and extracranial soft tissues are normal. SINUSES/ORBITS: No fluid levels or advanced mucosal thickening. No mastoid or middle ear effusion. The orbits are normal. MRA HEAD FINDINGS POSTERIOR CIRCULATION: --Basilar artery: Normal. --Posterior cerebral arteries: Normal. Both originate from the basilar artery. --Superior cerebellar arteries: Normal. --Inferior cerebellar arteries: Normal anterior and posterior inferior cerebellar arteries. ANTERIOR CIRCULATION: --Intracranial internal carotid arteries: Normal. 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 Anterior cerebral arteries are patent. --Middle cerebral arteries: Normal. --Posterior communicating arteries: Diminutive or absent bilaterally. MRA NECK FINDINGS Aortic arch: Normal 3 vessel aortic branching pattern. The visualized subclavian arteries are normal. Right carotid system: Normal course and caliber without stenosis or evidence of dissection. Left carotid system: Normal course and caliber without stenosis or evidence of dissection. Vertebral arteries: Left dominant. Vertebral artery origins are normal. Vertebral arteries are normal in course and caliber to the vertebrobasilar confluence without stenosis or evidence of dissection.  IMPRESSION: 1. Mild chronic small vessel disease without acute intracranial abnormality. 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 3. Otherwise normal head and neck MRA. Electronically Signed   By: Ulyses Jarred M.D.   On: 09/14/2019 23:06   CT HEAD CODE STROKE WO CONTRAST  Result Date: 09/14/2019 CLINICAL DATA:  Code stroke. Acute neuro deficit. Left leg numbness. EXAM: CT HEAD WITHOUT CONTRAST TECHNIQUE: Contiguous axial images were obtained from the base of the skull through the vertex without intravenous contrast. COMPARISON:  MRI head 06/10/2015 FINDINGS: Brain: Hypodensity in the right anterior internal capsule compatible with infarct. This was not present on the prior MRI. This could be acute. Ventricle size normal.  Negative for acute hemorrhage or mass. Vascular: Negative for hyperdense vessel Skull: Negative Sinuses/Orbits: Paranasal sinuses clear. Bilateral cataract extraction. Other: None ASPECTS ([place] Stroke Program Early CT Score) - Ganglionic level infarction (caudate, lentiform nuclei, internal capsule, insula, M1-M3 cortex): 6 - Supraganglionic infarction (M4-M6 cortex): 3 Total score (0-10 with 10 being normal): 9 IMPRESSION: 1. Hypodensity anterior aspect of right internal capsule compatible with infarct. This was not present in 2017 and could be acute. No acute hemorrhage. 2. ASPECTS is 9 3. These results were called by telephone at the time of interpretation on 09/14/2019 at 3:17 pm to provider [facility] , who verbally acknowledged these results. Electronically Signed   By: Franchot Gallo M.D.   On: 09/14/2019 15:17    All imaging, if done today, including plain films, CT scans, and ultrasounds, independently reviewed by me, and interpretations confirmed via formal radiology reads.   Portions of this note were generated with [employer]. Dictation errors may occur despite best attempts at proofreading.      Delia Heady, PA-C 09/14/19 2323    Lajean Saver, MD 09/15/19 1245

## 2019-09-14 NOTE — ED Notes (Signed)
Attempted report several times.  Unable to reach charge at [facility].  Report given to [facility].

## 2019-09-14 NOTE — ED Provider Notes (Addendum)
[facility] EMERGENCY DEPARTMENT Provider Note   CSN: [id number] Arrival date & time: 09/14/19  1357     History Chief Complaint  Patient presents with  . Extremity Weakness  . Palpitations    Cynthia [name] is a 74 y.o. female with history of anxiety, allergies, degenerative joint disease, hyperlipidemia, osteopenia, psoriasis, peptic ulcer disease, essential tremor, migraine headaches presents for evaluation of acute onset, improving but persistent left-sided paresthesias.  She reports that symptoms began at around 1:30 PM after she had sat down after eating lunch.  Symptoms began with numbness and tingling of the left lower extremity in its entirety.  She states that within 1 minute of this beginning her her entire left side began to feel numb and tingly.  She does report feeling as though her extremities were heavy.  She states the symptoms have been constant but have improved since they began.  She checked her blood pressure when her symptoms began and noted that it was elevated for her with systolics of [id number] and one arm and 130 in the other arm.  She denies vision changes or headache.  She did feel mild chest tightness but this has also improved significantly.  She did feel lightheaded with standing.  Denies difficulty speaking or swallowing.  She is a non-smoker, denies recreational drug use or excessive alcohol intake.  She is not on any blood thinners.  The history is provided by the patient.       Past Medical History:  Diagnosis Date  . Allergy   . Anxiety   . Arthritis   . Blood transfusion without reported diagnosis   . Constipation, chronic   . DEGENERATIVE JOINT DISEASE 06/02/2010  . Depression   . Glaucoma 2014   Right eye  . Headache(784.0)   . Hyperlipidemia    hx of  . Osteopenia   . Psoriasis   . PUD (peptic ulcer disease) 2012   Upper GI bleed d/t NSAIDs  . RLS (restless legs syndrome)    robaxin prn    Patient Active Problem List   Diagnosis  Date Noted  . Insomnia 05/19/2019  . Gastroesophageal reflux disease without esophagitis 05/02/2019  . Essential tremor 07/25/2017  . Lichen [id number]  . PCP NOTES >>>>>>>>>>>>>>>>>>>>>>>>>>>>>>>>>>> 03/25/2015  . H/O adenomatous polyp of colon 05/18/2014  . Annual physical exam 03/13/2011  . Osteopenia   . Osteoarthritis 06/02/2010  . Hyperlipidemia 11/09/2009  . Migraine headache 06/28/2009  . CONSTIPATION, CHRONIC 02/01/2007  . Anxiety and depression 08/17/2006  . ALLERGIC RHINITIS 08/17/2006    Past Surgical History:  Procedure Laterality Date  . ANKLE FRACTURE SURGERY Left    surgery 12-11 (LEFT)  . CESAREAN SECTION  1969     OB History    Gravida  1   Para  1   Term      Preterm      AB      Living  1     SAB      TAB      Ectopic      Multiple      Live Births  1           Family History  Problem Relation Age of Onset  . Aneurysm Mother        brain  . Celiac disease Brother   . Diabetes Maternal Grandfather   . Hypertension Neg Hx   . Breast cancer Neg Hx   . Coronary artery disease Neg Hx   .  Colon cancer Neg Hx   . Esophageal cancer Neg Hx   . Stomach cancer Neg Hx   . Rectal cancer Neg Hx     Social History   Tobacco Use  . Smoking status: Never Smoker  . Smokeless tobacco: Never Used  Substance Use Topics  . Alcohol use: Yes    Comment: socially  . Drug use: No    Home Medications Prior to Admission medications   Medication Sig Start Date End Date Taking? Authorizing Provider  acetaminophen (TYLENOL) 325 MG tablet Take 650 mg by mouth as needed.    [provider]  azelastine (ASTELIN) 0.1 % nasal spray Place 2 sprays into both nostrils at bedtime. 09/21/14   Colon Branch, MD  B COMPLEX VITAMINS PO Take by mouth daily.    [provider]  Bepotastine Besilate (BEPREVE) 1.5 % SOLN Place 1 drop into both eyes 2 (two) times daily as needed.     [provider]  buPROPion (WELLBUTRIN XL) 300 MG 24  hr tablet Take 1 tablet (300 mg total) by mouth daily. 08/25/19   Colon Branch, MD  calcipotriene (DOVONOX) 0.005 % cream Apply topically daily.  05/27/13   [provider]  cetirizine (ZYRTEC) 10 MG tablet Take 10 mg by mouth daily.      [provider]  clobetasol (TEMOVATE) 0.05 % external solution Apply 1 application topically 2 (two) times daily as needed.  03/15/15   [provider]  guaiFENesin (MUCINEX) 600 MG 12 hr tablet Take by mouth as needed.    [provider]  methocarbamol (ROBAXIN) 500 MG tablet Take 1 tablet (500 mg total) by mouth 3 (three) times daily as needed. 09/02/19   Colon Branch, MD  nystatin (MYCOSTATIN) 100000 UNIT/ML suspension Take 5 mLs (500,000 Units total) by mouth 4 (four) times daily. 05/16/19   Sharion Balloon, NP  nystatin (MYCOSTATIN) 100000 UNIT/ML suspension Take 5 mLs (500,000 Units total) by mouth 4 (four) times daily. 09/05/19   Ann Held, DO  Omega-3 Fatty Acids (FISH OIL) 1000 MG CPDR Take by mouth.      [provider]  ondansetron (ZOFRAN) 4 MG tablet Take 1 tablet (4 mg total) by mouth every 8 (eight) hours as needed for nausea or vomiting. 01/21/18   Luetta Nutting, DO  pantoprazole (PROTONIX) 40 MG tablet Take 1 tablet (40 mg total) by mouth daily. 05/01/19   Colon Branch, MD  polyethylene glycol [facility] / Floria Raveling) packet Take 17 g by mouth daily as needed.    [provider]  sertraline (ZOLOFT) 100 MG tablet Take 1 tablet (100 mg total) by mouth daily. 07/28/19   Colon Branch, MD  SUMAtriptan (IMITREX) 100 MG tablet TAKE 1 TABLET BY MOUTH AT EARLIEST ONSET OF HEADACHE. MAY REPEAT ONCE IN 2 HOURS IF HEADACHE PERSISTS OR RECURS. 05/27/18   Pieter Partridge, DO  traMADol (ULTRAM) 50 MG tablet Take 1 tablet (50 mg total) by mouth every 6 (six) hours as needed. 05/19/19   Colon Branch, MD  Travoprost, BAK Free, (TRAVATAN) 0.004 % SOLN ophthalmic solution Place 1 drop into the right eye at bedtime.     [provider]  valACYclovir (VALTREX) 1000 MG tablet Take 1 tablet (1,000 mg total) by mouth 3 (three) times daily. 05/19/19   Colon Branch, MD  valACYclovir (VALTREX) 1000 MG tablet Take 1 tablet (1,000 mg total) by mouth 3 (three) times daily. 09/05/19   Lowne  Lyndal Pulley R, DO  verapamil (CALAN-SR) 180 MG CR tablet Take 1 tablet (180 mg total) by mouth at bedtime. 04/17/19   Pieter Partridge, DO  Vitamin D, Cholecalciferol, 25 MCG (1000 UT) TABS Take by mouth daily.    [provider]  zolpidem (AMBIEN) 5 MG tablet Take 1 tablet (5 mg total) by mouth at bedtime as needed for sleep. 07/04/19   Colon Branch, MD    Allergies    Motrin [ibuprofen], Pollen extract, Statins, Sulfonamide derivatives, and Ciprofloxacin  Review of Systems   Review of Systems  Constitutional: Negative for chills and fever.  Respiratory: Positive for chest tightness and shortness of breath.   Gastrointestinal: Positive for nausea. Negative for abdominal pain and vomiting.  Neurological: Positive for weakness, light-headedness and numbness. Negative for speech difficulty and headaches.  All other systems reviewed and are negative.   Physical Exam Updated Vital Signs BP (!) 157/81 (BP Location: Right Arm)   Pulse 74   Temp 98.1 F (36.7 C) (Oral)   Resp 16   Ht 5\' 5"  (1.651 m)   Wt 72.6 kg   SpO2 100%   BMI 26.63 kg/m   Physical Exam Vitals and nursing note reviewed.  Constitutional:      General: She is not in acute distress.    Appearance: She is well-developed.  HENT:     Head: Normocephalic and atraumatic.  Eyes:     General:        Right eye: No discharge.        Left eye: No discharge.     Conjunctiva/sclera: Conjunctivae normal.  Neck:     Vascular: No JVD.     Trachea: No tracheal deviation.  Cardiovascular:     Rate and Rhythm: Normal rate and regular rhythm.     Pulses: Normal pulses.     Heart sounds: Normal heart sounds.  Pulmonary:     Effort: Pulmonary effort is  normal.     Breath sounds: Normal breath sounds.  Chest:     Chest wall: No tenderness.  Abdominal:     General: Bowel sounds are normal. There is no distension.     Palpations: Abdomen is soft.     Tenderness: There is no abdominal tenderness. There is no guarding or rebound.  Musculoskeletal:     Cervical back: Neck supple.  Skin:    General: Skin is warm and dry.     Findings: No erythema.  Neurological:     Mental Status: She is alert.     Comments: Mental Status:  Alert, thought content appropriate, able to give a coherent history. Speech fluent without evidence of aphasia. Able to follow 2 step commands without difficulty.  Cranial Nerves:  II:  Peripheral visual fields grossly normal, pupils equal, round, reactive to light III,IV, VI: ptosis not present, extra-ocular motions intact bilaterally  V,VII: smile symmetric, decreased facial light touch sensation on the left VIII: hearing grossly normal to voice  X: uvula elevates symmetrically  XI: bilateral shoulder shrug symmetric and strong XII: midline tongue extension without fassiculations Motor:  Normal tone. 5/5 strength of BUE and BLE major muscle groups including strong and equal grip strength and dorsiflexion/plantar flexion, mild tremulousness of the left upper extremity with pronator drift. Sensory: Subjectively altered sensation to light touch of the left side of the face and left lower extremity the patient reports this is mild Cerebellar: Romberg sign present, normal finger-to-nose with bilateral upper extremities Gait: normal gait and balance. Able  to walk on toes and heels with ease.     Psychiatric:        Behavior: Behavior normal.     ED Results / Procedures / Treatments   Labs (all labs ordered are listed, but only abnormal results are displayed) Labs Reviewed  DIFFERENTIAL - Abnormal; Notable for the following components:      Result Value   Eosinophils Absolute 0.6 (*)    All other components  within normal limits  COMPREHENSIVE METABOLIC PANEL - Abnormal; Notable for the following components:   Sodium 133 (*)    All other components within normal limits  SARS CORONAVIRUS 2 (TAT 6-24 HRS)  ETHANOL  PROTIME-INR  APTT  CBC  RAPID URINE DRUG SCREEN, HOSP PERFORMED  URINALYSIS, ROUTINE W REFLEX MICROSCOPIC  TROPONIN I (HIGH SENSITIVITY)  TROPONIN I (HIGH SENSITIVITY)    EKG EKG Interpretation  Date/Time:  Sunday Sep 14 2019 14:18:00 EDT Ventricular Rate:  76 PR Interval:    QRS Duration: 92 QT Interval:  404 QTC Calculation: 455 R Axis:   29 Text Interpretation: Sinus rhythm No significant change since last tracing Confirmed by Isla Pence [phone]) on 09/14/2019 2:22:13 PM   Radiology CT HEAD CODE STROKE WO CONTRAST  Result Date: 09/14/2019 CLINICAL DATA:  Code stroke. Acute neuro deficit. Left leg numbness. EXAM: CT HEAD WITHOUT CONTRAST TECHNIQUE: Contiguous axial images were obtained from the base of the skull through the vertex without intravenous contrast. COMPARISON:  MRI head 06/10/2015 FINDINGS: Brain: Hypodensity in the right anterior internal capsule compatible with infarct. This was not present on the prior MRI. This could be acute. Ventricle size normal.  Negative for acute hemorrhage or mass. Vascular: Negative for hyperdense vessel Skull: Negative Sinuses/Orbits: Paranasal sinuses clear. Bilateral cataract extraction. Other: None ASPECTS ([facility] Stroke Program Early CT Score) - Ganglionic level infarction (caudate, lentiform nuclei, internal capsule, insula, M1-M3 cortex): 6 - Supraganglionic infarction (M4-M6 cortex): 3 Total score (0-10 with 10 being normal): 9 IMPRESSION: 1. Hypodensity anterior aspect of right internal capsule compatible with infarct. This was not present in 2017 and could be acute. No acute hemorrhage. 2. ASPECTS is 9 3. These results were called by telephone at the time of interpretation on 09/14/2019 at 3:17 pm to provider [facility] , who  verbally acknowledged these results. Electronically Signed   By: Franchot Gallo M.D.   On: 09/14/2019 15:17    Procedures .Critical Care Performed by: Renita Papa, PA-C Authorized by: Renita Papa, PA-C   Critical care provider statement:    Critical care time (minutes):  45   Critical care was necessary to treat or prevent imminent or life-threatening deterioration of the following conditions:  CNS failure or compromise   Critical care was time spent personally by me on the following activities:  Discussions with consultants, evaluation of patient's response to treatment, examination of patient, ordering and performing treatments and interventions, ordering and review of laboratory studies, ordering and review of radiographic studies, pulse oximetry, re-evaluation of patient's condition, obtaining history from patient or surrogate and review of old charts   (including critical care time)  Medications Ordered in ED Medications - No data to display  ED Course  I have reviewed the triage vital signs and the nursing notes.  Pertinent labs & imaging results that were available during my care of the patient were reviewed by me and considered in my medical decision making (see chart for details).    MDM Rules/Calculators/A&P  Patient presenting for evaluation of sudden onset left-sided paresthesias.  Symptoms began at 1:30 PM initially with left lower extremity paresthesias, then quickly progressed to the entire left side.  She is afebrile, vital signs mostly within normal limits but at one point was hypertensive.  She is resting comfortably in no apparent distress.  She does have persistently altered sensation to light touch of the left side of the body reports that it was mild and improved significantly since her symptoms began.  However since symptoms are persistent we will call a code stroke for further evaluation.  Anticipate she is not a candidate for TPA due to her  very mild symptoms.  Head CT obtained shows a hypodensity in the anterior aspect of the right internal capsule compatible with infarct which was not present on imaging in 2017 but difficult to tell if this is acute.  No acute hemorrhage noted on imaging.  Lab work reviewed and interpreted by myself shows no leukocytosis, no anemia, no metabolic derangements, no renal insufficiency.  5:05 PM CONSULT: Spoke with Dr. Nicoletta Dress with teleneurology who has assessed the patient and reviewed her imaging.  He recommends MRI/MRA of the brain and neck.  If imaging shows no evidence of acute infarct it may be reasonable to discharge her home with close outpatient follow-up although ultimately it would be of most benefit to her to be admitted for stroke/TIA work-up, echocardiogram, etc.  Spoke with patient, discussed work-up and findings and she is agreeable to transfer to [facility] ED for imaging.  She reports that her numbness is still present but improved since I last assessed her.  Spoke with Dr. Wilson Singer at [facility], ED who agrees to transfer.  Final Clinical Impression(s) / ED Diagnoses Final diagnoses:  Left sided numbness  Abnormal head CT    Rx / DC Orders ED Discharge Orders    None        Debroah Baller 09/14/19 1728    Isla Pence, MD 09/17/19 1551

## 2019-09-14 NOTE — Consult Note (Signed)
[facility] Tele[facility]rology Consult Services  Stat Consult  Date of Service:   09/14/2019 15:57:56  Impression:     .  R20.2 - Paresthesia of skin  Comments/Sign-Out: 64 F, h/o HTN, migraine, here with L sided numbness involving face, arm, and leg that has since resolved. NIHSS 0. Head CT shows a subtle hypodensity in the R internal capsule suspicious for acute stroke.   PLAN  - MRI brain w/ contrast  - MRA head/neck w/ and w/o contrast  - TTE  - check a1c and LDL  - monitor on tele for afib  - neuro to follow  --  CT HEAD: Reviewed  Metrics: [facility] Notification Time: 09/14/2019 15:54:39 Stamp Time: 09/14/2019 15:57:56 Callback Response Time: 09/14/2019 15:58:26  Disposition: Neurology Follow Up Recommended  Sign Out:     .  Discussed with Emergency Department Provider  ----------------------------------------------------------------------------------------------------  Chief Complaint: L numbness  History of Present Illness: Patient is a 74 year old Female.  16 F, h/o HL and migraine, who was LKW at 1:30 PM today when she developed new onset L hemibody numbness and tingling affecting her face, arm, and leg. It began improving and by her arrival to ER, she is essentially back to nl. NIHSS 0. No deficits noted. No stroke in the past.   Head CT 1. Hypodensity anterior aspect of right internal capsule compatible with infarct. This was not present in 2017 and could be acute. No acute hemorrhage. 2. ASPECTS is 9   Examination: 1A: Level of Consciousness - Alert; keenly responsive + 0 1B: Ask Month and Age - Both Questions Right + 0 1C: Blink Eyes & Squeeze Hands - Performs Both Tasks + 0 2: Test Horizontal Extraocular Movements - Normal + 0 3: Test Visual Fields - No Visual Loss + 0 4: Test Facial Palsy (Use Grimace if Obtunded) - Normal symmetry + 0 5A: Test Left Arm Motor Drift - No Drift for 10 Seconds + 0 5B: Test Right Arm Motor  Drift - No Drift for 10 Seconds + 0 6A: Test Left Leg Motor Drift - No Drift for 5 Seconds + 0 6B: Test Right Leg Motor Drift - No Drift for 5 Seconds + 0 7: Test Limb Ataxia (FNF/Heel-Shin) - No Ataxia + 0 8: Test Sensation - Normal; No sensory loss + 0 9: Test Language/Aphasia - Normal; No aphasia + 0 10: Test Dysarthria - Normal + 0 11: Test Extinction/Inattention - No abnormality + 0  NIHSS Score: 0    Due to the immediate potential for life-threatening deterioration due to underlying acute neurologic illness, I spent 15 minutes providing critical care. This time includes time for face to face visit via telemedicine, review of medical records, imaging studies and discussion of findings with providers, the patient and/or family.   Dr Burtis Junes   [facility] [phone]  Case [id number]

## 2019-09-14 NOTE — ED Triage Notes (Addendum)
Pt reports noticing numbness in her left leg after eating lunch around 130. Then numbness started in her left arm and she states she felt "weak and drained". Currently she is having "tingling" left side of body and states she still feels "very weak". Also reports having palpitations at that time. C/o chest tightness at present. Grips equal, no drift; facial symmetry present

## 2019-09-14 NOTE — ED Notes (Signed)
Pt transferred from [facility] for a mri  At present she has some numbnmess lt lower leg juist above her lt ankle  She has a metal plate in her lt leg

## 2019-09-15 ENCOUNTER — Other Ambulatory Visit: Payer: Self-pay | Admitting: Neurology

## 2019-09-15 ENCOUNTER — Encounter (HOSPITAL_COMMUNITY): Payer: Self-pay | Admitting: Internal Medicine

## 2019-09-15 ENCOUNTER — Observation Stay (HOSPITAL_BASED_OUTPATIENT_CLINIC_OR_DEPARTMENT_OTHER): Payer: Medicare PPO

## 2019-09-15 DIAGNOSIS — E7800 Pure hypercholesterolemia, unspecified: Secondary | ICD-10-CM

## 2019-09-15 DIAGNOSIS — G459 Transient cerebral ischemic attack, unspecified: Principal | ICD-10-CM

## 2019-09-15 DIAGNOSIS — G43109 Migraine with aura, not intractable, without status migrainosus: Secondary | ICD-10-CM

## 2019-09-15 LAB — CBC
HCT: 36.7 % (ref 36.0–46.0)
Hemoglobin: 12.2 g/dL (ref 12.0–15.0)
MCH: 31 pg (ref 26.0–34.0)
MCHC: 33.2 g/dL (ref 30.0–36.0)
MCV: 93.4 fL (ref 80.0–100.0)
Platelets: 331 10*3/uL (ref 150–400)
RBC: 3.93 MIL/uL (ref 3.87–5.11)
RDW: 13.2 % (ref 11.5–15.5)
WBC: 5.7 10*3/uL (ref 4.0–10.5)
nRBC: 0 % (ref 0.0–0.2)

## 2019-09-15 LAB — LIPID PANEL
Cholesterol: 259 mg/dL — ABNORMAL HIGH (ref 0–200)
HDL: 67 mg/dL (ref >40)
LDL Cholesterol: 162 mg/dL — ABNORMAL HIGH (ref 0–99)
Total CHOL/HDL Ratio: 3.9 RATIO
Triglycerides: 148 mg/dL (ref <150)
VLDL: 30 mg/dL (ref 0–40)

## 2019-09-15 LAB — ECHOCARDIOGRAM COMPLETE
Height: 65 in
Weight: 2560 oz

## 2019-09-15 LAB — SARS CORONAVIRUS 2 (TAT 6-24 HRS): SARS Coronavirus 2: NEGATIVE

## 2019-09-15 LAB — HEMOGLOBIN A1C
Hgb A1c MFr Bld: 5.2 % (ref 4.8–5.6)
Mean Plasma Glucose: 102.54 mg/dL

## 2019-09-15 LAB — CREATININE, SERUM
Creatinine, Ser: 0.91 mg/dL (ref 0.44–1.00)
GFR calc Af Amer: >60 mL/min (ref >60)
GFR calc non Af Amer: >60 mL/min (ref >60)

## 2019-09-15 MED ORDER — STROKE: EARLY STAGES OF RECOVERY BOOK: 1.0000 | Freq: Once | 0 refills | Status: AC

## 2019-09-15 MED ORDER — ASPIRIN 81 MG PO TBEC: 81.0000 mg | DELAYED_RELEASE_TABLET | Freq: Every day | ORAL | 0 refills | Status: AC

## 2019-09-15 MED ORDER — EZETIMIBE 10 MG PO TABS: 10.0000 mg | ORAL_TABLET | Freq: Every day | ORAL | 0 refills | Status: DC

## 2019-09-15 MED ORDER — VERAPAMIL HCL ER 180 MG PO TBCR
180.0000 mg | EXTENDED_RELEASE_TABLET | Freq: Every day | ORAL | Status: DC
  Filled 2019-09-15 (×2): qty 1

## 2019-09-15 MED ORDER — ACETAMINOPHEN 325 MG PO TABS
650.0000 mg | ORAL_TABLET | ORAL | Status: DC | PRN
  Administered 2019-09-15: 650 mg via ORAL
  Filled 2019-09-15: qty 2

## 2019-09-15 MED ORDER — ACETAMINOPHEN 160 MG/5ML PO SOLN: 650.0000 mg | ORAL | Status: DC | PRN

## 2019-09-15 MED ORDER — SODIUM CHLORIDE 0.9 % IV SOLN: INTRAVENOUS | Status: DC

## 2019-09-15 MED ORDER — ASPIRIN 325 MG PO TABS: 325.0000 mg | ORAL_TABLET | Freq: Every day | ORAL | Status: DC

## 2019-09-15 MED ORDER — CLOPIDOGREL BISULFATE 75 MG PO TABS: 300.0000 mg | ORAL_TABLET | Freq: Once | ORAL | Status: DC

## 2019-09-15 MED ORDER — ENOXAPARIN SODIUM 40 MG/0.4ML ~~LOC~~ SOLN
40.0000 mg | SUBCUTANEOUS | Status: DC
  Administered 2019-09-15: 40 mg via SUBCUTANEOUS
  Filled 2019-09-15: qty 0.4

## 2019-09-15 MED ORDER — ASPIRIN 300 MG RE SUPP: 300.0000 mg | Freq: Every day | RECTAL | Status: DC

## 2019-09-15 MED ORDER — EZETIMIBE 10 MG PO TABS
10.0000 mg | ORAL_TABLET | Freq: Every day | ORAL | Status: DC
  Administered 2019-09-15: 10 mg via ORAL
  Filled 2019-09-15: qty 1

## 2019-09-15 MED ORDER — BUPROPION HCL ER (XL) 150 MG PO TB24
300.0000 mg | ORAL_TABLET | Freq: Every day | ORAL | Status: DC
  Administered 2019-09-15: 300 mg via ORAL
  Filled 2019-09-15: qty 2

## 2019-09-15 MED ORDER — SERTRALINE HCL 100 MG PO TABS
100.0000 mg | ORAL_TABLET | Freq: Every day | ORAL | Status: DC
  Filled 2019-09-15: qty 1

## 2019-09-15 MED ORDER — STROKE: EARLY STAGES OF RECOVERY BOOK
Freq: Once | Status: DC
  Filled 2019-09-15: qty 1

## 2019-09-15 MED ORDER — VALACYCLOVIR HCL 500 MG PO TABS
1000.0000 mg | ORAL_TABLET | Freq: Three times a day (TID) | ORAL | Status: DC
  Administered 2019-09-15 (×2): 1000 mg via ORAL
  Filled 2019-09-15 (×3): qty 2

## 2019-09-15 MED ORDER — CLOPIDOGREL BISULFATE 75 MG PO TABS: 75.0000 mg | ORAL_TABLET | Freq: Every day | ORAL | Status: DC

## 2019-09-15 MED ORDER — ACETAMINOPHEN 650 MG RE SUPP: 650.0000 mg | RECTAL | Status: DC | PRN

## 2019-09-15 MED ORDER — ZOLPIDEM TARTRATE 5 MG PO TABS: 5.0000 mg | ORAL_TABLET | Freq: Every evening | ORAL | Status: DC | PRN

## 2019-09-15 MED ORDER — CLOPIDOGREL BISULFATE 75 MG PO TABS: 75.0000 mg | ORAL_TABLET | Freq: Every day | ORAL | 0 refills | Status: AC

## 2019-09-15 MED ORDER — LATANOPROST 0.005 % OP SOLN
1.0000 [drp] | Freq: Every day | OPHTHALMIC | Status: DC
  Filled 2019-09-15: qty 2.5

## 2019-09-15 MED ORDER — TRAMADOL HCL 50 MG PO TABS
50.0000 mg | ORAL_TABLET | Freq: Four times a day (QID) | ORAL | Status: DC | PRN
  Administered 2019-09-15: 50 mg via ORAL
  Filled 2019-09-15: qty 1

## 2019-09-15 MED ORDER — METHOCARBAMOL 500 MG PO TABS: 500.0000 mg | ORAL_TABLET | Freq: Three times a day (TID) | ORAL | Status: DC | PRN

## 2019-09-15 MED ORDER — ASPIRIN EC 81 MG PO TBEC
81.0000 mg | DELAYED_RELEASE_TABLET | Freq: Every day | ORAL | Status: DC
  Administered 2019-09-15: 81 mg via ORAL
  Filled 2019-09-15: qty 1

## 2019-09-15 NOTE — Progress Notes (Signed)
Cynthia [name] is a 74 y.o. female with history of migraine on prophylactic verapamil depression started experiencing left-sided numbness around 1:30 PM yesterday after lunch.  The numbness started from the leg going upwards to her arms and face.  All of them was on the left side.  Did not have any weakness of the extremities or any difficulty speaking swallowing or any visual symptoms.  Patient does not recall how long it lasted for by the time patient reached the ER it has resolved.  Patient had some mild chest tightness.  Which is resolved.  09/15/19: Seen and examined in the ED.  Symptoms have resolved.  Ongoing work-up for TIA.  Please refer to H&P dictated by my partner Dr. Hal Hope on 09/15/2019 for further details of the assessment and plan.

## 2019-09-15 NOTE — ED Notes (Signed)
C/o a sl headache

## 2019-09-15 NOTE — ED Notes (Signed)
Breakfast ordered 

## 2019-09-15 NOTE — Progress Notes (Signed)
STROKE TEAM PROGRESS NOTE   INTERVAL HISTORY No family is at the bedside.  Patient lying in bed, awake alert, left-sided numbness resolved.  However, complains of severe headache, worse than her baseline migraine.  She stated that she follow with Dr. Metta Clines and was given tramadol as needed for headache and it was effective.  Will resume her tramadol as needed for headache management.  She denies any severe headache at the time of yesterday event.  She denies any similar episode in the past.  Vitals:   09/15/19 0330 09/15/19 0345 09/15/19 0600 09/15/19 1130  BP: (!) 107/55 (!) 87/41 (!) 110/57 (!) 111/6  Pulse: 68 69 65 73  Resp: 10 10 14 16   Temp:    97.8 F (36.6 C)  TempSrc:    Oral  SpO2: 97% 95% 95% 100%  Weight:      Height:        CBC:  Recent Labs  Lab 09/14/19 1530 09/15/19 0512  WBC 7.6 5.7  NEUTROABS 4.0  --   HGB 12.7 12.2  HCT 37.5 36.7  MCV 93.1 93.4  PLT 343 [id number]    Basic Metabolic Panel:  Recent Labs  Lab 09/14/19 1530 09/15/19 0512  NA 133*  --   K 4.1  --   CL 100  --   CO2 24  --   GLUCOSE 86  --   BUN 11  --   CREATININE 0.75 0.91  CALCIUM 9.3  --    Lipid Panel:     Component Value Date/Time   CHOL 259 (H) 09/15/2019 0512   TRIG 148 09/15/2019 0512   TRIG 57 04/20/2006 0904   HDL 67 09/15/2019 0512   CHOLHDL 3.9 09/15/2019 0512   VLDL 30 09/15/2019 0512   LDLCALC 162 (H) 09/15/2019 0512   HgbA1c:  Lab Results  Component Value Date   HGBA1C 5.2 09/15/2019   Urine Drug Screen: No results found for: LABOPIA, COCAINSCRNUR, LABBENZ, AMPHETMU, THCU, LABBARB  Alcohol Level     Component Value Date/Time   ETH <10 09/14/2019 1531    IMAGING past 24 hours MR ANGIO HEAD WO CONTRAST  Result Date: 09/14/2019 CLINICAL DATA:  Left-sided paresthesia. EXAM: MRI HEAD WITHOUT AND WITH CONTRAST MRA HEAD WITHOUT CONTRAST MRA NECK WITHOUT AND WITH CONTRAST TECHNIQUE: Multiplanar, multiecho pulse sequences of the brain and surrounding structures  were obtained without and with intravenous contrast. Angiographic images of the Circle of Willis were obtained using MRA technique without intravenous contrast. Angiographic images of the neck were obtained using MRA technique without and with intravenous contrast. Carotid stenosis measurements (when applicable) are obtained utilizing NASCET criteria, using the distal internal carotid diameter as the denominator. CONTRAST:  7.76mL GADAVIST GADOBUTROL 1 MMOL/ML IV SOLN COMPARISON:  Brain MRI 06/10/2015 FINDINGS: MRI HEAD FINDINGS BRAIN: The midline structures are normal. There is no acute infarct, acute hemorrhage or mass. Multifocal white matter hyperintensity, most commonly due to chronic ischemic microangiopathy. The CSF spaces are normal for age, with no hydrocephalus. Blood-sensitive sequences show no chronic microhemorrhage or superficial siderosis. SKULL AND UPPER CERVICAL SPINE: The visualized skull base, calvarium, upper cervical spine and extracranial soft tissues are normal. SINUSES/ORBITS: No fluid levels or advanced mucosal thickening. No mastoid or middle ear effusion. The orbits are normal. MRA HEAD FINDINGS POSTERIOR CIRCULATION: --Basilar artery: Normal. --Posterior cerebral arteries: Normal. Both originate from the basilar artery. --Superior cerebellar arteries: Normal. --Inferior cerebellar arteries: Normal anterior and posterior inferior cerebellar arteries. ANTERIOR CIRCULATION: --Intracranial internal carotid arteries:  Normal. 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 Anterior cerebral arteries are patent. --Middle cerebral arteries: Normal. --Posterior communicating arteries: Diminutive or absent bilaterally. MRA NECK FINDINGS Aortic arch: Normal 3 vessel aortic branching pattern. The visualized subclavian arteries are normal. Right carotid system: Normal course and caliber without stenosis or evidence of dissection. Left carotid system:  Normal course and caliber without stenosis or evidence of dissection. Vertebral arteries: Left dominant. Vertebral artery origins are normal. Vertebral arteries are normal in course and caliber to the vertebrobasilar confluence without stenosis or evidence of dissection. IMPRESSION: 1. Mild chronic small vessel disease without acute intracranial abnormality. 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 3. Otherwise normal head and neck MRA. Electronically Signed   By: Ulyses Jarred M.D.   On: 09/14/2019 23:06   MR Angiogram Neck W or Wo Contrast  Result Date: 09/14/2019 CLINICAL DATA:  Left-sided paresthesia. EXAM: MRI HEAD WITHOUT AND WITH CONTRAST MRA HEAD WITHOUT CONTRAST MRA NECK WITHOUT AND WITH CONTRAST TECHNIQUE: Multiplanar, multiecho pulse sequences of the brain and surrounding structures were obtained without and with intravenous contrast. Angiographic images of the Circle of Willis were obtained using MRA technique without intravenous contrast. Angiographic images of the neck were obtained using MRA technique without and with intravenous contrast. Carotid stenosis measurements (when applicable) are obtained utilizing NASCET criteria, using the distal internal carotid diameter as the denominator. CONTRAST:  7.75mL GADAVIST GADOBUTROL 1 MMOL/ML IV SOLN COMPARISON:  Brain MRI 06/10/2015 FINDINGS: MRI HEAD FINDINGS BRAIN: The midline structures are normal. There is no acute infarct, acute hemorrhage or mass. Multifocal white matter hyperintensity, most commonly due to chronic ischemic microangiopathy. The CSF spaces are normal for age, with no hydrocephalus. Blood-sensitive sequences show no chronic microhemorrhage or superficial siderosis. SKULL AND UPPER CERVICAL SPINE: The visualized skull base, calvarium, upper cervical spine and extracranial soft tissues are normal. SINUSES/ORBITS: No fluid levels or advanced mucosal thickening. No mastoid or middle ear effusion. The orbits are normal. MRA HEAD  FINDINGS POSTERIOR CIRCULATION: --Basilar artery: Normal. --Posterior cerebral arteries: Normal. Both originate from the basilar artery. --Superior cerebellar arteries: Normal. --Inferior cerebellar arteries: Normal anterior and posterior inferior cerebellar arteries. ANTERIOR CIRCULATION: --Intracranial internal carotid arteries: Normal. 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 Anterior cerebral arteries are patent. --Middle cerebral arteries: Normal. --Posterior communicating arteries: Diminutive or absent bilaterally. MRA NECK FINDINGS Aortic arch: Normal 3 vessel aortic branching pattern. The visualized subclavian arteries are normal. Right carotid system: Normal course and caliber without stenosis or evidence of dissection. Left carotid system: Normal course and caliber without stenosis or evidence of dissection. Vertebral arteries: Left dominant. Vertebral artery origins are normal. Vertebral arteries are normal in course and caliber to the vertebrobasilar confluence without stenosis or evidence of dissection. IMPRESSION: 1. Mild chronic small vessel disease without acute intracranial abnormality. 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 3. Otherwise normal head and neck MRA. Electronically Signed   By: Ulyses Jarred M.D.   On: 09/14/2019 23:06   MR Brain W and Wo Contrast  Result Date: 09/14/2019 CLINICAL DATA:  Left-sided paresthesia. EXAM: MRI HEAD WITHOUT AND WITH CONTRAST MRA HEAD WITHOUT CONTRAST MRA NECK WITHOUT AND WITH CONTRAST TECHNIQUE: Multiplanar, multiecho pulse sequences of the brain and surrounding structures were obtained without and with intravenous contrast. Angiographic images of the Circle of Willis were obtained using MRA technique without intravenous contrast. Angiographic images of the neck were obtained using MRA technique without and with intravenous contrast. Carotid stenosis  measurements (when applicable) are obtained  utilizing NASCET criteria, using the distal internal carotid diameter as the denominator. CONTRAST:  7.33mL GADAVIST GADOBUTROL 1 MMOL/ML IV SOLN COMPARISON:  Brain MRI 06/10/2015 FINDINGS: MRI HEAD FINDINGS BRAIN: The midline structures are normal. There is no acute infarct, acute hemorrhage or mass. Multifocal white matter hyperintensity, most commonly due to chronic ischemic microangiopathy. The CSF spaces are normal for age, with no hydrocephalus. Blood-sensitive sequences show no chronic microhemorrhage or superficial siderosis. SKULL AND UPPER CERVICAL SPINE: The visualized skull base, calvarium, upper cervical spine and extracranial soft tissues are normal. SINUSES/ORBITS: No fluid levels or advanced mucosal thickening. No mastoid or middle ear effusion. The orbits are normal. MRA HEAD FINDINGS POSTERIOR CIRCULATION: --Basilar artery: Normal. --Posterior cerebral arteries: Normal. Both originate from the basilar artery. --Superior cerebellar arteries: Normal. --Inferior cerebellar arteries: Normal anterior and posterior inferior cerebellar arteries. ANTERIOR CIRCULATION: --Intracranial internal carotid arteries: Normal. 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 Anterior cerebral arteries are patent. --Middle cerebral arteries: Normal. --Posterior communicating arteries: Diminutive or absent bilaterally. MRA NECK FINDINGS Aortic arch: Normal 3 vessel aortic branching pattern. The visualized subclavian arteries are normal. Right carotid system: Normal course and caliber without stenosis or evidence of dissection. Left carotid system: Normal course and caliber without stenosis or evidence of dissection. Vertebral arteries: Left dominant. Vertebral artery origins are normal. Vertebral arteries are normal in course and caliber to the vertebrobasilar confluence without stenosis or evidence of dissection. IMPRESSION: 1. Mild chronic small vessel disease without  acute intracranial abnormality. 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 3. Otherwise normal head and neck MRA. Electronically Signed   By: Ulyses Jarred M.D.   On: 09/14/2019 23:06   ECHOCARDIOGRAM COMPLETE  Result Date: 09/15/2019    ECHOCARDIOGRAM REPORT   Patient Name:   Cynthia [name] Date of Exam: 09/15/2019 Medical Rec #:  [id number]       Height:       65.0 in Accession #:    [id number]      Weight:       160.0 lb Date of Birth:  09-23-1945        BSA:          1.799 m Patient Age:    74 years        BP:           110/57 mmHg Patient Gender: F               HR:           70 bpm. Exam Location:  Inpatient Procedure: 2D Echo Indications:    TIA 435.9  History:        Patient has no prior history of Echocardiogram examinations.  Sonographer:    Johny Chess Referring Phys: [place]  1. Left ventricular ejection fraction, by estimation, is 60 to 65%. The left ventricle has normal function. The left ventricle has no regional wall motion abnormalities. Left ventricular diastolic parameters are consistent with Grade I diastolic dysfunction (impaired relaxation).  2. Right ventricular systolic function is normal. The right ventricular size is normal. There is normal pulmonary artery systolic pressure.  3. The mitral valve is normal in structure. Trivial mitral valve regurgitation. No evidence of mitral stenosis.  4. The aortic valve is normal in structure. Aortic valve regurgitation is not visualized. No aortic stenosis is present.  5. The inferior vena cava is normal in size with greater than 50% respiratory variability,  suggesting right atrial pressure of 3 mmHg. Conclusion(s)/Recommendation(s): No intracardiac source of embolism detected on this transthoracic study. A transesophageal echocardiogram is recommended to exclude cardiac source of embolism if clinically indicated. FINDINGS  Left Ventricle: Left ventricular ejection fraction, by estimation, is 60 to 65%. The left  ventricle has normal function. The left ventricle has no regional wall motion abnormalities. The left ventricular internal cavity size was normal in size. There is  no left ventricular hypertrophy. Left ventricular diastolic parameters are consistent with Grade I diastolic dysfunction (impaired relaxation). Right Ventricle: The right ventricular size is normal. No increase in right ventricular wall thickness. Right ventricular systolic function is normal. There is normal pulmonary artery systolic pressure. The tricuspid regurgitant velocity is 2.43 m/s, and  with an assumed right atrial pressure of 3 mmHg, the estimated right ventricular systolic pressure is 123XX123 mmHg. Left Atrium: Left atrial size was normal in size. Right Atrium: Right atrial size was normal in size. Pericardium: There is no evidence of pericardial effusion. Mitral Valve: The mitral valve is normal in structure. Normal mobility of the mitral valve leaflets. Trivial mitral valve regurgitation. No evidence of mitral valve stenosis. Tricuspid Valve: The tricuspid valve is normal in structure. Tricuspid valve regurgitation is mild . No evidence of tricuspid stenosis. Aortic Valve: The aortic valve is normal in structure. Aortic valve regurgitation is not visualized. No aortic stenosis is present. Pulmonic Valve: The pulmonic valve was normal in structure. Pulmonic valve regurgitation is not visualized. No evidence of pulmonic stenosis. Aorta: The aortic root is normal in size and structure. Venous: The inferior vena cava is normal in size with greater than 50% respiratory variability, suggesting right atrial pressure of 3 mmHg. IAS/Shunts: No atrial level shunt detected by color flow Doppler.  LEFT VENTRICLE PLAX 2D LVIDd:         4.00 cm  Diastology LVIDs:         2.40 cm  LV e' lateral:   10.30 cm/s LV PW:         0.90 cm  LV E/e' lateral: 7.6 LV IVS:        0.90 cm  LV e' medial:    6.64 cm/s LVOT diam:     1.80 cm  LV E/e' medial:  11.7 LV SV:          62 LV SV Index:   35 LVOT Area:     2.54 cm  RIGHT VENTRICLE             IVC RV S prime:     11.50 cm/s  IVC diam: 1.50 cm TAPSE (M-mode): 2.1 cm LEFT ATRIUM             Index       RIGHT ATRIUM           Index LA diam:        3.80 cm 2.11 cm/m  RA Area:     10.90 cm LA Vol (A2C):   35.6 ml 19.79 ml/m RA Volume:   24.20 ml  13.45 ml/m LA Vol (A4C):   33.2 ml 18.45 ml/m LA Biplane Vol: 34.7 ml 19.29 ml/m  AORTIC VALVE LVOT Vmax:   97.90 cm/s LVOT Vmean:  65.900 cm/s LVOT VTI:    0.244 m  AORTA Ao Root diam: 2.60 cm MITRAL VALVE               TRICUSPID VALVE MV Area (PHT): 4.15 cm    TR Peak grad:   23.6 mmHg  MV Decel Time: 183 msec    TR Vmax:        243.00 cm/s MV E velocity: 78.00 cm/s MV A velocity: 83.60 cm/s  SHUNTS MV E/A ratio:  0.93        Systemic VTI:  0.24 m                            Systemic Diam: 1.80 cm Candee Furbish MD Electronically signed by Candee Furbish MD Signature Date/Time: 09/15/2019/2:05:44 PM    Final    CT HEAD CODE STROKE WO CONTRAST  Result Date: 09/14/2019 CLINICAL DATA:  Code stroke. Acute neuro deficit. Left leg numbness. EXAM: CT HEAD WITHOUT CONTRAST TECHNIQUE: Contiguous axial images were obtained from the base of the skull through the vertex without intravenous contrast. COMPARISON:  MRI head 06/10/2015 FINDINGS: Brain: Hypodensity in the right anterior internal capsule compatible with infarct. This was not present on the prior MRI. This could be acute. Ventricle size normal.  Negative for acute hemorrhage or mass. Vascular: Negative for hyperdense vessel Skull: Negative Sinuses/Orbits: Paranasal sinuses clear. Bilateral cataract extraction. Other: None ASPECTS ([place] Stroke Program Early CT Score) - Ganglionic level infarction (caudate, lentiform nuclei, internal capsule, insula, M1-M3 cortex): 6 - Supraganglionic infarction (M4-M6 cortex): 3 Total score (0-10 with 10 being normal): 9 IMPRESSION: 1. Hypodensity anterior aspect of right internal capsule compatible with  infarct. This was not present in 2017 and could be acute. No acute hemorrhage. 2. ASPECTS is 9 3. These results were called by telephone at the time of interpretation on 09/14/2019 at 3:17 pm to provider [facility] , who verbally acknowledged these results. Electronically Signed   By: Franchot Gallo M.D.   On: 09/14/2019 15:17    PHYSICAL EXAM  Temp:  [97.7 F (36.5 C)-98.5 F (36.9 C)] 97.7 F (36.5 C) (05/03 1330) Pulse Rate:  [65-77] 72 (05/03 1330) Resp:  [10-22] 16 (05/03 1130) BP: (87-153)/(41-103) 153/71 (05/03 1330) SpO2:  [95 %-100 %] 100 % (05/03 1330)  General - Well nourished, well developed, in acute distress due to headache.  Ophthalmologic - fundi not visualized due to noncooperation.  Cardiovascular - Regular rhythm and rate.  Mental Status -  Level of arousal and orientation to time, place, and person were intact. Language including expression, naming, repetition, comprehension was assessed and found intact. Fund of Knowledge was assessed and was intact.  Cranial Nerves II - XII - II - Visual field intact OU. III, IV, VI - Extraocular movements intact. V - Facial sensation intact bilaterally. VII - Facial movement intact bilaterally. VIII - Hearing & vestibular intact bilaterally. X - Palate elevates symmetrically. XI - Chin turning & shoulder shrug intact bilaterally. XII - Tongue protrusion intact.  Motor Strength - The patient's strength was normal in all extremities and pronator drift was absent.  Bulk was normal and fasciculations were absent.   Motor Tone - Muscle tone was assessed at the neck and appendages and was normal.  Reflexes - The patient's reflexes were symmetrical in all extremities and she had no pathological reflexes.  Sensory - Light touch, temperature/pinprick were assessed and were symmetrical.    Coordination - The patient had normal movements in the hands and feet with no ataxia or dysmetria.  Tremor was absent.  Gait and Station -  deferred.   ASSESSMENT/PLAN Cynthia [name] is a 74 y.o. female with history of migraine headache, HLD and anxiety presenting with transient L hemibody  numbness.  Today patient had severe headache.  TIA versus complicated migraine  Code Stroke CT head hypodensity R internal capsule. No hemorrhage. ASPECTS 9.    MRI no acute infarct  MRA head and neck  Mild small vessel disease. 20mm ACom aneurysm   2D Echo EF 60-65%. No source of embolus   LDL 162  HgbA1c 5.2  Lovenox 40 mg sq daily for VTE prophylaxis  No antithrombotic prior to admission, now on aspirin 81 mg daily and clopidogrel 75 mg daily. Continue DAPT x 3 weeks then aspirin alone.    Therapy recommendations:  pending   Disposition:  pending   History of migraine  Infrequent of severe headache  Frequent nonsevere headache, could happen 5 days in a row or no headache for 5 6 days.  Follow with Dr. Tomi Likens at [facility] neurology  On verapamil for migraine prophylaxis  Also on tramadol as needed for acute migraine treatment  Continue follow-up with Dr. Tomi Likens  Hypertension  Stable . BP goal normotensive  Hyperlipidemia  Home meds:  No statin  Intolerant to statins  LDL 162, goal < 70  Put on Zetia, continue on discharge  Other Stroke Risk Factors  Advanced age  ETOH use, alcohol level <10, advised to drink no more than 1 drink(s) a day  Other Active Problems  Anxiety and depression on bupropion and Zoloft  Small intracranial ACom aneurysm. No intervention. Follow.    Hospital day # 0  Neurology will sign off. Please call with questions. Pt will follow up with Dr. Tomi Likens at [facility] in about 4 weeks. Thanks for the consult.  Rosalin Hawking, MD PhD Stroke Neurology 09/15/2019 6:34 PM  To contact Stroke Continuity provider, please refer to [email or web address] After hours, contact General Neurology

## 2019-09-15 NOTE — Consult Note (Signed)
Neurology Consultation Reason for Consult: TIA Referring Physician: Rodell Perna  CC: Left-sided numbness  History is obtained from: Patient  HPI: Cynthia [name] is a 74 y.o. female who was in her normal state of health earlier when she began having numbness of the left hemibody.  She states that it involved both her left arm, leg and face.  This lasted for 30 to 45 minutes and then slowly resolved.  There was some tingling associated, but it was primarily "as if your leg is falling asleep."  She denies any difficulty with movement, denies any weakness or difficulty walking.  Since that time, she has been asymptomatic but was very concerned and come to the emergency department for further evaluation.   LKW: 1:30 PM tpa given?: no, resolution of symptoms    ROS: A 14 point ROS was performed and is negative except as noted in the HPI.   Past Medical History:  Diagnosis Date  . Allergy   . Anxiety   . Arthritis   . Blood transfusion without reported diagnosis   . Constipation, chronic   . DEGENERATIVE JOINT DISEASE 06/02/2010  . Depression   . Glaucoma 2014   Right eye  . Headache(784.0)   . Hyperlipidemia    hx of  . Osteopenia   . Psoriasis   . PUD (peptic ulcer disease) 2012   Upper GI bleed d/t NSAIDs  . RLS (restless legs syndrome)    robaxin prn     Family History  Problem Relation Age of Onset  . Aneurysm Mother        brain  . Celiac disease Brother   . Diabetes Maternal Grandfather   . Hypertension Neg Hx   . Breast cancer Neg Hx   . Coronary artery disease Neg Hx   . Colon cancer Neg Hx   . Esophageal cancer Neg Hx   . Stomach cancer Neg Hx   . Rectal cancer Neg Hx      Social History:  reports that she has never smoked. She has never used smokeless tobacco. She reports current alcohol use. She reports that she does not use drugs.   Exam: Current vital signs: BP 122/73   Pulse 70   Temp 97.8 F (36.6 C)   Resp 18   Ht 5\' 5"  (1.651 m)   Wt 72.6  kg   SpO2 98%   BMI 26.63 kg/m  Vital signs in last 24 hours: Temp:  [97.2 F (36.2 C)-98.5 F (36.9 C)] 97.8 F (36.6 C) (05/03 0052) Pulse Rate:  [66-77] 70 (05/03 0100) Resp:  [11-18] 18 (05/03 0100) BP: (122-157)/(63-103) 122/73 (05/03 0100) SpO2:  [96 %-100 %] 98 % (05/03 0100) Weight:  [72.6 kg] 72.6 kg (05/02 1414)   Physical Exam  Constitutional: Appears well-developed and well-nourished.  Psych: Affect appropriate to situation Eyes: No scleral injection HENT: No OP obstrucion MSK: no joint deformities.  Cardiovascular: Normal rate and regular rhythm.  Respiratory: Effort normal, non-labored breathing GI: Soft.  No distension. There is no tenderness.  Skin: WDI  Neuro: Mental Status: Patient is awake, alert, oriented to person, place, month, year, and situation. Patient is able to give a clear and coherent history. No signs of aphasia or neglect Cranial Nerves: II: Visual Fields are full. Pupils are equal, round, and reactive to light.   III,IV, VI: EOMI without ptosis or diploplia.  V: Facial sensation is symmetric to temperature VII: Facial movement is symmetric.  VIII: hearing is intact to voice X: Uvula  elevates symmetrically XI: Shoulder shrug is symmetric. XII: tongue is midline without atrophy or fasciculations.  Motor: Tone is normal. Bulk is normal. 5/5 strength was present in all four extremities.  Sensory: Sensation is symmetric to light touch and temperature in the arms and legs. Cerebellar: FNF and HKS are intact bilaterally    I have reviewed labs in epic and the results pertinent to this consultation are: CMP-unremarkable  I have reviewed the images obtained: MRI/MRA-negative other than small 2 mm a comm aneurysm.  Impression: 74 year old female with transient left-sided numbness most consistent with transient ischemic attack.  She is being admitted for secondary risk factor modification.  She does not currently take any antiplatelet  medication, and has had difficulty with NSAIDs in the past(ulcer).  I do think that dual antiplatelet therapy for 3 weeks would be prudent, followed by monotherapy with Plavix.  Recommendations: - HgbA1c, fasting lipid panel - MRI, MRA  of the brain without contrast - Frequent neuro checks - Echocardiogram - Carotid dopplers - Prophylactic therapy-Antiplatelet med: Aspirin - dose 81 mg PO, and 75 mg of Plavix p.o. following 300 mg load. - Risk factor modification - Telemetry monitoring - PT consult, OT consult, Speech consult - Stroke team to follow    Roland Rack, MD [facility] [phone]  If 7pm- 7am, please page neurology on call as listed in [place].

## 2019-09-15 NOTE — ED Notes (Signed)
Dr Leonel Ramsay has seen this pt  She had a mild headache earlier but none now.   shepassed the swallow screen at [facility]  Getting a coke at present  Her request

## 2019-09-15 NOTE — Evaluation (Addendum)
Physical Therapy Evaluation and Discharge Patient Details Name: Cynthia [name] MRN: [id number] DOB: [birth year] Today's Date: 09/15/2019   History of Present Illness  Pt is a 74 y/o female admitted secondary to L extremity numbness. Reports it has improved since admission. 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 PMH glaucoma and arthritis.   Clinical Impression  Patient evaluated by Physical Therapy with no further acute PT needs identified. All education has been completed and the patient has no further questions. Pt overall steady with dynamic gait tasks and stair navigation. Required supervision throughout. Scored 20 on DGI indicating low fall risk. Reports friends check on her regularly. Educated about "BE FAST" acronym in recognizing CVA symptoms. See below for any follow-up Physical Therapy or equipment needs. PT is signing off. Thank you for this referral. If needs change, please re-consult.      Follow Up Recommendations No PT follow up    Equipment Recommendations  None recommended by PT    Recommendations for Other Services       Precautions / Restrictions Precautions Precautions: None Restrictions Weight Bearing Restrictions: No      Mobility  Bed Mobility Overal bed mobility: Independent                Transfers Overall transfer level: Independent                  Ambulation/Gait Ambulation/Gait assistance: Supervision Gait Distance (Feet): 200 Feet Assistive device: None Gait Pattern/deviations: Step-through pattern;Decreased stride length Gait velocity: WFL    General Gait Details: Good gait speed. Able to perform dynamic gait tasks with overall supervision safety. Pt reports mild symptoms when performing vertical head turns, however, no overt LOB noted.   Stairs Stairs: Yes Stairs assistance: Supervision Stair Management: Two rails;Alternating pattern;Forwards Number of Stairs:  2 General stair comments: Overall steady stair navigation. No LOB noted.   Wheelchair Mobility    Modified Rankin (Stroke Patients Only)       Balance Overall balance assessment: Mild deficits observed, not formally tested                               Standardized Balance Assessment Standardized Balance Assessment : Dynamic Gait Index   Dynamic Gait Index Level Surface: Normal Change in Gait Speed: Mild Impairment Gait with Horizontal Head Turns: Normal Gait with Vertical Head Turns: Mild Impairment Gait and Pivot Turn: Mild Impairment Step Over Obstacle: Normal Step Around Obstacles: Normal Steps: Mild Impairment Total Score: 20       Pertinent Vitals/Pain Pain Assessment: Faces Faces Pain Scale: Hurts even more Pain Location: headache Pain Descriptors / Indicators: Headache Pain Intervention(s): Limited activity within patient's tolerance;Monitored during session;Repositioned    Home Living Family/patient expects to be discharged to:: Private residence Living Arrangements: Alone Available Help at Discharge: Friend(s);Available PRN/intermittently Type of Home: House Home Access: Stairs to enter Entrance Stairs-Rails: Right;Left;Can reach both Entrance Stairs-Number of Steps: 4 Home Layout: Two level Home Equipment: Shower seat      Prior Function Level of Independence: Independent               Hand Dominance        Extremity/Trunk Assessment   Upper Extremity Assessment Upper Extremity Assessment: LUE deficits/detail LUE Deficits / Details: Reports mild numbness, however, is much improved.     Lower Extremity Assessment Lower Extremity Assessment: LLE deficits/detail LLE Deficits / Details: Reports  mild numbness, however, reports much improved.     Cervical / Trunk Assessment Cervical / Trunk Assessment: Normal  Communication   Communication: No difficulties  Cognition Arousal/Alertness: Awake/alert Behavior During  Therapy: WFL for tasks assessed/performed Overall Cognitive Status: Within Functional Limits for tasks assessed                                        General Comments      Exercises     Assessment/Plan    PT Assessment Patent does not need any further PT services  PT Problem List         PT Treatment Interventions      PT Goals (Current goals can be found in the Care Plan section)  Acute Rehab PT Goals Patient Stated Goal: to go home PT Goal Formulation: With patient Time For Goal Achievement: 09/15/19 Potential to Achieve Goals: Good    Frequency     Barriers to discharge        Co-evaluation               AM-PAC PT "6 Clicks" Mobility  Outcome Measure Help needed turning from your back to your side while in a flat bed without using bedrails?: None Help needed moving from lying on your back to sitting on the side of a flat bed without using bedrails?: None Help needed moving to and from a bed to a chair (including a wheelchair)?: None Help needed standing up from a chair using your arms (e.g., wheelchair or bedside chair)?: None Help needed to walk in hospital room?: None Help needed climbing 3-5 steps with a railing? : None 6 Click Score: 24    End of Session Equipment Utilized During Treatment: Gait belt Activity Tolerance: Patient tolerated treatment well Patient left: in bed;with call bell/phone within reach Nurse Communication: Mobility status PT Visit Diagnosis: Other abnormalities of gait and mobility (R26.89)    Time: 1430-1450 PT Time Calculation (min) (ACUTE ONLY): 20 min   Charges:   PT Evaluation $PT Eval Low Complexity: 1 Low          Lou Miner, DPT  Acute Rehabilitation Services  Pager: [phone] Office: [phone]   Rudean Hitt 09/15/2019, 3:31 PM

## 2019-09-15 NOTE — Progress Notes (Signed)
  Echocardiogram 2D Echocardiogram has been performed.  Cynthia [name] 09/15/2019, 1:53 PM

## 2019-09-15 NOTE — H&P (Addendum)
History and Physical    Cynthia [name] DOB: 10-17-1945 DOA: 09/14/2019  PCP: Colon Branch, MD  Patient coming from: Home.  Chief Complaint: Left-sided numbness.  HPI: Cynthia [name] is a 74 y.o. female with history of migraine on prophylactic verapamil depression started experiencing left-sided numbness around 1:30 PM yesterday after lunch.  The numbness started from the leg going upwards to her arms and face.  All of them was on the left side.  Did not have any weakness of the extremities or any difficulty speaking swallowing or any visual symptoms.  Patient does not recall how long it lasted for by the time patient reached the ER it has resolved.  Patient had some mild chest tightness.  Which is resolved.  ED Course: In the ER patient appeared nonfocal CT head was concerning for some acute findings and patient was transferred from [facility] to [facility] for MRI brain which did not show anything acute.  Neurology on-call was consulted and patient was admitted for further TIA work-up.  EKG shows normal sinus rhythm.  Covid test is pending.  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  Review of Systems: As per HPI, rest all negative.   Past Medical History:  Diagnosis Date  . Allergy   . Anxiety   . Arthritis   . Blood transfusion without reported diagnosis   . Constipation, chronic   . DEGENERATIVE JOINT DISEASE 06/02/2010  . Depression   . Glaucoma 2014   Right eye  . Headache(784.0)   . Hyperlipidemia    hx of  . Osteopenia   . Psoriasis   . PUD (peptic ulcer disease) 2012   Upper GI bleed d/t NSAIDs  . RLS (restless legs syndrome)    robaxin prn    Past Surgical History:  Procedure Laterality Date  . ANKLE FRACTURE SURGERY Left    surgery 12-11 (LEFT)  . [place]     reports that she has never smoked. She has never used smokeless tobacco. She reports current alcohol use. She reports that she does not  use drugs.  Allergies  Allergen Reactions  . Motrin [Ibuprofen]     GI bleed  . Pollen Extract   . Statins     "delibitating pain all over body"  . Sulfonamide Derivatives     Pt unsure of reaction  . Ciprofloxacin Swelling and Rash    Family History  Problem Relation Age of Onset  . Aneurysm Mother        brain  . Celiac disease Brother   . Diabetes Maternal Grandfather   . Hypertension Neg Hx   . Breast cancer Neg Hx   . Coronary artery disease Neg Hx   . Colon cancer Neg Hx   . Esophageal cancer Neg Hx   . Stomach cancer Neg Hx   . Rectal cancer Neg Hx     Prior to Admission medications   Medication Sig Start Date End Date Taking? Authorizing Provider  buPROPion (WELLBUTRIN XL) 300 MG 24 hr tablet Take 1 tablet (300 mg total) by mouth daily. 08/25/19  Yes Paz, Alda Berthold, MD  cetirizine (ZYRTEC) 10 MG tablet Take 10 mg by mouth at bedtime as needed for allergies.    Yes [provider]  methocarbamol (ROBAXIN) 500 MG tablet Take 1 tablet (500 mg total) by mouth 3 (three) times daily as needed. Patient taking differently: Take 500 mg by mouth 3 (three)  times daily as needed for muscle spasms.  09/02/19  Yes Paz, Alda Berthold, MD  nystatin (MYCOSTATIN) 100000 UNIT/ML suspension Take 5 mLs (500,000 Units total) by mouth 4 (four) times daily. 09/05/19  Yes Roma Schanz R, DO  pantoprazole (PROTONIX) 40 MG tablet Take 1 tablet (40 mg total) by mouth daily. Patient taking differently: Take 40 mg by mouth daily as needed (for acid reflux).  05/01/19  Yes Paz, Alda Berthold, MD  sertraline (ZOLOFT) 100 MG tablet Take 1 tablet (100 mg total) by mouth daily. Patient taking differently: Take 100 mg by mouth at bedtime.  07/28/19  Yes Paz, Alda Berthold, MD  Travoprost, BAK Free, (TRAVATAN) 0.004 % SOLN ophthalmic solution Place 1 drop into the right eye at bedtime.   Yes [provider]  valACYclovir (VALTREX) 1000 MG tablet Take 1 tablet (1,000 mg total) by mouth 3 (three) times daily.  09/05/19  Yes Roma Schanz R, DO  verapamil (CALAN-SR) 180 MG CR tablet Take 1 tablet (180 mg total) by mouth at bedtime. 04/17/19  Yes Jaffe, Adam R, DO  zolpidem (AMBIEN) 5 MG tablet Take 1 tablet (5 mg total) by mouth at bedtime as needed for sleep. 07/04/19  Yes Paz, Alda Berthold, MD  azelastine (ASTELIN) 0.1 % nasal spray Place 2 sprays into both nostrils at bedtime. Patient not taking: Reported on 09/14/2019 09/21/14   Colon Branch, MD  nystatin (MYCOSTATIN) 100000 UNIT/ML suspension Take 5 mLs (500,000 Units total) by mouth 4 (four) times daily. Patient not taking: Reported on 09/14/2019 05/16/19   Sharion Balloon, NP  ondansetron (ZOFRAN) 4 MG tablet Take 1 tablet (4 mg total) by mouth every 8 (eight) hours as needed for nausea or vomiting. Patient not taking: Reported on 09/14/2019 01/21/18   Luetta Nutting, DO  SUMAtriptan (IMITREX) 100 MG tablet TAKE 1 TABLET BY MOUTH AT EARLIEST ONSET OF HEADACHE. MAY REPEAT ONCE IN 2 HOURS IF HEADACHE PERSISTS OR RECURS. Patient not taking: Reported on 09/14/2019 05/27/18   Pieter Partridge, DO  traMADol (ULTRAM) 50 MG tablet Take 1 tablet (50 mg total) by mouth every 6 (six) hours as needed. Patient not taking: Reported on 09/14/2019 05/19/19   Colon Branch, MD  valACYclovir (VALTREX) 1000 MG tablet Take 1 tablet (1,000 mg total) by mouth 3 (three) times daily. Patient not taking: Reported on 09/14/2019 05/19/19   Colon Branch, MD    Physical Exam: Constitutional: Moderately built and nourished. Vitals:   09/14/19 2030 09/14/19 2045 09/15/19 0045 09/15/19 0052  BP: 136/83 137/74 122/67   Pulse: 66 70 67   Resp: 13 12 16    Temp:    97.8 F (36.6 C)  TempSrc:      SpO2: 96% 100% 97%   Weight:      Height:       Eyes: Anicteric no pallor. ENMT: No discharge from the ears eyes nose or mouth. Neck: No mass felt.  No neck rigidity. Respiratory: No rhonchi or crepitations. Cardiovascular: S1-S2 heard. Abdomen: Soft nontender bowel sounds present. Musculoskeletal: No  edema. Skin: No rash. Neurologic: Alert awake oriented to time place and person.  Moves all extremities 5 x 5.  No facial asymmetry tongue is midline. Psychiatric: Appears normal but normal affect.   Labs on Admission: I have personally reviewed following labs and imaging studies  CBC: Recent Labs  Lab 09/14/19 1530  WBC 7.6  NEUTROABS 4.0  HGB 12.7  HCT 37.5  MCV 93.1  PLT 343  Basic Metabolic Panel: Recent Labs  Lab 09/14/19 1530  NA 133*  K 4.1  CL 100  CO2 24  GLUCOSE 86  BUN 11  CREATININE 0.75  CALCIUM 9.3   GFR: Estimated Creatinine Clearance: 60.6 mL/min (by C-G formula based on SCr of 0.75 mg/dL). Liver Function Tests: Recent Labs  Lab 09/14/19 1530  AST 24  ALT 20  ALKPHOS 96  BILITOT 0.5  PROT 6.7  ALBUMIN 4.3   No results for input(s): LIPASE, AMYLASE in the last 168 hours. No results for input(s): AMMONIA in the last 168 hours. Coagulation Profile: Recent Labs  Lab 09/14/19 1530  INR 0.9   Cardiac Enzymes: No results for input(s): CKTOTAL, CKMB, CKMBINDEX, TROPONINI in the last 168 hours. BNP (last 3 results) No results for input(s): PROBNP in the last 8760 hours. HbA1C: No results for input(s): HGBA1C in the last 72 hours. CBG: No results for input(s): GLUCAP in the last 168 hours. Lipid Profile: No results for input(s): CHOL, HDL, LDLCALC, TRIG, CHOLHDL, LDLDIRECT in the last 72 hours. Thyroid Function Tests: No results for input(s): TSH, T4TOTAL, FREET4, T3FREE, THYROIDAB in the last 72 hours. Anemia Panel: No results for input(s): VITAMINB12, FOLATE, FERRITIN, TIBC, IRON, RETICCTPCT in the last 72 hours. Urine analysis:    Component Value Date/Time   COLORURINE YELLOW 07/05/2016 [place] 07/05/2016 1637   LABSPEC <=1.005 (A) 07/05/2016 1637   PHURINE 7.0 07/05/2016 1637   GLUCOSEU NEGATIVE 07/05/2016 1637   HGBUR NEGATIVE 07/05/2016 1637   BILIRUBINUR negative 04/02/2017 0949   KETONESUR NEGATIVE  07/05/2016 1637   PROTEINUR negative 04/02/2017 0949   PROTEINUR NEGATIVE 10/24/2010 2115   UROBILINOGEN 0.2 04/02/2017 0949   UROBILINOGEN 0.2 07/05/2016 1637   NITRITE negative 04/02/2017 0949   NITRITE NEGATIVE 07/05/2016 1637   LEUKOCYTESUR Negative 04/02/2017 0949   Sepsis Labs: @LABRCNTIP (procalcitonin:4,lacticidven:4) )No results found for this or any previous visit (from the past 240 hour(s)).   Radiological Exams on Admission: MR ANGIO HEAD WO CONTRAST  Result Date: 09/14/2019 CLINICAL DATA:  Left-sided paresthesia. EXAM: MRI HEAD WITHOUT AND WITH CONTRAST MRA HEAD WITHOUT CONTRAST MRA NECK WITHOUT AND WITH CONTRAST TECHNIQUE: Multiplanar, multiecho pulse sequences of the brain and surrounding structures were obtained without and with intravenous contrast. Angiographic images of the Circle of Willis were obtained using MRA technique without intravenous contrast. Angiographic images of the neck were obtained using MRA technique without and with intravenous contrast. Carotid stenosis measurements (when applicable) are obtained utilizing NASCET criteria, using the distal internal carotid diameter as the denominator. CONTRAST:  7.19mL GADAVIST GADOBUTROL 1 MMOL/ML IV SOLN COMPARISON:  Brain MRI 06/10/2015 FINDINGS: MRI HEAD FINDINGS BRAIN: The midline structures are normal. There is no acute infarct, acute hemorrhage or mass. Multifocal white matter hyperintensity, most commonly due to chronic ischemic microangiopathy. The CSF spaces are normal for age, with no hydrocephalus. Blood-sensitive sequences show no chronic microhemorrhage or superficial siderosis. SKULL AND UPPER CERVICAL SPINE: The visualized skull base, calvarium, upper cervical spine and extracranial soft tissues are normal. SINUSES/ORBITS: No fluid levels or advanced mucosal thickening. No mastoid or middle ear effusion. The orbits are normal. MRA HEAD FINDINGS POSTERIOR CIRCULATION: --Basilar artery: Normal. --Posterior cerebral  arteries: Normal. Both originate from the basilar artery. --Superior cerebellar arteries: Normal. --Inferior cerebellar arteries: Normal anterior and posterior inferior cerebellar arteries. ANTERIOR CIRCULATION: --Intracranial internal carotid arteries: Normal. 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 Anterior cerebral arteries are patent. --Middle cerebral arteries: Normal. --Posterior communicating  arteries: Diminutive or absent bilaterally. MRA NECK FINDINGS Aortic arch: Normal 3 vessel aortic branching pattern. The visualized subclavian arteries are normal. Right carotid system: Normal course and caliber without stenosis or evidence of dissection. Left carotid system: Normal course and caliber without stenosis or evidence of dissection. Vertebral arteries: Left dominant. Vertebral artery origins are normal. Vertebral arteries are normal in course and caliber to the vertebrobasilar confluence without stenosis or evidence of dissection. IMPRESSION: 1. Mild chronic small vessel disease without acute intracranial abnormality. 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 3. Otherwise normal head and neck MRA. Electronically Signed   By: Ulyses Jarred M.D.   On: 09/14/2019 23:06   MR Angiogram Neck W or Wo Contrast  Result Date: 09/14/2019 CLINICAL DATA:  Left-sided paresthesia. EXAM: MRI HEAD WITHOUT AND WITH CONTRAST MRA HEAD WITHOUT CONTRAST MRA NECK WITHOUT AND WITH CONTRAST TECHNIQUE: Multiplanar, multiecho pulse sequences of the brain and surrounding structures were obtained without and with intravenous contrast. Angiographic images of the Circle of Willis were obtained using MRA technique without intravenous contrast. Angiographic images of the neck were obtained using MRA technique without and with intravenous contrast. Carotid stenosis measurements (when applicable) are obtained utilizing NASCET criteria, using the distal internal carotid diameter  as the denominator. CONTRAST:  7.75mL GADAVIST GADOBUTROL 1 MMOL/ML IV SOLN COMPARISON:  Brain MRI 06/10/2015 FINDINGS: MRI HEAD FINDINGS BRAIN: The midline structures are normal. There is no acute infarct, acute hemorrhage or mass. Multifocal white matter hyperintensity, most commonly due to chronic ischemic microangiopathy. The CSF spaces are normal for age, with no hydrocephalus. Blood-sensitive sequences show no chronic microhemorrhage or superficial siderosis. SKULL AND UPPER CERVICAL SPINE: The visualized skull base, calvarium, upper cervical spine and extracranial soft tissues are normal. SINUSES/ORBITS: No fluid levels or advanced mucosal thickening. No mastoid or middle ear effusion. The orbits are normal. MRA HEAD FINDINGS POSTERIOR CIRCULATION: --Basilar artery: Normal. --Posterior cerebral arteries: Normal. Both originate from the basilar artery. --Superior cerebellar arteries: Normal. --Inferior cerebellar arteries: Normal anterior and posterior inferior cerebellar arteries. ANTERIOR CIRCULATION: --Intracranial internal carotid arteries: Normal. 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 Anterior cerebral arteries are patent. --Middle cerebral arteries: Normal. --Posterior communicating arteries: Diminutive or absent bilaterally. MRA NECK FINDINGS Aortic arch: Normal 3 vessel aortic branching pattern. The visualized subclavian arteries are normal. Right carotid system: Normal course and caliber without stenosis or evidence of dissection. Left carotid system: Normal course and caliber without stenosis or evidence of dissection. Vertebral arteries: Left dominant. Vertebral artery origins are normal. Vertebral arteries are normal in course and caliber to the vertebrobasilar confluence without stenosis or evidence of dissection. IMPRESSION: 1. Mild chronic small vessel disease without acute intracranial abnormality. 2. 2 mm anterior communicating artery  aneurysm. 3. Otherwise normal head and neck MRA. Electronically Signed   By: Ulyses Jarred M.D.   On: 09/14/2019 23:06   MR Brain W and Wo Contrast  Result Date: 09/14/2019 CLINICAL DATA:  Left-sided paresthesia. EXAM: MRI HEAD WITHOUT AND WITH CONTRAST MRA HEAD WITHOUT CONTRAST MRA NECK WITHOUT AND WITH CONTRAST TECHNIQUE: Multiplanar, multiecho pulse sequences of the brain and surrounding structures were obtained without and with intravenous contrast. Angiographic images of the Circle of Willis were obtained using MRA technique without intravenous contrast. Angiographic images of the neck were obtained using MRA technique without and with intravenous contrast. Carotid stenosis measurements (when applicable) are obtained utilizing NASCET criteria, using the distal internal carotid diameter as the denominator. CONTRAST:  7.46mL GADAVIST GADOBUTROL 1  MMOL/ML IV SOLN COMPARISON:  Brain MRI 06/10/2015 FINDINGS: MRI HEAD FINDINGS BRAIN: The midline structures are normal. There is no acute infarct, acute hemorrhage or mass. Multifocal white matter hyperintensity, most commonly due to chronic ischemic microangiopathy. The CSF spaces are normal for age, with no hydrocephalus. Blood-sensitive sequences show no chronic microhemorrhage or superficial siderosis. SKULL AND UPPER CERVICAL SPINE: The visualized skull base, calvarium, upper cervical spine and extracranial soft tissues are normal. SINUSES/ORBITS: No fluid levels or advanced mucosal thickening. No mastoid or middle ear effusion. The orbits are normal. MRA HEAD FINDINGS POSTERIOR CIRCULATION: --Basilar artery: Normal. --Posterior cerebral arteries: Normal. Both originate from the basilar artery. --Superior cerebellar arteries: Normal. --Inferior cerebellar arteries: Normal anterior and posterior inferior cerebellar arteries. ANTERIOR CIRCULATION: --Intracranial internal carotid arteries: Normal. --Anterior cerebral arteries: 2 mm anterior communicating artery  aneurysm projecting superiorly. Anterior cerebral arteries are patent. --Middle cerebral arteries: Normal. --Posterior communicating arteries: Diminutive or absent bilaterally. MRA NECK FINDINGS Aortic arch: Normal 3 vessel aortic branching pattern. The visualized subclavian arteries are normal. Right carotid system: Normal course and caliber without stenosis or evidence of dissection. Left carotid system: Normal course and caliber without stenosis or evidence of dissection. Vertebral arteries: Left dominant. Vertebral artery origins are normal. Vertebral arteries are normal in course and caliber to the vertebrobasilar confluence without stenosis or evidence of dissection. IMPRESSION: 1. Mild chronic small vessel disease without acute intracranial abnormality. 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 3. Otherwise normal head and neck MRA. Electronically Signed   By: Ulyses Jarred M.D.   On: 09/14/2019 23:06   CT HEAD CODE STROKE WO CONTRAST  Result Date: 09/14/2019 CLINICAL DATA:  Code stroke. Acute neuro deficit. Left leg numbness. EXAM: CT HEAD WITHOUT CONTRAST TECHNIQUE: Contiguous axial images were obtained from the base of the skull through the vertex without intravenous contrast. COMPARISON:  MRI head 06/10/2015 FINDINGS: Brain: Hypodensity in the right anterior internal capsule compatible with infarct. This was not present on the prior MRI. This could be acute. Ventricle size normal.  Negative for acute hemorrhage or mass. Vascular: Negative for hyperdense vessel Skull: Negative Sinuses/Orbits: Paranasal sinuses clear. Bilateral cataract extraction. Other: None ASPECTS ([place] Stroke Program Early CT Score) - Ganglionic level infarction (caudate, lentiform nuclei, internal capsule, insula, M1-M3 cortex): 6 - Supraganglionic infarction (M4-M6 cortex): 3 Total score (0-10 with 10 being normal): 9 IMPRESSION: 1. Hypodensity anterior aspect of right internal capsule compatible with infarct. This was  not present in 2017 and could be acute. No acute hemorrhage. 2. ASPECTS is 9 3. These results were called by telephone at the time of interpretation on 09/14/2019 at 3:17 pm to provider [facility] , who verbally acknowledged these results. Electronically Signed   By: Franchot Gallo M.D.   On: 09/14/2019 15:17    EKG: Independently reviewed.  Normal sinus rhythm.  Assessment/Plan Principal Problem:   TIA (transient ischemic attack) Active Problems:   Anxiety and depression   Essential tremor    1. TIA -appreciate neurology consult and recommendation discussed with Dr. Leonel Ramsay.  At this time patient is on antiplatelet agents.  Check 2D echo Place patient on neurochecks start diet if patient passes swallow.  Check hemoglobin A1c lipid panel physical therapy. 2. History of anxiety and depression on bupropion and Zoloft. 3. History of migraine on verapamil as prophylactic. 4. Aneurysm of the intracranial artery -per neurology.  Covid test is pending.   DVT prophylaxis: Lovenox. Code Status: Full code. Family Communication: Discussed with patient. Disposition Plan: Home.  Consults called: Neurology. Admission status: Observation.   Rise Patience MD [facility] Pager [phone].  If 7PM-7AM, please contact night-coverage [email or web address] Password [email or web address]  09/15/2019, 1:03 AM

## 2019-09-15 NOTE — ED Notes (Signed)
Attempted report 

## 2019-09-15 NOTE — Progress Notes (Signed)
OT Cancellation Note  Patient Details Name: Cynthia [name] MRN: [id number] DOB: 21-Jan-1946   Cancelled Treatment:    Reason Eval/Treat Not Completed: OT screened, no needs identified, will sign off; spoke with evaluating PT who confirmed pt mobilizing and performing ADL without assist at this time. Spoke with pt who also confirmed feeling at her baseline regarding ADL/mobility completion, reports initial LUE symptoms have subsided. No further acute OT needs identified, will sign off at this time. Please re-consult should pt's needs change.  Lou Cal, OT Acute Rehabilitation Services Pager [phone] Office [place] 09/15/2019, 4:26 PM

## 2019-09-15 NOTE — Discharge Summary (Signed)
Discharge Summary  Cynthia [name] [id number] DOB: 02-Nov-1945  PCP: Colon Branch, MD  Admit date: 09/14/2019 Discharge date: 09/15/2019  Time spent: 35 minutes  Recommendations for Outpatient Follow-up:  1. Follow-up with neurology 2. Follow-up with your primary care provider 3. Take your medications as prescribed  Discharge Diagnoses:  Active Hospital Problems   Diagnosis Date Noted  . TIA (transient ischemic attack) 09/14/2019  . Essential tremor 07/25/2017  . Anxiety and depression 08/17/2006    Resolved Hospital Problems  No resolved problems to display.    Discharge Condition: Stable  Diet recommendation: Resume previous diet  Vitals:   09/15/19 0600 09/15/19 1130  BP: (!) 110/57 111/60  Pulse: 65 73  Resp: 14 16  Temp:  97.8 F (36.6 C)  SpO2: 95% 100%    History of present illness:  Cynthia Schmeltz Smithis a 74 y.o.femalewithhistory of migraine on prophylactic verapamil depression started experiencing left-sided numbness around 1:30 PM yesterday after lunch. The numbness started from the leg going upwards to her arms and face. All of them was on the left side. Did not have any weakness of the extremities or any difficulty speaking swallowing or any visual symptoms. Patient does not recall how long it lasted for by the time patient reached the ER it has resolved. Patient had some mild chest tightness. Which is resolved.  09/15/19: Seen and examined in the ED.  Symptoms have resolved.  Ongoing work-up for TIA.  Seen by neurology, Dr. Erlinda Hong, recommends aspirin and Plavix x3 weeks then aspirin alone.  Hospital Course:  Principal Problem:   TIA (transient ischemic attack) Active Problems:   Anxiety and depression   Essential tremor  TIA  2D echo normal LVEF 60 to 65% with no regional wall motion abnormalities.  Grade 1 diastolic dysfunction.  No evidence of thrombus or PFO.  MRA head without contrast: IMPRESSION: Mild chronic small vessel disease without acute  intracranial abnormality, 2 mm anterior communicating artery aneurysm. Otherwise normal head and neck MRA.  Hemoglobin A1c 5.2, goal less than 7.0.  LDL 162 goal less than 70.  Per neurologist recommendation take aspirin and Plavix x3 weeks and then aspirin alone.  Follow-up with your neurologist outpatient.  No PT OT follow-up.  History of anxiety and depression Stable On bupropion and Zoloft. Continue your home medications and follow-up with your primary care provider  History of migraine On verapamil as prophylactic. Follow-up with your neurologist  Aneurysm of the anterior communicating artery   Follow-up with your neurologist   Covid test is negative.   Code Status: Full code.  Consults called: Neurology.   Discharge Exam: BP 111/60 (BP Location: Left Arm)   Pulse 73   Temp 97.8 F (36.6 C) (Oral)   Resp 16   Ht 5\' 5"  (1.651 m)   Wt 72.6 kg   SpO2 100%   BMI 26.63 kg/m  . General: 74 y.o. year-old female well developed well nourished in no acute distress.  Alert and oriented x3. . Cardiovascular: Regular rate and rhythm with no rubs or gallops.  No thyromegaly or JVD noted.   Marland Kitchen Respiratory: Clear to auscultation with no wheezes or rales. Good inspiratory effort. . Abdomen: Soft nontender nondistended with normal bowel sounds x4 quadrants. . Musculoskeletal: No lower extremity edema. Marland Kitchen Psychiatry: Mood is appropriate for condition and setting  Discharge Instructions You were cared for by a hospitalist during your hospital stay. If you have any questions about your discharge medications or the care you received while  you were in the hospital after you are discharged, you can call the unit and asked to speak with the hospitalist on call if the hospitalist that took care of you is not available. Once you are discharged, your primary care physician will handle any further medical issues. Please note that NO REFILLS for any discharge medications will be authorized once you  are discharged, as it is imperative that you return to your primary care physician (or establish a relationship with a primary care physician if you do not have one) for your aftercare needs so that they can reassess your need for medications and monitor your lab values.   Allergies as of 09/15/2019      Reactions   Motrin [ibuprofen]    GI bleed   Pollen Extract    Statins    "delibitating pain all over body"   Sulfonamide Derivatives    Pt unsure of reaction   Ciprofloxacin Swelling, Rash      Medication List    STOP taking these medications   azelastine 0.1 % nasal spray Commonly known as: ASTELIN   ondansetron 4 MG tablet Commonly known as: Zofran   SUMAtriptan 100 MG tablet Commonly known as: IMITREX     TAKE these medications    stroke: mapping our early stages of recovery book Misc 1 each by Does not apply route once for 1 dose.   aspirin 81 MG EC tablet Take 1 tablet (81 mg total) by mouth daily. Start taking on: Sep 16, 2019   buPROPion 300 MG 24 hr tablet Commonly known as: WELLBUTRIN XL Take 1 tablet (300 mg total) by mouth daily.   cetirizine 10 MG tablet Commonly known as: ZYRTEC Take 10 mg by mouth at bedtime as needed for allergies.   clopidogrel 75 MG tablet Commonly known as: PLAVIX Take 1 tablet (75 mg total) by mouth daily for 21 days. Start taking on: Sep 16, 2019   ezetimibe 10 MG tablet Commonly known as: ZETIA Take 1 tablet (10 mg total) by mouth daily.   methocarbamol 500 MG tablet Commonly known as: ROBAXIN Take 1 tablet (500 mg total) by mouth 3 (three) times daily as needed. What changed: reasons to take this   nystatin 100000 UNIT/ML suspension Commonly known as: MYCOSTATIN Take 5 mLs (500,000 Units total) by mouth 4 (four) times daily. What changed: Another medication with the same name was removed. Continue taking this medication, and follow the directions you see here.   pantoprazole 40 MG tablet Commonly known as:  PROTONIX Take 1 tablet (40 mg total) by mouth daily. What changed:   when to take this  reasons to take this   sertraline 100 MG tablet Commonly known as: ZOLOFT Take 1 tablet (100 mg total) by mouth daily. What changed: when to take this   traMADol 50 MG tablet Commonly known as: ULTRAM Take 1 tablet (50 mg total) by mouth every 6 (six) hours as needed.   Travoprost (BAK Free) 0.004 % Soln ophthalmic solution Commonly known as: TRAVATAN Place 1 drop into the right eye at bedtime.   valACYclovir 1000 MG tablet Commonly known as: Valtrex Take 1 tablet (1,000 mg total) by mouth 3 (three) times daily. What changed: Another medication with the same name was removed. Continue taking this medication, and follow the directions you see here.   verapamil 180 MG CR tablet Commonly known as: CALAN-SR Take 1 tablet (180 mg total) by mouth at bedtime.   zolpidem 5 MG tablet Commonly known  as: AMBIEN Take 1 tablet (5 mg total) by mouth at bedtime as needed for sleep.      Allergies  Allergen Reactions  . Motrin [Ibuprofen]     GI bleed  . Pollen Extract   . Statins     "delibitating pain all over body"  . Sulfonamide Derivatives     Pt unsure of reaction  . Ciprofloxacin Swelling and Rash   Follow-up Information    Colon Branch, MD. Call in 1 day(s).   Specialty: Internal Medicine Why: Please call for a post hospital follow-up appointment. Contact information: [address] [place] [address] [phone]        Pieter Partridge, DO. Call in 1 day(s).   Specialty: Neurology Why: Please call for a post hospital follow-up appointment. Contact information: [address] [place] [address] [phone]            The results of significant diagnostics from this hospitalization (including imaging, microbiology, ancillary and laboratory) are listed below for reference.    Significant Diagnostic Studies: MR ANGIO HEAD WO  CONTRAST  Result Date: 09/14/2019 CLINICAL DATA:  Left-sided paresthesia. EXAM: MRI HEAD WITHOUT AND WITH CONTRAST MRA HEAD WITHOUT CONTRAST MRA NECK WITHOUT AND WITH CONTRAST TECHNIQUE: Multiplanar, multiecho pulse sequences of the brain and surrounding structures were obtained without and with intravenous contrast. Angiographic images of the Circle of Willis were obtained using MRA technique without intravenous contrast. Angiographic images of the neck were obtained using MRA technique without and with intravenous contrast. Carotid stenosis measurements (when applicable) are obtained utilizing NASCET criteria, using the distal internal carotid diameter as the denominator. CONTRAST:  7.28mL GADAVIST GADOBUTROL 1 MMOL/ML IV SOLN COMPARISON:  Brain MRI 06/10/2015 FINDINGS: MRI HEAD FINDINGS BRAIN: The midline structures are normal. There is no acute infarct, acute hemorrhage or mass. Multifocal white matter hyperintensity, most commonly due to chronic ischemic microangiopathy. The CSF spaces are normal for age, with no hydrocephalus. Blood-sensitive sequences show no chronic microhemorrhage or superficial siderosis. SKULL AND UPPER CERVICAL SPINE: The visualized skull base, calvarium, upper cervical spine and extracranial soft tissues are normal. SINUSES/ORBITS: No fluid levels or advanced mucosal thickening. No mastoid or middle ear effusion. The orbits are normal. MRA HEAD FINDINGS POSTERIOR CIRCULATION: --Basilar artery: Normal. --Posterior cerebral arteries: Normal. Both originate from the basilar artery. --Superior cerebellar arteries: Normal. --Inferior cerebellar arteries: Normal anterior and posterior inferior cerebellar arteries. ANTERIOR CIRCULATION: --Intracranial internal carotid arteries: Normal. 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 Anterior cerebral arteries are patent. --Middle cerebral arteries: Normal. --Posterior communicating arteries:  Diminutive or absent bilaterally. MRA NECK FINDINGS Aortic arch: Normal 3 vessel aortic branching pattern. The visualized subclavian arteries are normal. Right carotid system: Normal course and caliber without stenosis or evidence of dissection. Left carotid system: Normal course and caliber without stenosis or evidence of dissection. Vertebral arteries: Left dominant. Vertebral artery origins are normal. Vertebral arteries are normal in course and caliber to the vertebrobasilar confluence without stenosis or evidence of dissection. IMPRESSION: 1. Mild chronic small vessel disease without acute intracranial abnormality. 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 3. Otherwise normal head and neck MRA. Electronically Signed   By: Ulyses Jarred M.D.   On: 09/14/2019 23:06   MR Angiogram Neck W or Wo Contrast  Result Date: 09/14/2019 CLINICAL DATA:  Left-sided paresthesia. EXAM: MRI HEAD WITHOUT AND WITH CONTRAST MRA HEAD WITHOUT CONTRAST MRA NECK WITHOUT AND WITH CONTRAST TECHNIQUE:  Multiplanar, multiecho pulse sequences of the brain and surrounding structures were obtained without and with intravenous contrast. Angiographic images of the Circle of Willis were obtained using MRA technique without intravenous contrast. Angiographic images of the neck were obtained using MRA technique without and with intravenous contrast. Carotid stenosis measurements (when applicable) are obtained utilizing NASCET criteria, using the distal internal carotid diameter as the denominator. CONTRAST:  7.74mL GADAVIST GADOBUTROL 1 MMOL/ML IV SOLN COMPARISON:  Brain MRI 06/10/2015 FINDINGS: MRI HEAD FINDINGS BRAIN: The midline structures are normal. There is no acute infarct, acute hemorrhage or mass. Multifocal white matter hyperintensity, most commonly due to chronic ischemic microangiopathy. The CSF spaces are normal for age, with no hydrocephalus. Blood-sensitive sequences show no chronic microhemorrhage or superficial siderosis.  SKULL AND UPPER CERVICAL SPINE: The visualized skull base, calvarium, upper cervical spine and extracranial soft tissues are normal. SINUSES/ORBITS: No fluid levels or advanced mucosal thickening. No mastoid or middle ear effusion. The orbits are normal. MRA HEAD FINDINGS POSTERIOR CIRCULATION: --Basilar artery: Normal. --Posterior cerebral arteries: Normal. Both originate from the basilar artery. --Superior cerebellar arteries: Normal. --Inferior cerebellar arteries: Normal anterior and posterior inferior cerebellar arteries. ANTERIOR CIRCULATION: --Intracranial internal carotid arteries: Normal. 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 Anterior cerebral arteries are patent. --Middle cerebral arteries: Normal. --Posterior communicating arteries: Diminutive or absent bilaterally. MRA NECK FINDINGS Aortic arch: Normal 3 vessel aortic branching pattern. The visualized subclavian arteries are normal. Right carotid system: Normal course and caliber without stenosis or evidence of dissection. Left carotid system: Normal course and caliber without stenosis or evidence of dissection. Vertebral arteries: Left dominant. Vertebral artery origins are normal. Vertebral arteries are normal in course and caliber to the vertebrobasilar confluence without stenosis or evidence of dissection. IMPRESSION: 1. Mild chronic small vessel disease without acute intracranial abnormality. 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 3. Otherwise normal head and neck MRA. Electronically Signed   By: Ulyses Jarred M.D.   On: 09/14/2019 23:06   MR Brain W and Wo Contrast  Result Date: 09/14/2019 CLINICAL DATA:  Left-sided paresthesia. EXAM: MRI HEAD WITHOUT AND WITH CONTRAST MRA HEAD WITHOUT CONTRAST MRA NECK WITHOUT AND WITH CONTRAST TECHNIQUE: Multiplanar, multiecho pulse sequences of the brain and surrounding structures were obtained without and with intravenous contrast.  Angiographic images of the Circle of Willis were obtained using MRA technique without intravenous contrast. Angiographic images of the neck were obtained using MRA technique without and with intravenous contrast. Carotid stenosis measurements (when applicable) are obtained utilizing NASCET criteria, using the distal internal carotid diameter as the denominator. CONTRAST:  7.57mL GADAVIST GADOBUTROL 1 MMOL/ML IV SOLN COMPARISON:  Brain MRI 06/10/2015 FINDINGS: MRI HEAD FINDINGS BRAIN: The midline structures are normal. There is no acute infarct, acute hemorrhage or mass. Multifocal white matter hyperintensity, most commonly due to chronic ischemic microangiopathy. The CSF spaces are normal for age, with no hydrocephalus. Blood-sensitive sequences show no chronic microhemorrhage or superficial siderosis. SKULL AND UPPER CERVICAL SPINE: The visualized skull base, calvarium, upper cervical spine and extracranial soft tissues are normal. SINUSES/ORBITS: No fluid levels or advanced mucosal thickening. No mastoid or middle ear effusion. The orbits are normal. MRA HEAD FINDINGS POSTERIOR CIRCULATION: --Basilar artery: Normal. --Posterior cerebral arteries: Normal. Both originate from the basilar artery. --Superior cerebellar arteries: Normal. --Inferior cerebellar arteries: Normal anterior and posterior inferior cerebellar arteries. ANTERIOR CIRCULATION: --Intracranial internal carotid arteries: Normal. 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 Anterior cerebral arteries are patent. --Middle  cerebral arteries: Normal. --Posterior communicating arteries: Diminutive or absent bilaterally. MRA NECK FINDINGS Aortic arch: Normal 3 vessel aortic branching pattern. The visualized subclavian arteries are normal. Right carotid system: Normal course and caliber without stenosis or evidence of dissection. Left carotid system: Normal course and caliber without stenosis or evidence  of dissection. Vertebral arteries: Left dominant. Vertebral artery origins are normal. Vertebral arteries are normal in course and caliber to the vertebrobasilar confluence without stenosis or evidence of dissection. IMPRESSION: 1. Mild chronic small vessel disease without acute intracranial abnormality. 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 3. Otherwise normal head and neck MRA. Electronically Signed   By: Ulyses Jarred M.D.   On: 09/14/2019 23:06   ECHOCARDIOGRAM COMPLETE  Result Date: 09/15/2019    ECHOCARDIOGRAM REPORT   Patient Name:   REAGANN BASGALL Date of Exam: 09/15/2019 Medical Rec #:  [id number]       Height:       65.0 in Accession #:    [id number]      Weight:       160.0 lb Date of Birth:  May 13, 1946        BSA:          1.799 m Patient Age:    35 years        BP:           110/57 mmHg Patient Gender: F               HR:           70 bpm. Exam Location:  Inpatient Procedure: 2D Echo Indications:    TIA 435.9  History:        Patient has no prior history of Echocardiogram examinations.  Sonographer:    Johny Chess Referring Phys: [place]  1. Left ventricular ejection fraction, by estimation, is 60 to 65%. The left ventricle has normal function. The left ventricle has no regional wall motion abnormalities. Left ventricular diastolic parameters are consistent with Grade I diastolic dysfunction (impaired relaxation).  2. Right ventricular systolic function is normal. The right ventricular size is normal. There is normal pulmonary artery systolic pressure.  3. The mitral valve is normal in structure. Trivial mitral valve regurgitation. No evidence of mitral stenosis.  4. The aortic valve is normal in structure. Aortic valve regurgitation is not visualized. No aortic stenosis is present.  5. The inferior vena cava is normal in size with greater than 50% respiratory variability, suggesting right atrial pressure of 3 mmHg. Conclusion(s)/Recommendation(s): No  intracardiac source of embolism detected on this transthoracic study. A transesophageal echocardiogram is recommended to exclude cardiac source of embolism if clinically indicated. FINDINGS  Left Ventricle: Left ventricular ejection fraction, by estimation, is 60 to 65%. The left ventricle has normal function. The left ventricle has no regional wall motion abnormalities. The left ventricular internal cavity size was normal in size. There is  no left ventricular hypertrophy. Left ventricular diastolic parameters are consistent with Grade I diastolic dysfunction (impaired relaxation). Right Ventricle: The right ventricular size is normal. No increase in right ventricular wall thickness. Right ventricular systolic function is normal. There is normal pulmonary artery systolic pressure. The tricuspid regurgitant velocity is 2.43 m/s, and  with an assumed right atrial pressure of 3 mmHg, the estimated right ventricular systolic pressure is 123XX123 mmHg. Left Atrium: Left atrial size was normal in size. Right Atrium: Right atrial size was normal in size. Pericardium: There is no evidence of pericardial effusion. Mitral  Valve: The mitral valve is normal in structure. Normal mobility of the mitral valve leaflets. Trivial mitral valve regurgitation. No evidence of mitral valve stenosis. Tricuspid Valve: The tricuspid valve is normal in structure. Tricuspid valve regurgitation is mild . No evidence of tricuspid stenosis. Aortic Valve: The aortic valve is normal in structure. Aortic valve regurgitation is not visualized. No aortic stenosis is present. Pulmonic Valve: The pulmonic valve was normal in structure. Pulmonic valve regurgitation is not visualized. No evidence of pulmonic stenosis. Aorta: The aortic root is normal in size and structure. Venous: The inferior vena cava is normal in size with greater than 50% respiratory variability, suggesting right atrial pressure of 3 mmHg. IAS/Shunts: No atrial level shunt detected by  color flow Doppler.  LEFT VENTRICLE PLAX 2D LVIDd:         4.00 cm  Diastology LVIDs:         2.40 cm  LV e' lateral:   10.30 cm/s LV PW:         0.90 cm  LV E/e' lateral: 7.6 LV IVS:        0.90 cm  LV e' medial:    6.64 cm/s LVOT diam:     1.80 cm  LV E/e' medial:  11.7 LV SV:         62 LV SV Index:   35 LVOT Area:     2.54 cm  RIGHT VENTRICLE             IVC RV S prime:     11.50 cm/s  IVC diam: 1.50 cm TAPSE (M-mode): 2.1 cm LEFT ATRIUM             Index       RIGHT ATRIUM           Index LA diam:        3.80 cm 2.11 cm/m  RA Area:     10.90 cm LA Vol (A2C):   35.6 ml 19.79 ml/m RA Volume:   24.20 ml  13.45 ml/m LA Vol (A4C):   33.2 ml 18.45 ml/m LA Biplane Vol: 34.7 ml 19.29 ml/m  AORTIC VALVE LVOT Vmax:   97.90 cm/s LVOT Vmean:  65.900 cm/s LVOT VTI:    0.244 m  AORTA Ao Root diam: 2.60 cm MITRAL VALVE               TRICUSPID VALVE MV Area (PHT): 4.15 cm    TR Peak grad:   23.6 mmHg MV Decel Time: 183 msec    TR Vmax:        243.00 cm/s MV E velocity: 78.00 cm/s MV A velocity: 83.60 cm/s  SHUNTS MV E/A ratio:  0.93        Systemic VTI:  0.24 m                            Systemic Diam: 1.80 cm Candee Furbish MD Electronically signed by Candee Furbish MD Signature Date/Time: 09/15/2019/2:05:44 PM    Final    CT HEAD CODE STROKE WO CONTRAST  Result Date: 09/14/2019 CLINICAL DATA:  Code stroke. Acute neuro deficit. Left leg numbness. EXAM: CT HEAD WITHOUT CONTRAST TECHNIQUE: Contiguous axial images were obtained from the base of the skull through the vertex without intravenous contrast. COMPARISON:  MRI head 06/10/2015 FINDINGS: Brain: Hypodensity in the right anterior internal capsule compatible with infarct. This was not present on the prior MRI. This could be acute. Ventricle size  normal.  Negative for acute hemorrhage or mass. Vascular: Negative for hyperdense vessel Skull: Negative Sinuses/Orbits: Paranasal sinuses clear. Bilateral cataract extraction. Other: None ASPECTS ([place] Stroke Program Early CT  Score) - Ganglionic level infarction (caudate, lentiform nuclei, internal capsule, insula, M1-M3 cortex): 6 - Supraganglionic infarction (M4-M6 cortex): 3 Total score (0-10 with 10 being normal): 9 IMPRESSION: 1. Hypodensity anterior aspect of right internal capsule compatible with infarct. This was not present in 2017 and could be acute. No acute hemorrhage. 2. ASPECTS is 9 3. These results were called by telephone at the time of interpretation on 09/14/2019 at 3:17 pm to provider [facility] , who verbally acknowledged these results. Electronically Signed   By: Franchot Gallo M.D.   On: 09/14/2019 15:17    Microbiology: Recent Results (from the past 240 hour(s))  SARS CORONAVIRUS 2 (TAT 6-24 HRS) Nasopharyngeal Nasopharyngeal Swab     Status: None   Collection Time: 09/14/19  5:17 PM   Specimen: Nasopharyngeal Swab  Result Value Ref Range Status   SARS Coronavirus 2 NEGATIVE NEGATIVE Final    Comment: (NOTE) SARS-CoV-2 target nucleic acids are NOT DETECTED. The SARS-CoV-2 RNA is generally detectable in upper and lower respiratory specimens during the acute phase of infection. Negative results do not preclude SARS-CoV-2 infection, do not rule out co-infections with other pathogens, and should not be used as the sole basis for treatment or other patient management decisions. Negative results must be combined with clinical observations, patient history, and epidemiological information. The expected result is Negative. Fact Sheet for Patients: SugarRoll.be Fact Sheet for Healthcare Providers: https://www.woods-mathews.com/ This test is not yet approved or cleared by the [place] FDA and  has been authorized for detection and/or diagnosis of SARS-CoV-2 by FDA under an Emergency Use Authorization (EUA). This EUA will remain  in effect (meaning this test can be used) for the duration of the COVID-19 declaration under Section 56 4(b)(1) of the Act, 21  U.S.C. section 360bbb-3(b)(1), unless the authorization is terminated or revoked sooner. Performed at [facility], [place] [address] [place], [place] [address]      Labs: Basic Metabolic Panel: Recent Labs  Lab 09/14/19 1530 09/15/19 0512  NA 133*  --   K 4.1  --   CL 100  --   CO2 24  --   GLUCOSE 86  --   BUN 11  --   CREATININE 0.75 0.91  CALCIUM 9.3  --    Liver Function Tests: Recent Labs  Lab 09/14/19 1530  AST 24  ALT 20  ALKPHOS 96  BILITOT 0.5  PROT 6.7  ALBUMIN 4.3   No results for input(s): LIPASE, AMYLASE in the last 168 hours. No results for input(s): AMMONIA in the last 168 hours. CBC: Recent Labs  Lab 09/14/19 1530 09/15/19 0512  WBC 7.6 5.7  NEUTROABS 4.0  --   HGB 12.7 12.2  HCT 37.5 36.7  MCV 93.1 93.4  PLT 343 331   Cardiac Enzymes: No results for input(s): CKTOTAL, CKMB, CKMBINDEX, TROPONINI in the last 168 hours. BNP: BNP (last 3 results) No results for input(s): BNP in the last 8760 hours.  ProBNP (last 3 results) No results for input(s): PROBNP in the last 8760 hours.  CBG: No results for input(s): GLUCAP in the last 168 hours.     Signed:  Kayleen Memos, MD [facility] 09/15/2019, 4:37 PM

## 2019-09-15 NOTE — Discharge Instructions (Signed)
Transient Ischemic Attack ° °A transient ischemic attack (TIA) is a "warning stroke" that causes stroke-like symptoms that go away quickly. A TIA does not cause lasting damage to the brain. But having a TIA is a sign that you may be at risk for a stroke. Lifestyle changes and medical treatments can help prevent a stroke. °It is important to know the symptoms of a TIA and what to do. Get help right away, even if your symptoms go away. The symptoms of a TIA are the same as those of a stroke. They can happen fast, and they usually go away within minutes or hours. They can include: °· Weakness or loss of feeling in your face, arm, or leg. This often happens on one side of your body. °· Trouble walking. °· Trouble moving your arms or legs. °· Trouble talking or understanding what people are saying. °· Trouble seeing. °· Seeing two of one object (double vision). °· Feeling dizzy. °· Feeling confused. °· Loss of balance or coordination. °· Feeling sick to your stomach (nauseous) and throwing up (vomiting). °· A very bad headache for no reason. °What increases the risk? °Certain things may make you more likely to have a TIA. Some of these are things that you can change, such as: °· Being very overweight (obese). °· Using products that contain nicotine or tobacco, such as cigarettes and e-cigarettes. °· Taking birth control pills. °· Not being active. °· Drinking too much alcohol. °· Using drugs. °Other risk factors include: °· Having an irregular heartbeat (atrial fibrillation). °· Being African American or Hispanic. °· Having had blood clots, stroke, TIA, or heart attack in the past. °· Being a woman with a history of high blood pressure in pregnancy (preeclampsia). °· Being over the age of 60. °· Being female. °· Having family history of stroke. °· Having the following diseases or conditions: °? High blood pressure. °? High cholesterol. °? Diabetes. °? Heart disease. °? Sickle cell disease. °? Sleep apnea. °? Migraine  headache. °? Long-term (chronic) diseases that cause soreness and swelling (inflammation). °? Disorders that affect how your blood clots. °Follow these instructions at home: °Medicines ° °· Take over-the-counter and prescription medicines only as told by your doctor. °· If you were told to take aspirin or another medicine to thin your blood, take it exactly as told by your doctor. °? Taking too much of the medicine can cause bleeding. °? Taking too little of the medicine may not work to treat the problem. °Eating and drinking ° °· Eat 5 or more servings of fruits and vegetables each day. °· Follow instructions from your doctor about your diet. You may need to follow a certain diet to help lower your risk of having a stroke. You may need to: °? Eat a diet that is low in fat and salt. °? Eat foods that contain a lot of fiber. °? Limit the amount of carbohydrates and sugar in your diet. °· Limit alcohol intake to 1 drink a day for nonpregnant women and 2 drinks a day for men. One drink equals 12 oz of beer, 5 oz of wine, or 1½ oz of hard liquor. °General instructions °· Keep a healthy weight. °· Stay active. Try to get at least 30 minutes of activity on all or most days. °· Find out if you have a condition called sleep apnea. Get treatment if needed. °· Do not use any products that contain nicotine or tobacco, such as cigarettes and e-cigarettes. If you need help quitting,   ask your doctor.  Do not abuse drugs.  Keep all follow-up visits as told by your doctor. This is important. Get help right away if:  You have any signs of stroke. "BE FAST" is an easy way to remember the main warning signs: ? B - Balance. Signs are dizziness, sudden trouble walking, or loss of balance. ? E - Eyes. Signs are trouble seeing or a sudden change in how you see. ? F - Face. Signs are sudden weakness or loss of feeling of the face, or the face or eyelid drooping on one side. ? A - Arms. Signs are weakness or loss of feeling in an  arm. This happens suddenly and usually on one side of the body. ? S - Speech. Signs are sudden trouble speaking, slurred speech, or trouble understanding what people say. ? T - Time. Time to call emergency services. Write down what time symptoms started.  You have other signs of stroke, such as: ? A sudden, very bad headache with no known cause. ? Feeling sick to your stomach (nausea). ? Throwing up (vomiting). ? Jerky movements that you cannot control (seizure). These symptoms may be an emergency. Do not wait to see if the symptoms will go away. Get medical help right away. Call your local emergency services (911 in the [place] Do not drive yourself to the hospital. Summary  A transient ischemic attack (TIA) is a "warning stroke" that causes stroke-like symptoms that go away quickly.  A TIA is a medical emergency. Get help right away, even if your symptoms go away.  A TIA does not cause lasting damage to the brain.  Having a TIA is a sign that you may be at risk for a stroke. Lifestyle changes and medical treatments can help prevent a stroke. This information is not intended to replace advice given to you by your health care provider. Make sure you discuss any questions you have with your health care provider. Document Revised: 01/25/2018 Document Reviewed: 08/02/2016 Elsevier Patient Education  2020 [place] are being transferred to [facility] for MRI

## 2019-09-16 ENCOUNTER — Telehealth: Payer: Self-pay | Admitting: *Deleted

## 2019-09-16 NOTE — Telephone Encounter (Signed)
1st attempt. Unable to reach patient. LVM for pt to call office to schedule hospital follow up appointment.   

## 2019-09-17 ENCOUNTER — Other Ambulatory Visit: Payer: Self-pay

## 2019-09-17 ENCOUNTER — Ambulatory Visit: Payer: Medicare PPO | Admitting: Internal Medicine

## 2019-09-17 ENCOUNTER — Encounter: Payer: Self-pay | Admitting: Internal Medicine

## 2019-09-17 VITALS — BP 123/69 | HR 73 | Temp 97.2°F | Resp 18 | Ht 65.0 in | Wt 160.1 lb

## 2019-09-17 DIAGNOSIS — R110 Nausea: Secondary | ICD-10-CM

## 2019-09-17 DIAGNOSIS — G459 Transient cerebral ischemic attack, unspecified: Secondary | ICD-10-CM

## 2019-09-17 DIAGNOSIS — E785 Hyperlipidemia, unspecified: Secondary | ICD-10-CM | POA: Diagnosis not present

## 2019-09-17 NOTE — Patient Instructions (Signed)
Check the  blood pressure 2 or 3 times a week BP GOAL is between 110/65 and  135/85. If it is consistently higher or lower, let me know      GO TO THE FRONT DESK, PLEASE SCHEDULE YOUR APPOINTMENTS Come back for a check up in 2 months , fasting

## 2019-09-17 NOTE — Progress Notes (Signed)
Pre visit review using our clinic review tool, if applicable. No additional management support is needed unless otherwise documented below in the visit note. 

## 2019-09-17 NOTE — Progress Notes (Signed)
Subjective:    Patient ID: Cynthia [name], female    DOB: May 28, 1945, 74 y.o.   MRN: [id number]  DOS:  09/17/2019 Type of visit - description: Hospital follow-up  Admitted to the hospital 09/14/2019, discharged the next day. Presented with numbness at the left face, upper and lower extremities. Work-up reviewed.  Since she left the hospital no further symptoms. Has not started aspirin, Plavix or Zetia yet.  Review of Systems Reports no chest pain, occasionally has shortness of breath.  No palpitations  For the last 3 weeks admits to mild postprandial nausea sometimes, no associated abdominal pain, not taking NSAIDs. Has some heartburn but she takes pantoprazole and  typically symptoms are really mild. Denies blood in the stools. Had some diarrhea a couple of times in the last few weeks, better with OTCs.  No anxiety or depression at this point  Denies having dizziness, diplopia, slurred speech at any time.  Has a history of migraines, last episode was actually while she was in the hospital.  No headache today.  Past Medical History:  Diagnosis Date  . Allergy   . Anxiety   . Arthritis   . Blood transfusion without reported diagnosis   . Constipation, chronic   . DEGENERATIVE JOINT DISEASE 06/02/2010  . Depression   . Glaucoma 2014   Right eye  . Headache(784.0)   . Hyperlipidemia    hx of  . Osteopenia   . Psoriasis   . PUD (peptic ulcer disease) 2012   Upper GI bleed d/t NSAIDs  . RLS (restless legs syndrome)    robaxin prn    Past Surgical History:  Procedure Laterality Date  . ANKLE FRACTURE SURGERY Left    surgery 12-11 (LEFT)  . [place]    Allergies as of 09/17/2019      Reactions   Motrin [ibuprofen]    GI bleed   Pollen Extract    Statins    "delibitating pain all over body"   Sulfonamide Derivatives    Pt unsure of reaction   Ciprofloxacin Swelling, Rash      Medication List       Accurate as of Sep 17, 2019 11:59 PM. If you have  any questions, ask your nurse or doctor.        aspirin 81 MG EC tablet Take 1 tablet (81 mg total) by mouth daily.   buPROPion 300 MG 24 hr tablet Commonly known as: WELLBUTRIN XL Take 1 tablet (300 mg total) by mouth daily.   cetirizine 10 MG tablet Commonly known as: ZYRTEC Take 10 mg by mouth at bedtime as needed for allergies.   clopidogrel 75 MG tablet Commonly known as: PLAVIX Take 1 tablet (75 mg total) by mouth daily for 21 days.   ezetimibe 10 MG tablet Commonly known as: ZETIA Take 1 tablet (10 mg total) by mouth daily.   methocarbamol 500 MG tablet Commonly known as: ROBAXIN Take 1 tablet (500 mg total) by mouth 3 (three) times daily as needed. What changed: reasons to take this   nystatin 100000 UNIT/ML suspension Commonly known as: MYCOSTATIN Take 5 mLs (500,000 Units total) by mouth 4 (four) times daily.   pantoprazole 40 MG tablet Commonly known as: PROTONIX Take 1 tablet (40 mg total) by mouth daily. What changed:   when to take this  reasons to take this   sertraline 100 MG tablet Commonly known as: ZOLOFT Take 1 tablet (100 mg total) by mouth daily. What changed: when  to take this   traMADol 50 MG tablet Commonly known as: ULTRAM Take 1 tablet (50 mg total) by mouth every 6 (six) hours as needed.   Travoprost (BAK Free) 0.004 % Soln ophthalmic solution Commonly known as: TRAVATAN Place 1 drop into the right eye at bedtime.   valACYclovir 1000 MG tablet Commonly known as: Valtrex Take 1 tablet (1,000 mg total) by mouth 3 (three) times daily.   verapamil 180 MG CR tablet Commonly known as: CALAN-SR Take 1 tablet (180 mg total) by mouth at bedtime.   zolpidem 5 MG tablet Commonly known as: AMBIEN Take 1 tablet (5 mg total) by mouth at bedtime as needed for sleep.          Objective:   Physical Exam BP 123/69 (BP Location: Left Arm, Patient Position: Sitting, Cuff Size: Small)   Pulse 73   Temp (!) 97.2 F (36.2 C) (Temporal)    Resp 18   Ht 5\' 5"  (1.651 m)   Wt 160 lb 2 oz (72.6 kg)   SpO2 98%   BMI 26.65 kg/m  General:   Well developed, NAD, BMI noted. HEENT:  Normocephalic . Face symmetric, atraumatic Lungs:  CTA B Normal respiratory effort, no intercostal retractions, no accessory muscle use. Heart: RRR,  no murmur Abdomen: Soft, no TTP.  Lower extremities: no pretibial edema bilaterally  Skin: Not pale. Not jaundice Neurologic:  alert & oriented X3.  Speech normal, gait appropriate for age and unassisted. Motor symmetric, DTR symmetric except for decrease left ankle jerk. Psych--  Cognition and judgment appear intact.  Cooperative with normal attention span and concentration.  Behavior appropriate. No anxious or depressed appearing.      Assessment      Assessment Hyperlipidemia: Lipitor causes myalgias, declined other statins, zetia didn't help Anxiety depression insomnia DJD Headaches Osteopenia: 07-2014 T score of -1.7, Rx   vitamin D PUD due to NSAIDs RLS - robaxin  prn Glaucoma Chronic constipation Skin psoriasis , Dx LSC (lichen )  ~ [id number] , sees derm    PLAN: TIA: Admitted to the hospital with left-sided numbness, symptoms of resolved, work-up included: 2D echo normal with grade 1 diastolic dysfunction.  No PFO or thrombus. MRA head no acute, 2 mm anterior communicating artery aneurysm. MRA neck normal LDL 162.  Sodium 133, slightly low.    Neurology notes: TIA versus complicated migraine, recommended aspirin and Plavix for 3 weeks, then aspirin. No further symptoms. She has not started aspirin, Plavix or Zetia. Patient educated, she is at high risk of had a stroke given recent TIA, recommend to start medications today. Has a follow-up with neurology soon 2 mm anterior communicating artery aneurysm: Per head MRA, recommend to discuss w/ neurology Hyperlipidemia: Last LDL 162, again recommend to start Zetia.  Due to severe side effects to atorvastatin she will be very  reluctant to try any other statin.  Reassess when she comes back Postprandial nausea: Started 3 weeks ago, as described above, not on NSAIDs, no blood in the stools, no abdominal pain.  Observation for now, consider further eval. RTC 2 months   This visit occurred during the SARS-CoV-2 public health emergency.  Safety protocols were in place, including screening questions prior to the visit, additional usage of staff PPE, and extensive cleaning of exam room while observing appropriate contact time as indicated for disinfecting solutions.

## 2019-09-18 NOTE — Assessment & Plan Note (Signed)
TIA: Admitted to the hospital with left-sided numbness, symptoms of resolved, work-up included: 2D echo normal with grade 1 diastolic dysfunction.  No PFO or thrombus. MRA head no acute, 2 mm anterior communicating artery aneurysm. MRA neck normal LDL 162.  Sodium 133, slightly low.    Neurology notes: TIA versus complicated migraine, recommended aspirin and Plavix for 3 weeks, then aspirin. No further symptoms. She has not started aspirin, Plavix or Zetia. Patient educated, she is at high risk of had a stroke given recent TIA, recommend to start medications today. Has a follow-up with neurology soon 2 mm anterior communicating artery aneurysm: Per head MRA, recommend to discuss w/ neurology Hyperlipidemia: Last LDL 162, again recommend to start Zetia.  Due to severe side effects to atorvastatin she will be very reluctant to try any other statin.  Reassess when she comes back Postprandial nausea: Started 3 weeks ago, as described above, not on NSAIDs, no blood in the stools, no abdominal pain.  Observation for now, consider further eval. RTC 2 months

## 2019-09-22 NOTE — Progress Notes (Signed)
Virtual Visit via Video Note The purpose of this virtual visit is to provide medical care while limiting exposure to the novel coronavirus.    Consent was obtained for video visit:  Yes.   Answered questions that patient had about telehealth interaction:  Yes.   I discussed the limitations, risks, security and privacy concerns of performing an evaluation and management service by telemedicine. I also discussed with the patient that there may be a patient responsible charge related to this service. The patient expressed understanding and agreed to proceed.  Pt location: Home Physician Location: office Name of referring provider:  Rosalin Hawking, MD I connected with Cynthia [name] at patients initiation/request on 09/23/2019 at 10:10 AM EDT by video enabled telemedicine application and verified that I am speaking with the correct person using two identifiers. Pt MRN:  [id number] Pt DOB:  [birth year] Video Participants:  Cynthia [name];   History of Present Illness:  Irania [name] is a 74 year old right-handed woman with depression, glaucoma and degenerative joint disease who follows up for recent TIA as well as headaches.  UPDATE: She was admitted to [facility] on 09/14/2019 for TIA presenting as transient left sided hemibody numbness (first leg, then arm, then face).  Mostly resolved quickly but lingered for several hours.  CT head showed right internal capsule hypodensity but follow up MRI of brain showed no acute infarct.  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  2D echo showed EF 60-65% with no source of embolus.  LDL was 162 and Hgb A1c was 5.2.  She was started on ASA 81mg  daily and Plavix 75mg  for 3 weeks followed by ASA alone.  The next morning she had a habitual migraine headache except it lasted all day.  She was also started on Zetia (she has statin intolerance).  Unclear if event was TIA or complicated migraine.  Current NSAIDS:ASA 81mg  daily Current  analgesics:tramadol; acetaminophen-caffeine Current triptans:none Current ergotamine:no Current anti-emetic:Zofran Current muscle relaxants:Robaxin 500mg TID Current anti-anxiolytic:no Current sleep aide:Ambien Current Antihypertensive medications:verapamil CR 180mg  Current Antidepressant medications:Sertraline100mg , bupropion300mg  Current Anticonvulsant medications:no Current anti-CGRP:no Current Vitamins/Herbal/Supplements:B complex, Mg Current Antihistamines/Decongestants:Mucinex, Astelin Other therapy:1 cup black coffee with melatonin 10mg  at bedtime Other medication:  Plavix; Zetia  Depression and anxiety: Yes Other pain: Joint pain  HISTORY: She started having headaches infrequently in 2015but progressively became more frequent in 2016.Varies, but they are usually left sided, involving the ear, but also may be right sided (involving the sinuses) or in band-like distribution.It is both throbbing and non-throbbing.Initially, it is usually 5/10 but severe episodes are 7-8/10.Sometimes there is nausea.There is phonophobia.There is noassociatedphotophobia,visual disturbanceor unilateral numbness or weakness.Initially, it lasts a couple of hours and occuring 15 days out of the month (3 to 4 days severe).It wakes her up at 4:30 to 5 AM.She reports sensation of congestion but no runny nose.CT of sinuses showed opacified right sphenoid sinus and ethmoid air cell.She was treated a couple of times with Augmentin and prednisone, which was effective for a while but headaches then returned.She cannot think of a trigger. They are worse during the summer.There are no aggravating or relieving factors.MRI of brain from 06/10/15 was normal. Sed Rate from 12/27/15 was 10.  Past NSAIDS:ibuprofen (ulcers) Past analgesics:Tylenol Past abortive triptans:sumatriptan (advised to discontinue due to age) Past abortive ergotamine:no Past  muscle relaxants:no Past anti-emetic:no Past antihypertensive medications:no Past antidepressant medications:Nortriptyline 50mg  Past anticonvulsant medications:topiramate 25mg  (concerned about glaucoma) Past anti-CGRP:no Past vitamins/Herbal/Supplements:no Past antihistamines/decongestants:no Other past therapies:none  PAST MEDICAL HISTORY: Past Medical History:  Diagnosis Date  . Allergy   . Anxiety   . Arthritis   . Blood transfusion without reported diagnosis   . Constipation, chronic   . DEGENERATIVE JOINT DISEASE 06/02/2010  . Depression   . Glaucoma 2014   Right eye  . Headache(784.0)   . Hyperlipidemia    hx of  . Osteopenia   . Psoriasis   . PUD (peptic ulcer disease) 2012   Upper GI bleed d/t NSAIDs  . RLS (restless legs syndrome)    robaxin prn    MEDICATIONS: Current Outpatient Medications on File Prior to Visit  Medication Sig Dispense Refill  . aspirin EC 81 MG EC tablet Take 1 tablet (81 mg total) by mouth daily. 120 tablet 0  . buPROPion (WELLBUTRIN XL) 300 MG 24 hr tablet Take 1 tablet (300 mg total) by mouth daily. 90 tablet 1  . cetirizine (ZYRTEC) 10 MG tablet Take 10 mg by mouth at bedtime as needed for allergies.     Marland Kitchen clopidogrel (PLAVIX) 75 MG tablet Take 1 tablet (75 mg total) by mouth daily for 21 days. 21 tablet 0  . ezetimibe (ZETIA) 10 MG tablet Take 1 tablet (10 mg total) by mouth daily. (Patient not taking: Reported on 09/17/2019) 60 tablet 0  . methocarbamol (ROBAXIN) 500 MG tablet Take 1 tablet (500 mg total) by mouth 3 (three) times daily as needed. (Patient taking differently: Take 500 mg by mouth 3 (three) times daily as needed for muscle spasms. ) 90 tablet 1  . nystatin (MYCOSTATIN) 100000 UNIT/ML suspension Take 5 mLs (500,000 Units total) by mouth 4 (four) times daily. (Patient not taking: Reported on 09/17/2019) 60 mL 0  . pantoprazole (PROTONIX) 40 MG tablet Take 1 tablet (40 mg total) by mouth daily. (Patient taking  differently: Take 40 mg by mouth daily as needed (for acid reflux). ) 30 tablet 3  . sertraline (ZOLOFT) 100 MG tablet Take 1 tablet (100 mg total) by mouth daily. (Patient taking differently: Take 100 mg by mouth at bedtime. ) 90 tablet 3  . traMADol (ULTRAM) 50 MG tablet Take 1 tablet (50 mg total) by mouth every 6 (six) hours as needed. (Patient not taking: Reported on 09/14/2019) 20 tablet 0  . Travoprost, BAK Free, (TRAVATAN) 0.004 % SOLN ophthalmic solution Place 1 drop into the right eye at bedtime.    . valACYclovir (VALTREX) 1000 MG tablet Take 1 tablet (1,000 mg total) by mouth 3 (three) times daily. 30 tablet 0  . verapamil (CALAN-SR) 180 MG CR tablet Take 1 tablet (180 mg total) by mouth at bedtime. 30 tablet 5  . zolpidem (AMBIEN) 5 MG tablet Take 1 tablet (5 mg total) by mouth at bedtime as needed for sleep. 30 tablet 3   No current facility-administered medications on file prior to visit.    ALLERGIES: Allergies  Allergen Reactions  . Motrin [Ibuprofen]     GI bleed  . Pollen Extract   . Statins     "delibitating pain all over body"  . Sulfonamide Derivatives     Pt unsure of reaction  . Ciprofloxacin Swelling and Rash    FAMILY HISTORY: Family History  Problem Relation Age of Onset  . Aneurysm Mother        brain  . Celiac disease Brother   . Diabetes Maternal Grandfather   . Hypertension Neg Hx   . Breast cancer Neg Hx   . Coronary artery disease Neg Hx   .  Colon cancer Neg Hx   . Esophageal cancer Neg Hx   . Stomach cancer Neg Hx   . Rectal cancer Neg Hx    SOCIAL HISTORY: Social History   Socioeconomic History  . Marital status: Widowed    Spouse name: Not on file  . Number of children: 1  . Years of education: Not on file  . Highest education level: Doctorate  Occupational History  . Occupation: retired Environmental consultant: RETIRED  Tobacco Use  . Smoking status: Never Smoker  . Smokeless tobacco: Never Used  Substance and Sexual  Activity  . Alcohol use: Yes    Comment: socially  . Drug use: No  . Sexual activity: Not on file  Other Topics Concern  . Not on file  Social History Narrative   2 step children, 1 child , [place]- patient and her husband, he recently stop driving        Lives in two story home      Pt is right handed   Social Determinants of Health   Financial Resource Strain:   . Difficulty of Paying Living Expenses:   Food Insecurity:   . Worried About Charity fundraiser in the Last Year:   . Arboriculturist in the Last Year:   Transportation Needs:   . Film/video editor (Medical):   Marland Kitchen Lack of Transportation (Non-Medical):   Physical Activity:   . Days of Exercise per Week:   . Minutes of Exercise per Session:   Stress:   . Feeling of Stress :   Social Connections:   . Frequency of Communication with Friends and Family:   . Frequency of Social Gatherings with Friends and Family:   . Attends Religious Services:   . Active Member of Clubs or Organizations:   . Attends Archivist Meetings:   Marland Kitchen Marital Status:   Intimate Partner Violence:   . Fear of Current or Ex-Partner:   . Emotionally Abused:   Marland Kitchen Physically Abused:   . Sexually Abused:     PHYSICAL EXAM: There were no vitals taken for this visit. General: No acute distress.  Patient appears well-groomed.    Neurological Exam: alert and oriented to person, place, and time. Attention span and concentration intact, recent and remote memory intact, fund of knowledge intact.  Speech fluent and not dysarthric, language intact.  CN II-XII intact. Bulk and tone normal, muscle strength 5/5 throughout.  Sensation to light touch, temperature and vibration intact.  Deep tendon reflexes 2+ throughout, toes downgoing.  Finger to nose and heel to shin testing intact.  Gait normal, Romberg negative.  IMPRESSION: 1.  Transient left hemibody numbness:  Likely transient ischemic attack.  If she should have another habitual  spell, then likely migraine 2.  Migraine 3.  Small ACom aneurysm.   PLAN: 1.  Continue ASA 81mg  and Plavix 75mg  daily until 21 days up, followed by ASA 81mg  daily alone for secondary stroke prevention 2.  Zetia (LDL goal less than 70), blood pressure control 3.  For migraine abortive therapy, tramadol.  Limit use of pain relievers to no more than 2 days out of week to prevent risk of rebound or medication-overuse headache. 4.  Would repeat MRA of head in one year to follow up on incidental small aneurysm.  5.  Follow up on 8/30 as scheduled.   Follow Up Instructions:    -I discussed the assessment and treatment  plan with the patient. The patient was provided an opportunity to ask questions and all were answered. The patient agreed with the plan and demonstrated an understanding of the instructions.   The patient was advised to call back or seek an in-person evaluation if the symptoms worsen or if the condition fails to improve as anticipated.   Dudley Major, DO

## 2019-09-23 ENCOUNTER — Encounter: Payer: Self-pay | Admitting: Neurology

## 2019-09-23 ENCOUNTER — Other Ambulatory Visit: Payer: Self-pay

## 2019-09-23 ENCOUNTER — Telehealth (INDEPENDENT_AMBULATORY_CARE_PROVIDER_SITE_OTHER): Payer: Medicare PPO | Admitting: Neurology

## 2019-09-23 DIAGNOSIS — I671 Cerebral aneurysm, nonruptured: Secondary | ICD-10-CM | POA: Diagnosis not present

## 2019-09-23 DIAGNOSIS — G43009 Migraine without aura, not intractable, without status migrainosus: Secondary | ICD-10-CM | POA: Diagnosis not present

## 2019-09-23 DIAGNOSIS — G459 Transient cerebral ischemic attack, unspecified: Secondary | ICD-10-CM

## 2019-09-25 ENCOUNTER — Other Ambulatory Visit: Payer: Self-pay | Admitting: Neurology

## 2019-10-28 ENCOUNTER — Other Ambulatory Visit: Payer: Self-pay | Admitting: Neurology

## 2019-10-28 ENCOUNTER — Telehealth: Payer: Self-pay

## 2019-10-28 ENCOUNTER — Other Ambulatory Visit: Payer: Self-pay

## 2019-10-28 MED ORDER — TRAMADOL HCL 50 MG PO TABS: 50.0000 mg | ORAL_TABLET | Freq: Four times a day (QID) | ORAL | 2 refills | Status: DC | PRN

## 2019-10-28 NOTE — Telephone Encounter (Signed)
Refilled

## 2019-10-28 NOTE — Telephone Encounter (Signed)
Refill request rec'd from [facility]. Pt needs a refill on  Tramadol 50 mg tab

## 2019-11-06 ENCOUNTER — Telehealth: Payer: Self-pay | Admitting: Internal Medicine

## 2019-11-07 NOTE — Telephone Encounter (Signed)
PDMP okay, on tramadol prescribed by neurology, for migraines.  Takes it sporadically. Rx sent

## 2019-11-07 NOTE — Telephone Encounter (Signed)
Ambien refill.   Last OV: 09/17/2019 Last Fill: 07/04/2019 #30 and 3RF Pt sig: 1 tab qhs prn UDS: Ambien only

## 2019-11-19 ENCOUNTER — Ambulatory Visit: Payer: Medicare PPO | Admitting: Internal Medicine

## 2019-11-26 ENCOUNTER — Other Ambulatory Visit: Payer: Self-pay

## 2019-11-26 ENCOUNTER — Encounter: Payer: Self-pay | Admitting: Internal Medicine

## 2019-11-26 ENCOUNTER — Ambulatory Visit: Payer: Medicare PPO | Admitting: Internal Medicine

## 2019-11-26 VITALS — BP 119/76 | HR 71 | Temp 97.8°F | Resp 18 | Ht 65.0 in | Wt 160.2 lb

## 2019-11-26 DIAGNOSIS — M79605 Pain in left leg: Secondary | ICD-10-CM

## 2019-11-26 DIAGNOSIS — M79604 Pain in right leg: Secondary | ICD-10-CM | POA: Diagnosis not present

## 2019-11-26 DIAGNOSIS — M545 Low back pain, unspecified: Secondary | ICD-10-CM

## 2019-11-26 MED ORDER — EZETIMIBE 10 MG PO TABS: 10.0000 mg | ORAL_TABLET | Freq: Every day | ORAL | 3 refills | Status: DC

## 2019-11-26 NOTE — Progress Notes (Signed)
Pre visit review using our clinic review tool, if applicable. No additional management support is needed unless otherwise documented below in the visit note. 

## 2019-11-26 NOTE — Patient Instructions (Addendum)
Please schedule Medicare Wellness with Glenard Haring.   We will refer you to physical therapy  For back pain control:  Tylenol  500 mg OTC 2 tabs a day every 8 hours as needed for pain  Tramadol as needed Ice or heating pad   GO TO THE FRONT DESK, PLEASE SCHEDULE YOUR APPOINTMENTS Come back for a check up physical exam in 3 months

## 2019-11-26 NOTE — Progress Notes (Signed)
Subjective:    Patient ID: Cynthia [name], female    DOB: 1946-02-17, 74 y.o.   MRN: [id number]  DOS:  11/26/2019 Type of visit - description: Follow-up. Discussed several issues History of TIA, neurology note reviewed. She reports back pain, lately is getting worse, sometimes severe. It is associated with bilateral leg achiness from the hips down. The achiness of the legs is not worse by going up or down the stairs but rather with certain positions when she sits or lay down at night. No claudication.  Review of Systems Denies fever chills She has chronic headaches and they are actually better than baseline Has lost some weight but has changed her diet habits.   Past Medical History:  Diagnosis Date  . Allergy   . Anxiety   . Arthritis   . Blood transfusion without reported diagnosis   . Constipation, chronic   . DEGENERATIVE JOINT DISEASE 06/02/2010  . Depression   . Glaucoma 2014   Right eye  . Headache(784.0)   . Hyperlipidemia    hx of  . Osteopenia   . Psoriasis   . PUD (peptic ulcer disease) 2012   Upper GI bleed d/t NSAIDs  . RLS (restless legs syndrome)    robaxin prn    Past Surgical History:  Procedure Laterality Date  . ANKLE FRACTURE SURGERY Left    surgery 12-11 (LEFT)  . [place]    Allergies as of 11/26/2019      Reactions   Motrin [ibuprofen]    GI bleed   Pollen Extract    Statins    "delibitating pain all over body"   Sulfonamide Derivatives    Pt unsure of reaction   Ciprofloxacin Swelling, Rash      Medication List       Accurate as of November 26, 2019  8:54 AM. If you have any questions, ask your nurse or doctor.        aspirin 81 MG EC tablet Take 1 tablet (81 mg total) by mouth daily.   buPROPion 300 MG 24 hr tablet Commonly known as: WELLBUTRIN XL Take 1 tablet (300 mg total) by mouth daily.   cetirizine 10 MG tablet Commonly known as: ZYRTEC Take 10 mg by mouth at bedtime as needed for allergies.    ezetimibe 10 MG tablet Commonly known as: ZETIA Take 1 tablet (10 mg total) by mouth daily.   methocarbamol 500 MG tablet Commonly known as: ROBAXIN Take 1 tablet (500 mg total) by mouth 3 (three) times daily as needed. What changed: reasons to take this   nystatin 100000 UNIT/ML suspension Commonly known as: MYCOSTATIN Take 5 mLs (500,000 Units total) by mouth 4 (four) times daily.   pantoprazole 40 MG tablet Commonly known as: PROTONIX Take 1 tablet (40 mg total) by mouth daily.   sertraline 100 MG tablet Commonly known as: ZOLOFT Take 1 tablet (100 mg total) by mouth daily. What changed: when to take this   traMADol 50 MG tablet Commonly known as: ULTRAM Take 1 tablet (50 mg total) by mouth every 6 (six) hours as needed.   Travoprost (BAK Free) 0.004 % Soln ophthalmic solution Commonly known as: TRAVATAN Place 1 drop into the right eye at bedtime.   valACYclovir 1000 MG tablet Commonly known as: Valtrex Take 1 tablet (1,000 mg total) by mouth 3 (three) times daily.   verapamil 180 MG CR tablet Commonly known as: CALAN-SR TAKE ONE TABLET BY MOUTH EVERY NIGHT AT BEDTIME  zolpidem 5 MG tablet Commonly known as: AMBIEN TAKE ONE TABLET BY MOUTH EVERY NIGHT AT BEDTIME AS NEEDED FOR SLEEP          Objective:   Physical Exam BP 119/76 (BP Location: Left Arm, Patient Position: Sitting, Cuff Size: Small)   Pulse 71   Temp 97.8 F (36.6 C) (Oral)   Resp 18   Ht 5\' 5"  (1.651 m)   Wt 160 lb 4 oz (72.7 kg)   SpO2 98%   BMI 26.67 kg/m  General:   Well developed, NAD, BMI noted. HEENT:  Normocephalic . Face symmetric, atraumatic MSK: Slightly TTP at the right SI joint, lumbar spine but not TTP @ left SI joint Slightly TTP at the left trochanteric bursa but not at the right one. Lower extremities: no pretibial edema bilaterally.  Good pedal pulses Skin: Not pale. Not jaundice Neurologic:  alert & oriented X3.  Speech normal, gait appropriate for age and  unassisted Motor symmetric, DTR symmetric. Able to walk on her toes but has some difficulty balancing when she walk on her heels. Psych--  Cognition and judgment appear intact.  Cooperative with normal attention span and concentration.  Behavior appropriate. No anxious or depressed appearing.      Assessment     Assessment Hyperlipidemia: Lipitor causes myalgias, declined other statins, zetia didn't help Anxiety depression insomnia DJD Headaches Osteopenia: 07-2014 T score of -1.7, Rx   vitamin D PUD due to NSAIDs RLS - robaxin  prn Glaucoma Chronic constipation Skin psoriasis , Dx LSC (lichen )  ~ 1505 , sees derm   PLAN: Hyperlipidemia: Ran out of Zetia, RF sent, check cholesterol panel at the next visit MSK: Has pain on the knees (DJD) at baseline, uses occasional tramadol. Also has back pain and bilateral lower extremity achiness which is actually positional.  SX likely related to back DJD, perhaps spinal stenosis. We talk about further evaluation versus treatment with physical therapy, Tylenol, tramadol.  We agreed on conservative treatment, PT referral sent. Tinnitus: Report tinnitus, we will see ENT soon Anterior communicating aneurysm: Per neurology, recheck MRA 1 year Postprandial nausea: See last visit, resolved TIA: No further symptoms, plan is to control CV RF RTC 3 months CPX    This visit occurred during the SARS-CoV-2 public health emergency.  Safety protocols were in place, including screening questions prior to the visit, additional usage of staff PPE, and extensive cleaning of exam room while observing appropriate contact time as indicated for disinfecting solutions.

## 2019-11-27 NOTE — Assessment & Plan Note (Signed)
Hyperlipidemia: Ran out of Zetia, RF sent, check cholesterol panel at the next visit MSK: Has pain on the knees (DJD) at baseline, uses occasional tramadol. Also has back pain and bilateral lower extremity achiness which is actually positional.  SX likely related to back DJD, perhaps spinal stenosis. We talk about further evaluation versus treatment with physical therapy, Tylenol, tramadol.  We agreed on conservative treatment, PT referral sent. Tinnitus: Report tinnitus, we will see ENT soon Anterior communicating aneurysm: Per neurology, recheck MRA 1 year Postprandial nausea: See last visit, resolved TIA: No further symptoms, plan is to control CV RF RTC 3 months CPX

## 2019-12-02 ENCOUNTER — Encounter (INDEPENDENT_AMBULATORY_CARE_PROVIDER_SITE_OTHER): Payer: Self-pay | Admitting: Otolaryngology

## 2019-12-02 ENCOUNTER — Ambulatory Visit (INDEPENDENT_AMBULATORY_CARE_PROVIDER_SITE_OTHER): Payer: Medicare PPO | Admitting: Otolaryngology

## 2019-12-02 ENCOUNTER — Other Ambulatory Visit: Payer: Self-pay

## 2019-12-02 VITALS — Temp 97.7°F

## 2019-12-02 DIAGNOSIS — M25552 Pain in left hip: Secondary | ICD-10-CM | POA: Diagnosis not present

## 2019-12-02 DIAGNOSIS — R262 Difficulty in walking, not elsewhere classified: Secondary | ICD-10-CM | POA: Diagnosis not present

## 2019-12-02 DIAGNOSIS — M25551 Pain in right hip: Secondary | ICD-10-CM | POA: Diagnosis not present

## 2019-12-02 DIAGNOSIS — H9313 Tinnitus, bilateral: Secondary | ICD-10-CM | POA: Diagnosis not present

## 2019-12-02 DIAGNOSIS — M6281 Muscle weakness (generalized): Secondary | ICD-10-CM | POA: Diagnosis not present

## 2019-12-02 DIAGNOSIS — H903 Sensorineural hearing loss, bilateral: Secondary | ICD-10-CM | POA: Diagnosis not present

## 2019-12-02 DIAGNOSIS — M545 Low back pain: Secondary | ICD-10-CM | POA: Diagnosis not present

## 2019-12-02 DIAGNOSIS — M799 Soft tissue disorder, unspecified: Secondary | ICD-10-CM | POA: Diagnosis not present

## 2019-12-02 NOTE — Progress Notes (Signed)
HPI: Cynthia [name] is a 74 y.o. female who presents is referred by by her PCP for evaluation of tinnitus in both ears that started this past May following a TIA.  She has not noted any hearing problems.  Denies loud noise exposure.  She describes constant "insect "sound in both ears..  Past Medical History:  Diagnosis Date   Allergy    Anxiety    Arthritis    Blood transfusion without reported diagnosis    Constipation, chronic    DEGENERATIVE JOINT DISEASE 06/02/2010   Depression    Glaucoma 2014   Right eye   Headache(784.0)    Hyperlipidemia    hx of   Osteopenia    Psoriasis    PUD (peptic ulcer disease) 2012   Upper GI bleed d/t NSAIDs   RLS (restless legs syndrome)    robaxin prn   Past Surgical History:  Procedure Laterality Date   ANKLE FRACTURE SURGERY Left    surgery 12-11 (LEFT)   CESAREAN SECTION  1969   Social History   Socioeconomic History   Marital status: Widowed    Spouse name: Not on file   Number of children: 1   Years of education: Not on file   Highest education level: Doctorate  Occupational History   Occupation: retired VP of Tax inspector: RETIRED  Tobacco Use   Smoking status: Never Smoker   Smokeless tobacco: Never Used  Scientific laboratory technician Use: Never used  Substance and Sexual Activity   Alcohol use: Yes    Comment: socially   Drug use: No   Sexual activity: Not on file  Other Topics Concern   Not on file  Social History Narrative   2 step children, 1 child , [place]- patient and her husband, he recently stop driving        Lives in two story home      Pt is right handed   Social Determinants of Radio broadcast assistant Strain:    Difficulty of Paying Living Expenses:   Food Insecurity:    Worried About Charity fundraiser in the Last Year:    Arboriculturist in the Last Year:   Transportation Needs:    Film/video editor (Medical):    Lack of Transportation  (Non-Medical):   Physical Activity:    Days of Exercise per Week:    Minutes of Exercise per Session:   Stress:    Feeling of Stress :   Social Connections:    Frequency of Communication with Friends and Family:    Frequency of Social Gatherings with Friends and Family:    Attends Religious Services:    Active Member of Clubs or Organizations:    Attends Music therapist:    Marital Status:    Family History  Problem Relation Age of Onset   Aneurysm Mother        brain   Celiac disease Brother    Diabetes Maternal Grandfather    Hypertension Neg Hx    Breast cancer Neg Hx    Coronary artery disease Neg Hx    Colon cancer Neg Hx    Esophageal cancer Neg Hx    Stomach cancer Neg Hx    Rectal cancer Neg Hx    Allergies  Allergen Reactions   Motrin [Ibuprofen]     GI bleed   Pollen Extract    Statins     "  delibitating pain all over body"   Sulfonamide Derivatives     Pt unsure of reaction   Ciprofloxacin Swelling and Rash   Prior to Admission medications   Medication Sig Start Date End Date Taking? Authorizing Provider  aspirin EC 81 MG EC tablet Take 1 tablet (81 mg total) by mouth daily. 09/16/19 01/14/20 Yes Hall, Carole N, DO  buPROPion (WELLBUTRIN XL) 300 MG 24 hr tablet Take 1 tablet (300 mg total) by mouth daily. 08/25/19  Yes Paz, Alda Berthold, MD  cetirizine (ZYRTEC) 10 MG tablet Take 10 mg by mouth at bedtime as needed for allergies.    Yes [provider]  ezetimibe (ZETIA) 10 MG tablet Take 1 tablet (10 mg total) by mouth daily. 11/26/19  Yes Paz, Alda Berthold, MD  methocarbamol (ROBAXIN) 500 MG tablet Take 1 tablet (500 mg total) by mouth 3 (three) times daily as needed. Patient taking differently: Take 500 mg by mouth 3 (three) times daily as needed for muscle spasms.  09/02/19  Yes Paz, Alda Berthold, MD  pantoprazole (PROTONIX) 40 MG tablet Take 1 tablet (40 mg total) by mouth daily. 05/01/19  Yes Paz, Alda Berthold, MD  sertraline (ZOLOFT) 100  MG tablet Take 1 tablet (100 mg total) by mouth daily. Patient taking differently: Take 100 mg by mouth at bedtime.  07/28/19  Yes Paz, Alda Berthold, MD  traMADol (ULTRAM) 50 MG tablet Take 1 tablet (50 mg total) by mouth every 6 (six) hours as needed. 10/28/19  Yes Jaffe, Adam R, DO  Travoprost, BAK Free, (TRAVATAN) 0.004 % SOLN ophthalmic solution Place 1 drop into the right eye at bedtime.   Yes [provider]  valACYclovir (VALTREX) 1000 MG tablet Take 1 tablet (1,000 mg total) by mouth 3 (three) times daily. 09/05/19  Yes Roma Schanz R, DO  verapamil (CALAN-SR) 180 MG CR tablet TAKE ONE TABLET BY MOUTH EVERY NIGHT AT BEDTIME 09/25/19  Yes Jaffe, Adam R, DO  zolpidem (AMBIEN) 5 MG tablet TAKE ONE TABLET BY MOUTH EVERY NIGHT AT BEDTIME AS NEEDED FOR SLEEP 11/07/19  Yes Colon Branch, MD     Positive ROS: Otherwise negative  All other systems have been reviewed and were otherwise negative with the exception of those mentioned in the HPI and as above.  Physical Exam: Constitutional: Alert, well-appearing, no acute distress Ears: External ears without lesions or tenderness. Ear canals are clear bilaterally with intact, clear TMs bilaterally. Nasal: External nose without lesions Clear nasal passages Oral: Lips and gums without lesions. Tongue and palate mucosa without lesions. Posterior oropharynx clear. Neck: No palpable adenopathy or masses Respiratory: Breathing comfortably  Skin: No facial/neck lesions or rash noted.  Audiogram in the office today demonstrated mild downsloping sensorineural hearing loss in both ears above 2000 frequency consistent with presbycusis.  She has type A tympanograms bilaterally.  SRT's were 15 dB bilaterally.  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  Plan: Discussed with her concerning using masking noise to help cope with the  tinnitus.  Also recommended using ear protection when around loud noise.  Otherwise there is not great treatment for tinnitus and reviewed this with her.   Radene Journey, MD   CC:

## 2019-12-03 DIAGNOSIS — M25551 Pain in right hip: Secondary | ICD-10-CM | POA: Diagnosis not present

## 2019-12-03 DIAGNOSIS — R262 Difficulty in walking, not elsewhere classified: Secondary | ICD-10-CM | POA: Diagnosis not present

## 2019-12-03 DIAGNOSIS — M799 Soft tissue disorder, unspecified: Secondary | ICD-10-CM | POA: Diagnosis not present

## 2019-12-03 DIAGNOSIS — M545 Low back pain: Secondary | ICD-10-CM | POA: Diagnosis not present

## 2019-12-03 DIAGNOSIS — M25552 Pain in left hip: Secondary | ICD-10-CM | POA: Diagnosis not present

## 2019-12-03 DIAGNOSIS — M6281 Muscle weakness (generalized): Secondary | ICD-10-CM | POA: Diagnosis not present

## 2019-12-05 ENCOUNTER — Encounter (INDEPENDENT_AMBULATORY_CARE_PROVIDER_SITE_OTHER): Payer: Self-pay

## 2019-12-05 DIAGNOSIS — M25551 Pain in right hip: Secondary | ICD-10-CM | POA: Diagnosis not present

## 2019-12-05 DIAGNOSIS — R262 Difficulty in walking, not elsewhere classified: Secondary | ICD-10-CM | POA: Diagnosis not present

## 2019-12-05 DIAGNOSIS — M25552 Pain in left hip: Secondary | ICD-10-CM | POA: Diagnosis not present

## 2019-12-05 DIAGNOSIS — M6281 Muscle weakness (generalized): Secondary | ICD-10-CM | POA: Diagnosis not present

## 2019-12-05 DIAGNOSIS — M799 Soft tissue disorder, unspecified: Secondary | ICD-10-CM | POA: Diagnosis not present

## 2019-12-05 DIAGNOSIS — M545 Low back pain: Secondary | ICD-10-CM | POA: Diagnosis not present

## 2019-12-09 DIAGNOSIS — M6281 Muscle weakness (generalized): Secondary | ICD-10-CM | POA: Diagnosis not present

## 2019-12-09 DIAGNOSIS — M545 Low back pain: Secondary | ICD-10-CM | POA: Diagnosis not present

## 2019-12-09 DIAGNOSIS — M25551 Pain in right hip: Secondary | ICD-10-CM | POA: Diagnosis not present

## 2019-12-09 DIAGNOSIS — M25552 Pain in left hip: Secondary | ICD-10-CM | POA: Diagnosis not present

## 2019-12-09 DIAGNOSIS — R262 Difficulty in walking, not elsewhere classified: Secondary | ICD-10-CM | POA: Diagnosis not present

## 2019-12-09 DIAGNOSIS — M799 Soft tissue disorder, unspecified: Secondary | ICD-10-CM | POA: Diagnosis not present

## 2019-12-11 DIAGNOSIS — M6281 Muscle weakness (generalized): Secondary | ICD-10-CM | POA: Diagnosis not present

## 2019-12-11 DIAGNOSIS — M25551 Pain in right hip: Secondary | ICD-10-CM | POA: Diagnosis not present

## 2019-12-11 DIAGNOSIS — M799 Soft tissue disorder, unspecified: Secondary | ICD-10-CM | POA: Diagnosis not present

## 2019-12-11 DIAGNOSIS — R262 Difficulty in walking, not elsewhere classified: Secondary | ICD-10-CM | POA: Diagnosis not present

## 2019-12-11 DIAGNOSIS — M25552 Pain in left hip: Secondary | ICD-10-CM | POA: Diagnosis not present

## 2019-12-11 DIAGNOSIS — M545 Low back pain: Secondary | ICD-10-CM | POA: Diagnosis not present

## 2019-12-12 ENCOUNTER — Telehealth: Payer: Self-pay

## 2019-12-12 DIAGNOSIS — M799 Soft tissue disorder, unspecified: Secondary | ICD-10-CM | POA: Diagnosis not present

## 2019-12-12 DIAGNOSIS — M545 Low back pain: Secondary | ICD-10-CM | POA: Diagnosis not present

## 2019-12-12 DIAGNOSIS — M25551 Pain in right hip: Secondary | ICD-10-CM | POA: Diagnosis not present

## 2019-12-12 DIAGNOSIS — R262 Difficulty in walking, not elsewhere classified: Secondary | ICD-10-CM | POA: Diagnosis not present

## 2019-12-12 DIAGNOSIS — M25552 Pain in left hip: Secondary | ICD-10-CM | POA: Diagnosis not present

## 2019-12-12 DIAGNOSIS — M6281 Muscle weakness (generalized): Secondary | ICD-10-CM | POA: Diagnosis not present

## 2019-12-12 NOTE — Telephone Encounter (Signed)
PT plan of care from [facility] received, signed and faxed back to [phone]. Plan of care sent for scanning.

## 2019-12-16 DIAGNOSIS — M545 Low back pain: Secondary | ICD-10-CM | POA: Diagnosis not present

## 2019-12-16 DIAGNOSIS — M25551 Pain in right hip: Secondary | ICD-10-CM | POA: Diagnosis not present

## 2019-12-16 DIAGNOSIS — M6281 Muscle weakness (generalized): Secondary | ICD-10-CM | POA: Diagnosis not present

## 2019-12-16 DIAGNOSIS — M799 Soft tissue disorder, unspecified: Secondary | ICD-10-CM | POA: Diagnosis not present

## 2019-12-16 DIAGNOSIS — R262 Difficulty in walking, not elsewhere classified: Secondary | ICD-10-CM | POA: Diagnosis not present

## 2019-12-16 DIAGNOSIS — M25552 Pain in left hip: Secondary | ICD-10-CM | POA: Diagnosis not present

## 2019-12-17 ENCOUNTER — Ambulatory Visit (INDEPENDENT_AMBULATORY_CARE_PROVIDER_SITE_OTHER): Payer: Medicare PPO | Admitting: Otolaryngology

## 2019-12-17 DIAGNOSIS — H401131 Primary open-angle glaucoma, bilateral, mild stage: Secondary | ICD-10-CM | POA: Diagnosis not present

## 2019-12-18 DIAGNOSIS — M545 Low back pain: Secondary | ICD-10-CM | POA: Diagnosis not present

## 2019-12-18 DIAGNOSIS — M6281 Muscle weakness (generalized): Secondary | ICD-10-CM | POA: Diagnosis not present

## 2019-12-18 DIAGNOSIS — M25551 Pain in right hip: Secondary | ICD-10-CM | POA: Diagnosis not present

## 2019-12-18 DIAGNOSIS — M25552 Pain in left hip: Secondary | ICD-10-CM | POA: Diagnosis not present

## 2019-12-18 DIAGNOSIS — R262 Difficulty in walking, not elsewhere classified: Secondary | ICD-10-CM | POA: Diagnosis not present

## 2019-12-18 DIAGNOSIS — M799 Soft tissue disorder, unspecified: Secondary | ICD-10-CM | POA: Diagnosis not present

## 2019-12-19 ENCOUNTER — Ambulatory Visit: Payer: Medicare PPO | Admitting: Family Medicine

## 2019-12-19 ENCOUNTER — Other Ambulatory Visit: Payer: Self-pay

## 2019-12-19 ENCOUNTER — Encounter: Payer: Self-pay | Admitting: Family Medicine

## 2019-12-19 VITALS — BP 120/78 | HR 76 | Temp 98.2°F | Wt 161.0 lb

## 2019-12-19 DIAGNOSIS — L247 Irritant contact dermatitis due to plants, except food: Secondary | ICD-10-CM | POA: Insufficient documentation

## 2019-12-19 DIAGNOSIS — L409 Psoriasis, unspecified: Secondary | ICD-10-CM | POA: Insufficient documentation

## 2019-12-19 MED ORDER — PREDNISONE 10 MG PO TABS: ORAL_TABLET | ORAL | 0 refills | Status: DC

## 2019-12-19 MED ORDER — METHYLPREDNISOLONE ACETATE 80 MG/ML IJ SUSP
80.0000 mg | Freq: Once | INTRAMUSCULAR | Status: AC
  Administered 2019-12-19: 80 mg via INTRAMUSCULAR

## 2019-12-19 NOTE — Patient Instructions (Addendum)
You can continue to take Benadryl as needed for itching.  Be advised Benadryl can make you feel sleepy.  Avoid use if you know you have to drive. A prescription for a prednisone taper was sent to your pharmacy.  You can start taking this medication first thing tomorrow morning.  For worsening symptoms including shortness of breath, difficulty swallowing, increased swelling in your throat please call 911 and proceed to the nearest emergency department.  Contact Dermatitis Dermatitis is redness, soreness, and swelling (inflammation) of the skin. Contact dermatitis is a reaction to certain substances that touch the skin. Many different substances can cause contact dermatitis. There are two types of contact dermatitis:  Irritant contact dermatitis. This type is caused by something that irritates your skin, such as having dry hands from washing them too often with soap. This type does not require previous exposure to the substance for a reaction to occur. This is the most common type.  Allergic contact dermatitis. This type is caused by a substance that you are allergic to, such as poison ivy. This type occurs when you have been exposed to the substance (allergen) and develop a sensitivity to it. Dermatitis may develop soon after your first exposure to the allergen, or it may not develop until the next time you are exposed and every time thereafter. What are the causes? Irritant contact dermatitis is most commonly caused by exposure to:  Makeup.  Soaps.  Detergents.  Bleaches.  Acids.  Metal salts, such as nickel. Allergic contact dermatitis is most commonly caused by exposure to:  Poisonous plants.  Chemicals.  Jewelry.  Latex.  Medicines.  Preservatives in products, such as clothing. What increases the risk? You are more likely to develop this condition if you have:  A job that exposes you to irritants or allergens.  Certain medical conditions, such as asthma or eczema. What  are the signs or symptoms? Symptoms of this condition may occur on your body anywhere the irritant has touched you or is touched by you.  Symptoms include: ? Dryness or flaking. ? Redness. ? Cracks. ? Itching. ? Pain or a burning feeling. ? Blisters. ? Drainage of small amounts of blood or clear fluid from skin cracks. With allergic contact dermatitis, there may also be swelling in areas such as the eyelids, mouth, or genitals. How is this diagnosed? This condition is diagnosed with a medical history and physical exam.  A patch skin test may be performed to help determine the cause.  If the condition is related to your job, you may need to see an occupational medicine specialist. How is this treated? This condition is treated by checking for the cause of the reaction and protecting your skin from further contact. Treatment may also include:  Steroid creams or ointments. Oral steroid medicines may be needed in more severe cases.  Antibiotic medicines or antibacterial ointments, if a skin infection is present.  Antihistamine lotion or an antihistamine taken by mouth to ease itching.  A bandage (dressing). Follow these instructions at home: Skin care  Moisturize your skin as needed.  Apply cool compresses to the affected areas.  Try applying baking soda paste to your skin. Stir water into baking soda until it reaches a paste-like consistency.  Do not scratch your skin, and avoid friction to the affected area.  Avoid the use of soaps, perfumes, and dyes. Medicines  Take or apply over-the-counter and prescription medicines only as told by your health care provider.  If you were prescribed an  antibiotic medicine, take or apply the antibiotic as told by your health care provider. Do not stop using the antibiotic even if your condition improves. Bathing  Try taking a bath with: ? Epsom salts. Follow the instructions on the packaging. You can get these at your local pharmacy or  grocery store. ? Baking soda. Pour a small amount into the bath as directed by your health care provider. ? Colloidal oatmeal. Follow the instructions on the packaging. You can get this at your local pharmacy or grocery store.  Bathe less frequently, such as every other day.  Bathe in lukewarm water. Avoid using hot water. Bandage care  If you were given a bandage (dressing), change it as told by your health care provider.  Wash your hands with soap and water before and after you change your dressing. If soap and water are not available, use hand sanitizer. General instructions  Avoid the substance that caused your reaction. If you do not know what caused it, keep a journal to try to track what caused it. Write down: ? What you eat. ? What cosmetic products you use. ? What you drink. ? What you wear in the affected area. This includes jewelry.  Check the affected areas every day for signs of infection. Check for: ? More redness, swelling, or pain. ? More fluid or blood. ? Warmth. ? Pus or a bad smell.  Keep all follow-up visits as told by your health care provider. This is important. Contact a health care provider if:  Your condition does not improve with treatment.  Your condition gets worse.  You have signs of infection such as swelling, tenderness, redness, soreness, or warmth in the affected area.  You have a fever.  You have new symptoms. Get help right away if:  You have a severe headache, neck pain, or neck stiffness.  You vomit.  You feel very sleepy.  You notice red streaks coming from the affected area.  Your bone or joint underneath the affected area becomes painful after the skin has healed.  The affected area turns darker.  You have difficulty breathing. Summary  Dermatitis is redness, soreness, and swelling (inflammation) of the skin. Contact dermatitis is a reaction to certain substances that touch the skin.  Symptoms of this condition may occur  on your body anywhere the irritant has touched you or is touched by you.  This condition is treated by figuring out what caused the reaction and protecting your skin from further contact. Treatment may also include medicines and skin care.  Avoid the substance that caused your reaction. If you do not know what caused it, keep a journal to try to track what caused it.  Contact a health care provider if your condition gets worse or you have signs of infection such as swelling, tenderness, redness, soreness, or warmth in the affected area. This information is not intended to replace advice given to you by your health care provider. Make sure you discuss any questions you have with your health care provider. Document Revised: 08/21/2018 Document Reviewed: 11/14/2017 Elsevier Patient Education  [place].

## 2019-12-19 NOTE — Progress Notes (Signed)
Subjective:    Patient ID: Cynthia [name], female    DOB: 23-Dec-1945, 74 y.o.   MRN: [id number]  No chief complaint on file.   HPI Pt is a 74 yo female with pmh sig for psoriasis, seasonal allergies, migraines, TIA, GERD, OA, osteopenia, HLD, anxiety, depression, chronic constipation followed by Dr. Larose Kells who was seen for acute concern.  Pt endorses rash x2-3 days.  States any recurrence of pain pulling weeds.  Pt has [place] in her yard.  Noticed rash on left forearm which is since spread to right side of face, posterior neck, and back.  t is also noted edema in base of left lateral neck.  Pt feels like her throat is constricted.  Noticed yesterday when trying to swallow a Mucinex pill.  Tried to break the pill in half but still could not get it down.  Pt denies drooling, SOB, sore throat, hoarse voice.  Pt tired 2.5% hydrocortisone cream and Benadryl lotion for her symptoms.  Past Medical History:  Diagnosis Date  . Allergy   . Anxiety   . Arthritis   . Blood transfusion without reported diagnosis   . Constipation, chronic   . DEGENERATIVE JOINT DISEASE 06/02/2010  . Depression   . Glaucoma 2014   Right eye  . Headache(784.0)   . Hyperlipidemia    hx of  . Osteopenia   . Psoriasis   . PUD (peptic ulcer disease) 2012   Upper GI bleed d/t NSAIDs  . RLS (restless legs syndrome)    robaxin prn    Allergies  Allergen Reactions  . Motrin [Ibuprofen]     GI bleed  . Pollen Extract   . Statins     "delibitating pain all over body"  . Sulfonamide Derivatives     Pt unsure of reaction  . Ciprofloxacin Swelling and Rash    ROS General: Denies fever, chills, night sweats, changes in weight, changes in appetite HEENT: Denies headaches, ear pain, changes in vision, rhinorrhea, sore throat CV: Denies CP, palpitations, SOB, orthopnea Pulm: Denies SOB, cough, wheezing GI: Denies abdominal pain, nausea, vomiting, diarrhea, constipation  +dysphagia with pills GU: Denies dysuria,  hematuria, frequency, vaginal discharge Msk: Denies muscle cramps, joint pains  +edema of L neck Neuro: Denies weakness, numbness, tingling Skin: Denies bruising  +rash Psych: Denies depression, anxiety, hallucinations     Objective:    Blood pressure 120/78, pulse 76, temperature 98.2 F (36.8 C), temperature source Oral, weight 161 lb (73 kg), SpO2 97 %.   Gen. Pleasant, well-nourished, in no distress, normal affect   HEENT: [place]/AT, face symmetric, conjunctiva clear, no scleral icterus, PERRLA, EOMI, nares patent without drainage, pharynx without erythema or exudate. Neck: No JVD, no thyromegaly.  L lateral neck and supraclavicular area with edema.  Cervical and posterior auricular lymphadenopathy appreciated. Lungs: no accessory muscle use Cardiovascular: RRR, no peripheral edema Neuro:  A&Ox3, CN II-XII intact, normal gait Skin:  Warm, dry, intact.  Erythematous plaque on R lateral face/preauricular area.             Wt Readings from Last 3 Encounters:  12/19/19 161 lb (73 kg)  11/26/19 160 lb 4 oz (72.7 kg)  09/17/19 160 lb 2 oz (72.6 kg)    Lab Results  Component Value Date   WBC 5.7 09/15/2019   HGB 12.2 09/15/2019   HCT 36.7 09/15/2019   PLT 331 09/15/2019   GLUCOSE 86 09/14/2019   CHOL 259 (H) 09/15/2019   TRIG 148 09/15/2019  HDL 67 09/15/2019   LDLDIRECT 187.8 06/20/2013   LDLCALC 162 (H) 09/15/2019   ALT 20 09/14/2019   AST 24 09/14/2019   NA 133 (L) 09/14/2019   K 4.1 09/14/2019   CL 100 09/14/2019   CREATININE 0.91 09/15/2019   BUN 11 09/14/2019   CO2 24 09/14/2019   TSH 1.51 11/22/2016   INR 0.9 09/14/2019   HGBA1C 5.2 09/15/2019    Assessment/Plan: Patient is a 74 year old female who presents with acute contact dermatitis and edema of neck.  Symptoms likely due to contact with [place] reaper plant.  Left lateral neck edema better appreciated in person down from above picture.  Given 80 mg injection IM in clinic.  Tolerated well.  Will  start prednisone taper tomorrow morning.  Okay to continue supportive care with Benadryl p.o. and topical ointments as needed.  Given edema of neck and reported dysphagia with pills patient given strict precautions.  Advised to call 911/proceed to nearest ED for any worsening symptoms.  Irritant contact dermatitis due to plants, except food  - Plan: methylPREDNISolone acetate (DEPO-MEDROL) injection 80 mg, predniSONE (DELTASONE) 10 MG tablet  Psoriasis -stable -discussed may flare given current dermatitis.  F/u prn in the next few days with pcp.  Grier Mitts, MD

## 2019-12-24 DIAGNOSIS — M799 Soft tissue disorder, unspecified: Secondary | ICD-10-CM | POA: Diagnosis not present

## 2019-12-24 DIAGNOSIS — M25552 Pain in left hip: Secondary | ICD-10-CM | POA: Diagnosis not present

## 2019-12-24 DIAGNOSIS — M25551 Pain in right hip: Secondary | ICD-10-CM | POA: Diagnosis not present

## 2019-12-24 DIAGNOSIS — R262 Difficulty in walking, not elsewhere classified: Secondary | ICD-10-CM | POA: Diagnosis not present

## 2019-12-24 DIAGNOSIS — M545 Low back pain: Secondary | ICD-10-CM | POA: Diagnosis not present

## 2019-12-24 DIAGNOSIS — M6281 Muscle weakness (generalized): Secondary | ICD-10-CM | POA: Diagnosis not present

## 2019-12-26 DIAGNOSIS — M799 Soft tissue disorder, unspecified: Secondary | ICD-10-CM | POA: Diagnosis not present

## 2019-12-26 DIAGNOSIS — M6281 Muscle weakness (generalized): Secondary | ICD-10-CM | POA: Diagnosis not present

## 2019-12-26 DIAGNOSIS — R262 Difficulty in walking, not elsewhere classified: Secondary | ICD-10-CM | POA: Diagnosis not present

## 2019-12-26 DIAGNOSIS — M25551 Pain in right hip: Secondary | ICD-10-CM | POA: Diagnosis not present

## 2019-12-26 DIAGNOSIS — M545 Low back pain: Secondary | ICD-10-CM | POA: Diagnosis not present

## 2019-12-26 DIAGNOSIS — M25552 Pain in left hip: Secondary | ICD-10-CM | POA: Diagnosis not present

## 2019-12-30 DIAGNOSIS — M6281 Muscle weakness (generalized): Secondary | ICD-10-CM | POA: Diagnosis not present

## 2019-12-30 DIAGNOSIS — M545 Low back pain: Secondary | ICD-10-CM | POA: Diagnosis not present

## 2019-12-30 DIAGNOSIS — M799 Soft tissue disorder, unspecified: Secondary | ICD-10-CM | POA: Diagnosis not present

## 2019-12-30 DIAGNOSIS — R262 Difficulty in walking, not elsewhere classified: Secondary | ICD-10-CM | POA: Diagnosis not present

## 2019-12-30 DIAGNOSIS — M25551 Pain in right hip: Secondary | ICD-10-CM | POA: Diagnosis not present

## 2019-12-30 DIAGNOSIS — M25552 Pain in left hip: Secondary | ICD-10-CM | POA: Diagnosis not present

## 2020-01-01 DIAGNOSIS — M545 Low back pain: Secondary | ICD-10-CM | POA: Diagnosis not present

## 2020-01-01 DIAGNOSIS — M799 Soft tissue disorder, unspecified: Secondary | ICD-10-CM | POA: Diagnosis not present

## 2020-01-01 DIAGNOSIS — M25552 Pain in left hip: Secondary | ICD-10-CM | POA: Diagnosis not present

## 2020-01-01 DIAGNOSIS — M6281 Muscle weakness (generalized): Secondary | ICD-10-CM | POA: Diagnosis not present

## 2020-01-01 DIAGNOSIS — R262 Difficulty in walking, not elsewhere classified: Secondary | ICD-10-CM | POA: Diagnosis not present

## 2020-01-01 DIAGNOSIS — M25551 Pain in right hip: Secondary | ICD-10-CM | POA: Diagnosis not present

## 2020-01-06 DIAGNOSIS — M799 Soft tissue disorder, unspecified: Secondary | ICD-10-CM | POA: Diagnosis not present

## 2020-01-06 DIAGNOSIS — M6281 Muscle weakness (generalized): Secondary | ICD-10-CM | POA: Diagnosis not present

## 2020-01-06 DIAGNOSIS — M545 Low back pain: Secondary | ICD-10-CM | POA: Diagnosis not present

## 2020-01-06 DIAGNOSIS — M25551 Pain in right hip: Secondary | ICD-10-CM | POA: Diagnosis not present

## 2020-01-06 DIAGNOSIS — M25552 Pain in left hip: Secondary | ICD-10-CM | POA: Diagnosis not present

## 2020-01-06 DIAGNOSIS — R262 Difficulty in walking, not elsewhere classified: Secondary | ICD-10-CM | POA: Diagnosis not present

## 2020-01-09 NOTE — Progress Notes (Signed)
Virtual Visit via Video Note The purpose of this virtual visit is to provide medical care while limiting exposure to the novel coronavirus.    Consent was obtained for video visit:  Yes.   Answered questions that patient had about telehealth interaction:  Yes.   I discussed the limitations, risks, security and privacy concerns of performing an evaluation and management service by telemedicine. I also discussed with the patient that there may be a patient responsible charge related to this service. The patient expressed understanding and agreed to proceed.  Pt location: Home Physician Location: office Name of referring provider:  Colon Branch, MD I connected with Cynthia [name] at patients initiation/request on 01/12/2020 at 10:30 AM EDT by video enabled telemedicine application and verified that I am speaking with the correct person using two identifiers. Pt MRN:  [id number] Pt DOB:  Sep 11, 1945 Video Participants:  Cynthia [name]   History of Present Illness:  Cynthia [name] is a 74 year old right-handed woman with depression, glaucoma and degenerative joint disease who follows up for headaches and TIA.  UPDATE: Currently planning putting her house on the market and moving into a 2 bedroom room apartment.   Intensity:  moderate Duration:  A couple of hours Frequency:  2 in past month. Current NSAIDS:ASA 81mg  daily Current analgesics:tramadol; acetaminophen-caffeine Current triptans:none Current ergotamine:no Current anti-emetic:Zofran Current muscle relaxants:Robaxin 500mg TID Current anti-anxiolytic:no Current sleep aide:Ambien Current Antihypertensive medications:verapamil CR 180mg  Current Antidepressant medications:Sertraline100mg , bupropion300mg  Current Anticonvulsant medications:no Current anti-CGRP:no Current Vitamins/Herbal/Supplements:B complex, Mg Current Antihistamines/Decongestants:Mucinex, Astelin Other therapy:1 cup black coffee  with melatonin 10mg  at bedtime Other medication: Zetia  No subsequent stroke-like symptoms.  Depression and anxiety: Yes Other pain: Joint pain  HISTORY: She started having headaches infrequently in 2015but progressively became more frequent in 2016.Varies, but they are usually left sided, involving the ear, but also may be right sided (involving the sinuses) or in band-like distribution.It is both throbbing and non-throbbing.Initially, it is usually 5/10 but severe episodes are 7-8/10.Sometimes there is nausea.There is phonophobia.There is noassociatedphotophobia,visual disturbanceor unilateral numbness or weakness.Initially, it lasts a couple of hours and occuring 15 days out of the month (3 to 4 days severe).It wakes her up at 4:30 to 5 AM.She reports sensation of congestion but no runny nose.CT of sinuses showed opacified right sphenoid sinus and ethmoid air cell.She was treated a couple of times with Augmentin and prednisone, which was effective for a while but headaches then returned.She cannot think of a trigger. They are worse during the summer.There are no aggravating or relieving factors.MRI of brain from 06/10/15 was normal. Sed Rate from 12/27/15 was 10.  She was admitted to [facility] on 09/14/2019 for TIA presenting as transient left sided hemibody numbness (first leg, then arm, then face).  Mostly resolved quickly but lingered for several hours.  CT head showed right internal capsule hypodensity but follow up MRI of brain showed no acute infarct.  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  2D echo showed EF 60-65% with no source of embolus.  LDL was 162 and Hgb A1c was 5.2.  She was started on ASA 81mg  daily and Plavix 75mg  for 3 weeks followed by ASA alone.  The next morning she had a habitual migraine headache except it lasted all day.  She was also started on Zetia (she has statin intolerance).  Unclear if event was TIA or complicated  migraine.  Past NSAIDS:ibuprofen (ulcers) Past analgesics:Tylenol Past abortive triptans:sumatriptan (advised to discontinue  due to age) Past abortive ergotamine:no Past muscle relaxants:no Past anti-emetic:no Past antihypertensive medications:no Past antidepressant medications:Nortriptyline 50mg  Past anticonvulsant medications:topiramate 25mg  (concerned about glaucoma) Past anti-CGRP:no Past vitamins/Herbal/Supplements:no Past antihistamines/decongestants:no Other past therapies:none  Past Medical History: Past Medical History:  Diagnosis Date  . Allergy   . Anxiety   . Arthritis   . Blood transfusion without reported diagnosis   . Constipation, chronic   . DEGENERATIVE JOINT DISEASE 06/02/2010  . Depression   . Glaucoma 2014   Right eye  . Headache(784.0)   . Hyperlipidemia    hx of  . Osteopenia   . Psoriasis   . PUD (peptic ulcer disease) 2012   Upper GI bleed d/t NSAIDs  . RLS (restless legs syndrome)    robaxin prn    Medications: Outpatient Encounter Medications as of 01/12/2020  Medication Sig  . aspirin EC 81 MG EC tablet Take 1 tablet (81 mg total) by mouth daily.  Marland Kitchen buPROPion (WELLBUTRIN XL) 300 MG 24 hr tablet Take 1 tablet (300 mg total) by mouth daily.  . cetirizine (ZYRTEC) 10 MG tablet Take 10 mg by mouth at bedtime as needed for allergies.   Marland Kitchen ezetimibe (ZETIA) 10 MG tablet Take 1 tablet (10 mg total) by mouth daily.  . methocarbamol (ROBAXIN) 500 MG tablet Take 1 tablet (500 mg total) by mouth 3 (three) times daily as needed. (Patient taking differently: Take 500 mg by mouth 3 (three) times daily as needed for muscle spasms. )  . pantoprazole (PROTONIX) 40 MG tablet Take 1 tablet (40 mg total) by mouth daily.  . sertraline (ZOLOFT) 100 MG tablet Take 1 tablet (100 mg total) by mouth daily. (Patient taking differently: Take 100 mg by mouth at bedtime. )  . traMADol (ULTRAM) 50 MG tablet Take 1 tablet (50 mg total) by mouth  every 6 (six) hours as needed.  . Travoprost, BAK Free, (TRAVATAN) 0.004 % SOLN ophthalmic solution Place 1 drop into the right eye at bedtime.  . valACYclovir (VALTREX) 1000 MG tablet Take 1 tablet (1,000 mg total) by mouth 3 (three) times daily.  . verapamil (CALAN-SR) 180 MG CR tablet TAKE ONE TABLET BY MOUTH EVERY NIGHT AT BEDTIME  . zolpidem (AMBIEN) 5 MG tablet TAKE ONE TABLET BY MOUTH EVERY NIGHT AT BEDTIME AS NEEDED FOR SLEEP  . [DISCONTINUED] predniSONE (DELTASONE) 10 MG tablet Take 6 tabs every morning for 3 days, 5 tabs for 3 days, 4 tabs for 2 days, 3 tabs for 2 days, 2 tabs for 2 days, 1 tab for 2 days.   No facility-administered encounter medications on file as of 01/12/2020.    Allergies: Allergies  Allergen Reactions  . Motrin [Ibuprofen]     GI bleed  . Pollen Extract   . Statins     "delibitating pain all over body"  . Sulfonamide Derivatives     Pt unsure of reaction  . Ciprofloxacin Swelling and Rash    Family History: Family History  Problem Relation Age of Onset  . Aneurysm Mother        brain  . Celiac disease Brother   . Diabetes Maternal Grandfather   . Hypertension Neg Hx   . Breast cancer Neg Hx   . Coronary artery disease Neg Hx   . Colon cancer Neg Hx   . Esophageal cancer Neg Hx   . Stomach cancer Neg Hx   . Rectal cancer Neg Hx     Social History: Social History   Socioeconomic History  .  Marital status: Widowed    Spouse name: Not on file  . Number of children: 1  . Years of education: Not on file  . Highest education level: Doctorate  Occupational History  . Occupation: retired Environmental consultant: RETIRED  Tobacco Use  . Smoking status: Never Smoker  . Smokeless tobacco: Never Used  Vaping Use  . Vaping Use: Never used  Substance and Sexual Activity  . Alcohol use: Yes    Comment: socially  . Drug use: No  . Sexual activity: Not on file  Other Topics Concern  . Not on file  Social History Narrative   2 step  children, 1 child , [place]- patient and her husband, he recently stop driving        Lives in two story home      Pt is right handed   Social Determinants of Health   Financial Resource Strain:   . Difficulty of Paying Living Expenses: Not on file  Food Insecurity:   . Worried About Charity fundraiser in the Last Year: Not on file  . Ran Out of Food in the Last Year: Not on file  Transportation Needs:   . Lack of Transportation (Medical): Not on file  . Lack of Transportation (Non-Medical): Not on file  Physical Activity:   . Days of Exercise per Week: Not on file  . Minutes of Exercise per Session: Not on file  Stress:   . Feeling of Stress : Not on file  Social Connections:   . Frequency of Communication with Friends and Family: Not on file  . Frequency of Social Gatherings with Friends and Family: Not on file  . Attends Religious Services: Not on file  . Active Member of Clubs or Organizations: Not on file  . Attends Archivist Meetings: Not on file  . Marital Status: Not on file  Intimate Partner Violence:   . Fear of Current or Ex-Partner: Not on file  . Emotionally Abused: Not on file  . Physically Abused: Not on file  . Sexually Abused: Not on file    Observations/Objective:   There were no vitals taken for this visit. No acute distress.  Alert and oriented.  Speech fluent and not dysarthric.  Language intact.   Assessment and Plan:   1.  Migraine without aura, without status migrainosus, not intractable 2.  Transient left hemibody numbness.  TIA vs migraine.  As no prior history, must assume TIA. 3.  Small Acom aneurysm  PLAN: 1.  Migraine prevention:  Verapamil CR 180mg  2.  Migraine abortive therapy:  Tramadol 3.  Secondary stroke prevention as managed by PCP  -  ASA 81mg  daily  -  Zetia.  LDL goal less than 70  -  Blood pressure control.    -  Glycemic control.  Hgb A1c goal less than 7 4.  Repeat MRA of head in 9 months 5.  Follow  up 9 months (after repeat MRA)  Follow Up Instructions:    -I discussed the assessment and treatment plan with the patient. The patient was provided an opportunity to ask questions and all were answered. The patient agreed with the plan and demonstrated an understanding of the instructions.   The patient was advised to call back or seek an in-person evaluation if the symptoms worsen or if the condition fails to improve as anticipated.    Dudley Major, DO

## 2020-01-12 ENCOUNTER — Encounter: Payer: Self-pay | Admitting: Neurology

## 2020-01-12 ENCOUNTER — Other Ambulatory Visit: Payer: Self-pay

## 2020-01-12 ENCOUNTER — Telehealth (INDEPENDENT_AMBULATORY_CARE_PROVIDER_SITE_OTHER): Payer: Medicare PPO | Admitting: Neurology

## 2020-01-12 DIAGNOSIS — G43009 Migraine without aura, not intractable, without status migrainosus: Secondary | ICD-10-CM | POA: Diagnosis not present

## 2020-01-12 DIAGNOSIS — G459 Transient cerebral ischemic attack, unspecified: Secondary | ICD-10-CM | POA: Diagnosis not present

## 2020-01-12 DIAGNOSIS — I671 Cerebral aneurysm, nonruptured: Secondary | ICD-10-CM

## 2020-01-13 DIAGNOSIS — M25552 Pain in left hip: Secondary | ICD-10-CM | POA: Diagnosis not present

## 2020-01-13 DIAGNOSIS — M25551 Pain in right hip: Secondary | ICD-10-CM | POA: Diagnosis not present

## 2020-01-13 DIAGNOSIS — M799 Soft tissue disorder, unspecified: Secondary | ICD-10-CM | POA: Diagnosis not present

## 2020-01-13 DIAGNOSIS — R262 Difficulty in walking, not elsewhere classified: Secondary | ICD-10-CM | POA: Diagnosis not present

## 2020-01-13 DIAGNOSIS — M6281 Muscle weakness (generalized): Secondary | ICD-10-CM | POA: Diagnosis not present

## 2020-01-13 DIAGNOSIS — M545 Low back pain: Secondary | ICD-10-CM | POA: Diagnosis not present

## 2020-01-20 DIAGNOSIS — M545 Low back pain: Secondary | ICD-10-CM | POA: Diagnosis not present

## 2020-01-20 DIAGNOSIS — R262 Difficulty in walking, not elsewhere classified: Secondary | ICD-10-CM | POA: Diagnosis not present

## 2020-01-20 DIAGNOSIS — M799 Soft tissue disorder, unspecified: Secondary | ICD-10-CM | POA: Diagnosis not present

## 2020-01-20 DIAGNOSIS — M25552 Pain in left hip: Secondary | ICD-10-CM | POA: Diagnosis not present

## 2020-01-20 DIAGNOSIS — M6281 Muscle weakness (generalized): Secondary | ICD-10-CM | POA: Diagnosis not present

## 2020-01-20 DIAGNOSIS — M25551 Pain in right hip: Secondary | ICD-10-CM | POA: Diagnosis not present

## 2020-01-22 ENCOUNTER — Telehealth: Payer: Self-pay

## 2020-01-22 NOTE — Telephone Encounter (Signed)
Form completed, awaiting PCP signature.

## 2020-01-23 NOTE — Telephone Encounter (Signed)
Forms signed and faxed to [facility] at [phone]. Form sent for scanning.

## 2020-01-23 NOTE — Telephone Encounter (Signed)
Fax has failed numerous times- called [employer] for alternate fax number, that one also fails. I will try again on Monday.

## 2020-01-26 NOTE — Telephone Encounter (Signed)
Fax number to [employer] still failing. Original forms mailed back to [address], [place], [place] [address]. Copy of forms have been sent for scanning.

## 2020-01-28 ENCOUNTER — Encounter: Payer: Self-pay | Admitting: Internal Medicine

## 2020-01-29 NOTE — Telephone Encounter (Signed)
Appointment scheduled with Percell Miller

## 2020-01-30 ENCOUNTER — Other Ambulatory Visit: Payer: Self-pay

## 2020-01-30 ENCOUNTER — Ambulatory Visit: Payer: Medicare PPO | Admitting: Medical

## 2020-01-30 VITALS — BP 133/70 | HR 81 | Temp 98.2°F | Ht 65.0 in | Wt 162.8 lb

## 2020-01-30 DIAGNOSIS — M25562 Pain in left knee: Secondary | ICD-10-CM

## 2020-01-30 NOTE — Progress Notes (Signed)
Subjective:    Patient ID: Cynthia [name], female    DOB: 1945/06/20, 74 y.o.   MRN: [id number]  HPI  Pt in with some left knee pain x 2 weeks. She was moving items around house. She almost fell. She twisted her knees and has had pain since.  Her left knee is swollen and still tender. The knee is not getting better. No calf swelling and no popliteal pain.  Pt only taking tylenol for pain.  Pt states remote hx of stomach ulcer when took high dose ibuprofen.     Review of Systems  Constitutional: Negative for chills, fatigue and fever.  Respiratory: Negative for choking, shortness of breath and wheezing.   Cardiovascular: Negative for chest pain and palpitations.  Musculoskeletal:       Knee pain  Skin: Negative for rash.  Neurological: Negative for dizziness and headaches.  Hematological: Negative for adenopathy.    Past Medical History:  Diagnosis Date  . Allergy   . Anxiety   . Arthritis   . Blood transfusion without reported diagnosis   . Constipation, chronic   . DEGENERATIVE JOINT DISEASE 06/02/2010  . Depression   . Glaucoma 2014   Right eye  . Headache(784.0)   . Hyperlipidemia    hx of  . Osteopenia   . Psoriasis   . PUD (peptic ulcer disease) 2012   Upper GI bleed d/t NSAIDs  . RLS (restless legs syndrome)    robaxin prn     Social History   Socioeconomic History  . Marital status: Widowed    Spouse name: Not on file  . Number of children: 1  . Years of education: Not on file  . Highest education level: Doctorate  Occupational History  . Occupation: retired Environmental consultant: RETIRED  Tobacco Use  . Smoking status: Never Smoker  . Smokeless tobacco: Never Used  Vaping Use  . Vaping Use: Never used  Substance and Sexual Activity  . Alcohol use: Yes    Comment: socially  . Drug use: No  . Sexual activity: Not on file  Other Topics Concern  . Not on file  Social History Narrative   2 step children, 1 child , [place]- patient and her husband, he recently stop driving        Lives in two story home      Pt is right handed   Social Determinants of Health   Financial Resource Strain:   . Difficulty of Paying Living Expenses: Not on file  Food Insecurity:   . Worried About Charity fundraiser in the Last Year: Not on file  . Ran Out of Food in the Last Year: Not on file  Transportation Needs:   . Lack of Transportation (Medical): Not on file  . Lack of Transportation (Non-Medical): Not on file  Physical Activity:   . Days of Exercise per Week: Not on file  . Minutes of Exercise per Session: Not on file  Stress:   . Feeling of Stress : Not on file  Social Connections:   . Frequency of Communication with Friends and Family: Not on file  . Frequency of Social Gatherings with Friends and Family: Not on file  . Attends Religious Services: Not on file  . Active Member of Clubs or Organizations: Not on file  . Attends Archivist Meetings: Not on file  . Marital Status: Not on file  Intimate Partner Violence:   .  Fear of Current or Ex-Partner: Not on file  . Emotionally Abused: Not on file  . Physically Abused: Not on file  . Sexually Abused: Not on file    Past Surgical History:  Procedure Laterality Date  . ANKLE FRACTURE SURGERY Left    surgery 12-11 (LEFT)  . CESAREAN SECTION  1969    Family History  Problem Relation Age of Onset  . Aneurysm Mother        brain  . Celiac disease Brother   . Diabetes Maternal Grandfather   . Hypertension Neg Hx   . Breast cancer Neg Hx   . Coronary artery disease Neg Hx   . Colon cancer Neg Hx   . Esophageal cancer Neg Hx   . Stomach cancer Neg Hx   . Rectal cancer Neg Hx     Allergies  Allergen Reactions  . Motrin [Ibuprofen]     GI bleed  . Pollen Extract   . Statins     "delibitating pain all over body"  . Sulfonamide Derivatives     Pt unsure of reaction  . Ciprofloxacin Swelling and Rash    Current Outpatient  Medications on File Prior to Visit  Medication Sig Dispense Refill  . buPROPion (WELLBUTRIN XL) 300 MG 24 hr tablet Take 1 tablet (300 mg total) by mouth daily. 90 tablet 1  . cetirizine (ZYRTEC) 10 MG tablet Take 10 mg by mouth at bedtime as needed for allergies.     Marland Kitchen ezetimibe (ZETIA) 10 MG tablet Take 1 tablet (10 mg total) by mouth daily. 90 tablet 3  . methocarbamol (ROBAXIN) 500 MG tablet Take 1 tablet (500 mg total) by mouth 3 (three) times daily as needed. (Patient taking differently: Take 500 mg by mouth 3 (three) times daily as needed for muscle spasms. ) 90 tablet 1  . pantoprazole (PROTONIX) 40 MG tablet Take 1 tablet (40 mg total) by mouth daily. 30 tablet 3  . sertraline (ZOLOFT) 100 MG tablet Take 1 tablet (100 mg total) by mouth daily. (Patient taking differently: Take 100 mg by mouth at bedtime. ) 90 tablet 3  . traMADol (ULTRAM) 50 MG tablet Take 1 tablet (50 mg total) by mouth every 6 (six) hours as needed. 20 tablet 2  . Travoprost, BAK Free, (TRAVATAN) 0.004 % SOLN ophthalmic solution Place 1 drop into the right eye at bedtime.    . valACYclovir (VALTREX) 1000 MG tablet Take 1 tablet (1,000 mg total) by mouth 3 (three) times daily. 30 tablet 0  . verapamil (CALAN-SR) 180 MG CR tablet TAKE ONE TABLET BY MOUTH EVERY NIGHT AT BEDTIME 90 tablet 4  . zolpidem (AMBIEN) 5 MG tablet TAKE ONE TABLET BY MOUTH EVERY NIGHT AT BEDTIME AS NEEDED FOR SLEEP 30 tablet 3   No current facility-administered medications on file prior to visit.    BP 133/70   Pulse 81   Temp 98.2 F (36.8 C) (Oral)   Ht 5\' 5"  (1.651 m)   Wt 162 lb 12.8 oz (73.8 kg)   SpO2 100%   BMI 27.09 kg/m      Objective:   Physical Exam  General- No acute distress. Pleasant patient. Neck- Full range of motion, no jvd Lungs- Clear, even and unlabored. Heart- regular rate and rhythm. Neurologic- CNII- XII grossly intact.  Left knee pain- moderate swelling. Medial aspect tender to palpation. Tibial plateau  region mild tender to palpation. Good rom. No crepitus. Calfs symmetric. Negative homans sign.      Assessment &  Plan:  For knee pain will get xray of left knee. R/O fracture.  Advise low dose ibuprofen 200-400 mg every 8 hours and can use with low dose tylenol.  Consider referral to sports med early next week after xray review. If not better by next week.  Continue knee brace.  Follow up 5-7 days or as needed  [employer], [employer]

## 2020-01-30 NOTE — Patient Instructions (Addendum)
For knee pain will get xray of left knee. R/O fracture.  Advise low dose ibuprofen 200-400 mg every 8 hours and can use with low dose tylenol.  Consider referral to sports med early next week after xray review. If not better by next week.  Continue knee brace.  Follow up 5-7 days or as needed

## 2020-02-01 ENCOUNTER — Encounter: Payer: Self-pay | Admitting: Internal Medicine

## 2020-02-02 ENCOUNTER — Ambulatory Visit (HOSPITAL_BASED_OUTPATIENT_CLINIC_OR_DEPARTMENT_OTHER)
Admission: RE | Admit: 2020-02-02 | Discharge: 2020-02-02 | Disposition: A | Discharge status: Home / Self Care | Payer: Medicare PPO | Source: Ambulatory Visit | Attending: Medical | Admitting: Medical

## 2020-02-02 ENCOUNTER — Encounter: Payer: Self-pay | Admitting: Medical

## 2020-02-02 ENCOUNTER — Other Ambulatory Visit: Payer: Self-pay

## 2020-02-02 DIAGNOSIS — S8992XA Unspecified injury of left lower leg, initial encounter: Secondary | ICD-10-CM | POA: Diagnosis not present

## 2020-02-02 DIAGNOSIS — M25562 Pain in left knee: Secondary | ICD-10-CM | POA: Diagnosis not present

## 2020-02-05 ENCOUNTER — Ambulatory Visit: Payer: Medicare PPO | Admitting: Medical

## 2020-02-16 ENCOUNTER — Telehealth: Payer: Self-pay

## 2020-02-16 NOTE — Telephone Encounter (Signed)
Plan of care signed and faxed back to [facility] at [phone]. Form sent for scanning.  

## 2020-02-17 ENCOUNTER — Ambulatory Visit (HOSPITAL_BASED_OUTPATIENT_CLINIC_OR_DEPARTMENT_OTHER)
Admission: RE | Admit: 2020-02-17 | Discharge: 2020-02-17 | Disposition: A | Discharge status: Home / Self Care | Payer: Medicare PPO | Source: Ambulatory Visit | Attending: Internal Medicine | Admitting: Internal Medicine

## 2020-02-17 ENCOUNTER — Encounter (HOSPITAL_BASED_OUTPATIENT_CLINIC_OR_DEPARTMENT_OTHER): Payer: Self-pay

## 2020-02-17 ENCOUNTER — Other Ambulatory Visit (HOSPITAL_BASED_OUTPATIENT_CLINIC_OR_DEPARTMENT_OTHER): Payer: Self-pay | Admitting: Internal Medicine

## 2020-02-17 ENCOUNTER — Other Ambulatory Visit: Payer: Self-pay

## 2020-02-17 DIAGNOSIS — Z1231 Encounter for screening mammogram for malignant neoplasm of breast: Secondary | ICD-10-CM | POA: Diagnosis not present

## 2020-03-01 ENCOUNTER — Telehealth: Payer: Self-pay | Admitting: Medical

## 2020-03-01 DIAGNOSIS — M25562 Pain in left knee: Secondary | ICD-10-CM

## 2020-03-01 NOTE — Telephone Encounter (Signed)
I put in the referral to sports medicine. Can you help coordinate with Judeen Hammans and Dr. Raeford Razor office to get pt in quickly.

## 2020-03-04 ENCOUNTER — Ambulatory Visit: Payer: Self-pay

## 2020-03-04 ENCOUNTER — Ambulatory Visit (INDEPENDENT_AMBULATORY_CARE_PROVIDER_SITE_OTHER): Payer: Medicare PPO | Admitting: Family Medicine

## 2020-03-04 ENCOUNTER — Other Ambulatory Visit: Payer: Self-pay

## 2020-03-04 ENCOUNTER — Encounter: Payer: Self-pay | Admitting: Family Medicine

## 2020-03-04 VITALS — BP 125/76 | HR 83 | Ht 66.0 in | Wt 160.0 lb

## 2020-03-04 DIAGNOSIS — M23204 Derangement of unspecified medial meniscus due to old tear or injury, left knee: Secondary | ICD-10-CM | POA: Diagnosis not present

## 2020-03-04 DIAGNOSIS — M1710 Unilateral primary osteoarthritis, unspecified knee: Secondary | ICD-10-CM | POA: Insufficient documentation

## 2020-03-04 DIAGNOSIS — M25562 Pain in left knee: Secondary | ICD-10-CM

## 2020-03-04 DIAGNOSIS — M179 Osteoarthritis of knee, unspecified: Secondary | ICD-10-CM | POA: Insufficient documentation

## 2020-03-04 MED ORDER — TRIAMCINOLONE ACETONIDE 40 MG/ML IJ SUSP
40.0000 mg | Freq: Once | INTRAMUSCULAR | Status: AC
  Administered 2020-03-04: 40 mg via INTRA_ARTICULAR

## 2020-03-04 NOTE — Progress Notes (Signed)
Cynthia [name] - 74 y.o. female MRN [id number]  Date of birth: [birth year]  SUBJECTIVE:  Including CC & ROS.  Chief Complaint  Patient presents with  . Knee Pain    left x 6 weeks    Cynthia [name] is a 74 y.o. female that is presenting with left knee pain.  The pain has been ongoing for over a month.  She initially felt the pain when she was moving.  She was turning and felt a pop in her knee.  Since that time she has had ongoing pain.  It seems to be staying the same.  No history of similar pain.  No surgeries..  Independent review of the left knee x-ray from 9/20 shows no acute abnormalities.   Review of Systems See HPI   HISTORY: Past Medical, Surgical, Social, and Family History Reviewed & Updated per EMR.   Pertinent Historical Findings include:  Past Medical History:  Diagnosis Date  . Allergy   . Anxiety   . Arthritis   . Blood transfusion without reported diagnosis   . Constipation, chronic   . DEGENERATIVE JOINT DISEASE 06/02/2010  . Depression   . Glaucoma 2014   Right eye  . Headache(784.0)   . Hyperlipidemia    hx of  . Osteopenia   . Psoriasis   . PUD (peptic ulcer disease) 2012   Upper GI bleed d/t NSAIDs  . RLS (restless legs syndrome)    robaxin prn    Past Surgical History:  Procedure Laterality Date  . ANKLE FRACTURE SURGERY Left    surgery 12-11 (LEFT)  . CESAREAN SECTION  1969    Family History  Problem Relation Age of Onset  . Aneurysm Mother        brain  . Celiac disease Brother   . Diabetes Maternal Grandfather   . Hypertension Neg Hx   . Breast cancer Neg Hx   . Coronary artery disease Neg Hx   . Colon cancer Neg Hx   . Esophageal cancer Neg Hx   . Stomach cancer Neg Hx   . Rectal cancer Neg Hx     Social History   Socioeconomic History  . Marital status: Widowed    Spouse name: Not on file  . Number of children: 1  . Years of education: Not on file  . Highest education level: Doctorate  Occupational History  .  Occupation: retired Environmental consultant: RETIRED  Tobacco Use  . Smoking status: Never Smoker  . Smokeless tobacco: Never Used  Vaping Use  . Vaping Use: Never used  Substance and Sexual Activity  . Alcohol use: Yes    Comment: socially  . Drug use: No  . Sexual activity: Not on file  Other Topics Concern  . Not on file  Social History Narrative   2 step children, 1 child , - patient and her husband, he recently stop driving        Lives in two story home      Pt is right handed   Social Determinants of Health   Financial Resource Strain:   . Difficulty of Paying Living Expenses: Not on file  Food Insecurity:   . Worried About Charity fundraiser in the Last Year: Not on file  . Ran Out of Food in the Last Year: Not on file  Transportation Needs:   . Lack of Transportation (Medical): Not on file  . Lack of Transportation (  Non-Medical): Not on file  Physical Activity:   . Days of Exercise per Week: Not on file  . Minutes of Exercise per Session: Not on file  Stress:   . Feeling of Stress : Not on file  Social Connections:   . Frequency of Communication with Friends and Family: Not on file  . Frequency of Social Gatherings with Friends and Family: Not on file  . Attends Religious Services: Not on file  . Active Member of Clubs or Organizations: Not on file  . Attends Archivist Meetings: Not on file  . Marital Status: Not on file  Intimate Partner Violence:   . Fear of Current or Ex-Partner: Not on file  . Emotionally Abused: Not on file  . Physically Abused: Not on file  . Sexually Abused: Not on file     PHYSICAL EXAM:  VS: BP 125/76   Pulse 83   Ht 5\' 6"  (1.676 m)   Wt 160 lb (72.6 kg)   BMI 25.82 kg/m  Physical Exam Gen: NAD, alert, cooperative with exam, well-appearing MSK:  Left knee:  Effusion noted. Normal range of motion. Tenderness to palpation of the medial joint space. Some instability with valgus stress  testing. Neurovascularly intact  Limited ultrasound: Left knee:  Moderate effusion suprapatellar pouch. Normal-appearing quadricep and patellar tendon. Normal-appearing medial joint space. Hypoechoic change at the insertion of the MCL. Significant hyperemia overlying the medial femoral condyle and at the knee origin of the MCL.  Summary: Moderate effusion with likely MCL sprain  Ultrasound and interpretation by Clearance Coots, MD  Aspiration/Injection Procedure Note Cynthia [name] Feb 10, 1946  Procedure: Aspiration and Injection Indications: left knee pain    Procedure Details Consent: Risks of procedure as well as the alternatives and risks of each were explained to the (patient/caregiver).  Consent for procedure obtained. Time Out: Verified patient identification, verified procedure, site/side was marked, verified correct patient position, special equipment/implants available, medications/allergies/relevent history reviewed, required imaging and test results available.  Performed.  The area was cleaned with iodine and alcohol swabs.    The left knee superior lateral suprapatellar pouch was injected using 4 cc of 1% lidocaine on a 25-gauge 1-1/2 inch needle.  An 18-gauge 1-1/2 inch needle was inserted to achieve aspiration.  The syringe was switched to mixture containing 1 cc's of 40 mg Kenalog and 4 cc's of 0.25% bupivacaine was injected.  Ultrasound was used. Images were obtained in long views showing the injection.    Amount of Fluid Aspirated: 78mL Character of Fluid: clear and straw colored Fluid was sent for:n/a  A sterile dressing was applied.  Patient did tolerate procedure well.    ASSESSMENT & PLAN:   Degenerative tear of medial meniscus of left knee Symptoms been ongoing for over a month.  It is more likely to be meniscal tear with an associated MCL sprain.  Large effusion on exam today. -Counseled on home exercise therapy and supportive care. -Aspiration and  injection. -Brace. -Follow-up in 4 weeks.  Can consider physical therapy.

## 2020-03-04 NOTE — Assessment & Plan Note (Signed)
Symptoms been ongoing for over a month.  It is more likely to be meniscal tear with an associated MCL sprain.  Large effusion on exam today. -Counseled on home exercise therapy and supportive care. -Aspiration and injection. -Brace. -Follow-up in 4 weeks.  Can consider physical therapy.

## 2020-03-04 NOTE — Patient Instructions (Signed)
Nice to meet you Please try ice as needed  Please try the brace Please try the exercises   Please send me a message in MyChart with any questions or updates.  Please see me back in 4 weeks.   --Dr. Raeford Razor

## 2020-03-10 ENCOUNTER — Other Ambulatory Visit: Payer: Self-pay | Admitting: Internal Medicine

## 2020-03-15 ENCOUNTER — Other Ambulatory Visit: Payer: Self-pay | Admitting: Internal Medicine

## 2020-03-18 ENCOUNTER — Encounter: Payer: Self-pay | Admitting: Family Medicine

## 2020-03-19 ENCOUNTER — Ambulatory Visit: Payer: Medicare PPO | Admitting: Internal Medicine

## 2020-03-19 ENCOUNTER — Encounter: Payer: Self-pay | Admitting: Internal Medicine

## 2020-03-19 ENCOUNTER — Other Ambulatory Visit: Payer: Self-pay

## 2020-03-19 VITALS — BP 118/76 | HR 78 | Temp 98.0°F | Resp 16 | Ht 66.0 in | Wt 166.0 lb

## 2020-03-19 DIAGNOSIS — M8628 Subacute osteomyelitis, other site: Secondary | ICD-10-CM

## 2020-03-19 DIAGNOSIS — M79674 Pain in right toe(s): Secondary | ICD-10-CM | POA: Diagnosis not present

## 2020-03-19 NOTE — Progress Notes (Signed)
Subjective:    Patient ID: Cynthia [name], female    DOB: May 25, 1945, 74 y.o.   MRN: [id number]  DOS:  03/19/2020 Type of visit - description: Acute About 4 months ago, she hurt the right fifth toe in a furniture. The toe is swollen and slightly red since. No major deformity noted. She has not taking any medication but immediately after the incident she ice the area and also buddy tape the toe for few weeks. She is here because she continue w/ pain when uses certain shoes.  Insomnia: On Ambien, trying to take it as little as possible.  Review of Systems See above   Past Medical History:  Diagnosis Date  . Allergy   . Anxiety   . Arthritis   . Blood transfusion without reported diagnosis   . Constipation, chronic   . DEGENERATIVE JOINT DISEASE 06/02/2010  . Depression   . Glaucoma 2014   Right eye  . Headache(784.0)   . Hyperlipidemia    hx of  . Osteopenia   . Psoriasis   . PUD (peptic ulcer disease) 2012   Upper GI bleed d/t NSAIDs  . RLS (restless legs syndrome)    robaxin prn    Past Surgical History:  Procedure Laterality Date  . ANKLE FRACTURE SURGERY Left    surgery 12-11 (LEFT)  . [place]    Allergies as of 03/19/2020      Reactions   Motrin [ibuprofen]    GI bleed   Pollen Extract    Statins    "delibitating pain all over body"   Sulfonamide Derivatives    Pt unsure of reaction   Ciprofloxacin Swelling, Rash      Medication List       Accurate as of March 19, 2020 11:59 PM. If you have any questions, ask your nurse or doctor.        buPROPion 300 MG 24 hr tablet Commonly known as: WELLBUTRIN XL Take 1 tablet (300 mg total) by mouth daily.   cetirizine 10 MG tablet Commonly known as: ZYRTEC Take 10 mg by mouth at bedtime as needed for allergies.   ezetimibe 10 MG tablet Commonly known as: ZETIA Take 1 tablet (10 mg total) by mouth daily.   methocarbamol 500 MG tablet Commonly known as: ROBAXIN Take 1 tablet (500 mg  total) by mouth 3 (three) times daily as needed for muscle spasms.   pantoprazole 40 MG tablet Commonly known as: PROTONIX Take 1 tablet (40 mg total) by mouth daily.   sertraline 100 MG tablet Commonly known as: ZOLOFT Take 1 tablet (100 mg total) by mouth daily. What changed: when to take this   traMADol 50 MG tablet Commonly known as: ULTRAM Take 1 tablet (50 mg total) by mouth every 6 (six) hours as needed.   Travoprost (BAK Free) 0.004 % Soln ophthalmic solution Commonly known as: TRAVATAN Place 1 drop into the right eye at bedtime.   valACYclovir 1000 MG tablet Commonly known as: Valtrex Take 1 tablet (1,000 mg total) by mouth 3 (three) times daily.   verapamil 180 MG CR tablet Commonly known as: CALAN-SR TAKE ONE TABLET BY MOUTH EVERY NIGHT AT BEDTIME   zolpidem 5 MG tablet Commonly known as: AMBIEN TAKE ONE TABLET BY MOUTH EVERY NIGHT AT BEDTIME AS NEEDED FOR SLEEP          Objective:   Physical Exam BP 118/76 (BP Location: Left Arm, Patient Position: Sitting, Cuff Size: Small)   Pulse  78   Temp 98 F (36.7 C) (Oral)   Resp 16   Ht 5\' 6"  (1.676 m)   Wt 166 lb (75.3 kg)   SpO2 99%   BMI 26.79 kg/m  General:   Well developed, NAD, BMI noted. HEENT:  Normocephalic . Face symmetric, atraumatic Feet: Normal except for the right fifth toe: Is a slightly swollen, tender distally, slightly red but not warm. No obvious deformities. Neurologic:  alert & oriented X3.  Speech normal, gait assisted by a cane Psych--  Cognition and judgment appear intact.  Cooperative with normal attention span and concentration.  Behavior appropriate. No anxious or depressed appearing.      Assessment     Assessment Hyperlipidemia: Lipitor causes myalgias, declined other statins, zetia didn't help Anxiety depression insomnia DJD Headaches Osteopenia: 07-2014 T score of -1.7, Rx   vitamin D PUD due to NSAIDs RLS - robaxin  prn Glaucoma Chronic constipation Skin  psoriasis , Dx LSC (lichen )  ~ 5872 , sees derm   PLAN: Injury, right fifth toe: Toe continues to hurt after 4 months of injury, this is slightly red but I doubt cellulitis. Plan: X-ray, refer to podiatry, the pain diminished with certain shoes, recommend to use only shoes that are comfortable. Anxiety, depression, insomnia: Lost her husband, just moved to [place] and is doing okay emotionally, to have some counseling. RTC in few weeks for CPX already scheduled.  This visit occurred during the SARS-CoV-2 public health emergency.  Safety protocols were in place, including screening questions prior to the visit, additional usage of staff PPE, and extensive cleaning of exam room while observing appropriate contact time as indicated for disinfecting solutions.

## 2020-03-19 NOTE — Progress Notes (Signed)
Pre visit review using our clinic review tool, if applicable. No additional management support is needed unless otherwise documented below in the visit note. 

## 2020-03-19 NOTE — Patient Instructions (Signed)
Please go to the first floor and get  the x-ray  We are referring you to a podiatrist  See you in few weeks for a physical exam

## 2020-03-20 NOTE — Assessment & Plan Note (Signed)
Injury, right fifth toe: Toe continues to hurt after 4 months of injury, this is slightly red but I doubt cellulitis. Plan: X-ray, refer to podiatry, the pain diminished with certain shoes, recommend to use only shoes that are comfortable. Anxiety, depression, insomnia: Lost her husband, just moved to [place] and is doing okay emotionally, to have some counseling. RTC in few weeks for CPX already scheduled.

## 2020-03-22 ENCOUNTER — Ambulatory Visit (INDEPENDENT_AMBULATORY_CARE_PROVIDER_SITE_OTHER): Payer: Medicare PPO | Admitting: Family Medicine

## 2020-03-22 ENCOUNTER — Ambulatory Visit (HOSPITAL_BASED_OUTPATIENT_CLINIC_OR_DEPARTMENT_OTHER)
Admission: RE | Admit: 2020-03-22 | Discharge: 2020-03-22 | Disposition: A | Discharge status: Home / Self Care | Payer: Medicare PPO | Source: Ambulatory Visit | Attending: Internal Medicine | Admitting: Internal Medicine

## 2020-03-22 ENCOUNTER — Encounter: Payer: Self-pay | Admitting: Family Medicine

## 2020-03-22 ENCOUNTER — Other Ambulatory Visit: Payer: Self-pay

## 2020-03-22 VITALS — BP 119/69 | HR 77 | Ht 64.0 in | Wt 160.0 lb

## 2020-03-22 DIAGNOSIS — M85852 Other specified disorders of bone density and structure, left thigh: Secondary | ICD-10-CM

## 2020-03-22 DIAGNOSIS — S90121A Contusion of right lesser toe(s) without damage to nail, initial encounter: Secondary | ICD-10-CM | POA: Diagnosis not present

## 2020-03-22 DIAGNOSIS — M79674 Pain in right toe(s): Secondary | ICD-10-CM | POA: Diagnosis not present

## 2020-03-22 DIAGNOSIS — M1712 Unilateral primary osteoarthritis, left knee: Secondary | ICD-10-CM | POA: Diagnosis not present

## 2020-03-22 NOTE — Progress Notes (Signed)
Cynthia [name] - 74 y.o. female MRN [id number]  Date of birth: 1945-09-22  SUBJECTIVE:  Including CC & ROS.  Chief Complaint  Patient presents with  . Follow-up    left knee    Cynthia [name] is a 74 y.o. female that is presenting with worsening of her left knee pain.  She received little improvement with the aspiration and injection.  She has most of pain over the medial joint space as well as rating down the tibia.  Has been using a cane to help with ambulation.   Review of Systems See HPI   HISTORY: Past Medical, Surgical, Social, and Family History Reviewed & Updated per EMR.   Pertinent Historical Findings include:  Past Medical History:  Diagnosis Date  . Allergy   . Anxiety   . Arthritis   . Blood transfusion without reported diagnosis   . Constipation, chronic   . DEGENERATIVE JOINT DISEASE 06/02/2010  . Depression   . Glaucoma 2014   Right eye  . Headache(784.0)   . Hyperlipidemia    hx of  . Osteopenia   . Psoriasis   . PUD (peptic ulcer disease) 2012   Upper GI bleed d/t NSAIDs  . RLS (restless legs syndrome)    robaxin prn    Past Surgical History:  Procedure Laterality Date  . ANKLE FRACTURE SURGERY Left    surgery 12-11 (LEFT)  . CESAREAN SECTION  1969    Family History  Problem Relation Age of Onset  . Aneurysm Mother        brain  . Celiac disease Brother   . Diabetes Maternal Grandfather   . Hypertension Neg Hx   . Breast cancer Neg Hx   . Coronary artery disease Neg Hx   . Colon cancer Neg Hx   . Esophageal cancer Neg Hx   . Stomach cancer Neg Hx   . Rectal cancer Neg Hx     Social History   Socioeconomic History  . Marital status: Widowed    Spouse name: Not on file  . Number of children: 1  . Years of education: Not on file  . Highest education level: Doctorate  Occupational History  . Occupation: retired Environmental consultant: RETIRED  Tobacco Use  . Smoking status: Never Smoker  . Smokeless tobacco: Never Used    Vaping Use  . Vaping Use: Never used  Substance and Sexual Activity  . Alcohol use: Yes    Comment: socially  . Drug use: No  . Sexual activity: Not on file  Other Topics Concern  . Not on file  Social History Narrative   2 step children, 1 child , 7 GK   Lost husband, moved [place] 02-13-2020       Lives in two story home      Pt is right handed   Social Determinants of Health   Financial Resource Strain:   . Difficulty of Paying Living Expenses: Not on file  Food Insecurity:   . Worried About Charity fundraiser in the Last Year: Not on file  . Ran Out of Food in the Last Year: Not on file  Transportation Needs:   . Lack of Transportation (Medical): Not on file  . Lack of Transportation (Non-Medical): Not on file  Physical Activity:   . Days of Exercise per Week: Not on file  . Minutes of Exercise per Session: Not on file  Stress:   . Feeling of Stress :  Not on file  Social Connections:   . Frequency of Communication with Friends and Family: Not on file  . Frequency of Social Gatherings with Friends and Family: Not on file  . Attends Religious Services: Not on file  . Active Member of Clubs or Organizations: Not on file  . Attends Archivist Meetings: Not on file  . Marital Status: Not on file  Intimate Partner Violence:   . Fear of Current or Ex-Partner: Not on file  . Emotionally Abused: Not on file  . Physically Abused: Not on file  . Sexually Abused: Not on file     PHYSICAL EXAM:  VS: BP 119/69   Pulse 77   Ht 5\' 4"  (1.626 m)   Wt 160 lb (72.6 kg)   BMI 27.46 kg/m  Physical Exam Gen: NAD, alert, cooperative with exam, well-appearing MSK:  Left knee: No obvious effusion. Tenderness palpation of the medial joint space. Normal strength resistance. Neurovascular intact     ASSESSMENT & PLAN:   Osteopenia Concern for insufficiency fracture in the right knee.  Does have osteopenia.  If insufficiency need to optimize bone  health. -Bone density.  OA (osteoarthritis) of knee Received little improvement with the aspiration and injection.  Concern of degenerative change versus insufficiency fracture of the knee. -Counseled on home exercise therapy and supportive care. -Referral to physical therapy. -Pursue gel injection. -Counseled on partial weightbearing on the left side. -Could consider further imaging.

## 2020-03-22 NOTE — Addendum Note (Signed)
Addended byDamita Dunnings D on: 03/22/2020 04:41 PM   Modules accepted: Orders

## 2020-03-22 NOTE — Patient Instructions (Addendum)
Good to see you Please try the volatren  Physical therapy will give you a call.  We'll inform you when the gel injection is in  Please try to take the weight off of the knee  I will call with the bone density results Please send me a message in MyChart with any questions or updates.  Please see you back as scheduled.   --Dr. Raeford Razor

## 2020-03-23 NOTE — Assessment & Plan Note (Addendum)
Received little improvement with the aspiration and injection.  Concern of degenerative change versus insufficiency fracture of the knee. -Counseled on home exercise therapy and supportive care. -Referral to physical therapy. -Pursue gel injection. -Counseled on partial weightbearing on the left side. -Could consider further imaging.

## 2020-03-23 NOTE — Assessment & Plan Note (Signed)
Concern for insufficiency fracture in the right knee.  Does have osteopenia.  If insufficiency need to optimize bone health. -Bone density.

## 2020-03-25 ENCOUNTER — Encounter: Payer: Self-pay | Admitting: Orthopedic Surgery

## 2020-03-25 ENCOUNTER — Ambulatory Visit: Payer: Medicare PPO | Admitting: Orthopedic Surgery

## 2020-03-25 VITALS — Ht 64.0 in | Wt 160.0 lb

## 2020-03-25 DIAGNOSIS — M869 Osteomyelitis, unspecified: Secondary | ICD-10-CM

## 2020-03-25 MED ORDER — DOXYCYCLINE HYCLATE 100 MG PO TABS: 100.0000 mg | ORAL_TABLET | Freq: Two times a day (BID) | ORAL | 0 refills | Status: DC

## 2020-03-25 NOTE — Progress Notes (Signed)
Office Visit Note   Patient: Cynthia [name]           Date of Birth: 1945/11/24           MRN: [id number] Visit Date: 03/25/2020              Requested by: Colon Branch, [address] [place],  [place] [address] PCP: Colon Branch, MD  Chief Complaint  Patient presents with  . Right Foot - Pain    Stubbed 5th toe 11/2019 painful since       HPI: Patient is a 74 year old woman who states that she stubbed her right little toe in July.  Patient states she buddy tape the toe felt like it was bruised there is no open wound or laceration.  Patient obtained x-rays several days ago and presents at this time for initial evaluation with concern for possible osteomyelitis.  Patient states the toe is red swollen and sore to the touch.  Assessment & Plan: Visit Diagnoses: No diagnosis found.  Plan: Recommended Levaquin but patient states that she has a reported allergy to Cipro but she is unsure what the allergy is we will start with doxycycline 100 mg twice a day follow-up in 3 weeks.  Follow-Up Instructions: No follow-ups on file.   Ortho Exam  Patient is alert, oriented, no adenopathy, well-dressed, normal affect, normal respiratory effort. Examination patient has a strong dorsalis pedis pulse she does have swelling and redness of the right little toe there is no open wounds no drainage no fluctuance no signs of abscess.  Review of the radiographs shows the destructive lytic lesion of the right little toe which appears to be more consistent with osteomyelitis than a nonunion.  Imaging: No results found. No images are attached to the encounter.  Labs: Lab Results  Component Value Date   HGBA1C 5.2 09/15/2019   ESRSEDRATE 10 12/27/2015   ESRSEDRATE 62 (H) 03/24/2015   ESRSEDRATE 12 09/14/2011   REPTSTATUS 10/25/2010 FINAL 10/24/2010   CULT NO GROWTH 10/24/2010   LABORGA NO GROWTH 07/05/2016     Lab Results  Component Value Date   ALBUMIN 4.3 09/14/2019   ALBUMIN  4.3 11/29/2017   ALBUMIN 4.3 11/22/2016    No results found for: MG Lab Results  Component Value Date   VD25OH 52 11/09/2009    No results found for: PREALBUMIN CBC EXTENDED Latest Ref Rng & Units 09/15/2019 09/14/2019 11/29/2017  WBC 4.0 - 10.5 K/uL 5.7 7.6 5.5  RBC 3.87 - 5.11 MIL/uL 3.93 4.03 4.06  HGB 12.0 - 15.0 g/dL 12.2 12.7 13.0  HCT 36 - 46 % 36.7 37.5 38.1  PLT 150 - 400 K/uL 331 343 306.0  NEUTROABS 1.7 - 7.7 K/uL - 4.0 3.0  LYMPHSABS 0.7 - 4.0 K/uL - 1.9 1.3     Body mass index is 27.46 kg/m.  Orders:  No orders of the defined types were placed in this encounter.  No orders of the defined types were placed in this encounter.    Procedures: No procedures performed  Clinical Data: No additional findings.  ROS:  All other systems negative, except as noted in the HPI. Review of Systems  Objective: Vital Signs: Ht 5\' 4"  (1.626 m)   Wt 160 lb (72.6 kg)   BMI 27.46 kg/m   Specialty Comments:  No specialty comments available.  PMFS History: Patient Active Problem List   Diagnosis Date Noted  . OA (osteoarthritis) of knee 03/04/2020  .  Irritant contact dermatitis due to plants, except food 12/19/2019  . Psoriasis 12/19/2019  . TIA (transient ischemic attack) 09/14/2019  . Insomnia 05/19/2019  . Gastroesophageal reflux disease without esophagitis 05/02/2019  . Essential tremor 07/25/2017  . Lichen 35/46/5681  . PCP NOTES >>>>>>>>>>>>>>>>>>>>>>>>>>>>>>>>>>> 03/25/2015  . H/O adenomatous polyp of colon 05/18/2014  . Annual physical exam 03/13/2011  . Osteopenia   . Osteoarthritis 06/02/2010  . Hyperlipidemia 11/09/2009  . Migraine headache 06/28/2009  . CONSTIPATION, CHRONIC 02/01/2007  . Anxiety and depression 08/17/2006  . ALLERGIC RHINITIS 08/17/2006   Past Medical History:  Diagnosis Date  . Allergy   . Anxiety   . Arthritis   . Blood transfusion without reported diagnosis   . Constipation, chronic   . DEGENERATIVE JOINT DISEASE 06/02/2010   . Depression   . Glaucoma 2014   Right eye  . Headache(784.0)   . Hyperlipidemia    hx of  . Osteopenia   . Psoriasis   . PUD (peptic ulcer disease) 2012   Upper GI bleed d/t NSAIDs  . RLS (restless legs syndrome)    robaxin prn    Family History  Problem Relation Age of Onset  . Aneurysm Mother        brain  . Celiac disease Brother   . Diabetes Maternal Grandfather   . Hypertension Neg Hx   . Breast cancer Neg Hx   . Coronary artery disease Neg Hx   . Colon cancer Neg Hx   . Esophageal cancer Neg Hx   . Stomach cancer Neg Hx   . Rectal cancer Neg Hx     Past Surgical History:  Procedure Laterality Date  . ANKLE FRACTURE SURGERY Left    surgery 12-11 (LEFT)  . [place]   Social History   Occupational History  . Occupation: retired Environmental consultant: RETIRED  Tobacco Use  . Smoking status: Never Smoker  . Smokeless tobacco: Never Used  Vaping Use  . Vaping Use: Never used  Substance and Sexual Activity  . Alcohol use: Yes    Comment: socially  . Drug use: No  . Sexual activity: Not on file

## 2020-03-26 ENCOUNTER — Ambulatory Visit (HOSPITAL_BASED_OUTPATIENT_CLINIC_OR_DEPARTMENT_OTHER)
Admission: RE | Admit: 2020-03-26 | Discharge: 2020-03-26 | Disposition: A | Discharge status: Home / Self Care | Payer: Medicare PPO | Source: Ambulatory Visit | Attending: Family Medicine | Admitting: Family Medicine

## 2020-03-26 ENCOUNTER — Other Ambulatory Visit: Payer: Self-pay

## 2020-03-26 DIAGNOSIS — M85852 Other specified disorders of bone density and structure, left thigh: Secondary | ICD-10-CM | POA: Insufficient documentation

## 2020-03-26 DIAGNOSIS — Z8739 Personal history of other diseases of the musculoskeletal system and connective tissue: Secondary | ICD-10-CM | POA: Diagnosis not present

## 2020-03-29 ENCOUNTER — Ambulatory Visit: Payer: Self-pay | Admitting: Podiatry

## 2020-03-31 ENCOUNTER — Telehealth: Payer: Self-pay | Admitting: Family Medicine

## 2020-03-31 NOTE — Telephone Encounter (Signed)
Informed of her bone density. Has changed to osteoporosis. We will discuss treatment on next follow up.   Rosemarie Ax, MD [facility] 03/31/2020, 1:39 PM

## 2020-04-05 ENCOUNTER — Telehealth: Payer: Self-pay | Admitting: Family Medicine

## 2020-04-05 ENCOUNTER — Telehealth: Payer: Self-pay | Admitting: Internal Medicine

## 2020-04-05 DIAGNOSIS — M1990 Unspecified osteoarthritis, unspecified site: Secondary | ICD-10-CM

## 2020-04-05 NOTE — Telephone Encounter (Signed)
DJD- back pain. Referral placed.

## 2020-04-05 NOTE — Telephone Encounter (Signed)
Please advise 

## 2020-04-05 NOTE — Telephone Encounter (Signed)
Patient states she would like an order sent to [facility] for physical therapy. Patient states she can receive those services at her assisted living center.  [place]  Fax Number : [phone] Please Advise

## 2020-04-05 NOTE — Telephone Encounter (Signed)
Hayley from [facility] (sp)says a injection application was sent for pt,but its missing some vital information & there are som missing/incorrect CPT codes that need correction 20610 & or 20611 (missing Dx coding).  ---Their office request Nevin Bloodgood call them @ [phone]  --glh

## 2020-04-05 NOTE — Telephone Encounter (Signed)
Spoke w/ Pt- informed that referral has been placed to home health PT at [facility]. Pt verbalized understanding.

## 2020-04-05 NOTE — Telephone Encounter (Signed)
Is okay but needs to let us know what issue she has that needs PT

## 2020-04-12 ENCOUNTER — Encounter: Payer: Self-pay | Admitting: Family Medicine

## 2020-04-12 ENCOUNTER — Other Ambulatory Visit: Payer: Self-pay

## 2020-04-12 ENCOUNTER — Ambulatory Visit: Payer: Medicare PPO | Admitting: Family Medicine

## 2020-04-12 VITALS — BP 115/76 | HR 89 | Ht 65.0 in | Wt 160.0 lb

## 2020-04-12 DIAGNOSIS — R937 Abnormal findings on diagnostic imaging of other parts of musculoskeletal system: Secondary | ICD-10-CM | POA: Insufficient documentation

## 2020-04-12 DIAGNOSIS — M816 Localized osteoporosis [Lequesne]: Secondary | ICD-10-CM | POA: Diagnosis not present

## 2020-04-12 DIAGNOSIS — M84453A Pathological fracture, unspecified femur, initial encounter for fracture: Secondary | ICD-10-CM | POA: Diagnosis not present

## 2020-04-12 NOTE — Assessment & Plan Note (Signed)
Most recent bone density was revealing for osteoporosis. -Prolia.

## 2020-04-12 NOTE — Patient Instructions (Signed)
Good to see you We will call you when the gel injection  They will call to setup the MRI  We will call to set up the prolia injection   Please send me a message in MyChart with any questions or updates.  We will setup a virtual visit once the MRI is resulted.   --Dr. Raeford Razor

## 2020-04-12 NOTE — Progress Notes (Signed)
Cynthia [name] - 74 y.o. female MRN [id number]  Date of birth: 1945-06-04  SUBJECTIVE:  Including CC & ROS.  Chief Complaint  Patient presents with  . Follow-up    right knee    Cynthia [name] is a 74 y.o. female that is presenting with worsening of her left knee pain.  Has pain with walking.  Is occurring on the medial aspect of the knee.  Still having to use a cane.   Review of Systems See HPI   HISTORY: Past Medical, Surgical, Social, and Family History Reviewed & Updated per EMR.   Pertinent Historical Findings include:  Past Medical History:  Diagnosis Date  . Allergy   . Anxiety   . Arthritis   . Blood transfusion without reported diagnosis   . Constipation, chronic   . DEGENERATIVE JOINT DISEASE 06/02/2010  . Depression   . Glaucoma 2014   Right eye  . Headache(784.0)   . Hyperlipidemia    hx of  . Osteopenia   . Psoriasis   . PUD (peptic ulcer disease) 2012   Upper GI bleed d/t NSAIDs  . RLS (restless legs syndrome)    robaxin prn    Past Surgical History:  Procedure Laterality Date  . ANKLE FRACTURE SURGERY Left    surgery 12-11 (LEFT)  . CESAREAN SECTION  1969    Family History  Problem Relation Age of Onset  . Aneurysm Mother        brain  . Celiac disease Brother   . Diabetes Maternal Grandfather   . Hypertension Neg Hx   . Breast cancer Neg Hx   . Coronary artery disease Neg Hx   . Colon cancer Neg Hx   . Esophageal cancer Neg Hx   . Stomach cancer Neg Hx   . Rectal cancer Neg Hx     Social History   Socioeconomic History  . Marital status: Widowed    Spouse name: Not on file  . Number of children: 1  . Years of education: Not on file  . Highest education level: Doctorate  Occupational History  . Occupation: retired Environmental consultant: RETIRED  Tobacco Use  . Smoking status: Never Smoker  . Smokeless tobacco: Never Used  Vaping Use  . Vaping Use: Never used  Substance and Sexual Activity  . Alcohol use: Yes     Comment: socially  . Drug use: No  . Sexual activity: Not on file  Other Topics Concern  . Not on file  Social History Narrative   2 step children, 1 child , 7 GK   Lost husband, moved [place] 02-13-2020       Lives in two story home      Pt is right handed   Social Determinants of Health   Financial Resource Strain:   . Difficulty of Paying Living Expenses: Not on file  Food Insecurity:   . Worried About Charity fundraiser in the Last Year: Not on file  . Ran Out of Food in the Last Year: Not on file  Transportation Needs:   . Lack of Transportation (Medical): Not on file  . Lack of Transportation (Non-Medical): Not on file  Physical Activity:   . Days of Exercise per Week: Not on file  . Minutes of Exercise per Session: Not on file  Stress:   . Feeling of Stress : Not on file  Social Connections:   . Frequency of Communication with Friends and Family:  Not on file  . Frequency of Social Gatherings with Friends and Family: Not on file  . Attends Religious Services: Not on file  . Active Member of Clubs or Organizations: Not on file  . Attends Archivist Meetings: Not on file  . Marital Status: Not on file  Intimate Partner Violence:   . Fear of Current or Ex-Partner: Not on file  . Emotionally Abused: Not on file  . Physically Abused: Not on file  . Sexually Abused: Not on file     PHYSICAL EXAM:  VS: BP 115/76   Pulse 89   Ht 5\' 5"  (1.651 m)   Wt 160 lb (72.6 kg)   BMI 26.63 kg/m  Physical Exam Gen: NAD, alert, cooperative with exam, well-appearing    ASSESSMENT & PLAN:   Osteoporosis Most recent bone density was revealing for osteoporosis. -Prolia.  Insufficiency fracture of medial condyle of femur ([place]) Concern for insufficiency fracture given ongoing pain despite steroid injection and bone density revealing for osteoporosis. -Counseled on home exercise therapy and supportive care partial weightbearing. - will get started with physical  therapy. -MRI to evaluate for insufficiency fracture.

## 2020-04-12 NOTE — Assessment & Plan Note (Signed)
Concern for insufficiency fracture given ongoing pain despite steroid injection and bone density revealing for osteoporosis. -Counseled on home exercise therapy and supportive care partial weightbearing. - will get started with physical therapy. -MRI to evaluate for insufficiency fracture.

## 2020-04-14 ENCOUNTER — Ambulatory Visit: Payer: Medicare PPO | Admitting: Physical Therapy

## 2020-04-15 ENCOUNTER — Ambulatory Visit: Payer: Medicare PPO | Admitting: Physician Assistant

## 2020-04-15 ENCOUNTER — Encounter: Payer: Self-pay | Admitting: Orthopedic Surgery

## 2020-04-15 DIAGNOSIS — M869 Osteomyelitis, unspecified: Secondary | ICD-10-CM

## 2020-04-15 NOTE — Progress Notes (Signed)
Office Visit Note   Patient: Cynthia [name]           Date of Birth: April 18, 1946           MRN: [id number] Visit Date: 04/15/2020              Requested by: Colon Branch, [address] [place],  [place] [address] PCP: Colon Branch, MD  Chief Complaint  Patient presents with  . Right Foot - Follow-up      HPI: Patient presents today follow-up for her right foot fifth toe.  She stubbed it early in July and continued to have erythema and redness.  She was last seen by [place] 3 weeks ago and concerns for infection.  She has been on doxycycline and feels she has had some improvement.  She is still however concerned because it has been so long  Assessment & Plan: Visit Diagnoses: No diagnosis found.  Plan: Patient has another week and a half on the doxycycline.  She will complete this course and then follow-up with [place] a week later.  Of course if she has any return of the severe redness and swelling she is to contact us immediately  Follow-Up Instructions: No follow-ups on file.   Ortho Exam  Patient is alert, oriented, no adenopathy, well-dressed, normal affect, normal respiratory effort. Focused examination mild erythema on the distal aspect of the toe she does have some soft tissue swelling compared to the other toe but no ascending cellulitis no swelling intact extending into her foot.  She also has some callusing on her left great toe with a small abrasion with just a spot of erythema.  Again no ascending cellulitis  Imaging: No results found. No images are attached to the encounter.  Labs: Lab Results  Component Value Date   HGBA1C 5.2 09/15/2019   ESRSEDRATE 10 12/27/2015   ESRSEDRATE 62 (H) 03/24/2015   ESRSEDRATE 12 09/14/2011   REPTSTATUS 10/25/2010 FINAL 10/24/2010   CULT NO GROWTH 10/24/2010   LABORGA NO GROWTH 07/05/2016     Lab Results  Component Value Date   ALBUMIN 4.3 09/14/2019   ALBUMIN 4.3 11/29/2017   ALBUMIN 4.3 11/22/2016    No  results found for: MG Lab Results  Component Value Date   VD25OH 52 11/09/2009    No results found for: PREALBUMIN CBC EXTENDED Latest Ref Rng & Units 09/15/2019 09/14/2019 11/29/2017  WBC 4.0 - 10.5 K/uL 5.7 7.6 5.5  RBC 3.87 - 5.11 MIL/uL 3.93 4.03 4.06  HGB 12.0 - 15.0 g/dL 12.2 12.7 13.0  HCT 36 - 46 % 36.7 37.5 38.1  PLT 150 - 400 K/uL 331 343 306.0  NEUTROABS 1.7 - 7.7 K/uL - 4.0 3.0  LYMPHSABS 0.7 - 4.0 K/uL - 1.9 1.3     There is no height or weight on file to calculate BMI.  Orders:  No orders of the defined types were placed in this encounter.  No orders of the defined types were placed in this encounter.    Procedures: No procedures performed  Clinical Data: No additional findings.  ROS:  All other systems negative, except as noted in the HPI. Review of Systems  Objective: Vital Signs: There were no vitals taken for this visit.  Specialty Comments:  No specialty comments available.  PMFS History: Patient Active Problem List   Diagnosis Date Noted  . Insufficiency fracture of medial condyle of femur ([place]) 04/12/2020  . OA (osteoarthritis) of knee 03/04/2020  .  Irritant contact dermatitis due to plants, except food 12/19/2019  . Psoriasis 12/19/2019  . TIA (transient ischemic attack) 09/14/2019  . Insomnia 05/19/2019  . Gastroesophageal reflux disease without esophagitis 05/02/2019  . Essential tremor 07/25/2017  . Lichen 03/04/1172  . PCP NOTES >>>>>>>>>>>>>>>>>>>>>>>>>>>>>>>>>>> 03/25/2015  . H/O adenomatous polyp of colon 05/18/2014  . Annual physical exam 03/13/2011  . Osteoporosis   . Osteoarthritis 06/02/2010  . Hyperlipidemia 11/09/2009  . Migraine headache 06/28/2009  . CONSTIPATION, CHRONIC 02/01/2007  . Anxiety and depression 08/17/2006  . ALLERGIC RHINITIS 08/17/2006   Past Medical History:  Diagnosis Date  . Allergy   . Anxiety   . Arthritis   . Blood transfusion without reported diagnosis   . Constipation, chronic   .  DEGENERATIVE JOINT DISEASE 06/02/2010  . Depression   . Glaucoma 2014   Right eye  . Headache(784.0)   . Hyperlipidemia    hx of  . Osteopenia   . Psoriasis   . PUD (peptic ulcer disease) 2012   Upper GI bleed d/t NSAIDs  . RLS (restless legs syndrome)    robaxin prn    Family History  Problem Relation Age of Onset  . Aneurysm Mother        brain  . Celiac disease Brother   . Diabetes Maternal Grandfather   . Hypertension Neg Hx   . Breast cancer Neg Hx   . Coronary artery disease Neg Hx   . Colon cancer Neg Hx   . Esophageal cancer Neg Hx   . Stomach cancer Neg Hx   . Rectal cancer Neg Hx     Past Surgical History:  Procedure Laterality Date  . ANKLE FRACTURE SURGERY Left    surgery 12-11 (LEFT)  . [place]   Social History   Occupational History  . Occupation: retired Environmental consultant: RETIRED  Tobacco Use  . Smoking status: Never Smoker  . Smokeless tobacco: Never Used  Vaping Use  . Vaping Use: Never used  Substance and Sexual Activity  . Alcohol use: Yes    Comment: socially  . Drug use: No  . Sexual activity: Not on file

## 2020-04-15 NOTE — Telephone Encounter (Signed)
Patient states [facility] does not got the referral. Can you please fax again to [phone]

## 2020-04-16 NOTE — Telephone Encounter (Signed)
Printed referral. Re-faxed to [facility].

## 2020-04-17 ENCOUNTER — Other Ambulatory Visit: Payer: Self-pay

## 2020-04-17 ENCOUNTER — Ambulatory Visit (INDEPENDENT_AMBULATORY_CARE_PROVIDER_SITE_OTHER): Payer: Medicare PPO

## 2020-04-17 DIAGNOSIS — S83282A Other tear of lateral meniscus, current injury, left knee, initial encounter: Secondary | ICD-10-CM | POA: Diagnosis not present

## 2020-04-17 DIAGNOSIS — M84453A Pathological fracture, unspecified femur, initial encounter for fracture: Secondary | ICD-10-CM

## 2020-04-19 ENCOUNTER — Encounter: Payer: Self-pay | Admitting: Family Medicine

## 2020-04-21 DIAGNOSIS — S83282S Other tear of lateral meniscus, current injury, left knee, sequela: Secondary | ICD-10-CM | POA: Diagnosis not present

## 2020-04-21 DIAGNOSIS — M6281 Muscle weakness (generalized): Secondary | ICD-10-CM | POA: Diagnosis not present

## 2020-04-21 DIAGNOSIS — R2689 Other abnormalities of gait and mobility: Secondary | ICD-10-CM | POA: Diagnosis not present

## 2020-04-21 DIAGNOSIS — M25562 Pain in left knee: Secondary | ICD-10-CM | POA: Diagnosis not present

## 2020-04-21 DIAGNOSIS — M1990 Unspecified osteoarthritis, unspecified site: Secondary | ICD-10-CM | POA: Diagnosis not present

## 2020-04-26 ENCOUNTER — Ambulatory Visit (INDEPENDENT_AMBULATORY_CARE_PROVIDER_SITE_OTHER): Payer: Medicare PPO | Admitting: Internal Medicine

## 2020-04-26 ENCOUNTER — Encounter: Payer: Self-pay | Admitting: Internal Medicine

## 2020-04-26 ENCOUNTER — Other Ambulatory Visit: Payer: Self-pay

## 2020-04-26 VITALS — BP 124/82 | HR 99 | Temp 98.1°F | Ht 65.0 in | Wt 163.0 lb

## 2020-04-26 DIAGNOSIS — H9313 Tinnitus, bilateral: Secondary | ICD-10-CM | POA: Diagnosis not present

## 2020-04-26 DIAGNOSIS — Z23 Encounter for immunization: Secondary | ICD-10-CM

## 2020-04-26 DIAGNOSIS — E785 Hyperlipidemia, unspecified: Secondary | ICD-10-CM | POA: Diagnosis not present

## 2020-04-26 DIAGNOSIS — M816 Localized osteoporosis [Lequesne]: Secondary | ICD-10-CM

## 2020-04-26 DIAGNOSIS — G4700 Insomnia, unspecified: Secondary | ICD-10-CM

## 2020-04-26 DIAGNOSIS — F32A Depression, unspecified: Secondary | ICD-10-CM | POA: Diagnosis not present

## 2020-04-26 DIAGNOSIS — S99922D Unspecified injury of left foot, subsequent encounter: Secondary | ICD-10-CM | POA: Diagnosis not present

## 2020-04-26 DIAGNOSIS — F419 Anxiety disorder, unspecified: Secondary | ICD-10-CM | POA: Diagnosis not present

## 2020-04-26 DIAGNOSIS — Z0000 Encounter for general adult medical examination without abnormal findings: Secondary | ICD-10-CM | POA: Diagnosis not present

## 2020-04-26 NOTE — Assessment & Plan Note (Addendum)
-  Tdap 2014 - PNM 23 2011, 04/26/2020;prevnar: 2016 - completed zostavax and shingrix - covid vax x 3 -Had a flu shot -CCS: Colonoscopy: 05/15/2002, cscope 8-15, 1 polyp.  Due for colonoscopy, pro/cons d/w pt, we agreed on GI referral --No further  PAPs, see previous entries  - last MMG 02/2020 ( [place]) -Diet-exercise discussed -Labs reviewed, no due for any blood work

## 2020-04-26 NOTE — Progress Notes (Signed)
Subjective:    Patient ID: Cynthia [name], female    DOB: 1945-12-17, 74 y.o.   MRN: [id number]  DOS:  04/26/2020 Type of visit - description: CPE In addition to CPX she has multiple concerns. She recently had an MRI with left meniscus tear. A recent DEXA showed osteoporosis, okay to take Prolia? Few months ago, during a pedicure, the left great toe was nicked and has not completely healed. Has hip pain on and off, on Tylenol   Review of Systems  Other than above, a 14 point review of systems is negative      Past Medical History:  Diagnosis Date  . Allergy   . Anxiety   . Arthritis   . Blood transfusion without reported diagnosis   . Constipation, chronic   . DEGENERATIVE JOINT DISEASE 06/02/2010  . Depression   . Glaucoma 2014   Right eye  . Headache(784.0)   . Hyperlipidemia    hx of  . Osteopenia   . Psoriasis   . PUD (peptic ulcer disease) 2012   Upper GI bleed d/t NSAIDs  . RLS (restless legs syndrome)    robaxin prn    Past Surgical History:  Procedure Laterality Date  . ANKLE FRACTURE SURGERY Left    surgery 12-11 (LEFT)  . [place]    Allergies as of 04/26/2020      Reactions   Motrin [ibuprofen]    GI bleed   Pollen Extract    Statins    "delibitating pain all over body"   Sulfonamide Derivatives    Pt unsure of reaction   Ciprofloxacin Swelling, Rash      Medication List       Accurate as of April 26, 2020  6:08 PM. If you have any questions, ask your nurse or doctor.        STOP taking these medications   doxycycline 100 MG tablet Commonly known as: VIBRA-TABS Stopped by: Kathlene November, MD   zolpidem 5 MG tablet Commonly known as: AMBIEN Stopped by: Kathlene November, MD     TAKE these medications   buPROPion 300 MG 24 hr tablet Commonly known as: WELLBUTRIN XL Take 1 tablet (300 mg total) by mouth daily.   cetirizine 10 MG tablet Commonly known as: ZYRTEC Take 10 mg by mouth at bedtime as needed for allergies.    ezetimibe 10 MG tablet Commonly known as: ZETIA Take 1 tablet (10 mg total) by mouth daily.   methocarbamol 500 MG tablet Commonly known as: ROBAXIN Take 1 tablet (500 mg total) by mouth 3 (three) times daily as needed for muscle spasms.   pantoprazole 40 MG tablet Commonly known as: PROTONIX Take 1 tablet (40 mg total) by mouth daily.   sertraline 100 MG tablet Commonly known as: ZOLOFT Take 1 tablet (100 mg total) by mouth daily. What changed: when to take this   traMADol 50 MG tablet Commonly known as: ULTRAM Take 1 tablet (50 mg total) by mouth every 6 (six) hours as needed.   Travoprost (BAK Free) 0.004 % Soln ophthalmic solution Commonly known as: TRAVATAN Place 1 drop into the right eye at bedtime.   verapamil 180 MG CR tablet Commonly known as: CALAN-SR TAKE ONE TABLET BY MOUTH EVERY NIGHT AT BEDTIME          Objective:   Physical Exam BP 124/82 (BP Location: Right Arm, Patient Position: Sitting, Cuff Size: Large)   Pulse 99   Temp 98.1 F (36.7 C) (Oral)  Ht 5\' 5"  (1.651 m)   Wt 163 lb (73.9 kg)   SpO2 99%   BMI 27.12 kg/m  General: Well developed, NAD, BMI noted Neck: No  thyromegaly  HEENT:  Normocephalic . Face symmetric, atraumatic Lungs:  CTA B Normal respiratory effort, no intercostal retractions, no accessory muscle use. Heart: RRR,  no murmur.  Abdomen:  Not distended, soft, non-tender. No rebound or rigidity.   Lower extremities: no pretibial edema bilaterally  Skin:  Left big toe: The area where she had a cut few months ago is now slightly darker than the rest, no evidence of infection. Neurologic:  alert & oriented X3.  Speech normal, gait assisted by a cane Strength symmetric and appropriate for age.  Psych: Cognition and judgment appear intact.  Cooperative with normal attention span and concentration.  Behavior appropriate. No anxious or depressed appearing.     Assessment    Assessment Hyperlipidemia: Lipitor causes  myalgias, declined other statins Anxiety depression insomnia DJD Headaches: verapamil-tramadol Osteoporosis: 07-2014 T score of -1.7, T score (November 2021) -2.8, not on calcium (constipation) on  vitamin D PUD due to NSAIDs RLS - robaxin  prn Glaucoma Chronic constipation Skin psoriasis , Dx LSC (lichen )  ~ 1610 , sees derm   PLAN: Here for CPX Hyperlipidemia: Started Zetia few months ago, great response, no change Anxiety, depression, insomnia: Well-controlled on sertraline and Wellbutrin.  Does not need Ambien anymore. DJD: Controlled on Tylenol Osteoporosis: Last T score 03-2020 was -2.8, sports medicine Rx Prolia.  Patient has some concern about that medication but I explained that it is a excellent medicine if she has side effects she could stop taking it. Headaches: Well-controlled on verapamil preventive and tramadol for acute episodes which she takes rarely. Left meniscal tear by recent MRI, she asked my opinion: Rec a conservative approach and only proceed with more aggressive treatment (scope) if conservative treatment fails. Injury, right fifth toe: Was eventually diagnosed with osteomyelitis, had antibiotics, symptoms resolved Left great toe: See HPI, has a small scar, recommend observation PVD?  A commercial screening showed signs of PVD back in 2020, she is asx f rom that standpoint, I do not believe further evaluation is warranted at this time. Tinnitus: She also reports tinnitus, bilateral, no associated HOH, we agreed on observation. RTC 6 months  In addition to CPX, she had multiple concerns and questions about tinnitus, left great toe, right fifth toe, meniscal tear, etc.  They were answered to the best of my ability. We also addressed her chronic medical issues.   This visit occurred during the SARS-CoV-2 public health emergency.  Safety protocols were in place, including screening questions prior to the visit, additional usage of staff PPE, and extensive cleaning  of exam room while observing appropriate contact time as indicated for disinfecting solutions.

## 2020-04-26 NOTE — Patient Instructions (Signed)
   GO TO THE FRONT DESK, PLEASE SCHEDULE YOUR APPOINTMENTS Come back for  a check up in 6 months  

## 2020-04-26 NOTE — Assessment & Plan Note (Signed)
Here for CPX Hyperlipidemia: Started Zetia few months ago, great response, no change Anxiety, depression, insomnia: Well-controlled on sertraline and Wellbutrin.  Does not need Ambien anymore. DJD: Controlled on Tylenol Osteoporosis: Last T score 03-2020 was -2.8, sports medicine Rx Prolia.  Patient has some concern about that medication but I explained that it is a excellent medicine if she has side effects she could stop taking it. Headaches: Well-controlled on verapamil preventive and tramadol for acute episodes which she takes rarely. Left meniscal tear by recent MRI, she asked my opinion: Rec a conservative approach and only proceed with more aggressive treatment (scope) if conservative treatment fails. Injury, right fifth toe: Was eventually diagnosed with osteomyelitis, had antibiotics, symptoms resolved Left great toe: See HPI, has a small scar, recommend observation PVD?  A commercial screening showed signs of PVD back in 2020, she is asx f rom that standpoint, I do not believe further evaluation is warranted at this time. Tinnitus: She also reports tinnitus, bilateral, no associated HOH, we agreed on observation. RTC 6 months

## 2020-04-27 ENCOUNTER — Telehealth (INDEPENDENT_AMBULATORY_CARE_PROVIDER_SITE_OTHER): Payer: Medicare PPO | Admitting: Family Medicine

## 2020-04-27 DIAGNOSIS — R937 Abnormal findings on diagnostic imaging of other parts of musculoskeletal system: Secondary | ICD-10-CM | POA: Diagnosis not present

## 2020-04-27 NOTE — Assessment & Plan Note (Signed)
Has changes in the lateral compartment with reactive edema likely related to the degenerative changes.  -Counseled on home exercise therapy and supportive care. -Gel injections. -Continue physical therapy. -Prolia.

## 2020-04-27 NOTE — Progress Notes (Signed)
Virtual Visit via Telephone Note  I connected with Cynthia [name] on 04/27/20 at  8:00 AM EST by telephone and verified that I am speaking with the correct person using two identifiers.  Location: Patient: home Provider: office   I discussed the limitations, risks, security and privacy concerns of performing an evaluation and management service by telephone and the availability of in person appointments. I also discussed with the patient that there may be a patient responsible charge related to this service. The patient expressed understanding and agreed to proceed.   History of Present Illness:  Cynthia [name] is a 74 yo F following up after her MRI of the left knee.  MRI was showing degenerative changes of the meniscus and also showing subchondral reactive marrow in the lateral femoral-tibial compartment.   Observations/Objective:   Assessment and Plan:  Bone marrow edema: Has changes in the lateral compartment with reactive edema likely related to the degenerative changes.  -Counseled on home exercise therapy and supportive care. -Gel injections. -Continue physical therapy. -Prolia.   Follow Up Instructions:    I discussed the assessment and treatment plan with the patient. The patient was provided an opportunity to ask questions and all were answered. The patient agreed with the plan and demonstrated an understanding of the instructions.   The patient was advised to call back or seek an in-person evaluation if the symptoms worsen or if the condition fails to improve as anticipated.  I provided 5 minutes of non-face-to-face time during this encounter.   Clearance Coots, MD

## 2020-04-28 DIAGNOSIS — M6281 Muscle weakness (generalized): Secondary | ICD-10-CM | POA: Diagnosis not present

## 2020-04-28 DIAGNOSIS — R2689 Other abnormalities of gait and mobility: Secondary | ICD-10-CM | POA: Diagnosis not present

## 2020-04-28 DIAGNOSIS — M1990 Unspecified osteoarthritis, unspecified site: Secondary | ICD-10-CM | POA: Diagnosis not present

## 2020-04-28 DIAGNOSIS — M25562 Pain in left knee: Secondary | ICD-10-CM | POA: Diagnosis not present

## 2020-04-28 DIAGNOSIS — S83282S Other tear of lateral meniscus, current injury, left knee, sequela: Secondary | ICD-10-CM | POA: Diagnosis not present

## 2020-04-30 DIAGNOSIS — M6281 Muscle weakness (generalized): Secondary | ICD-10-CM | POA: Diagnosis not present

## 2020-04-30 DIAGNOSIS — M25562 Pain in left knee: Secondary | ICD-10-CM | POA: Diagnosis not present

## 2020-04-30 DIAGNOSIS — M1990 Unspecified osteoarthritis, unspecified site: Secondary | ICD-10-CM | POA: Diagnosis not present

## 2020-04-30 DIAGNOSIS — R2689 Other abnormalities of gait and mobility: Secondary | ICD-10-CM | POA: Diagnosis not present

## 2020-04-30 DIAGNOSIS — S83282S Other tear of lateral meniscus, current injury, left knee, sequela: Secondary | ICD-10-CM | POA: Diagnosis not present

## 2020-05-05 ENCOUNTER — Ambulatory Visit: Payer: Medicare PPO | Admitting: Physician Assistant

## 2020-05-05 DIAGNOSIS — R2689 Other abnormalities of gait and mobility: Secondary | ICD-10-CM | POA: Diagnosis not present

## 2020-05-05 DIAGNOSIS — M25562 Pain in left knee: Secondary | ICD-10-CM | POA: Diagnosis not present

## 2020-05-05 DIAGNOSIS — M1990 Unspecified osteoarthritis, unspecified site: Secondary | ICD-10-CM | POA: Diagnosis not present

## 2020-05-05 DIAGNOSIS — S83282S Other tear of lateral meniscus, current injury, left knee, sequela: Secondary | ICD-10-CM | POA: Diagnosis not present

## 2020-05-05 DIAGNOSIS — M6281 Muscle weakness (generalized): Secondary | ICD-10-CM | POA: Diagnosis not present

## 2020-05-06 ENCOUNTER — Other Ambulatory Visit: Payer: Self-pay

## 2020-05-06 ENCOUNTER — Other Ambulatory Visit: Payer: Self-pay | Admitting: Family Medicine

## 2020-05-06 ENCOUNTER — Ambulatory Visit: Payer: Self-pay

## 2020-05-06 ENCOUNTER — Ambulatory Visit: Payer: Medicare PPO | Admitting: Family Medicine

## 2020-05-06 VITALS — BP 114/78 | Ht 64.0 in | Wt 160.0 lb

## 2020-05-06 DIAGNOSIS — M1712 Unilateral primary osteoarthritis, left knee: Secondary | ICD-10-CM

## 2020-05-06 MED ORDER — HYDROCODONE-ACETAMINOPHEN 5-325 MG PO TABS
1.0000 | ORAL_TABLET | Freq: Three times a day (TID) | ORAL | 0 refills | Status: DC | PRN
Start: 2020-05-06 — End: 2020-05-06

## 2020-05-06 MED FILL — HYDROCODON-APAP 5-325: 5-325 | 5 days supply | Qty: 15 | Fill #0

## 2020-05-06 NOTE — Assessment & Plan Note (Addendum)
Having more pain in the medial compartment with large effusion.  -Counseled on home exercise therapy and supportive care. -Continue limited weightbearing on the left knee. -Aspiration today. - norco.  -Pursue gel injections.

## 2020-05-06 NOTE — Progress Notes (Addendum)
Cynthia [name] - 74 y.o. female MRN [id number]  Date of birth: [birth year]  SUBJECTIVE:  Including CC & ROS.  No chief complaint on file.   Cynthia [name] is a 74 y.o. female that is presenting with worsening of her left knee pain. Having swelling and pain over the medial aspect. Has been doing physical therapy and using the rolling walker.   Review of Systems See HPI   HISTORY: Past Medical, Surgical, Social, and Family History Reviewed & Updated per EMR.   Pertinent Historical Findings include:  Past Medical History:  Diagnosis Date  . Allergy   . Anxiety   . Arthritis   . Blood transfusion without reported diagnosis   . Constipation, chronic   . DEGENERATIVE JOINT DISEASE 06/02/2010  . Depression   . Glaucoma 2014   Right eye  . Headache(784.0)   . Hyperlipidemia    hx of  . Osteopenia   . Psoriasis   . PUD (peptic ulcer disease) 2012   Upper GI bleed d/t NSAIDs  . RLS (restless legs syndrome)    robaxin prn    Past Surgical History:  Procedure Laterality Date  . ANKLE FRACTURE SURGERY Left    surgery 12-11 (LEFT)  . CESAREAN SECTION  1969    Family History  Problem Relation Age of Onset  . Aneurysm Mother        brain  . Celiac disease Brother   . Diabetes Maternal Grandfather   . Hypertension Neg Hx   . Breast cancer Neg Hx   . Coronary artery disease Neg Hx   . Colon cancer Neg Hx   . Esophageal cancer Neg Hx   . Stomach cancer Neg Hx   . Rectal cancer Neg Hx     Social History   Socioeconomic History  . Marital status: Widowed    Spouse name: Not on file  . Number of children: 1  . Years of education: Not on file  . Highest education level: Doctorate  Occupational History  . Occupation: retired Environmental consultant: RETIRED  Tobacco Use  . Smoking status: Never Smoker  . Smokeless tobacco: Never Used  Vaping Use  . Vaping Use: Never used  Substance and Sexual Activity  . Alcohol use: Yes    Comment: socially  . Drug use: No   . Sexual activity: Not on file  Other Topics Concern  . Not on file  Social History Narrative   2 step children, 1 child , 81 GK   Lost husband, moved [place] 02-13-2020 ; apartment, 3th floor w/ elevator      Pt is right handed   Social Determinants of Radio broadcast assistant Strain: Not on file  Food Insecurity: Not on file  Transportation Needs: Not on file  Physical Activity: Not on file  Stress: Not on file  Social Connections: Not on file  Intimate Partner Violence: Not on file     PHYSICAL EXAM:  VS: BP 114/78   Ht 5\' 4"  (1.626 m)   Wt 160 lb (72.6 kg)   BMI 27.46 kg/m  Physical Exam Gen: NAD, alert, cooperative with exam, well-appearing MSK:  Left knee:  Effusion  Normal range of motion  Normal strength to resistance.  Neurovascularly intact.     Aspiration/Injection Procedure Note [name] 1946-03-30  Procedure: Aspiration Indications: left knee pain   Procedure Details Consent: Risks of procedure as well as the alternatives and risks of each were  explained to the (patient/caregiver).  Consent for procedure obtained. Time Out: Verified patient identification, verified procedure, site/side was marked, verified correct patient position, special equipment/implants available, medications/allergies/relevent history reviewed, required imaging and test results available.  Performed.  The area was cleaned with iodine and alcohol swabs.    The left knee superior lateral suprapatellar pouch was injected using 5cc's of 1% lidocaine with a 25 1 1/2" needle.  An 18-gauge 1-1/2 inch needle was used to achieve aspiration.  Ultrasound was used. Images were obtained in long views showing the injection.    Amount of Fluid Aspirated: 87mL Character of Fluid: clear and straw colored Fluid was sent for:n/a  A sterile dressing was applied.  Patient did tolerate procedure well.     ASSESSMENT & PLAN:   OA (osteoarthritis) of knee Having more pain in the  medial compartment with large effusion.  -Counseled on home exercise therapy and supportive care. -Continue limited weightbearing on the left knee. -Aspiration today. - norco.  -Pursue gel injections.

## 2020-05-06 NOTE — Patient Instructions (Signed)
Good to see you Please try ice as needed  Please continue the walker   Please send me a message in MyChart with any questions or updates.  We'll inform you about the gel injection.   --Dr. Raeford Razor

## 2020-05-10 DIAGNOSIS — R2689 Other abnormalities of gait and mobility: Secondary | ICD-10-CM | POA: Diagnosis not present

## 2020-05-10 DIAGNOSIS — M1990 Unspecified osteoarthritis, unspecified site: Secondary | ICD-10-CM | POA: Diagnosis not present

## 2020-05-10 DIAGNOSIS — S83282S Other tear of lateral meniscus, current injury, left knee, sequela: Secondary | ICD-10-CM | POA: Diagnosis not present

## 2020-05-10 DIAGNOSIS — M6281 Muscle weakness (generalized): Secondary | ICD-10-CM | POA: Diagnosis not present

## 2020-05-10 DIAGNOSIS — M25562 Pain in left knee: Secondary | ICD-10-CM | POA: Diagnosis not present

## 2020-05-12 DIAGNOSIS — M6281 Muscle weakness (generalized): Secondary | ICD-10-CM | POA: Diagnosis not present

## 2020-05-12 DIAGNOSIS — R2689 Other abnormalities of gait and mobility: Secondary | ICD-10-CM | POA: Diagnosis not present

## 2020-05-12 DIAGNOSIS — M1990 Unspecified osteoarthritis, unspecified site: Secondary | ICD-10-CM | POA: Diagnosis not present

## 2020-05-12 DIAGNOSIS — M25562 Pain in left knee: Secondary | ICD-10-CM | POA: Diagnosis not present

## 2020-05-12 DIAGNOSIS — S83282S Other tear of lateral meniscus, current injury, left knee, sequela: Secondary | ICD-10-CM | POA: Diagnosis not present

## 2020-05-17 DIAGNOSIS — M25562 Pain in left knee: Secondary | ICD-10-CM | POA: Diagnosis not present

## 2020-05-17 DIAGNOSIS — M6281 Muscle weakness (generalized): Secondary | ICD-10-CM | POA: Diagnosis not present

## 2020-05-17 DIAGNOSIS — R2689 Other abnormalities of gait and mobility: Secondary | ICD-10-CM | POA: Diagnosis not present

## 2020-05-17 DIAGNOSIS — M1990 Unspecified osteoarthritis, unspecified site: Secondary | ICD-10-CM | POA: Diagnosis not present

## 2020-05-17 DIAGNOSIS — S83282S Other tear of lateral meniscus, current injury, left knee, sequela: Secondary | ICD-10-CM | POA: Diagnosis not present

## 2020-05-19 DIAGNOSIS — M25562 Pain in left knee: Secondary | ICD-10-CM | POA: Diagnosis not present

## 2020-05-19 DIAGNOSIS — S83282S Other tear of lateral meniscus, current injury, left knee, sequela: Secondary | ICD-10-CM | POA: Diagnosis not present

## 2020-05-19 DIAGNOSIS — M1990 Unspecified osteoarthritis, unspecified site: Secondary | ICD-10-CM | POA: Diagnosis not present

## 2020-05-19 DIAGNOSIS — R2689 Other abnormalities of gait and mobility: Secondary | ICD-10-CM | POA: Diagnosis not present

## 2020-05-19 DIAGNOSIS — M6281 Muscle weakness (generalized): Secondary | ICD-10-CM | POA: Diagnosis not present

## 2020-05-24 DIAGNOSIS — R2689 Other abnormalities of gait and mobility: Secondary | ICD-10-CM | POA: Diagnosis not present

## 2020-05-24 DIAGNOSIS — M25562 Pain in left knee: Secondary | ICD-10-CM | POA: Diagnosis not present

## 2020-05-24 DIAGNOSIS — M1990 Unspecified osteoarthritis, unspecified site: Secondary | ICD-10-CM | POA: Diagnosis not present

## 2020-05-24 DIAGNOSIS — M6281 Muscle weakness (generalized): Secondary | ICD-10-CM | POA: Diagnosis not present

## 2020-05-24 DIAGNOSIS — S83282S Other tear of lateral meniscus, current injury, left knee, sequela: Secondary | ICD-10-CM | POA: Diagnosis not present

## 2020-05-26 DIAGNOSIS — M1990 Unspecified osteoarthritis, unspecified site: Secondary | ICD-10-CM | POA: Diagnosis not present

## 2020-05-26 DIAGNOSIS — R2689 Other abnormalities of gait and mobility: Secondary | ICD-10-CM | POA: Diagnosis not present

## 2020-05-26 DIAGNOSIS — M6281 Muscle weakness (generalized): Secondary | ICD-10-CM | POA: Diagnosis not present

## 2020-05-26 DIAGNOSIS — M25562 Pain in left knee: Secondary | ICD-10-CM | POA: Diagnosis not present

## 2020-05-26 DIAGNOSIS — S83282S Other tear of lateral meniscus, current injury, left knee, sequela: Secondary | ICD-10-CM | POA: Diagnosis not present

## 2020-05-28 ENCOUNTER — Telehealth: Payer: Self-pay | Admitting: Family Medicine

## 2020-05-28 NOTE — Telephone Encounter (Signed)
Patient called to check status of Zilretta injection process--advised we are waiting on her health Ins to approve it.  Forwarding msg to med asst for update. Call pt @ [phone]  --glh

## 2020-05-28 NOTE — Telephone Encounter (Signed)
Checking for update on Zilretta status.  -glh

## 2020-06-10 ENCOUNTER — Ambulatory Visit: Payer: Medicare PPO | Admitting: Family Medicine

## 2020-06-10 NOTE — Progress Notes (Deleted)
  Cynthia [name] - 75 y.o. female MRN [id number]  Date of birth: 1945/12/19  SUBJECTIVE:  Including CC & ROS.  No chief complaint on file.   Cynthia [name] is a 75 y.o. female that is  ***.  ***   Review of Systems See HPI   HISTORY: Past Medical, Surgical, Social, and Family History Reviewed & Updated per EMR.   Pertinent Historical Findings include:  Past Medical History:  Diagnosis Date  . Allergy   . Anxiety   . Arthritis   . Blood transfusion without reported diagnosis   . Constipation, chronic   . DEGENERATIVE JOINT DISEASE 06/02/2010  . Depression   . Glaucoma 2014   Right eye  . Headache(784.0)   . Hyperlipidemia    hx of  . Osteopenia   . Psoriasis   . PUD (peptic ulcer disease) 2012   Upper GI bleed d/t NSAIDs  . RLS (restless legs syndrome)    robaxin prn    Past Surgical History:  Procedure Laterality Date  . ANKLE FRACTURE SURGERY Left    surgery 12-11 (LEFT)  . CESAREAN SECTION  1969    Family History  Problem Relation Age of Onset  . Aneurysm Mother        brain  . Celiac disease Brother   . Diabetes Maternal Grandfather   . Hypertension Neg Hx   . Breast cancer Neg Hx   . Coronary artery disease Neg Hx   . Colon cancer Neg Hx   . Esophageal cancer Neg Hx   . Stomach cancer Neg Hx   . Rectal cancer Neg Hx     Social History   Socioeconomic History  . Marital status: Widowed    Spouse name: Not on file  . Number of children: 1  . Years of education: Not on file  . Highest education level: Doctorate  Occupational History  . Occupation: retired Environmental consultant: RETIRED  Tobacco Use  . Smoking status: Never Smoker  . Smokeless tobacco: Never Used  Vaping Use  . Vaping Use: Never used  Substance and Sexual Activity  . Alcohol use: Yes    Comment: socially  . Drug use: No  . Sexual activity: Not on file  Other Topics Concern  . Not on file  Social History Narrative   2 step children, 1 child , 7 GK   Lost  husband, moved [place] 02-13-2020 ; apartment, 3th floor w/ elevator      Pt is right handed   Social Determinants of Health   Financial Resource Strain: Not on file  Food Insecurity: Not on file  Transportation Needs: Not on file  Physical Activity: Not on file  Stress: Not on file  Social Connections: Not on file  Intimate Partner Violence: Not on file     PHYSICAL EXAM:  VS: There were no vitals taken for this visit. Physical Exam Gen: NAD, alert, cooperative with exam, well-appearing MSK:  ***      ASSESSMENT & PLAN:   No problem-specific Assessment & Plan notes found for this encounter.

## 2020-06-17 ENCOUNTER — Ambulatory Visit: Payer: Medicare PPO | Admitting: Family Medicine

## 2020-06-17 ENCOUNTER — Ambulatory Visit: Payer: Self-pay

## 2020-06-17 ENCOUNTER — Other Ambulatory Visit: Payer: Self-pay

## 2020-06-17 DIAGNOSIS — M816 Localized osteoporosis [Lequesne]: Secondary | ICD-10-CM

## 2020-06-17 DIAGNOSIS — M1712 Unilateral primary osteoarthritis, left knee: Secondary | ICD-10-CM | POA: Diagnosis not present

## 2020-06-17 MED ORDER — ALENDRONATE SODIUM 70 MG PO TABS: 70.0000 mg | ORAL_TABLET | ORAL | 11 refills | Status: DC

## 2020-06-17 NOTE — Assessment & Plan Note (Addendum)
Having ongoing pain. We have aspirated significant fluid.  - Gel injection  -Continue physical therapy

## 2020-06-17 NOTE — Patient Instructions (Signed)
Good to see you Happy early birthday.  Please use ice as needed   Please send me a message in MyChart with any questions or updates.  Please see me back in 4 weeks.   --Dr. Labrittany Wechter  

## 2020-06-17 NOTE — Progress Notes (Signed)
Cynthia [name] - 75 y.o. female MRN [id number]  Date of birth: 1945-06-16  SUBJECTIVE:  Including CC & ROS.  No chief complaint on file.   Cynthia [name] is a 75 y.o. female that is following up for her left knee pain.  She has had ongoing degenerative changes and has tried a steroid injection.  She is currently in physical therapy.   Review of Systems See HPI   HISTORY: Past Medical, Surgical, Social, and Family History Reviewed & Updated per EMR.   Pertinent Historical Findings include:  Past Medical History:  Diagnosis Date  . Allergy   . Anxiety   . Arthritis   . Blood transfusion without reported diagnosis   . Constipation, chronic   . DEGENERATIVE JOINT DISEASE 06/02/2010  . Depression   . Glaucoma 2014   Right eye  . Headache(784.0)   . Hyperlipidemia    hx of  . Osteopenia   . Psoriasis   . PUD (peptic ulcer disease) 2012   Upper GI bleed d/t NSAIDs  . RLS (restless legs syndrome)    robaxin prn    Past Surgical History:  Procedure Laterality Date  . ANKLE FRACTURE SURGERY Left    surgery 12-11 (LEFT)  . CESAREAN SECTION  1969    Family History  Problem Relation Age of Onset  . Aneurysm Mother        brain  . Celiac disease Brother   . Diabetes Maternal Grandfather   . Hypertension Neg Hx   . Breast cancer Neg Hx   . Coronary artery disease Neg Hx   . Colon cancer Neg Hx   . Esophageal cancer Neg Hx   . Stomach cancer Neg Hx   . Rectal cancer Neg Hx     Social History   Socioeconomic History  . Marital status: Widowed    Spouse name: Not on file  . Number of children: 1  . Years of education: Not on file  . Highest education level: Doctorate  Occupational History  . Occupation: retired Environmental consultant: RETIRED  Tobacco Use  . Smoking status: Never Smoker  . Smokeless tobacco: Never Used  Vaping Use  . Vaping Use: Never used  Substance and Sexual Activity  . Alcohol use: Yes    Comment: socially  . Drug use: No  .  Sexual activity: Not on file  Other Topics Concern  . Not on file  Social History Narrative   2 step children, 1 child , 72 GK   Lost husband, moved [place] 02-13-2020 ; apartment, 3th floor w/ elevator      Pt is right handed   Social Determinants of Radio broadcast assistant Strain: Not on file  Food Insecurity: Not on file  Transportation Needs: Not on file  Physical Activity: Not on file  Stress: Not on file  Social Connections: Not on file  Intimate Partner Violence: Not on file     PHYSICAL EXAM:  VS: BP 120/80 (BP Location: Left Arm, Patient Position: Sitting, Cuff Size: Large)   Ht 5\' 4"  (1.626 m)   Wt 160 lb (72.6 kg)   BMI 27.46 kg/m  Physical Exam Gen: NAD, alert, cooperative with exam, well-appearing    Aspiration/Injection Procedure Note Cynthia [name] Jul 28, 1945  Procedure: Injection Indications: Left knee pain  Procedure Details Consent: Risks of procedure as well as the alternatives and risks of each were explained to the (patient/caregiver).  Consent for procedure obtained. Time  Out: Verified patient identification, verified procedure, site/side was marked, verified correct patient position, special equipment/implants available, medications/allergies/relevent history reviewed, required imaging and test results available.  Performed.  The area was cleaned with iodine and alcohol swabs.    The left knee superior lateral suprapatellar pouch was injected using 4 cc's of 1% lidocaine with a 25 1 1/2" needle.  An 18-gauge 1-1/2 inch needle was used to achieve aspiration.  The syringe was switched and a 4 mL of 22 mg/mL. Ultrasound was used. Images were obtained in  Long views showing the injection.    Amount of Fluid Aspirated: 46mL Character of Fluid: clear and straw colored Fluid was sent for:n/a A sterile dressing was applied.  Patient did tolerate procedure well.      ASSESSMENT & PLAN:   OA (osteoarthritis) of knee Having ongoing pain. We  have aspirated significant fluid.  - Gel injection  -Continue physical therapy  Osteoporosis Prolia was not covered by her insurance.  We will restart the Fosamax.

## 2020-06-17 NOTE — Assessment & Plan Note (Signed)
Prolia was not covered by her insurance.  We will restart the Fosamax.

## 2020-07-07 DIAGNOSIS — R2689 Other abnormalities of gait and mobility: Secondary | ICD-10-CM | POA: Diagnosis not present

## 2020-07-07 DIAGNOSIS — M6281 Muscle weakness (generalized): Secondary | ICD-10-CM | POA: Diagnosis not present

## 2020-07-07 DIAGNOSIS — M25562 Pain in left knee: Secondary | ICD-10-CM | POA: Diagnosis not present

## 2020-07-07 DIAGNOSIS — M1990 Unspecified osteoarthritis, unspecified site: Secondary | ICD-10-CM | POA: Diagnosis not present

## 2020-07-07 DIAGNOSIS — S83282S Other tear of lateral meniscus, current injury, left knee, sequela: Secondary | ICD-10-CM | POA: Diagnosis not present

## 2020-07-08 ENCOUNTER — Other Ambulatory Visit: Payer: Self-pay | Admitting: Neurology

## 2020-07-08 ENCOUNTER — Telehealth: Payer: Self-pay

## 2020-07-08 MED ORDER — TRAMADOL HCL 50 MG PO TABS: 50.0000 mg | ORAL_TABLET | Freq: Four times a day (QID) | ORAL | 2 refills | Status: DC | PRN

## 2020-07-08 NOTE — Telephone Encounter (Signed)
Message left by pt, Please send a script of Tramdaol to the pharmacy.

## 2020-07-08 NOTE — Telephone Encounter (Signed)
Done

## 2020-07-09 DIAGNOSIS — M1990 Unspecified osteoarthritis, unspecified site: Secondary | ICD-10-CM | POA: Diagnosis not present

## 2020-07-09 DIAGNOSIS — M6281 Muscle weakness (generalized): Secondary | ICD-10-CM | POA: Diagnosis not present

## 2020-07-09 DIAGNOSIS — R2689 Other abnormalities of gait and mobility: Secondary | ICD-10-CM | POA: Diagnosis not present

## 2020-07-09 DIAGNOSIS — M25562 Pain in left knee: Secondary | ICD-10-CM | POA: Diagnosis not present

## 2020-07-09 DIAGNOSIS — S83282S Other tear of lateral meniscus, current injury, left knee, sequela: Secondary | ICD-10-CM | POA: Diagnosis not present

## 2020-07-13 DIAGNOSIS — M6281 Muscle weakness (generalized): Secondary | ICD-10-CM | POA: Diagnosis not present

## 2020-07-13 DIAGNOSIS — R2689 Other abnormalities of gait and mobility: Secondary | ICD-10-CM | POA: Diagnosis not present

## 2020-07-13 DIAGNOSIS — S83282S Other tear of lateral meniscus, current injury, left knee, sequela: Secondary | ICD-10-CM | POA: Diagnosis not present

## 2020-07-13 DIAGNOSIS — M25562 Pain in left knee: Secondary | ICD-10-CM | POA: Diagnosis not present

## 2020-07-13 DIAGNOSIS — M1990 Unspecified osteoarthritis, unspecified site: Secondary | ICD-10-CM | POA: Diagnosis not present

## 2020-07-16 ENCOUNTER — Ambulatory Visit: Payer: Medicare PPO | Admitting: Family Medicine

## 2020-07-16 ENCOUNTER — Encounter: Payer: Self-pay | Admitting: Family Medicine

## 2020-07-16 ENCOUNTER — Other Ambulatory Visit: Payer: Self-pay

## 2020-07-16 VITALS — BP 130/72 | Ht 64.0 in | Wt 160.0 lb

## 2020-07-16 DIAGNOSIS — M1712 Unilateral primary osteoarthritis, left knee: Secondary | ICD-10-CM | POA: Diagnosis not present

## 2020-07-16 DIAGNOSIS — E559 Vitamin D deficiency, unspecified: Secondary | ICD-10-CM

## 2020-07-16 DIAGNOSIS — M816 Localized osteoporosis [Lequesne]: Secondary | ICD-10-CM

## 2020-07-16 NOTE — Patient Instructions (Signed)
Good to see you Please try 1200 mg of calcium and 2000 IU of vitamin D  Please try vitamin K2  Please continue physical therapy   Please send me a message in MyChart with any questions or updates.  Please see me back in 6-8 weeks.   --Dr. Raeford Razor

## 2020-07-16 NOTE — Assessment & Plan Note (Signed)
Having some reflux with the Fosamax. Checking vitamin D.

## 2020-07-16 NOTE — Assessment & Plan Note (Signed)
He continues to slowly improve. Completed the gel injections about a month ago. Having a little bit of fluid on the knee today. -Counseled on home exercise therapy and supportive care. -Can continue physical therapy. -Can repeat injection if needed.

## 2020-07-16 NOTE — Progress Notes (Signed)
Cynthia [name] - 75 y.o. female MRN [id number]  Date of birth: 08/30/1945  SUBJECTIVE:  Including CC & ROS.  No chief complaint on file.   Cynthia [name] is a 75 y.o. female that is following up for left knee pain. She has been taking the Fosamax and endorses reflux. Knee has improved but still having pain intermittently.   Review of Systems See HPI   HISTORY: Past Medical, Surgical, Social, and Family History Reviewed & Updated per EMR.   Pertinent Historical Findings include:  Past Medical History:  Diagnosis Date  . Allergy   . Anxiety   . Arthritis   . Blood transfusion without reported diagnosis   . Constipation, chronic   . DEGENERATIVE JOINT DISEASE 06/02/2010  . Depression   . Glaucoma 2014   Right eye  . Headache(784.0)   . Hyperlipidemia    hx of  . Osteopenia   . Psoriasis   . PUD (peptic ulcer disease) 2012   Upper GI bleed d/t NSAIDs  . RLS (restless legs syndrome)    robaxin prn    Past Surgical History:  Procedure Laterality Date  . ANKLE FRACTURE SURGERY Left    surgery 12-11 (LEFT)  . CESAREAN SECTION  1969    Family History  Problem Relation Age of Onset  . Aneurysm Mother        brain  . Celiac disease Brother   . Diabetes Maternal Grandfather   . Hypertension Neg Hx   . Breast cancer Neg Hx   . Coronary artery disease Neg Hx   . Colon cancer Neg Hx   . Esophageal cancer Neg Hx   . Stomach cancer Neg Hx   . Rectal cancer Neg Hx     Social History   Socioeconomic History  . Marital status: Widowed    Spouse name: Not on file  . Number of children: 1  . Years of education: Not on file  . Highest education level: Doctorate  Occupational History  . Occupation: retired Environmental consultant: RETIRED  Tobacco Use  . Smoking status: Never Smoker  . Smokeless tobacco: Never Used  Vaping Use  . Vaping Use: Never used  Substance and Sexual Activity  . Alcohol use: Yes    Comment: socially  . Drug use: No  . Sexual  activity: Not on file  Other Topics Concern  . Not on file  Social History Narrative   2 step children, 1 child , 7 GK   Lost husband, moved [place] 02-13-2020 ; apartment, 3th floor w/ elevator      Pt is right handed   Social Determinants of Radio broadcast assistant Strain: Not on file  Food Insecurity: Not on file  Transportation Needs: Not on file  Physical Activity: Not on file  Stress: Not on file  Social Connections: Not on file  Intimate Partner Violence: Not on file     PHYSICAL EXAM:  VS: BP 130/72 (BP Location: Left Arm, Patient Position: Sitting, Cuff Size: Normal)   Ht 5\' 4"  (1.626 m)   Wt 160 lb (72.6 kg)   BMI 27.46 kg/m  Physical Exam Gen: NAD, alert, cooperative with exam, well-appearing MSK:  Left knee: Mild effusion. Normal range of motion. Normal strength resistance. Neurovascular intact     ASSESSMENT & PLAN:   Osteoporosis Having some reflux with the Fosamax. Checking vitamin D.  OA (osteoarthritis) of knee He continues to slowly improve. Completed the gel injections  about a month ago. Having a little bit of fluid on the knee today. -Counseled on home exercise therapy and supportive care. -Can continue physical therapy. -Can repeat injection if needed.

## 2020-07-17 LAB — VITAMIN D 25 HYDROXY (VIT D DEFICIENCY, FRACTURES): Vit D, 25-Hydroxy: 40.9 ng/mL (ref 30.0–100.0)

## 2020-07-19 ENCOUNTER — Telehealth: Payer: Self-pay | Admitting: Family Medicine

## 2020-07-19 NOTE — Telephone Encounter (Signed)
Left VM for patient. If she calls back please have her speak with a nurse/CMA and inform her vitamin d is normal.   If any questions then please take the best time and phone number to call and I will try to call her back.   Rosemarie Ax, MD [facility] 07/19/2020, 9:40 AM

## 2020-07-19 NOTE — Telephone Encounter (Signed)
Forwarding message to med asst to contact pt re:  Provider's notes on Lab results  glh

## 2020-07-20 DIAGNOSIS — M6281 Muscle weakness (generalized): Secondary | ICD-10-CM | POA: Diagnosis not present

## 2020-07-20 DIAGNOSIS — M1990 Unspecified osteoarthritis, unspecified site: Secondary | ICD-10-CM | POA: Diagnosis not present

## 2020-07-20 DIAGNOSIS — M25562 Pain in left knee: Secondary | ICD-10-CM | POA: Diagnosis not present

## 2020-07-20 DIAGNOSIS — S83282S Other tear of lateral meniscus, current injury, left knee, sequela: Secondary | ICD-10-CM | POA: Diagnosis not present

## 2020-07-20 DIAGNOSIS — R2689 Other abnormalities of gait and mobility: Secondary | ICD-10-CM | POA: Diagnosis not present

## 2020-07-20 NOTE — Telephone Encounter (Signed)
Pt informed of vit D labs via [facility].

## 2020-07-27 ENCOUNTER — Encounter: Payer: Self-pay | Admitting: Internal Medicine

## 2020-07-27 DIAGNOSIS — R2689 Other abnormalities of gait and mobility: Secondary | ICD-10-CM | POA: Diagnosis not present

## 2020-07-27 DIAGNOSIS — M25562 Pain in left knee: Secondary | ICD-10-CM | POA: Diagnosis not present

## 2020-07-27 DIAGNOSIS — S83282S Other tear of lateral meniscus, current injury, left knee, sequela: Secondary | ICD-10-CM | POA: Diagnosis not present

## 2020-07-27 DIAGNOSIS — M1990 Unspecified osteoarthritis, unspecified site: Secondary | ICD-10-CM | POA: Diagnosis not present

## 2020-07-27 DIAGNOSIS — M6281 Muscle weakness (generalized): Secondary | ICD-10-CM | POA: Diagnosis not present

## 2020-07-27 DIAGNOSIS — Z1211 Encounter for screening for malignant neoplasm of colon: Secondary | ICD-10-CM

## 2020-07-27 NOTE — Progress Notes (Signed)
NEUROLOGY FOLLOW UP OFFICE NOTE  Cynthia [name] [id number]  Assessment/Plan:   1. Migraine without aura, without status migrainosus, not intractable - worsening headaches - more severe and frequent  2.  Transient left hemisensory loss.  TIA vs migraine.  As no prior history, must assume TIA. 3.  Small Acomm aneurysm  1.  Due to her age, significantly worsening headaches, and history of cerebral aneurysm, will check MRI and MRA of brain for secondary etiologies.  Will also check sed rate. 2.  Will start Emgality.  If not a financially reasonable option (she has [employer]), will instead start propranolol ER 60mg  daily.  Continue verapamil for now. 3.  Tramadol as needed.  Limit use of pain relievers to no more than 2 days out of week to prevent risk of rebound or medication-overuse headache. 4.  Keep headache diary 5.  Follow up 4 months.  Subjective:  Cynthia [name] is a 75 year old right-handed woman with depression, glaucoma, degenerative joint disease and history of TIA who follows up for headaches  UPDATE: Usually gets increased headaches in December but they have not subsided.  Headache frequency has gotten worse. Intensity:  moderate Duration:  3 to 5 hours but 3 have lasted all day from morning to night. Frequency:  20 in past month. Current NSAIDS:ASA 81mg  daily Current analgesics:tramadol;acetaminophen-caffeine Current triptans:none Current ergotamine:no Current anti-emetic:Zofran Current muscle relaxants:Robaxin 500mg TID Current anti-anxiolytic:no Current sleep aide:Ambien Current Antihypertensive medications:verapamil CR 180mg  Current Antidepressant medications:Sertraline100mg , bupropion300mg  Current Anticonvulsant medications:no Current anti-CGRP:no Current Vitamins/Herbal/Supplements:B complex, Mg Current Antihistamines/Decongestants:Mucinex, Astelin Other therapy:1 cup black coffee with melatonin 10mg  at bedtime Other  medication: Zetia  No subsequent stroke-like symptoms.  Depression and anxiety: Yes Other pain: Joint pain  HISTORY: She started having headaches infrequently in 2015but progressively became more frequent in 2016.Varies, but they are usually left sided, involving the ear, but also may be right sided (involving the sinuses) or in band-like distribution.It is both throbbing and non-throbbing.Initially, it is usually 5/10 but severe episodes are 7-8/10.Sometimes there is nausea.There is phonophobia.There is noassociatedphotophobia,visual disturbanceor unilateral numbness or weakness.Initially, it lasts a couple of hours and occuring 15 days out of the month (3 to 4 days severe).It wakes her up at 4:30 to 5 AM.She reports sensation of congestion but no runny nose.CT of sinuses showed opacified right sphenoid sinus and ethmoid air cell.She was treated a couple of times with Augmentin and prednisone, which was effective for a while but headaches then returned.She cannot think of a trigger. They are worse during the summer.There are no aggravating or relieving factors.MRI of brain from 06/10/15 was normal. Sed Rate from 12/27/15 was 10.  She was admitted to [facility] on 09/14/2019 for TIA presenting as transient left sided hemibody numbness(first leg, then arm, then face).Mostly resolved quickly but lingered for several hours. CT head showed right internal capsule hypodensity but follow up MRI of brain showed no acute infarct. 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 2D echo showed EF 60-65% with no source of embolus. LDL was 162 and Hgb A1c was 5.2. She was started on ASA 81mg  daily and Plavix 75mg  for 3 weeks followed by ASA alone. The next morning she had a habitual migraine headache except it lasted all day.She was also started on Zetia (she has statin intolerance). Unclear if event was TIA or complicated migraine.  Past NSAIDS:ibuprofen  (ulcers) Past analgesics:Tylenol Past abortive triptans:sumatriptan (advised to discontinue due to age) Past abortive ergotamine:no Past muscle relaxants:no Past  anti-emetic:no Past antihypertensive medications:no Past antidepressant medications:Nortriptyline 50mg  Past anticonvulsant medications:topiramate 25mg  (concerned about glaucoma) Past anti-CGRP:no Past vitamins/Herbal/Supplements:no Past antihistamines/decongestants:no Other past therapies:none  PAST MEDICAL HISTORY: Past Medical History:  Diagnosis Date  . Allergy   . Anxiety   . Arthritis   . Blood transfusion without reported diagnosis   . Constipation, chronic   . DEGENERATIVE JOINT DISEASE 06/02/2010  . Depression   . Glaucoma 2014   Right eye  . Headache(784.0)   . Hyperlipidemia    hx of  . Osteopenia   . Psoriasis   . PUD (peptic ulcer disease) 2012   Upper GI bleed d/t NSAIDs  . RLS (restless legs syndrome)    robaxin prn    MEDICATIONS: Current Outpatient Medications on File Prior to Visit  Medication Sig Dispense Refill  . alendronate (FOSAMAX) 70 MG tablet Take 1 tablet (70 mg total) by mouth every 7 (seven) days. 4 tablet 11  . buPROPion (WELLBUTRIN XL) 300 MG 24 hr tablet Take 1 tablet (300 mg total) by mouth daily. 90 tablet 1  . cetirizine (ZYRTEC) 10 MG tablet Take 10 mg by mouth at bedtime as needed for allergies.    Marland Kitchen ezetimibe (ZETIA) 10 MG tablet Take 1 tablet (10 mg total) by mouth daily. 90 tablet 3  . HYDROcodone-acetaminophen (NORCO/VICODIN) 5-325 MG tablet Take 1 tablet by mouth every 8 (eight) hours as needed. 15 tablet 0  . methocarbamol (ROBAXIN) 500 MG tablet Take 1 tablet (500 mg total) by mouth 3 (three) times daily as needed for muscle spasms. 90 tablet 1  . pantoprazole (PROTONIX) 40 MG tablet Take 1 tablet (40 mg total) by mouth daily. 30 tablet 3  . sertraline (ZOLOFT) 100 MG tablet Take 1 tablet (100 mg total) by mouth daily. (Patient taking  differently: Take 100 mg by mouth at bedtime.) 90 tablet 3  . traMADol (ULTRAM) 50 MG tablet Take 1 tablet (50 mg total) by mouth every 6 (six) hours as needed. 20 tablet 2  . Travoprost, BAK Free, (TRAVATAN) 0.004 % SOLN ophthalmic solution Place 1 drop into the right eye at bedtime.    . verapamil (CALAN-SR) 180 MG CR tablet TAKE ONE TABLET BY MOUTH EVERY NIGHT AT BEDTIME 90 tablet 4   No current facility-administered medications on file prior to visit.    ALLERGIES: Allergies  Allergen Reactions  . Motrin [Ibuprofen]     GI bleed  . Pollen Extract   . Statins     "delibitating pain all over body"  . Sulfonamide Derivatives     Pt unsure of reaction  . Ciprofloxacin Swelling and Rash    FAMILY HISTORY: Family History  Problem Relation Age of Onset  . Aneurysm Mother        brain  . Celiac disease Brother   . Diabetes Maternal Grandfather   . Hypertension Neg Hx   . Breast cancer Neg Hx   . Coronary artery disease Neg Hx   . Colon cancer Neg Hx   . Esophageal cancer Neg Hx   . Stomach cancer Neg Hx   . Rectal cancer Neg Hx       Objective:  Blood pressure 131/78, pulse 92, height 5\' 5"  (1.651 m), weight 168 lb (76.2 kg), SpO2 96 %. General: No acute distress.  Patient appears well-groomed.   Head:  Normocephalic/atraumatic Eyes:  Fundi examined but not visualized Neck: supple, no paraspinal tenderness, full range of motion Heart:  Regular rate and rhythm Lungs:  Clear to auscultation  bilaterally Back: No paraspinal tenderness Neurological Exam: alert and oriented to person, place, and time. Attention span and concentration intact, recent and remote memory intact, fund of knowledge intact.  Speech fluent and not dysarthric, language intact.  CN II-XII intact. Bulk and tone normal, muscle strength 5/5 throughout.  Sensation to light touch, temperature and vibration intact (slightly reduced in left foot).   Deep tendon reflexes 2+ throughout, toes downgoing.  Finger to  nose and heel to shin testing intact.  Gait normal, Romberg negative.     Metta Clines, DO  CC: Kathlene November, MD

## 2020-07-28 ENCOUNTER — Ambulatory Visit: Payer: Medicare PPO | Admitting: Neurology

## 2020-07-28 ENCOUNTER — Encounter: Payer: Self-pay | Admitting: Neurology

## 2020-07-28 ENCOUNTER — Other Ambulatory Visit: Payer: Self-pay

## 2020-07-28 ENCOUNTER — Other Ambulatory Visit (INDEPENDENT_AMBULATORY_CARE_PROVIDER_SITE_OTHER): Payer: Medicare PPO

## 2020-07-28 VITALS — BP 131/78 | HR 92 | Ht 65.0 in | Wt 168.0 lb

## 2020-07-28 DIAGNOSIS — G43009 Migraine without aura, not intractable, without status migrainosus: Secondary | ICD-10-CM | POA: Diagnosis not present

## 2020-07-28 DIAGNOSIS — R519 Headache, unspecified: Secondary | ICD-10-CM

## 2020-07-28 DIAGNOSIS — I671 Cerebral aneurysm, nonruptured: Secondary | ICD-10-CM

## 2020-07-28 LAB — SEDIMENTATION RATE: Sed Rate: 11 mm/hr (ref 0–30)

## 2020-07-28 MED ORDER — EMGALITY 120 MG/ML ~~LOC~~ SOAJ: 120.0000 mg | SUBCUTANEOUS | 5 refills | Status: DC

## 2020-07-28 MED ORDER — EMGALITY 120 MG/ML ~~LOC~~ SOAJ: 240.0000 mg | Freq: Once | SUBCUTANEOUS | 0 refills | Status: AC

## 2020-07-28 NOTE — Patient Instructions (Addendum)
1.  Check MRI and MRA of brain 2.  Check sed rate 3.  Start Emgality - for first dose, take 2 injections and then 1 injection every 28 days 4.  Follow up 4 months

## 2020-07-29 NOTE — Progress Notes (Signed)
Pt advised of her lab results.

## 2020-08-03 DIAGNOSIS — R2689 Other abnormalities of gait and mobility: Secondary | ICD-10-CM | POA: Diagnosis not present

## 2020-08-03 DIAGNOSIS — M25562 Pain in left knee: Secondary | ICD-10-CM | POA: Diagnosis not present

## 2020-08-03 DIAGNOSIS — M1990 Unspecified osteoarthritis, unspecified site: Secondary | ICD-10-CM | POA: Diagnosis not present

## 2020-08-03 DIAGNOSIS — M6281 Muscle weakness (generalized): Secondary | ICD-10-CM | POA: Diagnosis not present

## 2020-08-03 DIAGNOSIS — S83282S Other tear of lateral meniscus, current injury, left knee, sequela: Secondary | ICD-10-CM | POA: Diagnosis not present

## 2020-08-09 DIAGNOSIS — H5203 Hypermetropia, bilateral: Secondary | ICD-10-CM | POA: Diagnosis not present

## 2020-08-09 DIAGNOSIS — H401114 Primary open-angle glaucoma, right eye, indeterminate stage: Secondary | ICD-10-CM | POA: Diagnosis not present

## 2020-08-09 DIAGNOSIS — H353132 Nonexudative age-related macular degeneration, bilateral, intermediate dry stage: Secondary | ICD-10-CM | POA: Diagnosis not present

## 2020-08-09 DIAGNOSIS — H524 Presbyopia: Secondary | ICD-10-CM | POA: Diagnosis not present

## 2020-08-09 DIAGNOSIS — M1990 Unspecified osteoarthritis, unspecified site: Secondary | ICD-10-CM | POA: Diagnosis not present

## 2020-08-09 DIAGNOSIS — R2689 Other abnormalities of gait and mobility: Secondary | ICD-10-CM | POA: Diagnosis not present

## 2020-08-09 DIAGNOSIS — Z961 Presence of intraocular lens: Secondary | ICD-10-CM | POA: Diagnosis not present

## 2020-08-09 DIAGNOSIS — S83282S Other tear of lateral meniscus, current injury, left knee, sequela: Secondary | ICD-10-CM | POA: Diagnosis not present

## 2020-08-09 DIAGNOSIS — H26493 Other secondary cataract, bilateral: Secondary | ICD-10-CM | POA: Diagnosis not present

## 2020-08-09 DIAGNOSIS — M6281 Muscle weakness (generalized): Secondary | ICD-10-CM | POA: Diagnosis not present

## 2020-08-09 DIAGNOSIS — H52203 Unspecified astigmatism, bilateral: Secondary | ICD-10-CM | POA: Diagnosis not present

## 2020-08-09 DIAGNOSIS — M25562 Pain in left knee: Secondary | ICD-10-CM | POA: Diagnosis not present

## 2020-08-09 DIAGNOSIS — H43813 Vitreous degeneration, bilateral: Secondary | ICD-10-CM | POA: Diagnosis not present

## 2020-08-12 DIAGNOSIS — S83282S Other tear of lateral meniscus, current injury, left knee, sequela: Secondary | ICD-10-CM | POA: Diagnosis not present

## 2020-08-12 DIAGNOSIS — M25562 Pain in left knee: Secondary | ICD-10-CM | POA: Diagnosis not present

## 2020-08-12 DIAGNOSIS — M6281 Muscle weakness (generalized): Secondary | ICD-10-CM | POA: Diagnosis not present

## 2020-08-12 DIAGNOSIS — M1990 Unspecified osteoarthritis, unspecified site: Secondary | ICD-10-CM | POA: Diagnosis not present

## 2020-08-12 DIAGNOSIS — R2689 Other abnormalities of gait and mobility: Secondary | ICD-10-CM | POA: Diagnosis not present

## 2020-08-16 ENCOUNTER — Encounter: Payer: Self-pay | Admitting: Neurology

## 2020-08-16 NOTE — Progress Notes (Signed)
Received fax approval for Emgality 120mg  valid until 05/14/21.69ml per 28 days is actually available without PA per fax. Sent to scanning for chart.

## 2020-08-22 ENCOUNTER — Encounter: Payer: Self-pay | Admitting: Family Medicine

## 2020-08-23 ENCOUNTER — Ambulatory Visit
Admission: RE | Admit: 2020-08-23 | Discharge: 2020-08-23 | Disposition: A | Discharge status: Home / Self Care | Payer: Medicare PPO | Source: Ambulatory Visit | Attending: Neurology | Admitting: Neurology

## 2020-08-23 ENCOUNTER — Other Ambulatory Visit: Payer: Self-pay

## 2020-08-23 ENCOUNTER — Encounter: Payer: Self-pay | Admitting: Internal Medicine

## 2020-08-23 DIAGNOSIS — R519 Headache, unspecified: Secondary | ICD-10-CM

## 2020-08-23 DIAGNOSIS — M1990 Unspecified osteoarthritis, unspecified site: Secondary | ICD-10-CM | POA: Diagnosis not present

## 2020-08-23 DIAGNOSIS — I671 Cerebral aneurysm, nonruptured: Secondary | ICD-10-CM

## 2020-08-23 DIAGNOSIS — S83282S Other tear of lateral meniscus, current injury, left knee, sequela: Secondary | ICD-10-CM | POA: Diagnosis not present

## 2020-08-23 DIAGNOSIS — M25562 Pain in left knee: Secondary | ICD-10-CM | POA: Diagnosis not present

## 2020-08-23 DIAGNOSIS — M6281 Muscle weakness (generalized): Secondary | ICD-10-CM | POA: Diagnosis not present

## 2020-08-23 DIAGNOSIS — R2689 Other abnormalities of gait and mobility: Secondary | ICD-10-CM | POA: Diagnosis not present

## 2020-08-23 MED ORDER — SERTRALINE HCL 100 MG PO TABS: 100.0000 mg | ORAL_TABLET | Freq: Every day | ORAL | 1 refills | Status: DC

## 2020-08-25 NOTE — Progress Notes (Signed)
Tried calling pt, no answer. Unable to leave VM.

## 2020-09-01 ENCOUNTER — Encounter: Payer: Self-pay | Admitting: Internal Medicine

## 2020-09-02 ENCOUNTER — Other Ambulatory Visit: Payer: Self-pay | Admitting: Internal Medicine

## 2020-09-02 MED ORDER — ZOLPIDEM TARTRATE 5 MG PO TABS: 5.0000 mg | ORAL_TABLET | Freq: Every evening | ORAL | 1 refills | Status: DC | PRN

## 2020-09-02 NOTE — Progress Notes (Signed)
PDMP reviewed, she did get very small amounts of hydrocodone and tramadol months ago. Okay to refill Ambien

## 2020-09-03 ENCOUNTER — Ambulatory Visit: Payer: Medicare PPO | Admitting: Family Medicine

## 2020-09-06 ENCOUNTER — Other Ambulatory Visit: Payer: Self-pay

## 2020-09-06 ENCOUNTER — Ambulatory Visit: Payer: Self-pay

## 2020-09-06 ENCOUNTER — Encounter: Payer: Self-pay | Admitting: Family Medicine

## 2020-09-06 ENCOUNTER — Ambulatory Visit: Payer: Medicare PPO | Admitting: Family Medicine

## 2020-09-06 VITALS — BP 130/84 | Ht 65.0 in | Wt 168.0 lb

## 2020-09-06 DIAGNOSIS — M1712 Unilateral primary osteoarthritis, left knee: Secondary | ICD-10-CM

## 2020-09-06 MED ORDER — TRIAMCINOLONE ACETONIDE 40 MG/ML IJ SUSP
40.0000 mg | Freq: Once | INTRAMUSCULAR | Status: AC
  Administered 2020-09-06: 40 mg via INTRA_ARTICULAR

## 2020-09-06 NOTE — Progress Notes (Signed)
Cynthia [name] - 75 y.o. female MRN [id number]  Date of birth: [birth year]  SUBJECTIVE:  Including CC & ROS.  No chief complaint on file.   Cynthia [name] is a 75 y.o. female that is presenting with acute worsening of the left knee pain.  She has been having swelling and pain with going up and down the stairs.   Review of Systems See HPI   HISTORY: Past Medical, Surgical, Social, and Family History Reviewed & Updated per EMR.   Pertinent Historical Findings include:  Past Medical History:  Diagnosis Date  . Allergy   . Anxiety   . Arthritis   . Blood transfusion without reported diagnosis   . Constipation, chronic   . DEGENERATIVE JOINT DISEASE 06/02/2010  . Depression   . Glaucoma 2014   Right eye  . Headache(784.0)   . Hyperlipidemia    hx of  . Osteopenia   . Psoriasis   . PUD (peptic ulcer disease) 2012   Upper GI bleed d/t NSAIDs  . RLS (restless legs syndrome)    robaxin prn    Past Surgical History:  Procedure Laterality Date  . ANKLE FRACTURE SURGERY Left    surgery 12-11 (LEFT)  . CESAREAN SECTION  1969    Family History  Problem Relation Age of Onset  . Aneurysm Mother        brain  . Celiac disease Brother   . Diabetes Maternal Grandfather   . Hypertension Neg Hx   . Breast cancer Neg Hx   . Coronary artery disease Neg Hx   . Colon cancer Neg Hx   . Esophageal cancer Neg Hx   . Stomach cancer Neg Hx   . Rectal cancer Neg Hx     Social History   Socioeconomic History  . Marital status: Widowed    Spouse name: Not on file  . Number of children: 1  . Years of education: Not on file  . Highest education level: Doctorate  Occupational History  . Occupation: retired Environmental consultant: RETIRED  Tobacco Use  . Smoking status: Never Smoker  . Smokeless tobacco: Never Used  Vaping Use  . Vaping Use: Never used  Substance and Sexual Activity  . Alcohol use: Yes    Comment: socially  . Drug use: No  . Sexual activity: Not on  file  Other Topics Concern  . Not on file  Social History Narrative   2 step children, 1 child , 7 GK   Lost husband, moved [place] 02-13-2020 ; apartment, 3th floor w/ elevator      Pt is right handed   Social Determinants of Radio broadcast assistant Strain: Not on file  Food Insecurity: Not on file  Transportation Needs: Not on file  Physical Activity: Not on file  Stress: Not on file  Social Connections: Not on file  Intimate Partner Violence: Not on file     PHYSICAL EXAM:  VS: BP 130/84 (BP Location: Left Arm, Patient Position: Sitting, Cuff Size: Normal)   Ht 5\' 5"  (1.651 m)   Wt 168 lb (76.2 kg)   BMI 27.96 kg/m  Physical Exam Gen: NAD, alert, cooperative with exam, well-appearing MSK:  Left knee: Obvious effusion. Limited range of motion. Normal strength resistance. Neurovascular intact   Aspiration/Injection Procedure Note MARSHEA WISHER 31-Oct-1945  Procedure: Aspiration and Injection Indications: Left knee pain  Procedure Details Consent: Risks of procedure as well as the alternatives and risks  of each were explained to the (patient/caregiver).  Consent for procedure obtained. Time Out: Verified patient identification, verified procedure, site/side was marked, verified correct patient position, special equipment/implants available, medications/allergies/relevent history reviewed, required imaging and test results available.  Performed.  The area was cleaned with iodine and alcohol swabs.    The left knee superior lateral suprapatellar pouch was injected using 5 cc 1% lidocaine on a 25-gauge 1-1/2 inch needle.  An 18-gauge 1-1/2 inch needle was used to achieve aspiration.  The syringe was switched and a mixture containing 1 cc's of 40 mg Kenalog and 4 cc's of 0.25% bupivacaine was injected.  Ultrasound was used. Images were obtained in long views showing the injection.    Amount of Fluid Aspirated: 18mL Character of Fluid: clear and straw colored Fluid  was sent for:n/a  A sterile dressing was applied.  Patient did tolerate procedure well.     ASSESSMENT & PLAN:   OA (osteoarthritis) of knee Pain has improved which is likely from the healing of the fracture.  Current pain is likely more degenerative in nature. -Counseled on home exercise therapy and supportive care. -Aspiration injection. - could consider referral to surgery.

## 2020-09-06 NOTE — Patient Instructions (Signed)
Good to see you °Please try ice as needed  °Please try the exercises   °Please send me a message in MyChart with any questions or updates.  °Please see me back in 6-8 weeks.  ° °--Dr. Talulah Schirmer ° °

## 2020-09-06 NOTE — Assessment & Plan Note (Signed)
Pain has improved which is likely from the healing of the fracture.  Current pain is likely more degenerative in nature. -Counseled on home exercise therapy and supportive care. -Aspiration injection. - could consider referral to surgery.

## 2020-09-14 ENCOUNTER — Ambulatory Visit: Payer: Medicare PPO | Admitting: Neurology

## 2020-10-05 ENCOUNTER — Other Ambulatory Visit: Payer: Self-pay | Admitting: Internal Medicine

## 2020-10-13 ENCOUNTER — Other Ambulatory Visit: Payer: Self-pay | Admitting: Neurology

## 2020-10-14 ENCOUNTER — Other Ambulatory Visit: Payer: Self-pay

## 2020-10-14 ENCOUNTER — Ambulatory Visit (INDEPENDENT_AMBULATORY_CARE_PROVIDER_SITE_OTHER): Payer: Medicare PPO | Admitting: *Deleted

## 2020-10-14 DIAGNOSIS — M816 Localized osteoporosis [Lequesne]: Secondary | ICD-10-CM | POA: Diagnosis not present

## 2020-10-14 NOTE — Progress Notes (Signed)
Patient here for her first Evenity injection. Patient received 105 mg/1.17 mL X 2 injections SQ in bilateral arms. She waited in the office for 15 minutes after receiving injection. She tolerated injection well and will return once every 30 days for subsequent injections.

## 2020-10-27 ENCOUNTER — Encounter: Payer: Self-pay | Admitting: Internal Medicine

## 2020-10-27 ENCOUNTER — Other Ambulatory Visit: Payer: Self-pay

## 2020-10-27 ENCOUNTER — Ambulatory Visit: Payer: Medicare PPO | Admitting: Internal Medicine

## 2020-10-27 VITALS — BP 144/82 | HR 94 | Temp 98.1°F | Resp 16 | Ht 65.0 in | Wt 168.2 lb

## 2020-10-27 DIAGNOSIS — F32A Depression, unspecified: Secondary | ICD-10-CM | POA: Diagnosis not present

## 2020-10-27 DIAGNOSIS — F419 Anxiety disorder, unspecified: Secondary | ICD-10-CM

## 2020-10-27 DIAGNOSIS — G4700 Insomnia, unspecified: Secondary | ICD-10-CM

## 2020-10-27 DIAGNOSIS — K219 Gastro-esophageal reflux disease without esophagitis: Secondary | ICD-10-CM | POA: Diagnosis not present

## 2020-10-27 DIAGNOSIS — R1310 Dysphagia, unspecified: Secondary | ICD-10-CM

## 2020-10-27 DIAGNOSIS — M816 Localized osteoporosis [Lequesne]: Secondary | ICD-10-CM

## 2020-10-27 LAB — COMPREHENSIVE METABOLIC PANEL
ALT: 16 U/L (ref 0–35)
AST: 17 U/L (ref 0–37)
Albumin: 4.5 g/dL (ref 3.5–5.2)
Alkaline Phosphatase: 126 U/L — ABNORMAL HIGH (ref 39–117)
BUN: 12 mg/dL (ref 6–23)
CO2: 28 mEq/L (ref 19–32)
Calcium: 9.6 mg/dL (ref 8.4–10.5)
Chloride: 101 mEq/L (ref 96–112)
Creatinine, Ser: 0.76 mg/dL (ref 0.40–1.20)
GFR: 76.15 mL/min (ref >60.00)
Glucose, Bld: 84 mg/dL (ref 70–99)
Potassium: 4.5 mEq/L (ref 3.5–5.1)
Sodium: 137 mEq/L (ref 135–145)
Total Bilirubin: 0.5 mg/dL (ref 0.2–1.2)
Total Protein: 6.7 g/dL (ref 6.0–8.3)

## 2020-10-27 LAB — CBC WITH DIFFERENTIAL/PLATELET
Basophils Absolute: 0.1 10*3/uL (ref 0.0–0.1)
Basophils Relative: 1.3 % (ref 0.0–3.0)
Eosinophils Absolute: 0.8 10*3/uL — ABNORMAL HIGH (ref 0.0–0.7)
Eosinophils Relative: 13.4 % — ABNORMAL HIGH (ref 0.0–5.0)
HCT: 37.8 % (ref 36.0–46.0)
Hemoglobin: 12.8 g/dL (ref 12.0–15.0)
Lymphocytes Relative: 20.4 % (ref 12.0–46.0)
Lymphs Abs: 1.2 10*3/uL (ref 0.7–4.0)
MCHC: 33.9 g/dL (ref 30.0–36.0)
MCV: 92.9 fl (ref 78.0–100.0)
Monocytes Absolute: 0.8 10*3/uL (ref 0.1–1.0)
Monocytes Relative: 12.8 % — ABNORMAL HIGH (ref 3.0–12.0)
Neutro Abs: 3.1 10*3/uL (ref 1.4–7.7)
Neutrophils Relative %: 52.1 % (ref 43.0–77.0)
Platelets: 346 10*3/uL (ref 150.0–400.0)
RBC: 4.07 Mil/uL (ref 3.87–5.11)
RDW: 13.5 % (ref 11.5–15.5)
WBC: 6 10*3/uL (ref 4.0–10.5)

## 2020-10-27 LAB — TSH: TSH: 3 u[IU]/mL (ref 0.35–4.50)

## 2020-10-27 MED ORDER — SERTRALINE HCL 100 MG PO TABS: 200.0000 mg | ORAL_TABLET | Freq: Every day | ORAL | 1 refills | Status: DC

## 2020-10-27 MED ORDER — PANTOPRAZOLE SODIUM 40 MG PO TBEC: 40.0000 mg | DELAYED_RELEASE_TABLET | Freq: Every day | ORAL | 3 refills | Status: DC

## 2020-10-27 NOTE — Progress Notes (Signed)
Subjective:    Patient ID: Cynthia [name], female    DOB: February 04, 1946, 75 y.o.   MRN: [id number]  DOS:  10/27/2020 Type of visit - description: Multiple symptoms.  Headaches: Are getting more frequent, typically wake her up in the morning, last few hours to all day. Mostly left-sided. Takes Tylenol, a muscle relaxant and sometimes tramadol.  Not feeling well emotionally, lost her husband several months ago, "my life is completely different than before". Feels more depression than anxiety.  Continue with knee pain, has gained some weight.  Also 7 months history of dysphagia. A friend had to perform the Heimlich maneuver to help her one time. Denies cough with eating. Dysphagia is mostly to solids particularly pills. He has a lot of heartburn, not taking pantoprazole. No weight loss   Wt Readings from Last 3 Encounters:  10/27/20 168 lb 4 oz (76.3 kg)  09/06/20 168 lb (76.2 kg)  07/28/20 168 lb (76.2 kg)     Review of Systems Denies chest pain or edema. Is trying to be active doing water exercises and to start a stretch class.   Past Medical History:  Diagnosis Date   Allergy    Anxiety    Arthritis    Blood transfusion without reported diagnosis    Constipation, chronic    DEGENERATIVE JOINT DISEASE 06/02/2010   Depression    Glaucoma 2014   Right eye   Headache(784.0)    Hyperlipidemia    hx of   Osteopenia    Psoriasis    PUD (peptic ulcer disease) 2012   Upper GI bleed d/t NSAIDs   RLS (restless legs syndrome)    robaxin prn    Past Surgical History:  Procedure Laterality Date   ANKLE FRACTURE SURGERY Left    surgery 12-11 (LEFT)   CESAREAN SECTION  1969    Allergies as of 10/27/2020       Reactions   Motrin [ibuprofen]    GI bleed   Pollen Extract    Statins    "delibitating pain all over body"   Sulfonamide Derivatives    Pt unsure of reaction   Ciprofloxacin Swelling, Rash        Medication List        Accurate as of October 27, 2020  11:59 PM. If you have any questions, ask your nurse or doctor.          STOP taking these medications    alendronate 70 MG tablet Commonly known as: Fosamax Stopped by: Kathlene November, MD   HYDROcodone-acetaminophen 5-325 MG tablet Commonly known as: NORCO/VICODIN Stopped by: Kathlene November, MD       TAKE these medications    buPROPion 300 MG 24 hr tablet Commonly known as: WELLBUTRIN XL Take 1 tablet (300 mg total) by mouth daily.   cetirizine 10 MG tablet Commonly known as: ZYRTEC Take 10 mg by mouth at bedtime as needed for allergies.   Emgality 120 MG/ML Soaj Generic drug: Galcanezumab-gnlm Inject 120 mg into the skin every 28 (twenty-eight) days.   ezetimibe 10 MG tablet Commonly known as: ZETIA Take 1 tablet (10 mg total) by mouth daily.   methocarbamol 500 MG tablet Commonly known as: ROBAXIN Take 1 tablet (500 mg total) by mouth 3 (three) times daily as needed for muscle spasms.   pantoprazole 40 MG tablet Commonly known as: PROTONIX Take 1 tablet (40 mg total) by mouth daily.   sertraline 100 MG tablet Commonly known as: ZOLOFT Take 2 tablets (200  mg total) by mouth daily. What changed: how much to take Changed by: Kathlene November, MD   traMADol 50 MG tablet Commonly known as: ULTRAM Take 1 tablet (50 mg total) by mouth every 6 (six) hours as needed.   Travoprost (BAK Free) 0.004 % Soln ophthalmic solution Commonly known as: TRAVATAN Place 1 drop into the right eye at bedtime.   verapamil 180 MG CR tablet Commonly known as: CALAN-SR TAKE ONE TABLET BY MOUTH EVERY NIGHT AT BEDTIME   zolpidem 5 MG tablet Commonly known as: AMBIEN Take 1 tablet (5 mg total) by mouth at bedtime as needed for sleep.           Objective:   Physical Exam BP (!) 144/82 (BP Location: Left Arm, Patient Position: Sitting, Cuff Size: Small)   Pulse 94   Temp 98.1 F (36.7 C) (Oral)   Resp 16   Ht 5\' 5"  (1.651 m)   Wt 168 lb 4 oz (76.3 kg)   SpO2 97%   BMI 28.00 kg/m   General:   Well developed, NAD, BMI noted.  HEENT:  Normocephalic . Face symmetric, atraumatic Lungs:  CTA B Normal respiratory effort, no intercostal retractions, no accessory muscle use. Heart: RRR,  no murmur.  Abdomen:  Not distended, soft, non-tender. No rebound or rigidity.   Skin: Not pale. Not jaundice Lower extremities: no pretibial edema bilaterally  Neurologic:  alert & oriented X3.  Speech normal, gait appropriate for age and unassisted Psych--  Cognition and judgment appear intact.  Cooperative with normal attention span and concentration.  Behavior appropriate. No anxious or depressed appearing.     Assessment     Assessment Hyperlipidemia: Lipitor causes myalgias, declined other statins Anxiety depression insomnia DJD Headaches: verapamil-tramadol Osteoporosis: 07-2014 T score of -1.7, T score (November 2021) -2.8: sports medicine Rx Prolia PUD due to NSAIDs RLS - robaxin  prn Glaucoma Chronic constipation Skin psoriasis , Dx LSC (lichen )  ~ 1610 , sees derm   PLAN: Anxiety, depression, insomnia: See description of symptoms, although PHQ-9 is only 9, I think she needs more help. Plan: Continue Wellbutrin, increase sertraline gradually from 100 mg daily to 200 mg daily.  Continue talking with a therapist.  Check TSH, reassess in 3 months GERD: Not taking PPIs, admits to frequent heartburn, having dysphagia to solids particularly pills, choked once.  Plan: Restart PPIs, RF sent, refer to GI, CBC Headaches: Not better.  Recommend to discuss with neurology.  I noted normal MRI, MRA and sed rate on March 2022. Osteoporosis: On Prolia, check CMP RTC 3 months   This visit occurred during the SARS-CoV-2 public health emergency.  Safety protocols were in place, including screening questions prior to the visit, additional usage of staff PPE, and extensive cleaning of exam room while observing appropriate contact time as indicated for disinfecting solutions.

## 2020-10-27 NOTE — Patient Instructions (Signed)
Increase sertraline 100 mg: Take 1-1/2 tablet for 2 weeks Then take 2 tablets every day.  Please reach out to neurology about your headaches    GO TO THE LAB : Get the blood work     [place], [place] back for a checkup in 2 months

## 2020-10-28 ENCOUNTER — Encounter: Payer: Self-pay | Admitting: Internal Medicine

## 2020-10-28 NOTE — Assessment & Plan Note (Signed)
Anxiety, depression, insomnia: See description of symptoms, although PHQ-9 is only 9, I think she needs more help. Plan: Continue Wellbutrin, increase sertraline gradually from 100 mg daily to 200 mg daily.  Continue talking with a therapist.  Check TSH, reassess in 3 months GERD: Not taking PPIs, admits to frequent heartburn, having dysphagia to solids particularly pills, choked once.  Plan: Restart PPIs, RF sent, refer to GI, CBC Headaches: Not better.  Recommend to discuss with neurology.  I noted normal MRI, MRA and sed rate on March 2022. Osteoporosis: On Prolia, check CMP RTC 3 months

## 2020-11-08 ENCOUNTER — Encounter: Payer: Self-pay | Admitting: Family Medicine

## 2020-11-09 ENCOUNTER — Ambulatory Visit (HOSPITAL_BASED_OUTPATIENT_CLINIC_OR_DEPARTMENT_OTHER)
Admission: RE | Admit: 2020-11-09 | Discharge: 2020-11-09 | Disposition: A | Discharge status: Home / Self Care | Payer: Medicare PPO | Source: Ambulatory Visit | Attending: Family Medicine | Admitting: Family Medicine

## 2020-11-09 ENCOUNTER — Ambulatory Visit: Payer: Self-pay

## 2020-11-09 ENCOUNTER — Encounter: Payer: Self-pay | Admitting: Family Medicine

## 2020-11-09 ENCOUNTER — Ambulatory Visit: Payer: Medicare PPO | Admitting: Family Medicine

## 2020-11-09 ENCOUNTER — Other Ambulatory Visit: Payer: Self-pay

## 2020-11-09 VITALS — BP 120/72 | Ht 65.0 in | Wt 168.0 lb

## 2020-11-09 DIAGNOSIS — M79605 Pain in left leg: Secondary | ICD-10-CM

## 2020-11-09 DIAGNOSIS — M8430XA Stress fracture, unspecified site, initial encounter for fracture: Secondary | ICD-10-CM

## 2020-11-09 NOTE — Patient Instructions (Signed)
Good to see you Please try to partial or non weight bear on the left leg  Please let me know the information about the physical therapist  I will call with the xray results.   Please send me a message in MyChart with any questions or updates.  Please see me back in 4 weeks.   --Dr. Raeford Razor

## 2020-11-09 NOTE — Progress Notes (Signed)
Cynthia [name] - 75 y.o. female MRN [id number]  Date of birth: 19-May-1945  SUBJECTIVE:  Including CC & ROS.  No chief complaint on file.   Cynthia [name] is a 75 y.o. female that is presenting with acute tibial pain.  The pain has been ongoing for a few weeks.  It is anterior in nature and midshaft.  No swelling or ecchymosis.  Seems worse when she is on her feet for longer periods of time.  Denies any knee pain at this point.   Review of Systems See HPI   HISTORY: Past Medical, Surgical, Social, and Family History Reviewed & Updated per EMR.   Pertinent Historical Findings include:  Past Medical History:  Diagnosis Date   Allergy    Anxiety    Arthritis    Blood transfusion without reported diagnosis    Constipation, chronic    DEGENERATIVE JOINT DISEASE 06/02/2010   Depression    Glaucoma 2014   Right eye   Headache(784.0)    Hyperlipidemia    hx of   Osteopenia    Psoriasis    PUD (peptic ulcer disease) 2012   Upper GI bleed d/t NSAIDs   RLS (restless legs syndrome)    robaxin prn    Past Surgical History:  Procedure Laterality Date   ANKLE FRACTURE SURGERY Left    surgery 12-11 (LEFT)   CESAREAN SECTION  1969    Family History  Problem Relation Age of Onset   Aneurysm Mother        brain   Celiac disease Brother    Diabetes Maternal Grandfather    Hypertension Neg Hx    Breast cancer Neg Hx    Coronary artery disease Neg Hx    Colon cancer Neg Hx    Esophageal cancer Neg Hx    Stomach cancer Neg Hx    Rectal cancer Neg Hx     Social History   Socioeconomic History   Marital status: Widowed    Spouse name: Not on file   Number of children: 1   Years of education: Not on file   Highest education level: Doctorate  Occupational History   Occupation: retired VP of Tax inspector: RETIRED  Tobacco Use   Smoking status: Never   Smokeless tobacco: Never  Vaping Use   Vaping Use: Never used  Substance and Sexual Activity   Alcohol use:  Yes    Comment: socially   Drug use: No   Sexual activity: Not on file  Other Topics Concern   Not on file  Social History Narrative   2 step children, 1 child , 7 GK   Lost husband, moved [place] 02-13-2020 ; apartment, 3th floor w/ elevator      Pt is right handed   Social Determinants of Radio broadcast assistant Strain: Not on file  Food Insecurity: Not on file  Transportation Needs: Not on file  Physical Activity: Not on file  Stress: Not on file  Social Connections: Not on file  Intimate Partner Violence: Not on file     PHYSICAL EXAM:  VS: BP 120/72 (BP Location: Left Arm, Patient Position: Sitting, Cuff Size: Normal)   Ht 5\' 5"  (1.651 m)   Wt 168 lb (76.2 kg)   BMI 27.96 kg/m  Physical Exam Gen: NAD, alert, cooperative with exam, well-appearing MSK:  Left tibia: Tenderness to palpation at the midshaft. No swelling or ecchymosis. Normal knee range of motion. Neurovascular intact  Limited  ultrasound: Left knee and leg:  Moderate effusion of the suprapatellar pouch. Mild degenerative change of the medial meniscus. No cortex change of the tibial shaft but increased hyperemia over the fibular side of the midshaft of the tibia. No changes of the fibular shaft.  Summary: Effusion of knee and stress reaction changes of the midshaft tibia.  Ultrasound and interpretation by Clearance Coots, MD    ASSESSMENT & PLAN:   Stress reaction of bone Symptoms seem most consistent with a stress change of the tibia.  Has instability at the pelvis that is contributing to her symptoms.  Her knee does have an effusion but no significant pain at the knee. -Counseled on home exercise therapy and supportive care. -X-ray. -Can refer to physical therapy. -Counseled on partial weightbearing.

## 2020-11-09 NOTE — Assessment & Plan Note (Signed)
Symptoms seem most consistent with a stress change of the tibia.  Has instability at the pelvis that is contributing to her symptoms.  Her knee does have an effusion but no significant pain at the knee. -Counseled on home exercise therapy and supportive care. -X-ray. -Can refer to physical therapy. -Counseled on partial weightbearing.

## 2020-11-10 ENCOUNTER — Telehealth: Payer: Self-pay | Admitting: Family Medicine

## 2020-11-10 ENCOUNTER — Encounter: Payer: Self-pay | Admitting: Family Medicine

## 2020-11-10 NOTE — Telephone Encounter (Signed)
Informed of results.   Rosemarie Ax, MD [facility] 11/10/2020, 2:13 PM

## 2020-11-11 ENCOUNTER — Telehealth: Payer: Self-pay

## 2020-11-11 ENCOUNTER — Encounter: Payer: Self-pay | Admitting: Nurse Practitioner

## 2020-11-11 ENCOUNTER — Ambulatory Visit (INDEPENDENT_AMBULATORY_CARE_PROVIDER_SITE_OTHER): Payer: Medicare PPO | Admitting: Nurse Practitioner

## 2020-11-11 ENCOUNTER — Telehealth: Payer: Self-pay | Admitting: Family Medicine

## 2020-11-11 VITALS — BP 126/82 | HR 102 | Ht 65.0 in | Wt 173.0 lb

## 2020-11-11 DIAGNOSIS — M8430XA Stress fracture, unspecified site, initial encounter for fracture: Secondary | ICD-10-CM

## 2020-11-11 DIAGNOSIS — Z86010 Personal history of colonic polyps: Secondary | ICD-10-CM

## 2020-11-11 DIAGNOSIS — R1319 Other dysphagia: Secondary | ICD-10-CM | POA: Diagnosis not present

## 2020-11-11 DIAGNOSIS — K219 Gastro-esophageal reflux disease without esophagitis: Secondary | ICD-10-CM | POA: Diagnosis not present

## 2020-11-11 NOTE — Telephone Encounter (Signed)
Cynthia [name] (Key: [facility])  Your information has been submitted to [facility]. [employer] will review the request and will issue a decision, typically within 3-7 days from your submission. You can check the updated outcome later by reopening this request.  If [employer] has not responded in 3-7 days or if you have any questions about your ePA request, please contact [employer] at [phone]. If you think there may be a problem with your PA request, use our live chat feature at the bottom right.  For [place] requests, please call [phone].

## 2020-11-11 NOTE — Patient Instructions (Signed)
Take Protonix 40 mg 1 tablet 30 minutes prior to lunch.   IMAGING:  You will be contacted by [facility] Scheduling (Your caller ID will indicate phone # [phone]) within the next business 7-10 business days to schedule your Barium Swallow. If you have not heard from them within 7-10 business days, please call [facility] at [phone] to follow up on the status of your appointment.    Will call with results.  If you are age 76 or older, your body mass index should be between 23-30. Your Body mass index is 28.79 kg/m. If this is out of the aforementioned range listed, please consider follow up with your Primary Care Provider.  If you are age 28 or younger, your body mass index should be between 19-25. Your Body mass index is 28.79 kg/m. If this is out of the aformentioned range listed, please consider follow up with your Primary Care Provider.   __________________________________________________________  The [facility] providers would like to encourage you to use [facility] to communicate with providers for non-urgent requests or questions.  Due to long hold times on the telephone, sending your provider a message by [facility] may be a faster and more efficient way to get a response.  Please allow 48 business hours for a response.  Please remember that this is for non-urgent requests.

## 2020-11-11 NOTE — Progress Notes (Signed)
ASSESSMENT AND PLAN    # 75 yo female with dysphagia to pills of various sizes over the last several months. She says she has no problems with food or liquids.  Weight is stable, actually up a few pounds. Esophageal dysmotility? Esophageal stenosis? To some degree it sound like problems may not be solely related to the size of the pill but also have something to do the type of coating on the pill.  -- For further evaluation will schedule patient for an esophagram.  Depending on results she may need an EGD.   # Chronic GERD. Recent heartburn but was off Protonix for a long time. Resumed protonix a week ago.  --Patient does not always eat breakfast.  I asked her to take the Protonix 30 minutes before lunch on a daily basis  # History of adenomatous colon polyps.  A small adenoma was removed at time of last colonoscopy in 2015 . A 7-year interval surveillance colonoscopy recommended for August 2022.   # History of anxiety/depression.  Treated with sertraline and bupropion.   # Frequent a.m. headaches, on Emgality  HISTORY OF PRESENT ILLNESS     Chief Complaint : problems swallowing pills  Cynthia [name] is a 75 y.o. female , known to Dr. Ardis Hughs. She has a past medical history significant for adenomatous colon polyps , chronic constipation , GERD , remote PUD, anxiety, depression, TIA, chronic headaches, osteoporosis, glaucoma, psoriasis, hyperlipidemia. See PMH below for any additional history.   Patient was last seen here in 2016. She is here today because of problems swallowing  pills of various sizes.  Last fall she got a pill stuck in her throat.  She had to call someone over for the Heimlich maneuver.  She had no further problems until this spring.  She has seasonal allergies but they were not bothering her when the dysphagia first started last fall A few months ago she got a potato chip stuck in her throat.  After massaging her throat and drinking water the chip passed.  Other than  that she has absolutely had no problem swallowing food nor fluids.  She has a longstanding history of GERD with heartburn. At one point she was on Protonix  but stopped it upon the advice of one of her providers.  Recently having heartburn and she restarted Protonix a week ago.     Data Reviewed: 10/27/2020 Hemoglobin 12.8 Mild eosinophilia TSH normal   PREVIOUS EVALUATIONS:   August 2015 colonoscopy Good prep.  A 3 to 4 mm sessile polyp was removed from the ascending colon.  Path compatible with a tubular adenoma   Past Medical History:  Diagnosis Date   Allergy    Anxiety    Arthritis    Blood transfusion without reported diagnosis    Constipation, chronic    DEGENERATIVE JOINT DISEASE 06/02/2010   Depression    Glaucoma 2014   Right eye   Headache(784.0)    Hyperlipidemia    hx of   Osteopenia    Psoriasis    PUD (peptic ulcer disease) 2012   Upper GI bleed d/t NSAIDs   RLS (restless legs syndrome)    robaxin prn     Past Surgical History:  Procedure Laterality Date   ANKLE FRACTURE SURGERY Left    surgery 12-11 (LEFT)   CESAREAN SECTION  1969   Family History  Problem Relation Age of Onset   Aneurysm Mother        brain  Celiac disease Brother    Diabetes Maternal Grandfather    Hypertension Neg Hx    Breast cancer Neg Hx    Coronary artery disease Neg Hx    Colon cancer Neg Hx    Esophageal cancer Neg Hx    Stomach cancer Neg Hx    Rectal cancer Neg Hx    Social History   Tobacco Use   Smoking status: Never    Passive exposure: Never   Smokeless tobacco: Never  Vaping Use   Vaping Use: Never used  Substance Use Topics   Alcohol use: Yes    Comment: socially   Drug use: No   Current Outpatient Medications  Medication Sig Dispense Refill   buPROPion (WELLBUTRIN XL) 300 MG 24 hr tablet Take 1 tablet (300 mg total) by mouth daily. 90 tablet 0   cetirizine (ZYRTEC) 10 MG tablet Take 10 mg by mouth at bedtime as needed for allergies.      ezetimibe (ZETIA) 10 MG tablet Take 1 tablet (10 mg total) by mouth daily. 90 tablet 0   Galcanezumab-gnlm (EMGALITY) 120 MG/ML SOAJ Inject 120 mg into the skin every 28 (twenty-eight) days. 1.12 mL 5   methocarbamol (ROBAXIN) 500 MG tablet Take 1 tablet (500 mg total) by mouth 3 (three) times daily as needed for muscle spasms. 90 tablet 1   pantoprazole (PROTONIX) 40 MG tablet Take 1 tablet (40 mg total) by mouth daily. 30 tablet 3   sertraline (ZOLOFT) 100 MG tablet Take 2 tablets (200 mg total) by mouth daily. 180 tablet 1   traMADol (ULTRAM) 50 MG tablet Take 1 tablet (50 mg total) by mouth every 6 (six) hours as needed. 20 tablet 2   Travoprost, BAK Free, (TRAVATAN) 0.004 % SOLN ophthalmic solution Place 1 drop into the right eye at bedtime.     verapamil (CALAN-SR) 180 MG CR tablet TAKE ONE TABLET BY MOUTH EVERY NIGHT AT BEDTIME 90 tablet 4   zolpidem (AMBIEN) 5 MG tablet Take 1 tablet (5 mg total) by mouth at bedtime as needed for sleep. 30 tablet 1   No current facility-administered medications for this visit.   Allergies  Allergen Reactions   Motrin [Ibuprofen]     GI bleed   Pollen Extract    Statins     "delibitating pain all over body"   Sulfonamide Derivatives     Pt unsure of reaction   Ciprofloxacin Swelling and Rash     Review of Systems: Positive for frequent morning headaches.  All other systems reviewed and negative except where noted in HPI.    PHYSICAL EXAM :    Wt Readings from Last 3 Encounters:  11/11/20 173 lb (78.5 kg)  11/09/20 168 lb (76.2 kg)  10/27/20 168 lb 4 oz (76.3 kg)    BP 126/82   Pulse (!) 102   Ht 5\' 5"  (1.651 m)   Wt 173 lb (78.5 kg)   SpO2 97%   BMI 28.79 kg/m  Constitutional:  Pleasant female in no acute distress. Psychiatric: Normal mood and affect. Behavior is normal. EENT: Pupils normal.  Conjunctivae are normal. No scleral icterus. Neck supple.  Cardiovascular: Normal rate, regular rhythm. No edema Pulmonary/chest: Effort  normal and breath sounds normal. No wheezing, rales or rhonchi. Abdominal: Soft, nondistended, nontender. Bowel sounds active throughout. There are no masses palpable. No hepatomegaly. Neurological: Alert and oriented to person place and time. Skin: Skin is warm and dry. No rashes noted.  Tye Savoy, NP  11/11/2020, 2:43  PM  Cc:  Referring Provider Colon Branch, MD

## 2020-11-11 NOTE — Telephone Encounter (Signed)
PT order faxed to number below.

## 2020-11-11 NOTE — Telephone Encounter (Signed)
Patient called with fax# [facility] .  Fax # [phone]  Pls fax order for PT to that number.  -glh

## 2020-11-12 NOTE — Progress Notes (Signed)
I agree with the above note, plan 

## 2020-11-16 ENCOUNTER — Ambulatory Visit: Payer: Medicare PPO | Admitting: Family

## 2020-11-16 ENCOUNTER — Ambulatory Visit (HOSPITAL_BASED_OUTPATIENT_CLINIC_OR_DEPARTMENT_OTHER)
Admission: RE | Admit: 2020-11-16 | Discharge: 2020-11-16 | Disposition: A | Discharge status: Home / Self Care | Payer: Medicare PPO | Source: Ambulatory Visit | Attending: Family | Admitting: Family

## 2020-11-16 ENCOUNTER — Ambulatory Visit: Payer: Medicare PPO

## 2020-11-16 ENCOUNTER — Other Ambulatory Visit: Payer: Self-pay

## 2020-11-16 ENCOUNTER — Encounter: Payer: Self-pay | Admitting: Family

## 2020-11-16 VITALS — BP 138/80 | HR 84 | Temp 98.5°F | Ht 65.0 in | Wt 168.6 lb

## 2020-11-16 DIAGNOSIS — Z8709 Personal history of other diseases of the respiratory system: Secondary | ICD-10-CM | POA: Diagnosis not present

## 2020-11-16 DIAGNOSIS — R197 Diarrhea, unspecified: Secondary | ICD-10-CM | POA: Insufficient documentation

## 2020-11-16 DIAGNOSIS — R110 Nausea: Secondary | ICD-10-CM | POA: Diagnosis not present

## 2020-11-16 DIAGNOSIS — R109 Unspecified abdominal pain: Secondary | ICD-10-CM

## 2020-11-16 DIAGNOSIS — M4186 Other forms of scoliosis, lumbar region: Secondary | ICD-10-CM | POA: Diagnosis not present

## 2020-11-16 NOTE — Patient Instructions (Signed)
Food Choices to Help Relieve Diarrhea, Adult Diarrhea can make you feel weak and cause you to become dehydrated. It is important to choose the right foods and drinks to: Relieve diarrhea. Replace lost fluids and nutrients. Prevent dehydration. What are tips for following this plan? Relieving diarrhea Avoid foods that make your diarrhea worse. These may include: Foods and beverages sweetened with high-fructose corn syrup, honey, or sweeteners such as xylitol, sorbitol, and mannitol. Fried, greasy, or spicy foods. Raw fruits and vegetables. Eat foods that are rich in probiotics. These include foods such as yogurt and fermented milk products. Probiotics can help increase healthy bacteria in your stomach and intestines (gastrointestinal tract or GI tract). This may help digestion and stop diarrhea. If you have lactose intolerance, avoid dairy products. These may make your diarrhea worse. Take medicine to help stop diarrhea only as told by your health care provider. Replacing nutrients  Eat bland, easy-to-digest foods in small amounts as you are able, until your diarrhea starts to get better. These foods include bananas, applesauce, rice, toast, and crackers. Gradually reintroduce nutrient-rich foods as tolerated or as told by your health care provider. This includes: Well-cooked protein foods, such as eggs, lean meats like fish or chicken without skin, and tofu. Peeled, seeded, and soft-cooked fruits and vegetables. Low-fat dairy products. Whole grains. Take vitamin and mineral supplements as told by your health care provider.  Preventing dehydration  Start by sipping water or a solution to prevent dehydration (oral rehydration solution, ORS). This is a drink that helps replace fluids and minerals your body has lost. You can buy an ORS at pharmacies and retail stores. Try to drink at least 8-10 cups (2,000-2,500 mL) of fluid each day to help replace lost fluids. If you have urine that is pale  yellow, you are getting enough fluids. You may drink other liquids in addition to water, such as fruit juice that you have added water to (diluted fruit juice) or low-calorie sports drinks, as tolerated or as told by your health care provider. Avoid drinks with caffeine, such as coffee, tea, or soft drinks. Avoid alcohol.  Summary When you have diarrhea, it is important to choose the right foods and drinks to relieve diarrhea, to replace lost fluids and nutrients, and to prevent dehydration. Make sure you drink enough fluid to keep your urine pale yellow. You may benefit from eating bland foods at first. Gradually reintroduce healthy, nutrient-rich foods as tolerated or as told by your health care provider. Avoid foods that make your diarrhea worse, such as fried, greasy, or spicy foods. This information is not intended to replace advice given to you by your health care provider. Make sure you discuss any questions you have with your healthcare provider. Document Revised: 06/17/2019 Document Reviewed: 06/17/2019 Elsevier Patient Education  2022 [employer].

## 2020-11-16 NOTE — Telephone Encounter (Signed)
A user error has taken place: encounter opened in error, closed for administrative reasons.

## 2020-11-16 NOTE — Progress Notes (Signed)
Cynthia [name] is a 75 y.o. female with the following history as recorded in EpicCare:  Patient Active Problem List   Diagnosis Date Noted   Stress reaction of bone 11/09/2020   Bone marrow edema 04/12/2020   OA (osteoarthritis) of knee 03/04/2020   Irritant contact dermatitis due to plants, except food 12/19/2019   Psoriasis 12/19/2019   TIA (transient ischemic attack) 09/14/2019   Insomnia 05/19/2019   Gastroesophageal reflux disease without esophagitis 05/02/2019   Essential tremor 50/27/7412   Lichen 87/86/7672   PCP NOTES >>>>>>>>>>>>>>>>>>>>>>>>>>>>>>>>>>> 03/25/2015   H/O adenomatous polyp of colon 05/18/2014   Annual physical exam 03/13/2011   Osteoporosis    Osteoarthritis 06/02/2010   Hyperlipidemia 11/09/2009   Migraine headache 06/28/2009   CONSTIPATION, CHRONIC 02/01/2007   Anxiety and depression 08/17/2006   ALLERGIC RHINITIS 08/17/2006    Current Outpatient Medications  Medication Sig Dispense Refill   buPROPion (WELLBUTRIN XL) 300 MG 24 hr tablet Take 1 tablet (300 mg total) by mouth daily. 90 tablet 0   cetirizine (ZYRTEC) 10 MG tablet Take 10 mg by mouth at bedtime as needed for allergies.     ezetimibe (ZETIA) 10 MG tablet Take 1 tablet (10 mg total) by mouth daily. 90 tablet 0   Galcanezumab-gnlm (EMGALITY) 120 MG/ML SOAJ Inject 120 mg into the skin every 28 (twenty-eight) days. 1.12 mL 5   methocarbamol (ROBAXIN) 500 MG tablet Take 1 tablet (500 mg total) by mouth 3 (three) times daily as needed for muscle spasms. 90 tablet 1   pantoprazole (PROTONIX) 40 MG tablet Take 1 tablet (40 mg total) by mouth daily. 30 tablet 3   sertraline (ZOLOFT) 100 MG tablet Take 2 tablets (200 mg total) by mouth daily. 180 tablet 1   traMADol (ULTRAM) 50 MG tablet Take 1 tablet (50 mg total) by mouth every 6 (six) hours as needed. 20 tablet 2   Travoprost, BAK Free, (TRAVATAN) 0.004 % SOLN ophthalmic solution Place 1 drop into the right eye at bedtime.     verapamil (CALAN-SR)  180 MG CR tablet TAKE ONE TABLET BY MOUTH EVERY NIGHT AT BEDTIME 90 tablet 4   zolpidem (AMBIEN) 5 MG tablet Take 1 tablet (5 mg total) by mouth at bedtime as needed for sleep. 30 tablet 1   No current facility-administered medications for this visit.    Allergies: Motrin [ibuprofen], Pollen extract, Statins, Sulfonamide derivatives, and Ciprofloxacin  Past Medical History:  Diagnosis Date   Allergy    Anxiety    Arthritis    Blood transfusion without reported diagnosis    Constipation, chronic    DEGENERATIVE JOINT DISEASE 06/02/2010   Depression    Glaucoma 2014   Right eye   Headache(784.0)    Hyperlipidemia    hx of   Osteopenia    Psoriasis    PUD (peptic ulcer disease) 2012   Upper GI bleed d/t NSAIDs   RLS (restless legs syndrome)    robaxin prn    Past Surgical History:  Procedure Laterality Date   ANKLE FRACTURE SURGERY Left    surgery 12-11 (LEFT)   CESAREAN SECTION  1969    Family History  Problem Relation Age of Onset   Aneurysm Mother        brain   Celiac disease Brother    Diabetes Maternal Grandfather    Hypertension Neg Hx    Breast cancer Neg Hx    Coronary artery disease Neg Hx    Colon cancer Neg Hx  Esophageal cancer Neg Hx    Stomach cancer Neg Hx    Rectal cancer Neg Hx     Social History   Tobacco Use   Smoking status: Never    Passive exposure: Never   Smokeless tobacco: Never  Substance Use Topics   Alcohol use: Yes    Comment: socially    Subjective:  Started on Saturday evening with sudden onset of diarrhea; did eat lunch with friend who ended up having episodes of diarrhea on Thursday- friend was better by next day; patient's symptoms have persisted x 3 days;  Feels full and bloated; did take COVID test this am- negative; has not used any OTC medications; no prior history of diverticulosis/ diverticulitis; denies any blood in the stool- notes that stool can look dark however;  No recent use of antibiotics; due for colonoscopy  later this summer- had polyp in 2015;  Does feel like her symptoms are slightly better today;      Objective:  Vitals:   11/16/20 1401  BP: 138/80  Pulse: 84  Temp: 98.5 F (36.9 C)  TempSrc: Oral  SpO2: 98%  Weight: 168 lb 9.6 oz (76.5 kg)  Height: _0  (1.651 m)    General: Well developed, well nourished, in no acute distress;  Skin : Warm and dry.  Head: Normocephalic and atraumatic  Eyes: Sclera and conjunctiva clear; pupils round and reactive to light; extraocular movements intact  Ears: External normal; canals clear; tympanic membranes normal  Oropharynx: Pink, supple. No suspicious lesions  Neck: Supple without thyromegaly, adenopathy  Lungs: Respirations unlabored; clear to auscultation bilaterally without wheeze, rales, rhonchi  CVS exam: normal rate and regular rhythm.  Abdomen: Soft; nontender; nondistended; normoactive bowel sounds; no masses or hepatosplenomegaly  Musculoskeletal: No deformities; no active joint inflammation  Neurologic: Alert and oriented; speech intact; face symmetrical; moves all extremities well; CNII-XII intact without focal deficit   Assessment:  1. Acute diarrhea   2. Abdominal pain, unspecified abdominal location     Plan:  ? If patient had same illness that her friend had and is now having symptoms of dehydration; BRAT diet/ Gatorade/ Pedialyte discussed; check X-ray to rule out obstruction; update labs today including stool culture; follow up to be determined;  This visit occurred during the SARS-CoV-2 public health emergency.  Safety protocols were in place, including screening questions prior to the visit, additional usage of staff PPE, and extensive cleaning of exam room while observing appropriate contact time as indicated for disinfecting solutions.    No follow-ups on file.  Orders Placed This Encounter  Procedures   DG Abd 2 Views    Standing Status:   Future    Number of Occurrences:   1    Standing Expiration Date:    11/16/2021    Order Specific Question:   Reason for Exam (SYMPTOM  OR DIAGNOSIS REQUIRED)    Answer:   diarrhea/ abdominal pain    Order Specific Question:   Preferred imaging location?    Answer:   [facility]   CBC with Differential/Platelet   Comp Met (CMET)   Amylase   Lipase   GI Profile, Stool, PCR    Standing Status:   Future    Standing Expiration Date:   11/16/2021    Requested Prescriptions    No prescriptions requested or ordered in this encounter

## 2020-11-17 ENCOUNTER — Other Ambulatory Visit (INDEPENDENT_AMBULATORY_CARE_PROVIDER_SITE_OTHER): Payer: Medicare PPO

## 2020-11-17 ENCOUNTER — Other Ambulatory Visit: Payer: Self-pay

## 2020-11-17 ENCOUNTER — Ambulatory Visit (INDEPENDENT_AMBULATORY_CARE_PROVIDER_SITE_OTHER): Payer: Medicare PPO | Admitting: *Deleted

## 2020-11-17 DIAGNOSIS — R197 Diarrhea, unspecified: Secondary | ICD-10-CM

## 2020-11-17 DIAGNOSIS — M816 Localized osteoporosis [Lequesne]: Secondary | ICD-10-CM

## 2020-11-17 DIAGNOSIS — R109 Unspecified abdominal pain: Secondary | ICD-10-CM | POA: Diagnosis not present

## 2020-11-17 LAB — COMPREHENSIVE METABOLIC PANEL
ALT: 16 U/L (ref 0–35)
AST: 19 U/L (ref 0–37)
Albumin: 4.6 g/dL (ref 3.5–5.2)
Alkaline Phosphatase: 139 U/L — ABNORMAL HIGH (ref 39–117)
BUN: 12 mg/dL (ref 6–23)
CO2: 26 mEq/L (ref 19–32)
Calcium: 9.9 mg/dL (ref 8.4–10.5)
Chloride: 101 mEq/L (ref 96–112)
Creatinine, Ser: 0.74 mg/dL (ref 0.40–1.20)
GFR: 78.6 mL/min (ref >60.00)
Glucose, Bld: 93 mg/dL (ref 70–99)
Potassium: 4.7 mEq/L (ref 3.5–5.1)
Sodium: 136 mEq/L (ref 135–145)
Total Bilirubin: 0.5 mg/dL (ref 0.2–1.2)
Total Protein: 6.8 g/dL (ref 6.0–8.3)

## 2020-11-17 LAB — LIPASE: Lipase: 26 U/L (ref 11.0–59.0)

## 2020-11-17 LAB — CBC WITH DIFFERENTIAL/PLATELET
Basophils Absolute: 0.1 10*3/uL (ref 0.0–0.1)
Basophils Relative: 1 % (ref 0.0–3.0)
Eosinophils Absolute: 0.7 10*3/uL (ref 0.0–0.7)
Eosinophils Relative: 9.1 % — ABNORMAL HIGH (ref 0.0–5.0)
HCT: 38.2 % (ref 36.0–46.0)
Hemoglobin: 12.9 g/dL (ref 12.0–15.0)
Lymphocytes Relative: 20.2 % (ref 12.0–46.0)
Lymphs Abs: 1.6 10*3/uL (ref 0.7–4.0)
MCHC: 33.9 g/dL (ref 30.0–36.0)
MCV: 93.5 fl (ref 78.0–100.0)
Monocytes Absolute: 0.9 10*3/uL (ref 0.1–1.0)
Monocytes Relative: 11.3 % (ref 3.0–12.0)
Neutro Abs: 4.6 10*3/uL (ref 1.4–7.7)
Neutrophils Relative %: 58.4 % (ref 43.0–77.0)
Platelets: 376 10*3/uL (ref 150.0–400.0)
RBC: 4.09 Mil/uL (ref 3.87–5.11)
RDW: 13.5 % (ref 11.5–15.5)
WBC: 7.9 10*3/uL (ref 4.0–10.5)

## 2020-11-17 LAB — AMYLASE: Amylase: 37 U/L (ref 27–131)

## 2020-11-17 NOTE — Progress Notes (Signed)
Patient here for her 2nd Evenity injection. Patient received 105 mg/1.17 mL X 2 injections SQ in bilateral arms. She tolerated injection well and will return once every 30 days for subsequent injections.

## 2020-11-18 ENCOUNTER — Other Ambulatory Visit: Payer: Self-pay | Admitting: Family

## 2020-11-18 DIAGNOSIS — R7989 Other specified abnormal findings of blood chemistry: Secondary | ICD-10-CM

## 2020-11-18 NOTE — Telephone Encounter (Signed)
Out-of-pocket cost due at time of visit: $35  Primary: [employer] [place] co-insurance: 0% Admin fee co-insurance: $35  Secondary: n/a [place] co-insurance:  Admin fee co-insurance:   Deductible: does not apply  Prior Auth: APPROVED PA# [id number] Valid: 10/01/20-10/01/21

## 2020-11-19 LAB — GI PROFILE, STOOL, PCR

## 2020-11-22 ENCOUNTER — Ambulatory Visit (HOSPITAL_COMMUNITY)
Admission: RE | Admit: 2020-11-22 | Discharge: 2020-11-22 | Disposition: A | Discharge status: Home / Self Care | Payer: Medicare PPO | Source: Ambulatory Visit | Attending: Nurse Practitioner | Admitting: Nurse Practitioner

## 2020-11-22 ENCOUNTER — Other Ambulatory Visit: Payer: Self-pay | Admitting: Nurse Practitioner

## 2020-11-22 ENCOUNTER — Other Ambulatory Visit: Payer: Self-pay

## 2020-11-22 DIAGNOSIS — K219 Gastro-esophageal reflux disease without esophagitis: Secondary | ICD-10-CM | POA: Insufficient documentation

## 2020-11-22 DIAGNOSIS — R1310 Dysphagia, unspecified: Secondary | ICD-10-CM | POA: Diagnosis not present

## 2020-11-25 DIAGNOSIS — M79662 Pain in left lower leg: Secondary | ICD-10-CM | POA: Diagnosis not present

## 2020-11-25 DIAGNOSIS — R2689 Other abnormalities of gait and mobility: Secondary | ICD-10-CM | POA: Diagnosis not present

## 2020-11-25 DIAGNOSIS — M6281 Muscle weakness (generalized): Secondary | ICD-10-CM | POA: Diagnosis not present

## 2020-11-29 NOTE — Progress Notes (Signed)
NEUROLOGY FOLLOW UP OFFICE NOTE  Cynthia [name] [id number]  Assessment/Plan:   1. Chronic migraine without aura, without status migrainosus, not intractable - over 3 consecutive months of over 15 headache days - failed verapamil, nortriptyline and topiramate - candidate for Botox. 2.  Transient left hemisensory loss.  TIA vs migraine.  As no prior history, must assume TIA. 3.  Small Acomm aneurysm   1.  Plan is to start Emgality.  It has been approved.  She will reach out to [facility].  If not a financially reasonable option (she has [employer]), will instead start Botox  She will discontinue verapamil 3.  Tramadol as needed.  Limit use of pain relievers to no more than 2 days out of week to prevent risk of rebound or medication-overuse headache. 4.  Keep headache diary 5.  Follow up 4 months.  Subjective:  Cynthia [name] is a 75 year old right-handed woman with depression, glaucoma, degenerative joint disease and history of TIA who follows up for headaches   UPDATE: Due to worsening headache, ordered an MRA of head .  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   Prescribed Emgality in March.  It was approved but she never picked it up.   Intensity:  moderate Duration:  3 to 5 hours but 3 have lasted all day from morning to night. Frequency:  20 days a month Current NSAIDS:  ASA 81mg  daily Current analgesics:  tramadol; acetaminophen-caffeine Current triptans:  none Current ergotamine:  no Current anti-emetic: Zofran Current muscle relaxants:  Robaxin 500mg  TID Current anti-anxiolytic:  no Current sleep aide:  Ambien Current Antihypertensive medications:  verapamil CR 180mg  Current Antidepressant medications:  Sertraline 100mg , bupropion 300mg  Current Anticonvulsant medications:  no Current anti-CGRP:  Emgality Current Vitamins/Herbal/Supplements:  B complex, Mg Current  Antihistamines/Decongestants:  Mucinex, Astelin Other therapy: 1 cup black coffee with melatonin 10mg  at bedtime Other medication: Zetia   No subsequent stroke-like symptoms.   Depression and anxiety: Yes Other pain: Joint pain   HISTORY:  She started having headaches infrequently in 2015 but progressively became more frequent in 2016.  Varies, but they are usually left sided, involving the ear, but also may be right sided (involving the sinuses) or in band-like distribution.  It is both throbbing and non-throbbing.  Initially, it is usually 5/10 but severe episodes are 7-8/10.  Sometimes there is nausea.  There is phonophobia.  There is no associated photophobia, visual disturbance or unilateral numbness or weakness.  Initially, it lasts a couple of hours and occuring 15 days out of the month (3 to 4 days severe).  It wakes her up at 4:30 to 5 AM.  She reports sensation of congestion but no runny nose.  CT of sinuses showed opacified right sphenoid sinus and ethmoid air cell.  She was treated a couple of times with Augmentin and prednisone, which was effective for a while but headaches then returned.  She cannot think of a trigger.  They are worse during the summer.  There are no aggravating or relieving factors.  MRI of brain from 06/10/15 was normal.  Sed Rate from 12/27/15 was 10.     She was admitted to [facility] on 09/14/2019 for TIA presenting as transient left sided hemibody numbness (first leg, then arm, then face).  Mostly resolved quickly but lingered for several hours.  CT head showed right internal capsule hypodensity but follow up  MRI of brain showed no acute infarct.  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  2D echo showed EF 60-65% with no source of embolus.  LDL was 162 and Hgb A1c was 5.2.  She was started on ASA 81mg  daily and Plavix 75mg  for 3 weeks followed by ASA alone.  The next morning she had a habitual migraine headache except it lasted all day.  She was also  started on Zetia (she has statin intolerance).  Unclear if event was TIA or complicated migraine.   Past NSAIDS:  ibuprofen (ulcers) Past analgesics:  Tylenol Past abortive triptans:  sumatriptan (advised to discontinue due to age) Past abortive ergotamine:  no Past muscle relaxants:  no Past anti-emetic:  no Past antihypertensive medications:  no Past antidepressant medications:  Nortriptyline 50mg  Past anticonvulsant medications:  topiramate 25mg  (concerned about glaucoma) Past anti-CGRP:  no Past vitamins/Herbal/Supplements:  no Past antihistamines/decongestants:  no Other past therapies:  none  PAST MEDICAL HISTORY: Past Medical History:  Diagnosis Date   Allergy    Anxiety    Arthritis    Blood transfusion without reported diagnosis    Constipation, chronic    DEGENERATIVE JOINT DISEASE 06/02/2010   Depression    Glaucoma 2014   Right eye   Headache(784.0)    Hyperlipidemia    hx of   Osteopenia    Psoriasis    PUD (peptic ulcer disease) 2012   Upper GI bleed d/t NSAIDs   RLS (restless legs syndrome)    robaxin prn    MEDICATIONS: Current Outpatient Medications on File Prior to Visit  Medication Sig Dispense Refill   buPROPion (WELLBUTRIN XL) 300 MG 24 hr tablet Take 1 tablet (300 mg total) by mouth daily. 90 tablet 0   cetirizine (ZYRTEC) 10 MG tablet Take 10 mg by mouth at bedtime as needed for allergies.     ezetimibe (ZETIA) 10 MG tablet Take 1 tablet (10 mg total) by mouth daily. 90 tablet 0   Galcanezumab-gnlm (EMGALITY) 120 MG/ML SOAJ Inject 120 mg into the skin every 28 (twenty-eight) days. 1.12 mL 5   methocarbamol (ROBAXIN) 500 MG tablet Take 1 tablet (500 mg total) by mouth 3 (three) times daily as needed for muscle spasms. 90 tablet 1   pantoprazole (PROTONIX) 40 MG tablet Take 1 tablet (40 mg total) by mouth daily. 30 tablet 3   sertraline (ZOLOFT) 100 MG tablet Take 2 tablets (200 mg total) by mouth daily. 180 tablet 1   traMADol (ULTRAM) 50 MG  tablet Take 1 tablet (50 mg total) by mouth every 6 (six) hours as needed. 20 tablet 2   Travoprost, BAK Free, (TRAVATAN) 0.004 % SOLN ophthalmic solution Place 1 drop into the right eye at bedtime.     verapamil (CALAN-SR) 180 MG CR tablet TAKE ONE TABLET BY MOUTH EVERY NIGHT AT BEDTIME 90 tablet 4   zolpidem (AMBIEN) 5 MG tablet Take 1 tablet (5 mg total) by mouth at bedtime as needed for sleep. 30 tablet 1   No current facility-administered medications on file prior to visit.    ALLERGIES: Allergies  Allergen Reactions   Motrin [Ibuprofen]     GI bleed   Pollen Extract    Statins     "delibitating pain all over body"   Sulfonamide Derivatives     Pt unsure of reaction   Ciprofloxacin Swelling and Rash    FAMILY HISTORY: Family History  Problem Relation Age of Onset   Aneurysm Mother  brain   Celiac disease Brother    Diabetes Maternal Grandfather    Hypertension Neg Hx    Breast cancer Neg Hx    Coronary artery disease Neg Hx    Colon cancer Neg Hx    Esophageal cancer Neg Hx    Stomach cancer Neg Hx    Rectal cancer Neg Hx       Objective:  Blood pressure 115/78, pulse 89, height 5\' 5"  (1.651 m), weight 169 lb 6.4 oz (76.8 kg), SpO2 94 %. General: No acute distress.  Patient appears well-groomed.    Metta Clines, DO  CC: Kathlene November, MD

## 2020-12-01 ENCOUNTER — Encounter (HOSPITAL_COMMUNITY): Payer: Self-pay | Admitting: *Deleted

## 2020-12-01 ENCOUNTER — Ambulatory Visit: Payer: Medicare PPO | Admitting: Neurology

## 2020-12-01 ENCOUNTER — Encounter: Payer: Self-pay | Admitting: Neurology

## 2020-12-01 ENCOUNTER — Other Ambulatory Visit: Payer: Self-pay

## 2020-12-01 VITALS — BP 115/78 | HR 89 | Ht 65.0 in | Wt 169.4 lb

## 2020-12-01 DIAGNOSIS — R2689 Other abnormalities of gait and mobility: Secondary | ICD-10-CM | POA: Diagnosis not present

## 2020-12-01 DIAGNOSIS — M6281 Muscle weakness (generalized): Secondary | ICD-10-CM | POA: Diagnosis not present

## 2020-12-01 DIAGNOSIS — G43709 Chronic migraine without aura, not intractable, without status migrainosus: Secondary | ICD-10-CM

## 2020-12-01 DIAGNOSIS — I671 Cerebral aneurysm, nonruptured: Secondary | ICD-10-CM | POA: Diagnosis not present

## 2020-12-01 DIAGNOSIS — M79662 Pain in left lower leg: Secondary | ICD-10-CM | POA: Diagnosis not present

## 2020-12-01 NOTE — Patient Instructions (Signed)
Find out from [facility] how much Emgality will cost you per month.  If it is not a financially reasonable option, we will pursue Botox Limit use of pain relievers to no more than 2 days out of week to prevent risk of rebound or medication-overuse headache. 3.  Keep headache diary 4.  Stop verapamil 5.  Follow up in 6 months (or sooner for Botox)

## 2020-12-01 NOTE — Progress Notes (Signed)
Initiated PA on [facility] portal  Patient Name  Cynthia [name], Cynthia [name] Submission Date  12/01/2020 Service Request #  [id number] Type Prior Authorization Location [facility] Prescriber  Metta Clines Status  In Progress   Waiting for reply

## 2020-12-06 DIAGNOSIS — M79662 Pain in left lower leg: Secondary | ICD-10-CM | POA: Diagnosis not present

## 2020-12-06 DIAGNOSIS — R2689 Other abnormalities of gait and mobility: Secondary | ICD-10-CM | POA: Diagnosis not present

## 2020-12-06 DIAGNOSIS — M6281 Muscle weakness (generalized): Secondary | ICD-10-CM | POA: Diagnosis not present

## 2020-12-09 ENCOUNTER — Encounter: Payer: Self-pay | Admitting: Family Medicine

## 2020-12-09 ENCOUNTER — Other Ambulatory Visit: Payer: Self-pay

## 2020-12-09 ENCOUNTER — Ambulatory Visit: Payer: Medicare PPO | Admitting: Family Medicine

## 2020-12-09 DIAGNOSIS — M8430XA Stress fracture, unspecified site, initial encounter for fracture: Secondary | ICD-10-CM

## 2020-12-09 DIAGNOSIS — M1712 Unilateral primary osteoarthritis, left knee: Secondary | ICD-10-CM

## 2020-12-09 NOTE — Progress Notes (Signed)
Cynthia [name] - 75 y.o. female MRN [id number]  Date of birth: 1945-12-29  SUBJECTIVE:  Including CC & ROS.  No chief complaint on file.   Cynthia [name] is a 75 y.o. female that is following up for her left knee and left leg. Pain has improved with physical therapy. Still gets welling in the knee.    Review of Systems See HPI   HISTORY: Past Medical, Surgical, Social, and Family History Reviewed & Updated per EMR.   Pertinent Historical Findings include:  Past Medical History:  Diagnosis Date   Allergy    Anxiety    Arthritis    Blood transfusion without reported diagnosis    Constipation, chronic    DEGENERATIVE JOINT DISEASE 06/02/2010   Depression    Glaucoma 2014   Right eye   Headache(784.0)    Hyperlipidemia    hx of   Osteopenia    Psoriasis    PUD (peptic ulcer disease) 2012   Upper GI bleed d/t NSAIDs   RLS (restless legs syndrome)    robaxin prn    Past Surgical History:  Procedure Laterality Date   ANKLE FRACTURE SURGERY Left    surgery 12-11 (LEFT)   CESAREAN SECTION  1969    Family History  Problem Relation Age of Onset   Aneurysm Mother        brain   Celiac disease Brother    Diabetes Maternal Grandfather    Hypertension Neg Hx    Breast cancer Neg Hx    Coronary artery disease Neg Hx    Colon cancer Neg Hx    Esophageal cancer Neg Hx    Stomach cancer Neg Hx    Rectal cancer Neg Hx     Social History   Socioeconomic History   Marital status: Widowed    Spouse name: Not on file   Number of children: 1   Years of education: Not on file   Highest education level: Doctorate  Occupational History   Occupation: retired VP of Tax inspector: RETIRED  Tobacco Use   Smoking status: Never    Passive exposure: Never   Smokeless tobacco: Never  Vaping Use   Vaping Use: Never used  Substance and Sexual Activity   Alcohol use: Yes    Comment: socially   Drug use: No   Sexual activity: Not Currently  Other Topics Concern    Not on file  Social History Narrative   2 step children, 1 child , 7 GK   Lost husband, moved [place] 02-13-2020 ; apartment, 3th floor w/ elevator      Pt is right handed   Social Determinants of Radio broadcast assistant Strain: Not on file  Food Insecurity: Not on file  Transportation Needs: Not on file  Physical Activity: Not on file  Stress: Not on file  Social Connections: Not on file  Intimate Partner Violence: Not on file     PHYSICAL EXAM:  VS: BP 114/78 (BP Location: Left Arm, Patient Position: Sitting, Cuff Size: Normal)   Ht '5\' 5"'$  (1.651 m)   Wt 169 lb (76.7 kg)   BMI 28.12 kg/m  Physical Exam Gen: NAD, alert, cooperative with exam, well-appearing       ASSESSMENT & PLAN:   OA (osteoarthritis) of knee Acute on chronic in nature.  Still gets swelling intermittently. -Counseled on home exercise therapy and supportive care. -Could consider Zilretta or PRP  Stress reaction of bone Getting improvement with  her pain.  Has been going through physical therapy and gets more balance. -Counseled on home exercise therapy and supportive care. -Getting Evenity. -Continue physical therapy.

## 2020-12-09 NOTE — Assessment & Plan Note (Signed)
Getting improvement with her pain.  Has been going through physical therapy and gets more balance. -Counseled on home exercise therapy and supportive care. -Getting Evenity. -Continue physical therapy.

## 2020-12-09 NOTE — Assessment & Plan Note (Signed)
Acute on chronic in nature.  Still gets swelling intermittently. -Counseled on home exercise therapy and supportive care. -Could consider Zilretta or PRP

## 2020-12-10 DIAGNOSIS — M79662 Pain in left lower leg: Secondary | ICD-10-CM | POA: Diagnosis not present

## 2020-12-10 DIAGNOSIS — R2689 Other abnormalities of gait and mobility: Secondary | ICD-10-CM | POA: Diagnosis not present

## 2020-12-10 DIAGNOSIS — M6281 Muscle weakness (generalized): Secondary | ICD-10-CM | POA: Diagnosis not present

## 2020-12-13 DIAGNOSIS — M79662 Pain in left lower leg: Secondary | ICD-10-CM | POA: Diagnosis not present

## 2020-12-13 DIAGNOSIS — R2689 Other abnormalities of gait and mobility: Secondary | ICD-10-CM | POA: Diagnosis not present

## 2020-12-13 DIAGNOSIS — M6281 Muscle weakness (generalized): Secondary | ICD-10-CM | POA: Diagnosis not present

## 2020-12-14 NOTE — Progress Notes (Unsigned)
Cynthia [name] Key: [id number] - PA Case ID: [id number] - Rx #[id number] Need help? Call us at [phone] Status Sent to [facility] Emgality '120MG'$ /ML auto-injectors (migraine) Form [employer] Electronic PA Form Original Clai  Denied on 12/15/2020, going to appeal and have patient come get a starter pack. I left message to come get sample. New rx sent in for emgality. Will wait to see if denied.

## 2020-12-15 ENCOUNTER — Other Ambulatory Visit: Payer: Self-pay

## 2020-12-15 ENCOUNTER — Encounter: Payer: Self-pay | Admitting: *Deleted

## 2020-12-15 MED ORDER — EMGALITY 120 MG/ML ~~LOC~~ SOAJ: 120.0000 mg | SUBCUTANEOUS | 5 refills | Status: DC

## 2020-12-15 NOTE — Progress Notes (Signed)
Submitted via [facility] on 12/03/2020 received the following response.  [employer] submitted the requirements to the payer on 12/03/2020. [employer] attempted to contact the payer for status update on 12/10/2020 was unsuccessful. The Prior Authorization status is unknown at this time because the payer will not release receipt information or status information to a third party. The payer will only release information directly to the patient or provider. Please contact [facility] at [phone] for status information.   Will submit PA via [employer] next to see if yields a positive response

## 2020-12-16 DIAGNOSIS — M6281 Muscle weakness (generalized): Secondary | ICD-10-CM | POA: Diagnosis not present

## 2020-12-16 DIAGNOSIS — R2689 Other abnormalities of gait and mobility: Secondary | ICD-10-CM | POA: Diagnosis not present

## 2020-12-16 DIAGNOSIS — M79662 Pain in left lower leg: Secondary | ICD-10-CM | POA: Diagnosis not present

## 2020-12-21 ENCOUNTER — Ambulatory Visit (INDEPENDENT_AMBULATORY_CARE_PROVIDER_SITE_OTHER): Payer: Medicare PPO | Admitting: *Deleted

## 2020-12-21 ENCOUNTER — Encounter: Payer: Self-pay | Admitting: *Deleted

## 2020-12-21 ENCOUNTER — Other Ambulatory Visit: Payer: Self-pay

## 2020-12-21 DIAGNOSIS — M816 Localized osteoporosis [Lequesne]: Secondary | ICD-10-CM

## 2020-12-21 NOTE — Progress Notes (Signed)
Patient here for nurse visit for Evenity. Patient received bilateral sq injections. She tolerated injections well. She will return next month.

## 2020-12-23 DIAGNOSIS — M79662 Pain in left lower leg: Secondary | ICD-10-CM | POA: Diagnosis not present

## 2020-12-23 DIAGNOSIS — R2689 Other abnormalities of gait and mobility: Secondary | ICD-10-CM | POA: Diagnosis not present

## 2020-12-23 DIAGNOSIS — M6281 Muscle weakness (generalized): Secondary | ICD-10-CM | POA: Diagnosis not present

## 2020-12-23 NOTE — Telephone Encounter (Signed)
Pt received Evenity inj #3 12/21/20 Next Evenity inj due 01/22/21 Scheduled for 01/24/21

## 2020-12-30 DIAGNOSIS — R2689 Other abnormalities of gait and mobility: Secondary | ICD-10-CM | POA: Diagnosis not present

## 2020-12-30 DIAGNOSIS — M79662 Pain in left lower leg: Secondary | ICD-10-CM | POA: Diagnosis not present

## 2020-12-30 DIAGNOSIS — M6281 Muscle weakness (generalized): Secondary | ICD-10-CM | POA: Diagnosis not present

## 2021-01-03 ENCOUNTER — Encounter: Payer: Self-pay | Admitting: Family Medicine

## 2021-01-03 ENCOUNTER — Telehealth (INDEPENDENT_AMBULATORY_CARE_PROVIDER_SITE_OTHER): Payer: Medicare PPO | Admitting: Family Medicine

## 2021-01-03 VITALS — BP 155/85 | Temp 100.0°F

## 2021-01-03 DIAGNOSIS — R030 Elevated blood-pressure reading, without diagnosis of hypertension: Secondary | ICD-10-CM

## 2021-01-03 DIAGNOSIS — J069 Acute upper respiratory infection, unspecified: Secondary | ICD-10-CM

## 2021-01-03 NOTE — Progress Notes (Signed)
Virtual Visit via Video Note  I connected with Cynthia [name] on 01/03/21 at  4:00 PM EDT by a video enabled telemedicine application 2/2 [id number] pandemic and verified that I am speaking with the correct person using two identifiers.  Location patient: home Location provider:work or home office Persons participating in the virtual visit: patient, provider  I discussed the limitations of evaluation and management by telemedicine and the availability of in person appointments. The patient expressed understanding and agreed to proceed.   HPI: Pt with sore throat, n, Temp 100 F, cough, sneezing, and elevated bp.  Throat feels some what better today, but raw.  Pt does not have a h/o HTN.  BP 155/85 on several occasions today.  May be feeling some SOB.  Pt had a negative COVID test this am at her ALF's clinic.  Pt has not taken anything for her symptoms.  Denies sick contacts.  ROS: See pertinent positives and negatives per HPI.  Past Medical History:  Diagnosis Date   Allergy    Anxiety    Arthritis    Blood transfusion without reported diagnosis    Constipation, chronic    DEGENERATIVE JOINT DISEASE 06/02/2010   Depression    Glaucoma 2014   Right eye   Headache(784.0)    Hyperlipidemia    hx of   Osteopenia    Psoriasis    PUD (peptic ulcer disease) 2012   Upper GI bleed d/t NSAIDs   RLS (restless legs syndrome)    robaxin prn    Past Surgical History:  Procedure Laterality Date   ANKLE FRACTURE SURGERY Left    surgery 12-11 (LEFT)   CESAREAN SECTION  1969    Family History  Problem Relation Age of Onset   Aneurysm Mother        brain   Celiac disease Brother    Diabetes Maternal Grandfather    Hypertension Neg Hx    Breast cancer Neg Hx    Coronary artery disease Neg Hx    Colon cancer Neg Hx    Esophageal cancer Neg Hx    Stomach cancer Neg Hx    Rectal cancer Neg Hx      Current Outpatient Medications:    buPROPion (WELLBUTRIN XL) 300 MG 24 hr tablet, Take  1 tablet (300 mg total) by mouth daily., Disp: 90 tablet, Rfl: 0   cetirizine (ZYRTEC) 10 MG tablet, Take 10 mg by mouth at bedtime as needed for allergies., Disp: , Rfl:    ezetimibe (ZETIA) 10 MG tablet, Take 1 tablet (10 mg total) by mouth daily., Disp: 90 tablet, Rfl: 0   Galcanezumab-gnlm (EMGALITY) 120 MG/ML SOAJ, Inject 120 mg into the skin every 28 (twenty-eight) days., Disp: 1.12 mL, Rfl: 5   methocarbamol (ROBAXIN) 500 MG tablet, Take 1 tablet (500 mg total) by mouth 3 (three) times daily as needed for muscle spasms., Disp: 90 tablet, Rfl: 1   pantoprazole (PROTONIX) 40 MG tablet, Take 1 tablet (40 mg total) by mouth daily., Disp: 30 tablet, Rfl: 3   sertraline (ZOLOFT) 100 MG tablet, Take 2 tablets (200 mg total) by mouth daily., Disp: 180 tablet, Rfl: 1   traMADol (ULTRAM) 50 MG tablet, Take 1 tablet (50 mg total) by mouth every 6 (six) hours as needed., Disp: 20 tablet, Rfl: 2   Travoprost, BAK Free, (TRAVATAN) 0.004 % SOLN ophthalmic solution, Place 1 drop into the right eye at bedtime., Disp: , Rfl:    zolpidem (AMBIEN) 5 MG tablet, Take 1 tablet (  5 mg total) by mouth at bedtime as needed for sleep., Disp: 30 tablet, Rfl: 1   verapamil (CALAN-SR) 180 MG CR tablet, TAKE ONE TABLET BY MOUTH EVERY NIGHT AT BEDTIME, Disp: 90 tablet, Rfl: 4  EXAM:  VITALS per patient if applicable: RR between 123456 bpm.,  BP 155/85  GENERAL: alert, oriented, appears well and in no acute distress  HEENT: atraumatic, conjunctiva clear, no obvious abnormalities on inspection of external nose and ears.  Pharynx without exudate.  NECK: normal movements of the head and neck  LUNGS: on inspection no signs of respiratory distress, breathing rate appears normal, no obvious gross SOB, gasping or wheezing  CV: no obvious cyanosis  MS: moves all visible extremities without noticeable abnormality  PSYCH/NEURO: pleasant and cooperative, no obvious depression or anxiety, speech and thought processing grossly  intact  ASSESSMENT AND PLAN:  Discussed the following assessment and plan:  Viral upper respiratory illness -Possible causes of symptoms including viral nasopharyngitis, influenza, COVID-19 virus -Negative rapid COVID-19 test this am.  Consider PCR testing. -Strep pharyngitis less likely 2/2 presence of other symptoms including cough.  Consider rapid strep testing at ALF. -Discussed supportive care including gargling with warm salt water or Chloraseptic spray, OTC cough/cold medication without decongestant, rest, hydration -Given strict precautions  Elevated blood-pressure reading without diagnosis of hypertension -Increase p.o. hydration -Limit sodium intake -Avoid OTC cough/cold medications with decongestant -For continued elevation or increased symptoms notify clinic  Follow-up as needed with PCP   I discussed the assessment and treatment plan with the patient. The patient was provided an opportunity to ask questions and all were answered. The patient agreed with the plan and demonstrated an understanding of the instructions.   The patient was advised to call back or seek an in-person evaluation if the symptoms worsen or if the condition fails to improve as anticipated.   Billie Ruddy, MD

## 2021-01-06 DIAGNOSIS — M6281 Muscle weakness (generalized): Secondary | ICD-10-CM | POA: Diagnosis not present

## 2021-01-06 DIAGNOSIS — M79662 Pain in left lower leg: Secondary | ICD-10-CM | POA: Diagnosis not present

## 2021-01-06 DIAGNOSIS — R2689 Other abnormalities of gait and mobility: Secondary | ICD-10-CM | POA: Diagnosis not present

## 2021-01-11 DIAGNOSIS — M6281 Muscle weakness (generalized): Secondary | ICD-10-CM | POA: Diagnosis not present

## 2021-01-11 DIAGNOSIS — R2689 Other abnormalities of gait and mobility: Secondary | ICD-10-CM | POA: Diagnosis not present

## 2021-01-11 DIAGNOSIS — M79662 Pain in left lower leg: Secondary | ICD-10-CM | POA: Diagnosis not present

## 2021-01-14 DIAGNOSIS — K219 Gastro-esophageal reflux disease without esophagitis: Secondary | ICD-10-CM | POA: Diagnosis not present

## 2021-01-14 DIAGNOSIS — M79662 Pain in left lower leg: Secondary | ICD-10-CM | POA: Diagnosis not present

## 2021-01-14 DIAGNOSIS — M6281 Muscle weakness (generalized): Secondary | ICD-10-CM | POA: Diagnosis not present

## 2021-01-14 DIAGNOSIS — R1319 Other dysphagia: Secondary | ICD-10-CM | POA: Diagnosis not present

## 2021-01-14 DIAGNOSIS — R2689 Other abnormalities of gait and mobility: Secondary | ICD-10-CM | POA: Diagnosis not present

## 2021-01-17 ENCOUNTER — Encounter: Payer: Self-pay | Admitting: Gastroenterology

## 2021-01-18 DIAGNOSIS — M79662 Pain in left lower leg: Secondary | ICD-10-CM | POA: Diagnosis not present

## 2021-01-18 DIAGNOSIS — M6281 Muscle weakness (generalized): Secondary | ICD-10-CM | POA: Diagnosis not present

## 2021-01-18 DIAGNOSIS — K219 Gastro-esophageal reflux disease without esophagitis: Secondary | ICD-10-CM | POA: Diagnosis not present

## 2021-01-18 DIAGNOSIS — R1319 Other dysphagia: Secondary | ICD-10-CM | POA: Diagnosis not present

## 2021-01-18 DIAGNOSIS — R2689 Other abnormalities of gait and mobility: Secondary | ICD-10-CM | POA: Diagnosis not present

## 2021-01-21 DIAGNOSIS — R1319 Other dysphagia: Secondary | ICD-10-CM | POA: Diagnosis not present

## 2021-01-21 DIAGNOSIS — M79662 Pain in left lower leg: Secondary | ICD-10-CM | POA: Diagnosis not present

## 2021-01-21 DIAGNOSIS — R2689 Other abnormalities of gait and mobility: Secondary | ICD-10-CM | POA: Diagnosis not present

## 2021-01-21 DIAGNOSIS — M6281 Muscle weakness (generalized): Secondary | ICD-10-CM | POA: Diagnosis not present

## 2021-01-21 DIAGNOSIS — K219 Gastro-esophageal reflux disease without esophagitis: Secondary | ICD-10-CM | POA: Diagnosis not present

## 2021-01-24 ENCOUNTER — Ambulatory Visit (INDEPENDENT_AMBULATORY_CARE_PROVIDER_SITE_OTHER): Payer: Medicare PPO | Admitting: *Deleted

## 2021-01-24 ENCOUNTER — Ambulatory Visit: Payer: Medicare PPO | Admitting: Internal Medicine

## 2021-01-24 ENCOUNTER — Other Ambulatory Visit: Payer: Self-pay

## 2021-01-24 ENCOUNTER — Encounter: Payer: Self-pay | Admitting: Internal Medicine

## 2021-01-24 VITALS — BP 122/68 | HR 77 | Temp 98.4°F | Resp 16 | Ht 65.0 in | Wt 168.0 lb

## 2021-01-24 DIAGNOSIS — F32A Depression, unspecified: Secondary | ICD-10-CM | POA: Diagnosis not present

## 2021-01-24 DIAGNOSIS — F419 Anxiety disorder, unspecified: Secondary | ICD-10-CM | POA: Diagnosis not present

## 2021-01-24 DIAGNOSIS — K219 Gastro-esophageal reflux disease without esophagitis: Secondary | ICD-10-CM | POA: Diagnosis not present

## 2021-01-24 DIAGNOSIS — N898 Other specified noninflammatory disorders of vagina: Secondary | ICD-10-CM

## 2021-01-24 DIAGNOSIS — R7989 Other specified abnormal findings of blood chemistry: Secondary | ICD-10-CM | POA: Diagnosis not present

## 2021-01-24 DIAGNOSIS — G4700 Insomnia, unspecified: Secondary | ICD-10-CM | POA: Diagnosis not present

## 2021-01-24 DIAGNOSIS — M816 Localized osteoporosis [Lequesne]: Secondary | ICD-10-CM | POA: Diagnosis not present

## 2021-01-24 LAB — HEPATIC FUNCTION PANEL
ALT: 14 U/L (ref 0–35)
AST: 18 U/L (ref 0–37)
Albumin: 4.4 g/dL (ref 3.5–5.2)
Alkaline Phosphatase: 116 U/L (ref 39–117)
Bilirubin, Direct: 0.1 mg/dL (ref 0.0–0.3)
Total Bilirubin: 0.6 mg/dL (ref 0.2–1.2)
Total Protein: 6.5 g/dL (ref 6.0–8.3)

## 2021-01-24 LAB — GAMMA GT: GGT: 13 U/L (ref 7–51)

## 2021-01-24 NOTE — Progress Notes (Signed)
Subjective:    Patient ID: Cynthia [name], female    DOB: 1946/03/21, 75 y.o.   MRN: [id number]  DOS:  01/24/2021 Type of visit - description: Follow-up  Today with talk about anxiety, depression, migraine headaches, dysphagia.  She also reports vaginal discharge without any bleeding.  Alkaline phosphatase was a slightly elevated before, denies nausea or vomiting.   Review of Systems See above   Past Medical History:  Diagnosis Date   Allergy    Anxiety    Arthritis    Blood transfusion without reported diagnosis    Constipation, chronic    DEGENERATIVE JOINT DISEASE 06/02/2010   Depression    Glaucoma 2014   Right eye   Headache(784.0)    Hyperlipidemia    hx of   Osteopenia    Psoriasis    PUD (peptic ulcer disease) 2012   Upper GI bleed d/t NSAIDs   RLS (restless legs syndrome)    robaxin prn    Past Surgical History:  Procedure Laterality Date   ANKLE FRACTURE SURGERY Left    surgery 12-11 (LEFT)   CESAREAN SECTION  1969    Allergies as of 01/24/2021       Reactions   Motrin [ibuprofen]    GI bleed   Pollen Extract    Statins    "delibitating pain all over body"   Sulfonamide Derivatives    Pt unsure of reaction   Ciprofloxacin Swelling, Rash        Medication List        Accurate as of January 24, 2021  2:22 PM. If you have any questions, ask your nurse or doctor.          STOP taking these medications    verapamil 180 MG CR tablet Commonly known as: CALAN-SR Stopped by: Kathlene November, MD       TAKE these medications    buPROPion 300 MG 24 hr tablet Commonly known as: WELLBUTRIN XL Take 1 tablet (300 mg total) by mouth daily.   cetirizine 10 MG tablet Commonly known as: ZYRTEC Take 10 mg by mouth at bedtime as needed for allergies.   Emgality 120 MG/ML Soaj Generic drug: Galcanezumab-gnlm Inject 120 mg into the skin every 28 (twenty-eight) days.   Evenity 105 MG/1.17ML Sosy injection Generic drug: Romosozumab-aqqg Inject  210 mg into the skin every 30 (thirty) days.   ezetimibe 10 MG tablet Commonly known as: ZETIA Take 1 tablet (10 mg total) by mouth daily.   methocarbamol 500 MG tablet Commonly known as: ROBAXIN Take 1 tablet (500 mg total) by mouth 3 (three) times daily as needed for muscle spasms.   pantoprazole 40 MG tablet Commonly known as: PROTONIX Take 1 tablet (40 mg total) by mouth daily.   sertraline 100 MG tablet Commonly known as: ZOLOFT Take 2 tablets (200 mg total) by mouth daily.   traMADol 50 MG tablet Commonly known as: ULTRAM Take 1 tablet (50 mg total) by mouth every 6 (six) hours as needed.   Travoprost (BAK Free) 0.004 % Soln ophthalmic solution Commonly known as: TRAVATAN Place 1 drop into the right eye at bedtime.   zolpidem 5 MG tablet Commonly known as: AMBIEN Take 1 tablet (5 mg total) by mouth at bedtime as needed for sleep.           Objective:   Physical Exam BP 122/68 (BP Location: Left Arm, Patient Position: Sitting, Cuff Size: Small)   Pulse 77   Temp 98.4 F (36.9 C) (  Oral)   Resp 16   Ht '5\' 5"'$  (1.651 m)   Wt 168 lb (76.2 kg)   SpO2 97%   BMI 27.96 kg/m  General:   Well developed, NAD, BMI noted. HEENT:  Normocephalic . Face symmetric, atraumatic Lower extremities: no pretibial edema bilaterally  Skin: Not pale. Not jaundice Neurologic:  alert & oriented X3.  Speech normal, gait appropriate for age and unassisted Psych--  Cognition and judgment appear intact.  Cooperative with normal attention span and concentration.  Behavior appropriate. No anxious or depressed appearing.      Assessment      Assessment Hyperlipidemia: Lipitor causes myalgias, declined other statins Anxiety depression insomnia DJD Migraines, chronic: Saw neurology 12/01/2020: Failed verapamil, nortriptyline and topiramate. Rx Emgality.  Tramadol prn   Osteoporosis: 07-2014 T score of -1.7, T score (November 2021) -2.8: sports medicine Rx Prolia PUD due to  NSAIDs RLS - robaxin  prn Glaucoma Chronic constipation Skin psoriasis , Dx LSC (lichen )  ~ 123456 , sees derm   PLAN: Anxiety, depression, insomnia: See last visit, she continue with Wellbutrin, sertraline dose was increased, she is doing much better.  Continue present care, continue seeing a therapist. Increased alkaline phosphatase: Per chart review, will check LFTs, GGT.  Consider imaging. Headaches: Saw neurology 12/01/2020, for headaches, note that she failed verapamil, nortriptyline and topiramate. Options Botox, Emgality, elected Emgality. Also tramadol prn. Sxs much improved GERD, dysphagia: Since last visit, saw GI, had a barium swallow, was given advised to prevent dysphagia to pills (taken with yogurt,  etc.) sxs are about the same, encouraged to contact GI if symptoms continue, they were considering EGD. Also she is due for colonoscopy. Vaginal discharge: Refer to gynecology Vaccine advice provided RTC 3 months CPX  Time spent 30 minutes, multiple issues discussed, chart was reviewed regards headaches, GERD and dysphagia. Was referred to gynecology due to a new issue. This visit occurred during the SARS-CoV-2 public health emergency.  Safety protocols were in place, including screening questions prior to the visit, additional usage of staff PPE, and extensive cleaning of exam room while observing appropriate contact time as indicated for disinfecting solutions.

## 2021-01-24 NOTE — Assessment & Plan Note (Signed)
Anxiety, depression, insomnia: See last visit, she continue with Wellbutrin, sertraline dose was increased, she is doing much better.  Continue present care, continue seeing a therapist. Increased alkaline phosphatase: Per chart review, will check LFTs, GGT.  Consider imaging. Headaches: Saw neurology 12/01/2020, for headaches, note that she failed verapamil, nortriptyline and topiramate. Options Botox, Emgality, elected Emgality. Also tramadol prn. Sxs much improved GERD, dysphagia: Since last visit, saw GI, had a barium swallow, was given advised to prevent dysphagia to pills (taken with yogurt,  etc.) sxs are about the same, encouraged to contact GI if symptoms continue, they were considering EGD. Also she is due for colonoscopy. Vaginal discharge: Refer to gynecology Vaccine advice provided RTC 3 months CPX

## 2021-01-24 NOTE — Patient Instructions (Addendum)
Recommend to proceed with the following vaccines at your pharmacy:  Covid #5 Flu shot this fall  You are due for your repeat colonoscopy with Dr. Ardis Hughs. Please call his office at [phone] to get on his schedule.   GO TO THE LAB : Get the blood work     [facility], PLEASE SCHEDULE YOUR APPOINTMENTS Come back for a physical exam by December 2022

## 2021-01-24 NOTE — Progress Notes (Signed)
Patient is here for nurse visit for Evenity.  Patient received bilateral sq injections. She tolerated injections well. She will return next month.

## 2021-01-25 ENCOUNTER — Ambulatory Visit: Payer: Medicare PPO

## 2021-01-26 NOTE — Telephone Encounter (Addendum)
  Evenity Injection Schedule Inj #1 - 10/14/20 Inj #2 - 11/17/20 Inj #3 - 12/21/20 Inj #4 - 01/24/21 Inj #5 - 02/25/21 Inj #6 - 03/29/21 Inj #7 - scheduled 05/02/21 (reverify before January injection) Inj #8  Inj #9  Inj #10  Inj #11  Inj #12

## 2021-02-01 ENCOUNTER — Encounter: Payer: Self-pay | Admitting: Internal Medicine

## 2021-02-01 DIAGNOSIS — L089 Local infection of the skin and subcutaneous tissue, unspecified: Secondary | ICD-10-CM | POA: Diagnosis not present

## 2021-02-01 DIAGNOSIS — L400 Psoriasis vulgaris: Secondary | ICD-10-CM | POA: Diagnosis not present

## 2021-02-01 DIAGNOSIS — L408 Other psoriasis: Secondary | ICD-10-CM | POA: Diagnosis not present

## 2021-02-02 ENCOUNTER — Other Ambulatory Visit: Payer: Self-pay | Admitting: Internal Medicine

## 2021-02-02 DIAGNOSIS — K1379 Other lesions of oral mucosa: Secondary | ICD-10-CM

## 2021-02-02 MED ORDER — NYSTATIN 100000 UNIT/ML MT SUSP: 5.0000 mL | Freq: Four times a day (QID) | OROMUCOSAL | 0 refills | Status: DC

## 2021-02-10 DIAGNOSIS — H353132 Nonexudative age-related macular degeneration, bilateral, intermediate dry stage: Secondary | ICD-10-CM | POA: Diagnosis not present

## 2021-02-10 DIAGNOSIS — H401111 Primary open-angle glaucoma, right eye, mild stage: Secondary | ICD-10-CM | POA: Diagnosis not present

## 2021-02-10 DIAGNOSIS — H35363 Drusen (degenerative) of macula, bilateral: Secondary | ICD-10-CM | POA: Diagnosis not present

## 2021-02-15 ENCOUNTER — Telehealth: Payer: Self-pay | Admitting: Internal Medicine

## 2021-02-15 NOTE — Telephone Encounter (Signed)
Left message for patient to call back and schedule Medicare Annual Wellness Visit (AWV) in office.   If not able to come in office, please offer to do virtually or by telephone.  Left office number and my jabber [phone].  Last AWV:07/04/2016  Please schedule at anytime with Nurse Health Advisor.

## 2021-02-17 ENCOUNTER — Other Ambulatory Visit (HOSPITAL_BASED_OUTPATIENT_CLINIC_OR_DEPARTMENT_OTHER): Payer: Self-pay

## 2021-02-17 MED ORDER — INFLUENZA VAC A&B SA ADJ QUAD 0.5 ML IM PRSY
PREFILLED_SYRINGE | INTRAMUSCULAR | 0 refills | Status: DC
Start: 2021-02-17 — End: 2021-05-26
  Filled 2021-02-17: qty 0.5, 1d supply, fill #0

## 2021-02-21 ENCOUNTER — Other Ambulatory Visit: Payer: Self-pay | Admitting: Internal Medicine

## 2021-02-25 ENCOUNTER — Ambulatory Visit: Payer: Self-pay

## 2021-02-25 ENCOUNTER — Encounter: Payer: Self-pay | Admitting: Family Medicine

## 2021-02-25 ENCOUNTER — Ambulatory Visit: Payer: Medicare PPO | Admitting: Family Medicine

## 2021-02-25 VITALS — Ht 65.0 in | Wt 168.0 lb

## 2021-02-25 DIAGNOSIS — M1712 Unilateral primary osteoarthritis, left knee: Secondary | ICD-10-CM

## 2021-02-25 DIAGNOSIS — M816 Localized osteoporosis [Lequesne]: Secondary | ICD-10-CM | POA: Diagnosis not present

## 2021-02-25 NOTE — Progress Notes (Signed)
Cynthia [name] - 75 y.o. female MRN [id number]  Date of birth: [birth year]  SUBJECTIVE:  Including CC & ROS.  No chief complaint on file.   Cynthia [name] is a 75 y.o. female that is presenting with acute worsening of her left knee pain.  Having swelling over the knee.  No injury or inciting event.   Review of Systems See HPI   HISTORY: Past Medical, Surgical, Social, and Family History Reviewed & Updated per EMR.   Pertinent Historical Findings include:  Past Medical History:  Diagnosis Date   Allergy    Anxiety    Arthritis    Blood transfusion without reported diagnosis    Constipation, chronic    DEGENERATIVE JOINT DISEASE 06/02/2010   Depression    Glaucoma 2014   Right eye   Headache(784.0)    Hyperlipidemia    hx of   Osteopenia    Psoriasis    PUD (peptic ulcer disease) 2012   Upper GI bleed d/t NSAIDs   RLS (restless legs syndrome)    robaxin prn    Past Surgical History:  Procedure Laterality Date   ANKLE FRACTURE SURGERY Left    surgery 12-11 (LEFT)   CESAREAN SECTION  1969    Family History  Problem Relation Age of Onset   Aneurysm Mother        brain   Celiac disease Brother    Diabetes Maternal Grandfather    Hypertension Neg Hx    Breast cancer Neg Hx    Coronary artery disease Neg Hx    Colon cancer Neg Hx    Esophageal cancer Neg Hx    Stomach cancer Neg Hx    Rectal cancer Neg Hx     Social History   Socioeconomic History   Marital status: Widowed    Spouse name: Not on file   Number of children: 1   Years of education: Not on file   Highest education level: Doctorate  Occupational History   Occupation: retired VP of Tax inspector: RETIRED  Tobacco Use   Smoking status: Never    Passive exposure: Never   Smokeless tobacco: Never  Vaping Use   Vaping Use: Never used  Substance and Sexual Activity   Alcohol use: Yes    Comment: socially   Drug use: No   Sexual activity: Not Currently  Other Topics Concern    Not on file  Social History Narrative   2 step children, 1 child , 7 GK   Lost husband, moved [place] 02-13-2020 ; apartment, 3th floor w/ elevator      Pt is right handed   Social Determinants of Radio broadcast assistant Strain: Not on file  Food Insecurity: Not on file  Transportation Needs: Not on file  Physical Activity: Not on file  Stress: Not on file  Social Connections: Not on file  Intimate Partner Violence: Not on file     PHYSICAL EXAM:  VS: Ht 5\' 5"  (1.651 m)   Wt 168 lb (76.2 kg)   BMI 27.96 kg/m  Physical Exam Gen: NAD, alert, cooperative with exam, well-appearing     Aspiration/Injection Procedure Note Cynthia [name] 1945/12/16  Procedure: Aspiration and Injection Indications: Left knee pain  Procedure Details Consent: Risks of procedure as well as the alternatives and risks of each were explained to the (patient/caregiver).  Consent for procedure obtained. Time Out: Verified patient identification, verified procedure, site/side was marked, verified correct patient position, special  equipment/implants available, medications/allergies/relevent history reviewed, required imaging and test results available.  Performed.  The area was cleaned with iodine and alcohol swabs.    The left knee superior lateral suprapatellar pouch was injected using 3 cc of 1% lidocaine on a 25-gauge 1-1/2 inch needle.  An 18-gauge 1-1/2 needle was used to achieve aspiration.  The syringe was switched and a mixture containing 5 cc's of 32 mg Zilretta and 4 cc's of 0.25% bupivacaine was injected.  Ultrasound was used. Images were obtained in long views showing the injection.   Amount of Fluid Aspirated:  3mL Character of Fluid: clear and straw colored Fluid was sent for: n/a  A sterile dressing was applied.  Patient did tolerate procedure well.          ASSESSMENT & PLAN:   OA (osteoarthritis) of knee Acute on chronic in nature.  No recent injury inciting event.   We have tried other steroid injections and gel injections in the past with limited improvement. -Counseled on home exercise therapy and supportive care. -Zilretta injection today. -Could consider nerve block.

## 2021-02-25 NOTE — Patient Instructions (Signed)
Good to see you Please try ice as needed   Please send me a message in MyChart with any questions or updates.  Please see me back in 4 weeks or as needed if better.   --Dr. Daniya Aramburo  

## 2021-02-25 NOTE — Assessment & Plan Note (Signed)
Acute on chronic in nature.  No recent injury inciting event.  We have tried other steroid injections and gel injections in the past with limited improvement. -Counseled on home exercise therapy and supportive care. -Zilretta injection today. -Could consider nerve block.

## 2021-02-25 NOTE — Progress Notes (Signed)
Patient here for Evenity.  Patient received bilateral sq injections. She tolerated injections well. She will return next month for a nurse visit for her next Evenity.

## 2021-03-24 ENCOUNTER — Encounter (INDEPENDENT_AMBULATORY_CARE_PROVIDER_SITE_OTHER): Payer: Self-pay

## 2021-03-28 ENCOUNTER — Encounter: Payer: Self-pay | Admitting: Family Medicine

## 2021-03-28 ENCOUNTER — Ambulatory Visit: Payer: Medicare PPO | Admitting: Family Medicine

## 2021-03-28 ENCOUNTER — Other Ambulatory Visit: Payer: Self-pay

## 2021-03-28 ENCOUNTER — Other Ambulatory Visit (HOSPITAL_COMMUNITY)
Admission: RE | Admit: 2021-03-28 | Discharge: 2021-03-28 | Disposition: A | Discharge status: Home / Self Care | Payer: Medicare PPO | Source: Ambulatory Visit | Attending: Family Medicine | Admitting: Family Medicine

## 2021-03-28 VITALS — BP 134/88 | HR 76 | Temp 97.6°F | Resp 18 | Ht 65.0 in | Wt 169.4 lb

## 2021-03-28 DIAGNOSIS — R8290 Unspecified abnormal findings in urine: Secondary | ICD-10-CM

## 2021-03-28 DIAGNOSIS — N898 Other specified noninflammatory disorders of vagina: Secondary | ICD-10-CM | POA: Insufficient documentation

## 2021-03-28 LAB — POC URINALSYSI DIPSTICK (AUTOMATED)
Bilirubin, UA: NEGATIVE
Blood, UA: NEGATIVE
Glucose, UA: NEGATIVE
Ketones, UA: NEGATIVE
Nitrite, UA: NEGATIVE
Protein, UA: NEGATIVE
Spec Grav, UA: 1.01 (ref 1.010–1.025)
Urobilinogen, UA: 0.2 E.U./dL
pH, UA: 6 (ref 5.0–8.0)

## 2021-03-28 NOTE — Assessment & Plan Note (Signed)
Urine culture and genprobe pending  Suspect BV If symptoms worsen--- rto

## 2021-03-28 NOTE — Patient Instructions (Signed)

## 2021-03-28 NOTE — Progress Notes (Signed)
Subjective:   By signing my name below, I, Cynthia [name], attest that this documentation has been prepared under the direction and in the presence of Cynthia Held, DO. 03/28/2021    Patient ID: Cynthia [name], female    DOB: Jul 21, 1945, 75 y.o.   MRN: [id number]  Chief Complaint  Patient presents with   Vaginal Discharge    X6 weeks, Pt states no itching or burning.     HPI Patient is in today for an office visit.  She reports that for the past few weeks, she has had brown, smelly vaginal discharge that is "mucosy". The discharge is not consistent but she feels it when it is coming. She denies itching and dysuria.  Urinalysis is done at this visit.   Past Medical History:  Diagnosis Date   Allergy    Anxiety    Arthritis    Blood transfusion without reported diagnosis    Constipation, chronic    DEGENERATIVE JOINT DISEASE 06/02/2010   Depression    Glaucoma 2014   Right eye   Headache(784.0)    Hyperlipidemia    hx of   Osteopenia    Psoriasis    PUD (peptic ulcer disease) 2012   Upper GI bleed d/t NSAIDs   RLS (restless legs syndrome)    robaxin prn    Past Surgical History:  Procedure Laterality Date   ANKLE FRACTURE SURGERY Left    surgery 12-11 (LEFT)   CESAREAN SECTION  1969    Family History  Problem Relation Age of Onset   Aneurysm Mother        brain   Celiac disease Brother    Diabetes Maternal Grandfather    Hypertension Neg Hx    Breast cancer Neg Hx    Coronary artery disease Neg Hx    Colon cancer Neg Hx    Esophageal cancer Neg Hx    Stomach cancer Neg Hx    Rectal cancer Neg Hx     Social History   Socioeconomic History   Marital status: Widowed    Spouse name: Not on file   Number of children: 1   Years of education: Not on file   Highest education level: Doctorate  Occupational History   Occupation: retired VP of Tax inspector: RETIRED  Tobacco Use   Smoking status: Never    Passive exposure: Never    Smokeless tobacco: Never  Vaping Use   Vaping Use: Never used  Substance and Sexual Activity   Alcohol use: Yes    Comment: socially   Drug use: No   Sexual activity: Not Currently  Other Topics Concern   Not on file  Social History Narrative   2 step children, 1 child , 7 GK   Lost husband, moved [place] 02-13-2020 ; apartment, 3th floor w/ elevator      Pt is right handed   Social Determinants of Radio broadcast assistant Strain: Not on file  Food Insecurity: Not on file  Transportation Needs: Not on file  Physical Activity: Not on file  Stress: Not on file  Social Connections: Not on file  Intimate Partner Violence: Not on file    Outpatient Medications Prior to Visit  Medication Sig Dispense Refill   buPROPion (WELLBUTRIN XL) 300 MG 24 hr tablet TAKE ONE (1) TABLET BY MOUTH EVERY DAY 90 tablet 1   cetirizine (ZYRTEC) 10 MG tablet Take 10 mg by mouth at bedtime as needed for  allergies.     ezetimibe (ZETIA) 10 MG tablet Take 1 tablet (10 mg total) by mouth daily. 90 tablet 0   Galcanezumab-gnlm (EMGALITY) 120 MG/ML SOAJ Inject 120 mg into the skin every 28 (twenty-eight) days. 1.12 mL 5   influenza vaccine adjuvanted (FLUAD) 0.5 ML injection Inject into the muscle. 0.5 mL 0   methocarbamol (ROBAXIN) 500 MG tablet Take 1 tablet (500 mg total) by mouth 3 (three) times daily as needed for muscle spasms. 90 tablet 1   nystatin (MYCOSTATIN) 100000 UNIT/ML suspension Take 5 mLs (500,000 Units total) by mouth 4 (four) times daily. 60 mL 0   pantoprazole (PROTONIX) 40 MG tablet Take 1 tablet (40 mg total) by mouth daily. 30 tablet 3   Romosozumab-aqqg (EVENITY) 105 MG/1.17ML SOSY injection Inject 210 mg into the skin every 30 (thirty) days.     sertraline (ZOLOFT) 100 MG tablet Take 2 tablets (200 mg total) by mouth daily. 180 tablet 1   Travoprost, BAK Free, (TRAVATAN) 0.004 % SOLN ophthalmic solution Place 1 drop into the right eye at bedtime.     traMADol (ULTRAM) 50 MG  tablet Take 1 tablet (50 mg total) by mouth every 6 (six) hours as needed. (Patient not taking: No sig reported) 20 tablet 2   zolpidem (AMBIEN) 5 MG tablet Take 1 tablet (5 mg total) by mouth at bedtime as needed for sleep. (Patient not taking: No sig reported) 30 tablet 1   No facility-administered medications prior to visit.    Allergies  Allergen Reactions   Motrin [Ibuprofen]     GI bleed   Pollen Extract    Statins     "delibitating pain all over body"   Sulfonamide Derivatives     Pt unsure of reaction   Ciprofloxacin Swelling and Rash    Review of Systems  Constitutional:  Negative for fever.  HENT:  Negative for congestion, ear pain, hearing loss, sinus pain and sore throat.   Eyes:  Negative for blurred vision and pain.  Respiratory:  Negative for cough, sputum production, shortness of breath and wheezing.   Cardiovascular:  Negative for chest pain and palpitations.  Gastrointestinal:  Negative for blood in stool, constipation, diarrhea, nausea and vomiting.  Genitourinary:  Negative for dysuria, frequency, hematuria and urgency.       (+) vaginal discharge  (+) vaginal odor   Musculoskeletal:  Negative for back pain, falls and myalgias.  Neurological:  Negative for dizziness, sensory change, loss of consciousness, weakness and headaches.  Endo/Heme/Allergies:  Negative for environmental allergies. Does not bruise/bleed easily.  Psychiatric/Behavioral:  Negative for depression and suicidal ideas. The patient is not nervous/anxious and does not have insomnia.       Objective:    Physical Exam Constitutional:      General: She is not in acute distress.    Appearance: Normal appearance. She is not ill-appearing.  HENT:     Head: Normocephalic and atraumatic.     Right Ear: External ear normal.     Left Ear: External ear normal.  Eyes:     Extraocular Movements: Extraocular movements intact.     Pupils: Pupils are equal, round, and reactive to light.   Cardiovascular:     Rate and Rhythm: Normal rate and regular rhythm.     Pulses: Normal pulses.     Heart sounds: Normal heart sounds. No murmur heard.   No gallop.  Pulmonary:     Effort: Pulmonary effort is normal. No respiratory distress.  Breath sounds: Normal breath sounds. No wheezing, rhonchi or rales.  Abdominal:     General: Bowel sounds are normal. There is no distension.     Palpations: Abdomen is soft. There is no mass.     Tenderness: There is no abdominal tenderness. There is no guarding or rebound.     Hernia: No hernia is present.  Genitourinary:    Comments: Sparse yellow-brown discharge  Musculoskeletal:     Cervical back: Normal range of motion and neck supple.  Lymphadenopathy:     Cervical: No cervical adenopathy.  Skin:    General: Skin is warm and dry.  Neurological:     Mental Status: She is alert and oriented to person, place, and time.  Psychiatric:        Behavior: Behavior normal.    BP 134/88 (BP Location: Left Arm, Patient Position: Sitting, Cuff Size: Normal)   Pulse 76   Temp 97.6 F (36.4 C) (Oral)   Resp 18   Ht 5\' 5"  (1.651 m)   Wt 169 lb 6.4 oz (76.8 kg)   SpO2 94%   BMI 28.19 kg/m  Wt Readings from Last 3 Encounters:  03/28/21 169 lb 6.4 oz (76.8 kg)  02/25/21 168 lb (76.2 kg)  01/24/21 168 lb (76.2 kg)    Diabetic Foot Exam - Simple   No data filed    Lab Results  Component Value Date   WBC 7.9 11/16/2020   HGB 12.9 11/16/2020   HCT 38.2 11/16/2020   PLT 376.0 11/16/2020   GLUCOSE 93 11/16/2020   CHOL 259 (H) 09/15/2019   TRIG 148 09/15/2019   HDL 67 09/15/2019   LDLDIRECT 187.8 06/20/2013   LDLCALC 162 (H) 09/15/2019   ALT 14 01/24/2021   AST 18 01/24/2021   NA 136 11/16/2020   K 4.7 11/16/2020   CL 101 11/16/2020   CREATININE 0.74 11/16/2020   BUN 12 11/16/2020   CO2 26 11/16/2020   TSH 3.00 10/27/2020   INR 0.9 09/14/2019   HGBA1C 5.2 09/15/2019    Lab Results  Component Value Date   TSH 3.00  10/27/2020   Lab Results  Component Value Date   WBC 7.9 11/16/2020   HGB 12.9 11/16/2020   HCT 38.2 11/16/2020   MCV 93.5 11/16/2020   PLT 376.0 11/16/2020   Lab Results  Component Value Date   NA 136 11/16/2020   K 4.7 11/16/2020   CO2 26 11/16/2020   GLUCOSE 93 11/16/2020   BUN 12 11/16/2020   CREATININE 0.74 11/16/2020   BILITOT 0.6 01/24/2021   ALKPHOS 116 01/24/2021   AST 18 01/24/2021   ALT 14 01/24/2021   PROT 6.5 01/24/2021   ALBUMIN 4.4 01/24/2021   CALCIUM 9.9 11/16/2020   ANIONGAP 9 09/14/2019   GFR 78.60 11/16/2020   Lab Results  Component Value Date   CHOL 259 (H) 09/15/2019   Lab Results  Component Value Date   HDL 67 09/15/2019   Lab Results  Component Value Date   LDLCALC 162 (H) 09/15/2019   Lab Results  Component Value Date   TRIG 148 09/15/2019   Lab Results  Component Value Date   CHOLHDL 3.9 09/15/2019   Lab Results  Component Value Date   HGBA1C 5.2 09/15/2019       Assessment & Plan:   Problem List Items Addressed This Visit       Unprioritized   Vaginal discharge - Primary    Urine culture and genprobe pending  Suspect  BV If symptoms worsen--- rto       Relevant Orders   POCT Urinalysis Dipstick (Automated) (Completed)   Cervicovaginal ancillary only( [facility])   Other Visit Diagnoses     Abnormal urine findings       Relevant Orders   Urine Culture        No orders of the defined types were placed in this encounter.   I,Cynthia [name],acting as a Education administrator for [employer], DO.,have documented all relevant documentation on the behalf of Cynthia Held, DO,as directed by  Cynthia Held, DO while in the presence of Cynthia [name], [place], DO., personally preformed the services described in this documentation.  All medical record entries made by the scribe were at my direction and in my presence.  I have reviewed the chart and discharge instructions (if applicable)  and agree that the record reflects my personal performance and is accurate and complete. 03/28/2021

## 2021-03-29 ENCOUNTER — Ambulatory Visit: Payer: Medicare PPO | Admitting: Family Medicine

## 2021-03-29 ENCOUNTER — Encounter: Payer: Self-pay | Admitting: Family Medicine

## 2021-03-29 VITALS — BP 110/70 | Ht 65.0 in | Wt 169.0 lb

## 2021-03-29 DIAGNOSIS — M1712 Unilateral primary osteoarthritis, left knee: Secondary | ICD-10-CM

## 2021-03-29 NOTE — Progress Notes (Signed)
  Cynthia [name] - 75 y.o. female MRN [id number]  Date of birth: [birth year]  SUBJECTIVE:  Including CC & ROS.  No chief complaint on file.   Cynthia [name] is a 75 y.o. female that is following up for her left knee pain.  Has been doing well with the Zilretta injection.   Review of Systems See HPI   HISTORY: Past Medical, Surgical, Social, and Family History Reviewed & Updated per EMR.   Pertinent Historical Findings include:  Past Medical History:  Diagnosis Date   Allergy    Anxiety    Arthritis    Blood transfusion without reported diagnosis    Constipation, chronic    DEGENERATIVE JOINT DISEASE 06/02/2010   Depression    Glaucoma 2014   Right eye   Headache(784.0)    Hyperlipidemia    hx of   Osteopenia    Psoriasis    PUD (peptic ulcer disease) 2012   Upper GI bleed d/t NSAIDs   RLS (restless legs syndrome)    robaxin prn    Past Surgical History:  Procedure Laterality Date   ANKLE FRACTURE SURGERY Left    surgery 12-11 (LEFT)   CESAREAN SECTION  1969    Family History  Problem Relation Age of Onset   Aneurysm Mother        brain   Celiac disease Brother    Diabetes Maternal Grandfather    Hypertension Neg Hx    Breast cancer Neg Hx    Coronary artery disease Neg Hx    Colon cancer Neg Hx    Esophageal cancer Neg Hx    Stomach cancer Neg Hx    Rectal cancer Neg Hx     Social History   Socioeconomic History   Marital status: Widowed    Spouse name: Not on file   Number of children: 1   Years of education: Not on file   Highest education level: Doctorate  Occupational History   Occupation: retired VP of Tax inspector: RETIRED  Tobacco Use   Smoking status: Never    Passive exposure: Never   Smokeless tobacco: Never  Vaping Use   Vaping Use: Never used  Substance and Sexual Activity   Alcohol use: Yes    Comment: socially   Drug use: No   Sexual activity: Not Currently  Other Topics Concern   Not on file  Social History  Narrative   2 step children, 1 child , 7 GK   Lost husband, moved [place] 02-13-2020 ; apartment, 3th floor w/ elevator      Pt is right handed   Social Determinants of Radio broadcast assistant Strain: Not on file  Food Insecurity: Not on file  Transportation Needs: Not on file  Physical Activity: Not on file  Stress: Not on file  Social Connections: Not on file  Intimate Partner Violence: Not on file     PHYSICAL EXAM:  VS: BP 110/70 (BP Location: Left Arm, Patient Position: Sitting)   Ht 5\' 5"  (1.651 m)   Wt 169 lb (76.7 kg)   BMI 28.12 kg/m  Physical Exam Gen: NAD, alert, cooperative with exam, well-appearing     ASSESSMENT & PLAN:   OA (osteoarthritis) of knee Significant improvement in pain and function since the Zilretta injection -Counseled on home exercise therapy and supportive care. -Could consider gel injection

## 2021-03-29 NOTE — Assessment & Plan Note (Signed)
Significant improvement in pain and function since the Zilretta injection -Counseled on home exercise therapy and supportive care. -Could consider gel injection

## 2021-03-29 NOTE — Patient Instructions (Signed)
Good to see you Please continue the exercises   Please send me a message in MyChart with any questions or updates.  Please see me back when you need [place].   --Dr. Raeford Razor

## 2021-03-30 LAB — CERVICOVAGINAL ANCILLARY ONLY
Bacterial Vaginitis (gardnerella): NEGATIVE
Candida Glabrata: NEGATIVE
Candida Vaginitis: NEGATIVE
Chlamydia: NEGATIVE
Comment: NEGATIVE
Comment: NEGATIVE
Comment: NEGATIVE
Comment: NEGATIVE
Comment: NEGATIVE
Comment: NORMAL
Neisseria Gonorrhea: NEGATIVE
Trichomonas: NEGATIVE

## 2021-03-30 LAB — URINE CULTURE
MICRO NUMBER:: 12633204
SPECIMEN QUALITY:: ADEQUATE

## 2021-03-31 ENCOUNTER — Other Ambulatory Visit: Payer: Self-pay

## 2021-03-31 MED ORDER — CEPHALEXIN 500 MG PO CAPS: 500.0000 mg | ORAL_CAPSULE | Freq: Two times a day (BID) | ORAL | 0 refills | Status: DC

## 2021-05-02 ENCOUNTER — Ambulatory Visit (INDEPENDENT_AMBULATORY_CARE_PROVIDER_SITE_OTHER): Payer: Medicare PPO | Admitting: *Deleted

## 2021-05-02 DIAGNOSIS — M816 Localized osteoporosis [Lequesne]: Secondary | ICD-10-CM

## 2021-05-02 NOTE — Progress Notes (Signed)
Patient is here for nurse visit for Evenity.  Patient received bilateral sq injections. She tolerated injections well. She will return next month for her next Evenity.

## 2021-05-05 NOTE — Telephone Encounter (Signed)
VOB initiated to be run the beginning of Jan 2023.

## 2021-05-11 ENCOUNTER — Other Ambulatory Visit: Payer: Self-pay | Admitting: Internal Medicine

## 2021-05-17 ENCOUNTER — Ambulatory Visit (INDEPENDENT_AMBULATORY_CARE_PROVIDER_SITE_OTHER): Payer: Medicare PPO

## 2021-05-17 VITALS — Ht 65.0 in | Wt 166.0 lb

## 2021-05-17 DIAGNOSIS — Z0000 Encounter for general adult medical examination without abnormal findings: Secondary | ICD-10-CM | POA: Diagnosis not present

## 2021-05-17 NOTE — Patient Instructions (Signed)
Cynthia [name] , Thank you for taking time to complete your Medicare Wellness Visit. I appreciate your ongoing commitment to your health goals. Please review the following plan we discussed and let me know if I can assist you in the future.   Screening recommendations/referrals: Colonoscopy: Due-Per our conversation, you will call GI & schedule, Mammogram:  Due-Per our conversation, you will call & schedule, Bone Density: Completed 03/26/2020-Due 03/26/2022 Recommended yearly ophthalmology/optometry visit for glaucoma screening and checkup Recommended yearly dental visit for hygiene and checkup  Vaccinations: Influenza vaccine: up to date Pneumococcal vaccine: Up to date Tdap vaccine: Up to date Shingles vaccine: Completed vaccines   Covid-19:Up to date  Advanced directives: Please bring a copy of Living Will and/or Healthcare Power of Attorney for your chart.   Conditions/risks identified: See problem list  Next appointment: Follow up in one year for your annual wellness visit    Preventive Care 65 Years and Older, Female Preventive care refers to lifestyle choices and visits with your health care provider that can promote health and wellness. What does preventive care include? A yearly physical exam. This is also called an annual well check. Dental exams once or twice a year. Routine eye exams. Ask your health care provider how often you should have your eyes checked. Personal lifestyle choices, including: Daily care of your teeth and gums. Regular physical activity. Eating a healthy diet. Avoiding tobacco and drug use. Limiting alcohol use. Practicing safe sex. Taking low-dose aspirin every day. Taking vitamin and mineral supplements as recommended by your health care provider. What happens during an annual well check? The services and screenings done by your health care provider during your annual well check will depend on your age, overall health, lifestyle risk factors, and  family history of disease. Counseling  Your health care provider may ask you questions about your: Alcohol use. Tobacco use. Drug use. Emotional well-being. Home and relationship well-being. Sexual activity. Eating habits. History of falls. Memory and ability to understand (cognition). Work and work Statistician. Reproductive health. Screening  You may have the following tests or measurements: Height, weight, and BMI. Blood pressure. Lipid and cholesterol levels. These may be checked every 5 years, or more frequently if you are over 18 years old. Skin check. Lung cancer screening. You may have this screening every year starting at age 23 if you have a 30-pack-year history of smoking and currently smoke or have quit within the past 15 years. Fecal occult blood test (FOBT) of the stool. You may have this test every year starting at age 81. Flexible sigmoidoscopy or colonoscopy. You may have a sigmoidoscopy every 5 years or a colonoscopy every 10 years starting at age 45. Hepatitis C blood test. Hepatitis B blood test. Sexually transmitted disease (STD) testing. Diabetes screening. This is done by checking your blood sugar (glucose) after you have not eaten for a while (fasting). You may have this done every 1-3 years. Bone density scan. This is done to screen for osteoporosis. You may have this done starting at age 67. Mammogram. This may be done every 1-2 years. Talk to your health care provider about how often you should have regular mammograms. Talk with your health care provider about your test results, treatment options, and if necessary, the need for more tests. Vaccines  Your health care provider may recommend certain vaccines, such as: Influenza vaccine. This is recommended every year. Tetanus, diphtheria, and acellular pertussis (Tdap, Td) vaccine. You may need a Td booster every 10 years. Zoster  vaccine. You may need this after age 63. Pneumococcal 13-valent conjugate  (PCV13) vaccine. One dose is recommended after age 66. Pneumococcal polysaccharide (PPSV23) vaccine. One dose is recommended after age 70. Talk to your health care provider about which screenings and vaccines you need and how often you need them. This information is not intended to replace advice given to you by your health care provider. Make sure you discuss any questions you have with your health care provider. Document Released: 05/28/2015 Document Revised: 01/19/2016 Document Reviewed: 03/02/2015 Elsevier Interactive Patient Education  2017 [place] Prevention in the Home Falls can cause injuries. They can happen to people of all ages. There are many things you can do to make your home safe and to help prevent falls. What can I do on the outside of my home? Regularly fix the edges of walkways and driveways and fix any cracks. Remove anything that might make you trip as you walk through a door, such as a raised step or threshold. Trim any bushes or trees on the path to your home. Use bright outdoor lighting. Clear any walking paths of anything that might make someone trip, such as rocks or tools. Regularly check to see if handrails are loose or broken. Make sure that both sides of any steps have handrails. Any raised decks and porches should have guardrails on the edges. Have any leaves, snow, or ice cleared regularly. Use sand or salt on walking paths during winter. Clean up any spills in your garage right away. This includes oil or grease spills. What can I do in the bathroom? Use night lights. Install grab bars by the toilet and in the tub and shower. Do not use towel bars as grab bars. Use non-skid mats or decals in the tub or shower. If you need to sit down in the shower, use a plastic, non-slip stool. Keep the floor dry. Clean up any water that spills on the floor as soon as it happens. Remove soap buildup in the tub or shower regularly. Attach bath mats securely with  double-sided non-slip rug tape. Do not have throw rugs and other things on the floor that can make you trip. What can I do in the bedroom? Use night lights. Make sure that you have a light by your bed that is easy to reach. Do not use any sheets or blankets that are too big for your bed. They should not hang down onto the floor. Have a firm chair that has side arms. You can use this for support while you get dressed. Do not have throw rugs and other things on the floor that can make you trip. What can I do in the kitchen? Clean up any spills right away. Avoid walking on wet floors. Keep items that you use a lot in easy-to-reach places. If you need to reach something above you, use a strong step stool that has a grab bar. Keep electrical cords out of the way. Do not use floor polish or wax that makes floors slippery. If you must use wax, use non-skid floor wax. Do not have throw rugs and other things on the floor that can make you trip. What can I do with my stairs? Do not leave any items on the stairs. Make sure that there are handrails on both sides of the stairs and use them. Fix handrails that are broken or loose. Make sure that handrails are as long as the stairways. Check any carpeting to make sure that it  is firmly attached to the stairs. Fix any carpet that is loose or worn. Avoid having throw rugs at the top or bottom of the stairs. If you do have throw rugs, attach them to the floor with carpet tape. Make sure that you have a light switch at the top of the stairs and the bottom of the stairs. If you do not have them, ask someone to add them for you. What else can I do to help prevent falls? Wear shoes that: Do not have high heels. Have rubber bottoms. Are comfortable and fit you well. Are closed at the toe. Do not wear sandals. If you use a stepladder: Make sure that it is fully opened. Do not climb a closed stepladder. Make sure that both sides of the stepladder are locked  into place. Ask someone to hold it for you, if possible. Clearly mark and make sure that you can see: Any grab bars or handrails. First and last steps. Where the edge of each step is. Use tools that help you move around (mobility aids) if they are needed. These include: Canes. Walkers. Scooters. Crutches. Turn on the lights when you go into a dark area. Replace any light bulbs as soon as they burn out. Set up your furniture so you have a clear path. Avoid moving your furniture around. If any of your floors are uneven, fix them. If there are any pets around you, be aware of where they are. Review your medicines with your doctor. Some medicines can make you feel dizzy. This can increase your chance of falling. Ask your doctor what other things that you can do to help prevent falls. This information is not intended to replace advice given to you by your health care provider. Make sure you discuss any questions you have with your health care provider. Document Released: 02/25/2009 Document Revised: 10/07/2015 Document Reviewed: 06/05/2014 Elsevier Interactive Patient Education  2017 [employer].

## 2021-05-17 NOTE — Progress Notes (Addendum)
Subjective:   Cynthia [name] is a 76 y.o. female who presents for Medicare Annual (Subsequent) preventive examination.  I connected with  [facility] today by a video enabled telemedicine application and verified that I am speaking with the correct person using two identifiers.  Location of patient:home Location of provider:Work  Persons participating in the virtual visit: patient, nurse.   I discussed the limitations, risk, security and privacy concerns of evaluation and management by telemedicine. The patient expressed understanding and agreed to proceed.  Some vital signs may be absent or patient reported.   Review of Systems     Cardiac Risk Factors include: advanced age (>50men, >17 women);dyslipidemia     Objective:    Today's Vitals   05/17/21 1333 05/17/21 1334  Weight: 166 lb (75.3 kg)   Height: 5\' 5"  (1.651 m)   PainSc:  2    Body mass index is 27.62 kg/m.  Advanced Directives 05/17/2021 01/12/2020 09/23/2019 09/14/2019 04/17/2019 01/08/2019 01/09/2018  Does Patient Have a Medical Advance Directive? Yes Yes No Yes Yes No Yes  Type of Paramedic of [place];Living will [place];Living will - - Living will - Living will  Does patient want to make changes to medical advance directive? - - - - - - -  Copy of [place] in Chart? No - copy requested - - - - - -  Would patient like information on creating a medical advance directive? - - - - No - Patient declined No - Patient declined -    Current Medications (verified) Outpatient Encounter Medications as of 05/17/2021  Medication Sig   buPROPion (WELLBUTRIN XL) 300 MG 24 hr tablet TAKE ONE (1) TABLET BY MOUTH EVERY DAY   cetirizine (ZYRTEC) 10 MG tablet Take 10 mg by mouth at bedtime as needed for allergies.   ezetimibe (ZETIA) 10 MG tablet TAKE ONE (1) TABLET BY MOUTH EVERY DAY   Galcanezumab-gnlm (EMGALITY) 120 MG/ML SOAJ Inject 120 mg into the skin every 28  (twenty-eight) days.   methocarbamol (ROBAXIN) 500 MG tablet Take 1 tablet (500 mg total) by mouth 3 (three) times daily as needed for muscle spasms.   nystatin (MYCOSTATIN) 100000 UNIT/ML suspension Take 5 mLs (500,000 Units total) by mouth 4 (four) times daily.   pantoprazole (PROTONIX) 40 MG tablet Take 1 tablet (40 mg total) by mouth daily.   Romosozumab-aqqg (EVENITY) 105 MG/1.17ML SOSY injection Inject 210 mg into the skin every 30 (thirty) days.   sertraline (ZOLOFT) 100 MG tablet Take 2 tablets (200 mg total) by mouth daily.   Travoprost, BAK Free, (TRAVATAN) 0.004 % SOLN ophthalmic solution Place 1 drop into the right eye at bedtime.   influenza vaccine adjuvanted (FLUAD) 0.5 ML injection Inject into the muscle. (Patient not taking: Reported on 05/17/2021)   [DISCONTINUED] cephALEXin (KEFLEX) 500 MG capsule Take 1 capsule (500 mg total) by mouth 2 (two) times daily.   No facility-administered encounter medications on file as of 05/17/2021.    Allergies (verified) Bee pollen, Motrin [ibuprofen], Pollen extract, Statins, Sulfonamide derivatives, and Ciprofloxacin   History: Past Medical History:  Diagnosis Date   Allergy    Anxiety    Arthritis    Blood transfusion without reported diagnosis    Constipation, chronic    DEGENERATIVE JOINT DISEASE 06/02/2010   Depression    Glaucoma 2014   Right eye   Headache(784.0)    Hyperlipidemia    hx of   Osteopenia    Psoriasis  PUD (peptic ulcer disease) 2012   Upper GI bleed d/t NSAIDs   RLS (restless legs syndrome)    robaxin prn   Past Surgical History:  Procedure Laterality Date   ANKLE FRACTURE SURGERY Left    surgery 12-11 (LEFT)   CESAREAN SECTION  1969   Family History  Problem Relation Age of Onset   Aneurysm Mother        brain   Celiac disease Brother    Diabetes Maternal Grandfather    Hypertension Neg Hx    Breast cancer Neg Hx    Coronary artery disease Neg Hx    Colon cancer Neg Hx    Esophageal cancer  Neg Hx    Stomach cancer Neg Hx    Rectal cancer Neg Hx    Social History   Socioeconomic History   Marital status: Widowed    Spouse name: Not on file   Number of children: 1   Years of education: Not on file   Highest education level: Doctorate  Occupational History   Occupation: retired VP of Tax inspector: RETIRED  Tobacco Use   Smoking status: Never    Passive exposure: Never   Smokeless tobacco: Never  Vaping Use   Vaping Use: Never used  Substance and Sexual Activity   Alcohol use: Yes    Comment: socially   Drug use: No   Sexual activity: Not Currently  Other Topics Concern   Not on file  Social History Narrative   2 step children, 1 child , 7 GK   Lost husband, [place] 02-13-2020 ; apartment, 3th floor w/ elevator      Pt is right handed   Social Determinants of Radio broadcast assistant Strain: Low Risk    Difficulty of Paying Living Expenses: Not hard at all  Food Insecurity: No Food Insecurity   Worried About Charity fundraiser in the Last Year: Never true   Arboriculturist in the Last Year: Never true  Transportation Needs: No Transportation Needs   Lack of Transportation (Medical): No   Lack of Transportation (Non-Medical): No  Physical Activity: Sufficiently Active   Days of Exercise per Week: 4 days   Minutes of Exercise per Session: 40 min  Stress: No Stress Concern Present   Feeling of Stress : Only a little  Social Connections: Moderately Integrated   Frequency of Communication with Friends and Family: More than three times a week   Frequency of Social Gatherings with Friends and Family: More than three times a week   Attends Religious Services: More than 4 times per year   Active Member of [employer] or Organizations: Yes   Attends Archivist Meetings: More than 4 times per year   Marital Status: Widowed    Tobacco Counseling Counseling given: Not Answered   Clinical Intake:  Pre-visit preparation  completed: Yes  Pain : 0-10 Pain Score: 2  Pain Type: Acute pain Pain Location: Head (sinus headache) Pain Onset: In the past 7 days Pain Frequency: Intermittent     BMI - recorded: 27.62 Nutritional Status: BMI 25 -29 Overweight Nutritional Risks: None Diabetes: No  How often do you need to have someone help you when you read instructions, pamphlets, or other written materials from your doctor or pharmacy?: 1 - Never  Diabetic?No  Interpreter Needed?: No  Information entered by :: Caroleen Hamman LPN   Activities of Daily Living In your present state of health, do  you have any difficulty performing the following activities: 05/17/2021 05/17/2021  Hearing? N N  Vision? N N  Difficulty concentrating or making decisions? Y N  Comment occasionally forgets a name -  Walking or climbing stairs? Y Y  Comment climbing stairs -  Dressing or bathing? N N  Doing errands, shopping? N N  Preparing Food and eating ? N N  Using the Toilet? N N  In the past six months, have you accidently leaked urine? Y Y  Comment occasionally -  Do you have problems with loss of bowel control? N N  Managing your Medications? N N  Managing your Finances? N N  Housekeeping or managing your Housekeeping? N N  Some recent data might be hidden    Patient Care Team: Colon Branch, MD as PCP - General Iona Beard, OD as Referring Physician (Optometry) Pieter Partridge, DO as Consulting Physician (Neurology) Deirdre Pippins, PA-C as Physician Assistant (Dermatology) Page Spiro, [place] as Consulting Physician (Chiropractic Medicine)  Indicate any recent Medical Services you may have received from other than [facility] providers in the past year (date may be approximate).     Assessment:   This is a routine wellness examination for [place].  Hearing/Vision screen Hearing Screening - Comments:: High frequency hearing loss Vision Screening - Comments:: Last eye exam-4 months ago  Dietary issues and exercise  activities discussed: Current Exercise Habits: Home exercise routine, Type of exercise: Other - see comments;strength training/weights (exercise bike), Time (Minutes): 40, Frequency (Times/Week): 4, Weekly Exercise (Minutes/Week): 160, Intensity: Mild, Exercise limited by: None identified   Goals Addressed             This Visit's Progress    Increase physical activity   On track    Increase water intake   Not on track      Depression Screen [facility] 2/9 Scores 05/17/2021 01/24/2021 11/16/2020 10/27/2020 11/26/2019 09/23/2019 09/17/2019  PHQ - 2 Score 1 0 0 2 2 2 2   PHQ- 9 Score - - - 9 7 4 7     Fall Risk Fall Risk  05/17/2021 05/17/2021 01/24/2021 11/16/2020 10/27/2020  Falls in the past year? 1 1 0 0 1  Number falls in past yr: 1 1 0 0 1  Injury with Fall? 0 0 0 0 1  Risk Factor Category  - - - - -  Risk for fall due to : History of fall(s) - - - -  Follow up Falls prevention discussed - Falls evaluation completed Falls evaluation completed Falls evaluation completed    [place]:  Any stairs in or around the home? Yes  If so, are there any without handrails? No  Home free of loose throw rugs in walkways, pet beds, electrical cords, etc? Yes  Adequate lighting in your home to reduce risk of falls? Yes   ASSISTIVE DEVICES UTILIZED TO PREVENT FALLS:  Life alert? Yes  Use of a cane, walker or w/c? Yes sometimes Grab bars in the bathroom? Yes  Shower chair or bench in shower? Yes  Elevated toilet seat or a handicapped toilet? No   TIMED UP AND GO:  Was the test performed? No . Phone visit   Cognitive Function:Normal cognitive status assessed by this Nurse Health Advisor. No abnormalities found.   MMSE - Mini Mental State Exam 07/04/2016  Orientation to time 5  Orientation to Place 5  Registration 3  Attention/ Calculation 5  Recall 2  Language- name 2 objects 2  Language- repeat 1  Language- follow 3 step command 3  Language- read & follow  direction 1  Write a sentence 1  Copy design 1  Total score 29        Immunizations Immunization History  Administered Date(s) Administered   Fluad Quad(high Dose 65+) 01/30/2019, 02/17/2021   Influenza Split 03/13/2011, 01/31/2012   Influenza Whole 03/05/2007, 02/26/2008, 01/28/2010   Influenza, High Dose Seasonal PF 03/06/2013, 02/15/2015, 02/14/2016, 03/19/2018, 02/10/2020, 02/21/2021   Influenza-Unspecified 02/14/2017   PFIZER(Purple Top)SARS-COV-2 Vaccination 06/06/2019, 06/27/2019, 03/09/2020, 09/01/2020   Pfizer Covid-19 Vaccine Bivalent Booster 8yrs & up 01/26/2021   Pneumococcal Conjugate-13 07/03/2014   Pneumococcal Polysaccharide-23 11/09/2009, 04/26/2020   Td 05/16/1999, 11/09/2009   Tdap 02/27/2013   Zoster Recombinat (Shingrix) 01/29/2018, 04/16/2018   Zoster, Live 12/06/2007    TDAP status: Up to date  Flu Vaccine status: Up to date  Pneumococcal vaccine status: Up to date  Covid-19 vaccine status: Completed vaccines  Qualifies for Shingles Vaccine? No   Zostavax completed Yes   Shingrix Completed?: Yes  Screening Tests Health Maintenance  Topic Date Due   COLONOSCOPY (Pts 45-96yrs Insurance coverage will need to be confirmed)  12/17/2020   MAMMOGRAM  02/16/2021   TETANUS/TDAP  02/28/2023   Pneumonia Vaccine 30+ Years old  Completed   INFLUENZA VACCINE  Completed   DEXA SCAN  Completed   COVID-19 Vaccine  Completed   Hepatitis C Screening  Completed   Zoster Vaccines- Shingrix  Completed   HPV VACCINES  Aged Out    Health Maintenance  Health Maintenance Due  Topic Date Due   COLONOSCOPY (Pts 45-51yrs Insurance coverage will need to be confirmed)  12/17/2020   MAMMOGRAM  02/16/2021    Colorectal cancer screening: Due-Patient states she will call GI to schedule.  Mammogram status: Due-Patient states she will call to schedule.  Bone Density status: Completed 03/26/2020. Results reflect: Bone density results: OSTEOPOROSIS. Repeat every 2  years.  Lung Cancer Screening: (Low Dose CT Chest recommended if Age 65-80 years, 30 pack-year currently smoking OR have quit w/in 15years.) does not qualify.     Additional Screening:  Hepatitis C Screening:  Completed 11/22/2016  Vision Screening: Recommended annual ophthalmology exams for early detection of glaucoma and other disorders of the eye. Is the patient up to date with their annual eye exam?  Yes  Who is the provider or what is the name of the office in which the patient attends annual eye exams? Pt unsure of name  Dental Screening: Recommended annual dental exams for proper oral hygiene  Community Resource Referral / Chronic Care Management: CRR required this visit?  No   CCM required this visit?  No      Plan:     I have personally reviewed and noted the following in the patients chart:   Medical and social history Use of alcohol, tobacco or illicit drugs  Current medications and supplements including opioid prescriptions.  Functional ability and status Nutritional status Physical activity Advanced directives List of other physicians Hospitalizations, surgeries, and ER visits in previous 12 months Vitals Screenings to include cognitive, depression, and falls Referrals and appointments  In addition, I have reviewed and discussed with patient certain preventive protocols, quality metrics, and best practice recommendations. A written personalized care plan for preventive services as well as general preventive health recommendations were provided to patient.   Due to this being a virtual visit, the after visit summary with patients personalized plan was offered to patient via mail or  my-chart. Patient would like to access on my-chart.   Marta Antu, LPN   [id number]/9927  Nurse Health Advisor  Nurse Notes: None   I have reviewed and agree with Health Coaches documentation.  Kathlene November, MD

## 2021-05-21 IMAGING — DX DG HIP (WITH OR WITHOUT PELVIS) 2-3V RIGHT
3 series · 3 of 3 positions shown · non-contrast
Comparison: None.

CLINICAL DATA: Right hip pain for 2 weeks with no injury.

EXAM:
DG HIP (WITH OR WITHOUT PELVIS) 2-3V RIGHT

[pelvis ap]
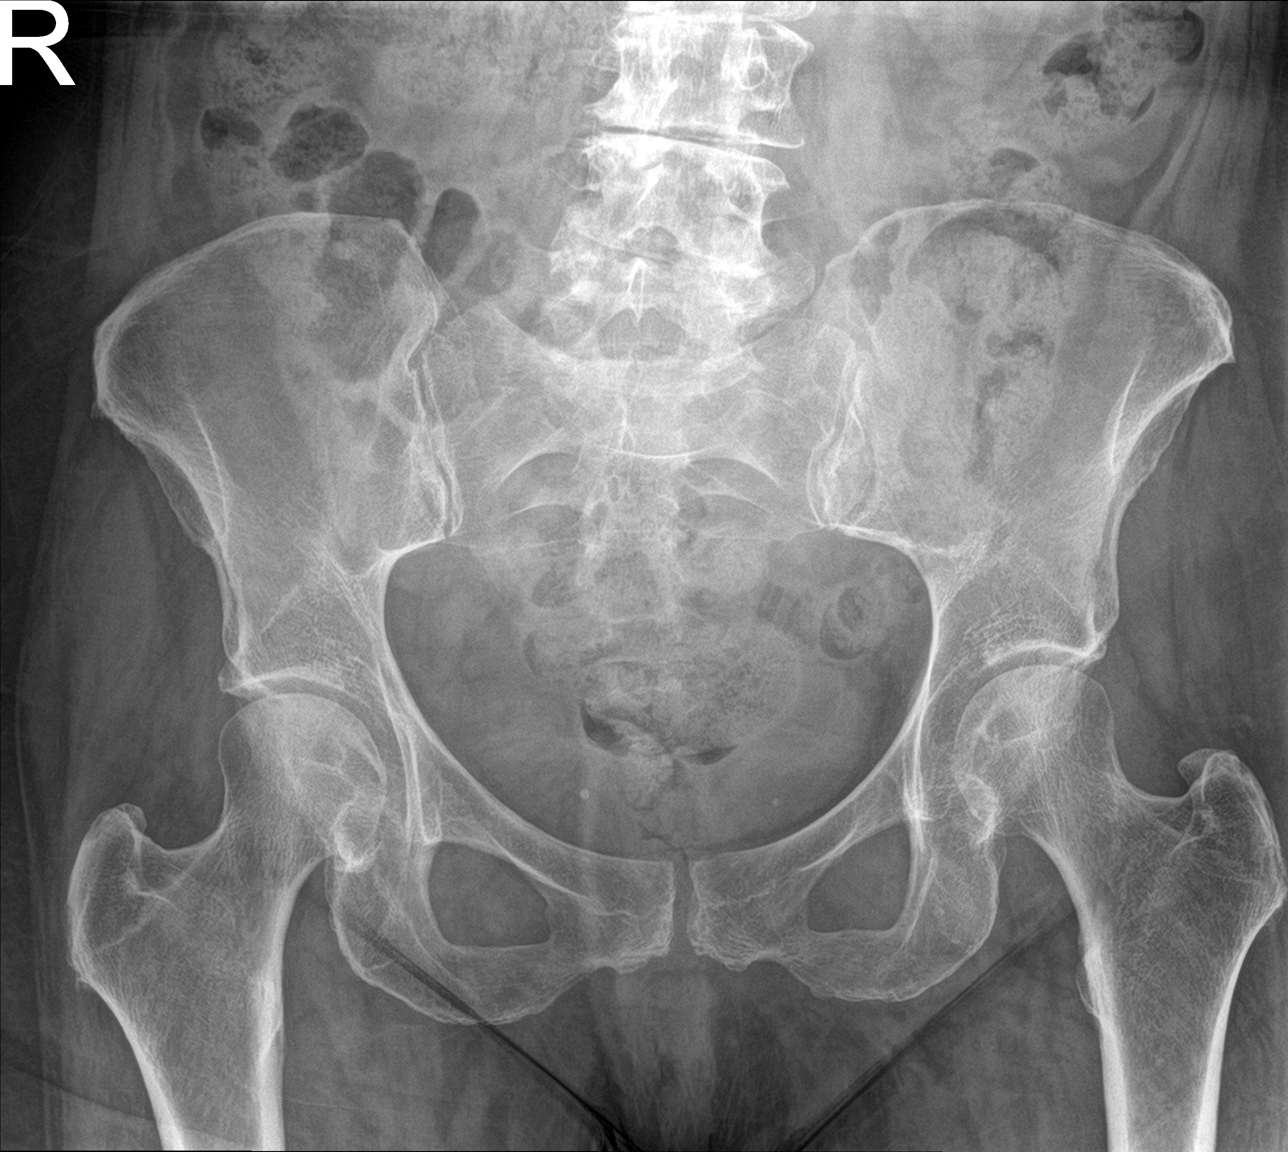

[hip ap]
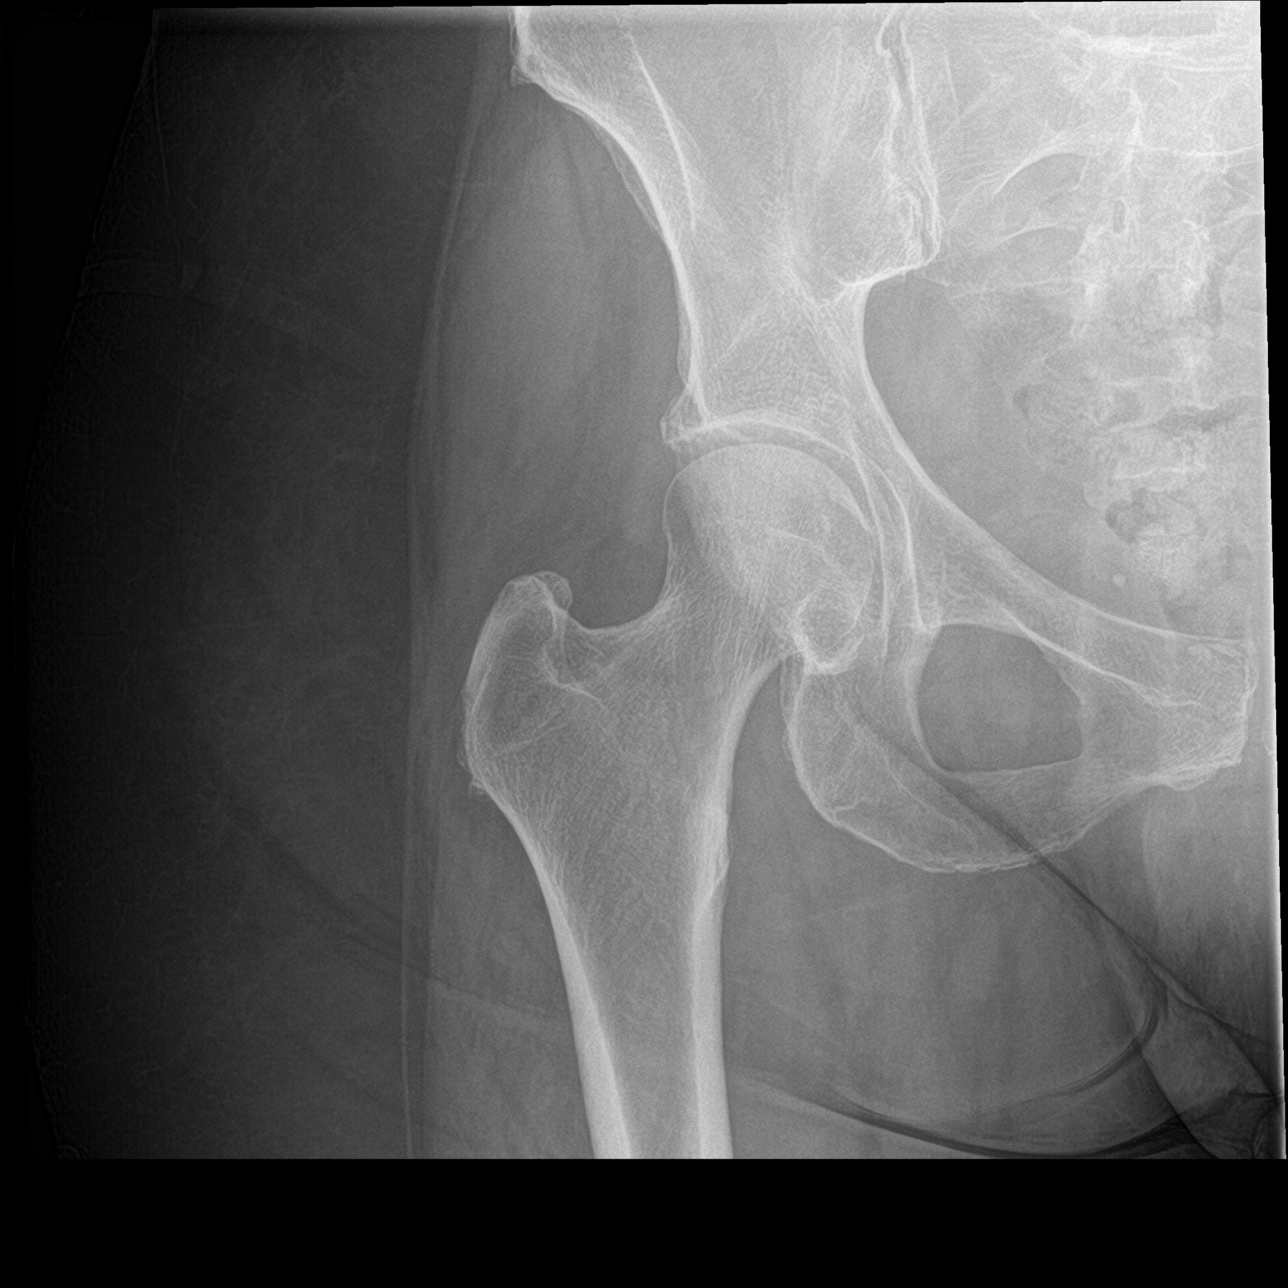

[hip lat]
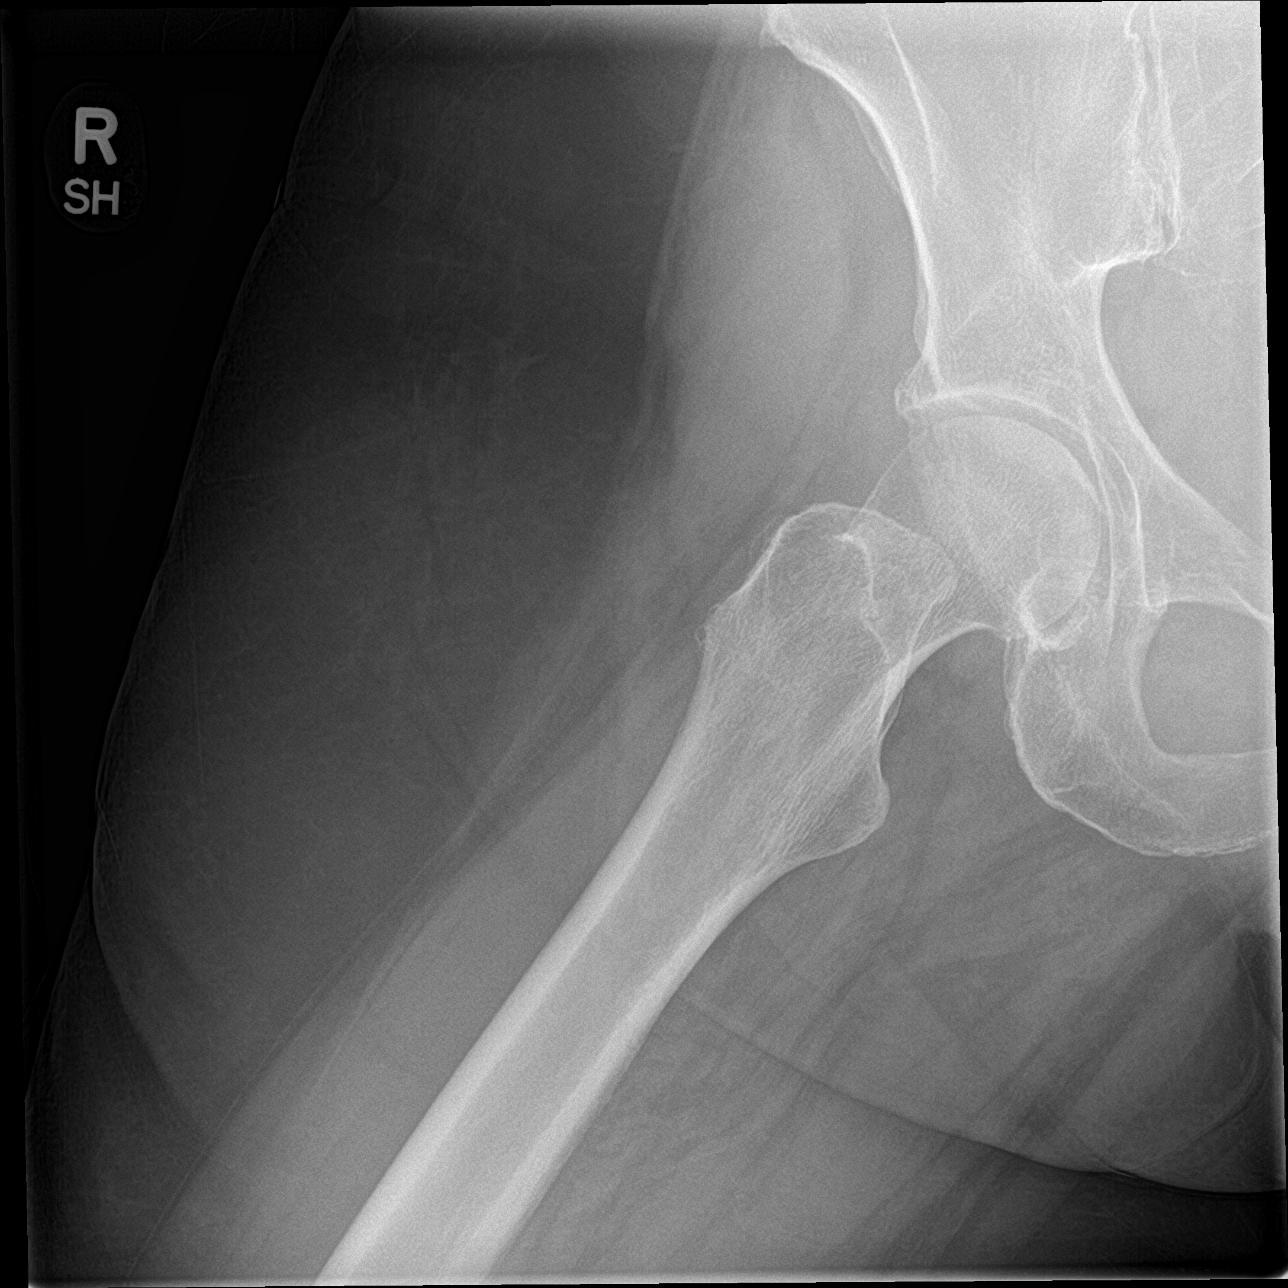

[3 of 3 positions shown; findings below may reference images not displayed]

FINDINGS: There is no evidence of hip fracture or dislocation. Mild
buttressing of the femoral heads. Minimal osteoarthritic changes at
the hip joints.
IMPRESSION: 1. No acute fracture or dislocation identified about the right hip.
2. Minimal osteoarthritic changes at the hip joints.

## 2021-05-24 NOTE — Telephone Encounter (Addendum)
Prior Auth: APPROVED PA# [id number] Valid: 10/01/20-10/01/21

## 2021-05-26 ENCOUNTER — Ambulatory Visit (INDEPENDENT_AMBULATORY_CARE_PROVIDER_SITE_OTHER): Payer: Medicare PPO | Admitting: Internal Medicine

## 2021-05-26 ENCOUNTER — Encounter (HOSPITAL_BASED_OUTPATIENT_CLINIC_OR_DEPARTMENT_OTHER): Payer: Self-pay

## 2021-05-26 ENCOUNTER — Encounter: Payer: Self-pay | Admitting: Internal Medicine

## 2021-05-26 ENCOUNTER — Ambulatory Visit (HOSPITAL_BASED_OUTPATIENT_CLINIC_OR_DEPARTMENT_OTHER)
Admission: RE | Admit: 2021-05-26 | Discharge: 2021-05-26 | Disposition: A | Discharge status: Home / Self Care | Payer: Medicare PPO | Source: Ambulatory Visit | Attending: Internal Medicine | Admitting: Internal Medicine

## 2021-05-26 ENCOUNTER — Other Ambulatory Visit: Payer: Self-pay

## 2021-05-26 VITALS — BP 122/80 | HR 76 | Temp 98.4°F | Resp 16 | Ht 65.0 in | Wt 170.2 lb

## 2021-05-26 DIAGNOSIS — F32A Depression, unspecified: Secondary | ICD-10-CM | POA: Diagnosis not present

## 2021-05-26 DIAGNOSIS — Z1231 Encounter for screening mammogram for malignant neoplasm of breast: Secondary | ICD-10-CM

## 2021-05-26 DIAGNOSIS — R269 Unspecified abnormalities of gait and mobility: Secondary | ICD-10-CM

## 2021-05-26 DIAGNOSIS — N898 Other specified noninflammatory disorders of vagina: Secondary | ICD-10-CM | POA: Diagnosis not present

## 2021-05-26 DIAGNOSIS — F419 Anxiety disorder, unspecified: Secondary | ICD-10-CM

## 2021-05-26 DIAGNOSIS — Z0000 Encounter for general adult medical examination without abnormal findings: Secondary | ICD-10-CM | POA: Diagnosis not present

## 2021-05-26 DIAGNOSIS — Z0001 Encounter for general adult medical examination with abnormal findings: Secondary | ICD-10-CM | POA: Diagnosis not present

## 2021-05-26 DIAGNOSIS — E785 Hyperlipidemia, unspecified: Secondary | ICD-10-CM | POA: Diagnosis not present

## 2021-05-26 DIAGNOSIS — Z1211 Encounter for screening for malignant neoplasm of colon: Secondary | ICD-10-CM

## 2021-05-26 LAB — TSH: TSH: 3.14 u[IU]/mL (ref 0.35–5.50)

## 2021-05-26 LAB — BASIC METABOLIC PANEL
BUN: 15 mg/dL (ref 6–23)
CO2: 26 mEq/L (ref 19–32)
Calcium: 9.3 mg/dL (ref 8.4–10.5)
Chloride: 102 mEq/L (ref 96–112)
Creatinine, Ser: 0.84 mg/dL (ref 0.40–1.20)
GFR: 67.26 mL/min (ref >60.00)
Glucose, Bld: 84 mg/dL (ref 70–99)
Potassium: 4.6 mEq/L (ref 3.5–5.1)
Sodium: 137 mEq/L (ref 135–145)

## 2021-05-26 LAB — LIPID PANEL
Cholesterol: 260 mg/dL — ABNORMAL HIGH (ref 0–200)
HDL: 81.3 mg/dL (ref >39.00)
LDL Cholesterol: 159 mg/dL — ABNORMAL HIGH (ref 0–99)
NonHDL: 178.68
Total CHOL/HDL Ratio: 3
Triglycerides: 100 mg/dL (ref 0.0–149.0)
VLDL: 20 mg/dL (ref 0.0–40.0)

## 2021-05-26 NOTE — Patient Instructions (Addendum)
We have placed an order for your yearly mammogram. Please stop downstairs ([address]) to schedule that at your convenience.    Patient read information about fall prevention day advance care planning  GO TO THE LAB : Get the blood work     [place], [place] back for a checkup in 4 months    Fall Prevention in the Home, Adult Falls can cause injuries and affect people of all ages. There are many simple things that you can do to make your home safe and to help prevent falls. Ask for help when making these changes, if needed. What actions can I take to prevent falls? General instructions Use good lighting in all rooms. Replace any light bulbs that burn out, turn on lights if it is dark, and use night-lights. Place frequently used items in easy-to-reach places. Lower the shelves around your home if necessary. Set up furniture so that there are clear paths around it. Avoid moving your furniture around. Remove throw rugs and other tripping hazards from the floor. Avoid walking on wet floors. Fix any uneven floor surfaces. Add color or contrast paint or tape to grab bars and handrails in your home. Place contrasting color strips on the first and last steps of staircases. When you use a stepladder, make sure that it is completely opened and that the sides and supports are firmly locked. Have someone hold the ladder while you are using it. Do not climb a closed stepladder. Know where your pets are when moving through your home. What can I do in the bathroom?   Keep the floor dry. Immediately clean up any water that is on the floor. Remove soap buildup in the tub or shower regularly. Use nonskid mats or decals on the floor of the tub or shower. Attach bath mats securely with double-sided, nonslip rug tape. If you need to sit down while you are in the shower, use a plastic, nonslip stool. Install grab bars by the toilet and in the tub and shower. Do not  use towel bars as grab bars. What can I do in the bedroom? Make sure that a bedside light is easy to reach. Do not use oversized bedding that reaches the floor. Have a firm chair that has side arms to use for getting dressed. What can I do in the kitchen? Clean up any spills right away. If you need to reach for something above you, use a sturdy step stool that has a grab bar. Keep electrical cables out of the way. Do not use floor polish or wax that makes floors slippery. If you must use wax, make sure that it is non-skid floor wax. What can I do with my stairs? Do not leave any items on the stairs. Make sure that you have a light switch at the top and the bottom of the stairs. Have them installed if you do not have them. Make sure that there are handrails on both sides of the stairs. Fix handrails that are broken or loose. Make sure that handrails are as long as the staircases. Install non-slip stair treads on all stairs in your home. Avoid having throw rugs at the top or bottom of stairs, or secure the rugs with carpet tape to prevent them from moving. Choose a carpet design that does not hide the edge of steps on the stairs. Check any carpeting to make sure that it is firmly attached to the stairs. Fix any carpet that is  loose or worn. What can I do on the outside of my home? Use bright outdoor lighting. Regularly repair the edges of walkways and driveways and fix any cracks. Remove high doorway thresholds. Trim any shrubbery on the main path into your home. Regularly check that handrails are securely fastened and in good repair. Both sides of all steps should have handrails. Install guardrails along the edges of any raised decks or porches. Clear walkways of debris and clutter, including tools and rocks. Have leaves, snow, and ice cleared regularly. Use sand or salt on walkways during winter months. In the garage, clean up any spills right away, including grease or oil spills. What  other actions can I take? Wear closed-toe shoes that fit well and support your feet. Wear shoes that have rubber soles or low heels. Use mobility aids as needed, such as canes, walkers, scooters, and crutches. Review your medicines with your health care provider. Some medicines can cause dizziness or changes in blood pressure, which increase your risk of falling. Talk with your health care provider about other ways that you can decrease your risk of falls. This may include working with a physical therapist or trainer to improve your strength, balance, and endurance. Where to find more information Centers for Disease Control and Prevention, [employer]: http://www.wolf.info/ National Institute on Aging: http://kim-miller.com/ Contact a health care provider if: You are afraid of falling at home. You feel weak, drowsy, or dizzy at home. You fall at home. Summary There are many simple things that you can do to make your home safe and to help prevent falls. Ways to make your home safe include removing tripping hazards and installing grab bars in the bathroom. Ask for help when making these changes in your home. This information is not intended to replace advice given to you by your health care provider. Make sure you discuss any questions you have with your health care provider. Document Revised: 12/03/2019 Document Reviewed: 12/03/2019 Elsevier Patient Education  [place].

## 2021-05-26 NOTE — Assessment & Plan Note (Signed)
Here for CPX Dyslipidemia: On Zetia, declines a statins, checking labs. Anxiety, depression, insomnia: Well-controlled with current meds. DJD: Has seen sports medicine's for knee pain. Osteoporosis: On Evenity prescribed elsewhere Chronic migraines: Improved with Emgality. Increase alkaline phosphatase: Levels are normalized 01/2021 GERD, dysphagia: Symptoms at baseline, tolerating pills better following some dietary changes.  Reluctant to take PPIs regularly.  Advised that if symptoms continue or increase needs to see GI. Vaginal discharge: Saw my partner Dr. Etter Sjogren, had a pelvic exam, was found to have a UTI and treated with antibiotics.  Vaginal swab negative otherwise.  Still has some symptoms, declines gynecology referral. Gait disorder: Had 2 mechanical falls last year, has episodic pain on the right mastoid since.  Exam is benign.  Fall prevention discussed, offered PT referral- declined. RTC 6 months

## 2021-05-26 NOTE — Assessment & Plan Note (Signed)
-  Tdap 2014 - PNM 23 2011, 04/26/2020 -prevnar: 2016 - completed zostavax and shingrix - covid vax : UTD -Had a flu shot -CCS: Colonoscopy: 05/15/2002, cscope 8-15, 1 polyp.  Due for colonoscopy, likes to proceed>> GI referral --No further  PAPs, had a pelvic last year.  See comments under vaginal discharge.  - last MMG 02/2020 ( [facility]), order placed  -Diet-exercise discussed -Labs: BMP, FLP, TSH -ACP information provided

## 2021-05-26 NOTE — Progress Notes (Signed)
Subjective:    Patient ID: Cynthia [name], female    DOB: 1945/06/02, 76 y.o.   MRN: [id number]  DOS:  05/26/2021 Type of visit - description: CPX  Here for CPX. Still has occasional yellow vaginal discharge described as a "stain" on the underwear, she suspect from the vagina, saw Dr. Etter Sjogren for that. Still has occasionally difficulty swallowing pills but is better.  No weight loss. She had 2 mechanical falls last year, last one was in October, she bumped her head, no LOC.  Since then has episodic and infrequent pain at the right mastoid area.  No pain with moving her head or neck, no nausea or vomiting. Allergies with occasional cough but no fever or chills.  Wt Readings from Last 3 Encounters:  05/26/21 170 lb 4 oz (77.2 kg)  05/17/21 166 lb (75.3 kg)  03/29/21 169 lb (76.7 kg)     Review of Systems    Other than above, a 14 point review of systems is negative      Past Medical History:  Diagnosis Date   Allergy    Anxiety    Arthritis    Blood transfusion without reported diagnosis    Constipation, chronic    DEGENERATIVE JOINT DISEASE 06/02/2010   Depression    Glaucoma 2014   Right eye   Headache(784.0)    Hyperlipidemia    hx of   Osteopenia    Psoriasis    PUD (peptic ulcer disease) 2012   Upper GI bleed d/t NSAIDs   RLS (restless legs syndrome)    robaxin prn    Past Surgical History:  Procedure Laterality Date   ANKLE FRACTURE SURGERY Left    surgery 12-11 (LEFT)   CESAREAN SECTION  1969   Social History   Socioeconomic History   Marital status: Widowed    Spouse name: Not on file   Number of children: 1   Years of education: Not on file   Highest education level: Doctorate  Occupational History   Occupation: retired VP of Tax inspector: RETIRED  Tobacco Use   Smoking status: Never    Passive exposure: Never   Smokeless tobacco: Never  Vaping Use   Vaping Use: Never used  Substance and Sexual Activity   Alcohol use: Yes     Comment: socially   Drug use: No   Sexual activity: Not Currently  Other Topics Concern   Not on file  Social History Narrative   2 step children, 1 child , 7 GK   Lost husband, moved [place] 02-13-2020 ; apartment, 3th floor w/ elevator      Pt is right handed   Social Determinants of Radio broadcast assistant Strain: Low Risk    Difficulty of Paying Living Expenses: Not hard at all  Food Insecurity: No Food Insecurity   Worried About Charity fundraiser in the Last Year: Never true   Arboriculturist in the Last Year: Never true  Transportation Needs: No Transportation Needs   Lack of Transportation (Medical): No   Lack of Transportation (Non-Medical): No  Physical Activity: Sufficiently Active   Days of Exercise per Week: 4 days   Minutes of Exercise per Session: 40 min  Stress: No Stress Concern Present   Feeling of Stress : Only a little  Social Connections: Moderately Integrated   Frequency of Communication with Friends and Family: More than three times a week   Frequency of Social Gatherings with  Friends and Family: More than three times a week   Attends Religious Services: More than 4 times per year   Active Member of Clubs or Organizations: Yes   Attends Archivist Meetings: More than 4 times per year   Marital Status: Widowed  Human resources officer Violence: Not At Risk   Fear of Current or Ex-Partner: No   Emotionally Abused: No   Physically Abused: No   Sexually Abused: No    Current Outpatient Medications  Medication Instructions   buPROPion (WELLBUTRIN XL) 300 MG 24 hr tablet TAKE ONE (1) TABLET BY MOUTH EVERY DAY   cetirizine (ZYRTEC) 10 mg, Oral, At bedtime PRN,     Emgality 120 mg, Subcutaneous, Every 28 days   Evenity 210 mg, Subcutaneous, Every 30 days   ezetimibe (ZETIA) 10 MG tablet TAKE ONE (1) TABLET BY MOUTH EVERY DAY   pantoprazole (PROTONIX) 40 mg, Oral, Daily   sertraline (ZOLOFT) 200 mg, Oral, Daily   Travoprost, BAK Free,  (TRAVATAN) 0.004 % SOLN ophthalmic solution 1 drop, Right Eye, Daily at bedtime       Objective:   Physical Exam BP 122/80 (BP Location: Left Arm, Patient Position: Sitting, Cuff Size: Small)    Pulse 76    Temp 98.4 F (36.9 C) (Oral)    Resp 16    Ht 5\' 5"  (1.651 m)    Wt 170 lb 4 oz (77.2 kg)    SpO2 97%    BMI 28.33 kg/m  General: Well developed, NAD, BMI noted Neck: No  thyromegaly  HEENT:  Normocephalic . Face symmetric, atraumatic Neck: No TTP of the cervical spine, range of motion normal.  Report pain  located at the right mastoid area.  Area is normal to palpation. TMJ: + Click on the right side Lungs:  CTA B Normal respiratory effort, no intercostal retractions, no accessory muscle use. Heart: RRR,  no murmur.  Abdomen:  Not distended, soft, non-tender. No rebound or rigidity.   Lower extremities: no pretibial edema bilaterally  Skin: Exposed areas without rash. Not pale. Not jaundice Neurologic:  alert & oriented X3.  Speech normal, gait appropriate for age and unassisted Strength symmetric and appropriate for age.  Psych: Cognition and judgment appear intact.  Cooperative with normal attention span and concentration.  Behavior appropriate. No anxious or depressed appearing.     Assessment    Assessment Hyperlipidemia: Lipitor causes myalgias, declined other statins Anxiety depression insomnia DJD Migraines, chronic: Saw neurology 12/01/2020: Failed verapamil, nortriptyline and topiramate. Rx Emgality.  Tramadol prn Osteoporosis: 07-2014 T score of -1.7, T score (November 2021) -2.8: sports medicine Rx Prolia PUD due to NSAIDs RLS - robaxin  prn Glaucoma Chronic constipation Skin psoriasis , Dx LSC (lichen )  ~ 7510 , sees derm   PLAN: Here for CPX Dyslipidemia: On Zetia, declines a statins, checking labs. Anxiety, depression, insomnia: Well-controlled with current meds. DJD: Has seen sports medicine's for knee pain. Osteoporosis: On Evenity  prescribed elsewhere Chronic migraines: Improved with Emgality. Increase alkaline phosphatase: Levels are normalized 01/2021 GERD, dysphagia: Symptoms at baseline, tolerating pills better following some dietary changes.  Reluctant to take PPIs regularly.  Advised that if symptoms continue or increase needs to see GI. Vaginal discharge: Saw my partner Dr. Etter Sjogren, had a pelvic exam, was found to have a UTI and treated with antibiotics.  Vaginal swab negative otherwise.  Still has some symptoms, declines gynecology referral. Gait disorder: Had 2 mechanical falls last year, has episodic pain on the right  mastoid since.  Exam is benign.  Fall prevention discussed, offered PT referral- declined. RTC 6 months     In addition to CPX, I addressed all of her chronic medical problems.   This visit occurred during the SARS-CoV-2 public health emergency.  Safety protocols were in place, including screening questions prior to the visit, additional usage of staff PPE, and extensive cleaning of exam room while observing appropriate contact time as indicated for disinfecting solutions.

## 2021-05-27 ENCOUNTER — Other Ambulatory Visit: Payer: Self-pay | Admitting: Internal Medicine

## 2021-05-27 ENCOUNTER — Encounter: Payer: Self-pay | Admitting: Gastroenterology

## 2021-06-06 ENCOUNTER — Ambulatory Visit (INDEPENDENT_AMBULATORY_CARE_PROVIDER_SITE_OTHER): Payer: Medicare PPO | Admitting: *Deleted

## 2021-06-06 DIAGNOSIS — M8000XA Age-related osteoporosis with current pathological fracture, unspecified site, initial encounter for fracture: Secondary | ICD-10-CM | POA: Diagnosis not present

## 2021-06-06 NOTE — Progress Notes (Signed)
Patient tolerate the Will injections of 105mg  of evenity in each arm well.

## 2021-06-07 NOTE — Progress Notes (Signed)
NEUROLOGY FOLLOW UP OFFICE NOTE  Cynthia [name] [id number]  Assessment/Plan:   1.  Cervicogenic headache 2.  Otalgia - unclear etiology, query TMJ dysfunction 3.  Migraine without aura, without status migrainosus, not intractable - improved on Emgality 4.  Transient left hemisensory loss.  TIA vs migraine.  As no prior history, must assume TIA. 5.  Small Acomm aneurysm   1.  To treat cervicogenic pain/otalgia, start gabapentin 100mg  at bedtime.  We can increase dose in 2 weeks if needed 2.   Emgality every 28 days for migraine prevention 3.  Tramadol as needed.  Limit use of pain relievers to no more than 2 days out of week to prevent risk of rebound or medication-overuse headache. 4.  Keep headache diary 5.  Follow up 6 months.   Subjective:  Cynthia [name] is a 76 year old right-handed woman with depression, glaucoma, degenerative joint disease and history of TIA who follows up for headaches   UPDATE: Started Emgality in July. Intensity:  moderate Duration:  3 to 4 hours.  Frequency:  Last migraine 2 months ago  She notes another headache across the back of her head when she lays down.  She does have neck pain with cervical spondylosis.  About a month ago, she started experiencing severe paroxysmal ear pain, mostly right ear but also left ear, every other day.  No aural fullness.  No change in tinnitus.  Hearing slightly worse but not significant.  Sometimes jaw pain.    Current NSAIDS:  ASA 81mg  daily Current analgesics:  tramadol; acetaminophen-caffeine Current triptans:  none Current ergotamine:  no Current anti-emetic: Zofran Current muscle relaxants:  Robaxin 500mg  TID Current anti-anxiolytic:  no Current sleep aide:  Ambien Current Antihypertensive medications:  verapamil CR 180mg  Current Antidepressant medications:  Sertraline 100mg , bupropion 300mg  Current Anticonvulsant medications:  no Current anti-CGRP:  Emgality Current Vitamins/Herbal/Supplements:  B  complex, Mg Current Antihistamines/Decongestants:  Mucinex, Astelin Other therapy: 1 cup black coffee with melatonin 10mg  at bedtime Other medication: Zetia   No subsequent stroke-like symptoms.   Depression and anxiety: Yes Other pain: Joint pain   HISTORY:  She started having headaches infrequently in 2015 but progressively became more frequent in 2016.  Varies, but they are usually left sided, involving the ear, but also may be right sided (involving the sinuses) or in band-like distribution.  It is both throbbing and non-throbbing.  Initially, it is usually 5/10 but severe episodes are 7-8/10.  Sometimes there is nausea.  There is phonophobia.  There is no associated photophobia, visual disturbance or unilateral numbness or weakness.  Initially, it lasts a couple of hours and occuring 15 days out of the month (3 to 4 days severe).  It wakes her up at 4:30 to 5 AM.  She reports sensation of congestion but no runny nose.  CT of sinuses showed opacified right sphenoid sinus and ethmoid air cell.  She was treated a couple of times with Augmentin and prednisone, which was effective for a while but headaches then returned.  She cannot think of a trigger.  They are worse during the summer.  There are no aggravating or relieving factors.  MRI of brain from 06/10/15 was normal.  Sed Rate from 12/27/15 was 10.     She was admitted to [facility] on [facility]/2019 for TIA presenting as transient left sided hemibody numbness (first leg, then arm, then face).  Mostly resolved quickly but lingered for several hours.  CT head showed right internal  capsule hypodensity but follow up MRI of brain showed no acute infarct.  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  2D echo showed EF 60-65% with no source of embolus.  LDL was 162 and Hgb A1c was 5.2.  She was started on ASA 81mg  daily and Plavix 75mg  for 3 weeks followed by ASA alone.  The next morning she had a habitual migraine headache except it lasted all  day.  She was also started on Zetia (she has statin intolerance).  Unclear if event was TIA or complicated migraine.  Due to worsening headache, ordered an MRA of head .  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    Past NSAIDS:  ibuprofen (ulcers) Past analgesics:  Tylenol Past abortive triptans:  sumatriptan (advised to discontinue due to age) Past abortive ergotamine:  no Past muscle relaxants:  no Past anti-emetic:  no Past antihypertensive medications:  no Past antidepressant medications:  Nortriptyline 50mg  Past anticonvulsant medications:  topiramate 25mg  (concerned about glaucoma) Past anti-CGRP:  no Past vitamins/Herbal/Supplements:  no Past antihistamines/decongestants:  no Other past therapies:  none  PAST MEDICAL HISTORY: Past Medical History:  Diagnosis Date   Allergy    Anxiety    Arthritis    Blood transfusion without reported diagnosis    Constipation, chronic    DEGENERATIVE JOINT DISEASE 06/02/2010   Depression    Glaucoma 2014   Right eye   Headache(784.0)    Hyperlipidemia    hx of   Osteopenia    Psoriasis    PUD (peptic ulcer disease) 2012   Upper GI bleed d/t NSAIDs   RLS (restless legs syndrome)    robaxin prn    MEDICATIONS: Current Outpatient Medications on File Prior to Visit  Medication Sig Dispense Refill   buPROPion (WELLBUTRIN XL) 300 MG 24 hr tablet TAKE ONE (1) TABLET BY MOUTH EVERY DAY 90 tablet 1   cetirizine (ZYRTEC) 10 MG tablet Take 10 mg by mouth at bedtime as needed for allergies.     ezetimibe (ZETIA) 10 MG tablet TAKE ONE (1) TABLET BY MOUTH EVERY DAY 90 tablet 1   Galcanezumab-gnlm (EMGALITY) 120 MG/ML SOAJ Inject 120 mg into the skin every 28 (twenty-eight) days. 1.12 mL 5   pantoprazole (PROTONIX) 40 MG tablet Take 1 tablet (40 mg total) by mouth daily. (Patient not taking: Reported on 05/26/2021) 30 tablet 3   Romosozumab-aqqg (EVENITY) 105 MG/1.17ML  SOSY injection Inject 210 mg into the skin every 30 (thirty) days.     sertraline (ZOLOFT) 100 MG tablet TAKE TWO TABLETS BY MOUTH DAILY 180 tablet 1   Travoprost, BAK Free, (TRAVATAN) 0.004 % SOLN ophthalmic solution Place 1 drop into the right eye at bedtime.     No current facility-administered medications on file prior to visit.    ALLERGIES: Allergies  Allergen Reactions   Bee Pollen Other (See Comments)   Motrin [Ibuprofen]     GI bleed   Pollen Extract    Statins     "delibitating pain all over body"   Sulfonamide Derivatives     Pt unsure of reaction   Ciprofloxacin Swelling and Rash    FAMILY HISTORY: Family History  Problem Relation Age of Onset   Aneurysm Mother        brain   Celiac disease Brother    Diabetes Maternal Grandfather    Hypertension Neg Hx    Breast cancer Neg Hx  Coronary artery disease Neg Hx    Colon cancer Neg Hx    Esophageal cancer Neg Hx    Stomach cancer Neg Hx    Rectal cancer Neg Hx       Objective:  Blood pressure 132/78, pulse 89, height 5\' 5"  (1.651 m), weight 173 lb 3.2 oz (78.6 kg), SpO2 97 %. General: No acute distress.  Patient appears well-groomed.   Head:  Normocephalic/atraumatic, left greater than right suboccipital tenderness Face:  crepitus in jaw, tenderness to palpation of TMJ Eyes:  Fundi examined but not visualized Neck: supple, no paraspinal tenderness, full range of motion Heart:  Regular rate and rhythm Lungs:  Clear to auscultation bilaterally Back: No paraspinal tenderness Neurological Exam: alert and oriented to person, place, and time.  Speech fluent and not dysarthric, language intact.  CN II-XII intact. Bulk and tone normal, muscle strength 5/5 throughout.  Sensation to light touch intact.  Deep tendon reflexes 2+ throughout, toes downgoing.  Finger to nose testing intact.  Gait normal, Romberg negative.   Metta Clines, DO  CC: Kathlene November, MD

## 2021-06-08 ENCOUNTER — Encounter: Payer: Self-pay | Admitting: Neurology

## 2021-06-08 ENCOUNTER — Other Ambulatory Visit: Payer: Self-pay

## 2021-06-08 ENCOUNTER — Ambulatory Visit: Payer: Medicare PPO | Admitting: Neurology

## 2021-06-08 VITALS — BP 132/78 | HR 89 | Ht 65.0 in | Wt 173.2 lb

## 2021-06-08 DIAGNOSIS — I671 Cerebral aneurysm, nonruptured: Secondary | ICD-10-CM | POA: Diagnosis not present

## 2021-06-08 DIAGNOSIS — H9203 Otalgia, bilateral: Secondary | ICD-10-CM | POA: Diagnosis not present

## 2021-06-08 DIAGNOSIS — G4486 Cervicogenic headache: Secondary | ICD-10-CM | POA: Diagnosis not present

## 2021-06-08 DIAGNOSIS — G43009 Migraine without aura, not intractable, without status migrainosus: Secondary | ICD-10-CM | POA: Diagnosis not present

## 2021-06-08 DIAGNOSIS — G250 Essential tremor: Secondary | ICD-10-CM | POA: Diagnosis not present

## 2021-06-08 MED ORDER — GABAPENTIN 100 MG PO CAPS: 100.0000 mg | ORAL_CAPSULE | Freq: Every day | ORAL | 5 refills | Status: DC

## 2021-06-08 NOTE — Patient Instructions (Signed)
For posterior headache and ear pain, start gabapentin 100mg  at bedtime. If no improvement in 2 weeks, contact me and we can increase dose. Continue Emgality every 28 days Tramadol as needed, Limit use of pain relievers to no more than 2 days out of week to prevent risk of rebound or medication-overuse headache. Keep headache diary Follow up 6 months.

## 2021-06-10 ENCOUNTER — Other Ambulatory Visit: Payer: Self-pay | Admitting: Internal Medicine

## 2021-06-20 NOTE — Telephone Encounter (Addendum)
Evenity Injection Schedule  Inj #1 - 10/14/20 Inj #2 - 11/17/20 Inj #3 - 12/21/20 Inj #4 - 01/24/21 Inj #5 - 02/25/21 Inj #6 - 03/29/21 Inj #7 - 05/02/21  Inj #8 - 06/06/21 Inj #9 - 07/08/21 Inj #10 - due 08/06/21 Inj #11  Inj #12    PRIOR AUTH expires 10/01/21

## 2021-06-28 ENCOUNTER — Ambulatory Visit (AMBULATORY_SURGERY_CENTER): Payer: Medicare PPO | Admitting: *Deleted

## 2021-06-28 ENCOUNTER — Other Ambulatory Visit: Payer: Self-pay

## 2021-06-28 VITALS — Ht 65.0 in | Wt 173.0 lb

## 2021-06-28 DIAGNOSIS — Z86010 Personal history of colonic polyps: Secondary | ICD-10-CM

## 2021-06-28 NOTE — Progress Notes (Signed)

## 2021-07-05 ENCOUNTER — Ambulatory Visit: Payer: Medicare PPO | Admitting: Family Medicine

## 2021-07-05 ENCOUNTER — Encounter: Payer: Self-pay | Admitting: Family Medicine

## 2021-07-05 ENCOUNTER — Ambulatory Visit: Payer: Self-pay

## 2021-07-05 VITALS — BP 138/82 | Ht 65.0 in | Wt 173.0 lb

## 2021-07-05 DIAGNOSIS — M1712 Unilateral primary osteoarthritis, left knee: Secondary | ICD-10-CM

## 2021-07-05 NOTE — Progress Notes (Signed)
°  Cynthia [name] - 76 y.o. female MRN [id number]  Date of birth: 15-Jan-1946  SUBJECTIVE:  Including CC & ROS.  No chief complaint on file.   Cynthia [name] is a 76 y.o. female that is  presenting with acute worsening of her left knee pain.   Review of Systems See HPI   HISTORY: Past Medical, Surgical, Social, and Family History Reviewed & Updated per EMR.   Pertinent Historical Findings include:  Past Medical History:  Diagnosis Date   Allergy    Anxiety    Arthritis    Blood transfusion without reported diagnosis    Constipation, chronic    DEGENERATIVE JOINT DISEASE 06/02/2010   Depression    Glaucoma 05/15/2012   Right eye   Headache(784.0)    Hyperlipidemia    hx of   Osteopenia    Psoriasis    PUD (peptic ulcer disease) 05/15/2010   Upper GI bleed d/t NSAIDs   RLS (restless legs syndrome)    robaxin prn   TIA (transient ischemic attack) 09/2019    Past Surgical History:  Procedure Laterality Date   ANKLE FRACTURE SURGERY Left    surgery 12-11 (LEFT)   CESAREAN SECTION  05/16/1967   COLONOSCOPY  12/17/2013     PHYSICAL EXAM:  VS: BP 138/82 (BP Location: Left Arm, Patient Position: Sitting)    Ht 5\' 5"  (1.651 m)    Wt 173 lb (78.5 kg)    BMI 28.79 kg/m  Physical Exam Gen: NAD, alert, cooperative with exam, well-appearing MSK:  Neurovascularly intact     Aspiration/Injection Procedure Note Cynthia [name] [birth year]  Procedure: Aspiration and Injection Indications: left knee pain   Procedure Details Consent: Risks of procedure as well as the alternatives and risks of each were explained to the (patient/caregiver).  Consent for procedure obtained. Time Out: Verified patient identification, verified procedure, site/side was marked, verified correct patient position, special equipment/implants available, medications/allergies/relevent history reviewed, required imaging and test results available.  Performed.  The area was cleaned with iodine and alcohol  swabs.    The left knee superior lateral suprapatellar pouch was injected using 3 cc of 1% lidocaine on a 25-gauge 1-1/2 inch needle.  An 18-gauge 1-1/2 needle was used to achieve aspiration.  The syringe was switched and a mixture containing 5 cc's of 32 mg Zilretta and 4 cc's of 0.25% bupivacaine was injected.  Ultrasound was used. Images were obtained in long views showing the injection.   Amount of Fluid Aspirated:  69mL Character of Fluid: clear and straw colored Fluid was sent for: n/a  A sterile dressing was applied.  Patient did tolerate procedure well.       ASSESSMENT & PLAN:   OA (osteoarthritis) of knee Acute on chronic in nature.  Significant effusion on exam today. -Counseled on home exercise therapy and supportive care. -Aspiration and injection. -Could consider referral to orthopedics.

## 2021-07-05 NOTE — Assessment & Plan Note (Signed)
Acute on chronic in nature.  Significant effusion on exam today. -Counseled on home exercise therapy and supportive care. -Aspiration and injection. -Could consider referral to orthopedics.

## 2021-07-05 NOTE — Patient Instructions (Signed)
Good to see you Please try ice as needed  Please send me a message in MyChart with any questions or updates.  Please see me back in 4-5 weeks or as needed if better.   --Dr. Raeford Razor

## 2021-07-08 ENCOUNTER — Encounter: Payer: Self-pay | Admitting: Gastroenterology

## 2021-07-08 ENCOUNTER — Ambulatory Visit (INDEPENDENT_AMBULATORY_CARE_PROVIDER_SITE_OTHER): Payer: Medicare PPO | Admitting: *Deleted

## 2021-07-08 ENCOUNTER — Encounter: Payer: Self-pay | Admitting: *Deleted

## 2021-07-08 DIAGNOSIS — M8000XG Age-related osteoporosis with current pathological fracture, unspecified site, subsequent encounter for fracture with delayed healing: Secondary | ICD-10-CM | POA: Diagnosis not present

## 2021-07-08 NOTE — Progress Notes (Signed)
Patient is here for nurse visit for her ninth Evenity injection.  Patient received bilateral sq injections. She tolerated injections well. She will return next month for her next Evenity.

## 2021-07-09 ENCOUNTER — Other Ambulatory Visit: Payer: Self-pay | Admitting: Neurology

## 2021-07-11 ENCOUNTER — Encounter: Payer: Self-pay | Admitting: Gastroenterology

## 2021-07-11 ENCOUNTER — Other Ambulatory Visit: Payer: Self-pay

## 2021-07-11 ENCOUNTER — Ambulatory Visit (AMBULATORY_SURGERY_CENTER): Payer: Medicare PPO | Admitting: Gastroenterology

## 2021-07-11 VITALS — BP 151/63 | HR 67 | Temp 97.1°F | Resp 16 | Ht 65.0 in | Wt 173.0 lb

## 2021-07-11 DIAGNOSIS — D120 Benign neoplasm of cecum: Secondary | ICD-10-CM

## 2021-07-11 DIAGNOSIS — Z86010 Personal history of colonic polyps: Secondary | ICD-10-CM

## 2021-07-11 MED ORDER — SODIUM CHLORIDE 0.9 % IV SOLN: 500.0000 mL | Freq: Once | INTRAVENOUS | Status: DC

## 2021-07-11 NOTE — Progress Notes (Signed)
Called to room to assist during endoscopic procedure.  Patient ID and intended procedure confirmed with present staff. Received instructions for my participation in the procedure from the performing physician.  

## 2021-07-11 NOTE — Progress Notes (Signed)
HPI: This is a woman with h/o polyps  Colonoscopy 2004 Dr. Wynetta Emery was normal. Colonoscopy 12/2013 Dr. Ardis Hughs one subCM adenoma   ROS: complete GI ROS as described in HPI, all other review negative.  Constitutional:  No unintentional weight loss   Past Medical History:  Diagnosis Date   Allergy    Anxiety    Arthritis    Blood transfusion without reported diagnosis    Constipation, chronic    DEGENERATIVE JOINT DISEASE 06/02/2010   Depression    Glaucoma 05/15/2012   Right eye   Headache(784.0)    Hyperlipidemia    hx of   Osteopenia    Psoriasis    PUD (peptic ulcer disease) 05/15/2010   Upper GI bleed d/t NSAIDs   RLS (restless legs syndrome)    robaxin prn   TIA (transient ischemic attack) 09/2019    Past Surgical History:  Procedure Laterality Date   ANKLE FRACTURE SURGERY Left    surgery 12-11 (LEFT)   CESAREAN SECTION  05/16/1967   COLONOSCOPY  12/17/2013    Current Outpatient Medications  Medication Sig Dispense Refill   aspirin EC 81 MG tablet Take 81 mg by mouth daily. Swallow whole.     buPROPion (WELLBUTRIN XL) 300 MG 24 hr tablet TAKE ONE (1) TABLET BY MOUTH EVERY DAY 90 tablet 1   cetirizine (ZYRTEC) 10 MG tablet Take 10 mg by mouth at bedtime as needed for allergies.     ezetimibe (ZETIA) 10 MG tablet TAKE ONE (1) TABLET BY MOUTH EVERY DAY 90 tablet 1   gabapentin (NEURONTIN) 100 MG capsule Take 1 capsule (100 mg total) by mouth at bedtime. 30 capsule 5   Romosozumab-aqqg (EVENITY) 105 MG/1.17ML SOSY injection Inject 210 mg into the skin every 30 (thirty) days.     sertraline (ZOLOFT) 100 MG tablet TAKE TWO TABLETS BY MOUTH DAILY 180 tablet 1   Travoprost, BAK Free, (TRAVATAN) 0.004 % SOLN ophthalmic solution Place 1 drop into the right eye at bedtime.     Galcanezumab-gnlm (EMGALITY) 120 MG/ML SOAJ Inject 120 mg into the skin every 28 (twenty-eight) days. 1.12 mL 5   methocarbamol (ROBAXIN) 500 MG tablet TAKE ONE (1) TABLET BY MOUTH 3 TIMES DAILY AS  NEEDED FOR MUSCLE SPASMS 90 tablet 1   Current Facility-Administered Medications  Medication Dose Route Frequency Provider Last Rate Last Admin   0.9 %  sodium chloride infusion  500 mL Intravenous Once Milus Banister, MD        Allergies as of 07/11/2021 - Review Complete 07/11/2021  Allergen Reaction Noted   Bee pollen Other (See Comments) 05/18/2014   Motrin [ibuprofen]  11/08/2010   Pollen extract  05/18/2014   Statins  12/04/2013   Sulfonamide derivatives  12/06/2007   Ciprofloxacin Swelling and Rash 11/08/2010    Family History  Problem Relation Age of Onset   Aneurysm Mother        brain   Celiac disease Brother    Diabetes Maternal Grandfather    Hypertension Neg Hx    Breast cancer Neg Hx    Coronary artery disease Neg Hx    Colon cancer Neg Hx    Esophageal cancer Neg Hx    Stomach cancer Neg Hx    Rectal cancer Neg Hx     Social History   Socioeconomic History   Marital status: Widowed    Spouse name: Not on file   Number of children: 1   Years of education: Not on file  Highest education level: Doctorate  Occupational History   Occupation: retired Environmental consultant: RETIRED  Tobacco Use   Smoking status: Never    Passive exposure: Never   Smokeless tobacco: Never  Vaping Use   Vaping Use: Never used  Substance and Sexual Activity   Alcohol use: Yes    Comment: socially   Drug use: No   Sexual activity: Not Currently  Other Topics Concern   Not on file  Social History Narrative   2 step children, 1 child , 7 GK   Lost husband, moved [place] burn 02-13-2020 ; apartment, 3th floor w/ elevator      Pt is right handed   Social Determinants of Radio broadcast assistant Strain: Low Risk    Difficulty of Paying Living Expenses: Not hard at all  Food Insecurity: No Food Insecurity   Worried About Charity fundraiser in the Last Year: Never true   Arboriculturist in the Last Year: Never true  Transportation Needs: No  Transportation Needs   Lack of Transportation (Medical): No   Lack of Transportation (Non-Medical): No  Physical Activity: Sufficiently Active   Days of Exercise per Week: 4 days   Minutes of Exercise per Session: 40 min  Stress: No Stress Concern Present   Feeling of Stress : Only a little  Social Connections: Moderately Integrated   Frequency of Communication with Friends and Family: More than three times a week   Frequency of Social Gatherings with Friends and Family: More than three times a week   Attends Religious Services: More than 4 times per year   Active Member of [employer] or Organizations: Yes   Attends Archivist Meetings: More than 4 times per year   Marital Status: Widowed  Human resources officer Violence: Not At Risk   Fear of Current or Ex-Partner: No   Emotionally Abused: No   Physically Abused: No   Sexually Abused: No     Physical Exam: BP (!) 144/93    Pulse 75    Temp (!) 97.1 F (36.2 C)    Ht 5\' 5"  (1.651 m)    Wt 173 lb (78.5 kg)    SpO2 98%    BMI 28.79 kg/m  Constitutional: generally well-appearing Psychiatric: alert and oriented x3 Lungs: CTA bilaterally Heart: no MCR  Assessment and plan: 76 y.o. female with h/o polyps  Surveillance colonoscopy today  Care is appropriate for the ambulatory setting.  Owens Loffler, MD [facility] 07/11/2021, 8:21 AM

## 2021-07-11 NOTE — Op Note (Signed)
[name] Patient Name: Cynthia [name] Procedure Date: 07/11/2021 8:48 AM MRN: [id number] Endoscopist: Milus Banister , MD Age: 76 Referring MD:  Date of Birth: [birth year] Gender: Female Account #: [id number] Procedure:                Colonoscopy Indications:              High risk colon cancer surveillance: Personal                            history of colonic polyps; Colonoscopy 2017 Dr.                            Ardis Hughs single subCM adenoma Medicines:                Monitored Anesthesia Care Procedure:                Pre-Anesthesia Assessment:                           - Prior to the procedure, a History and Physical                            was performed, and patient medications and                            allergies were reviewed. The patient's tolerance of                            previous anesthesia was also reviewed. The risks                            and benefits of the procedure and the sedation                            options and risks were discussed with the patient.                            All questions were answered, and informed consent                            was obtained. Prior Anticoagulants: The patient has                            taken no previous anticoagulant or antiplatelet                            agents. ASA Grade Assessment: II - A patient with                            mild systemic disease. After reviewing the risks                            and benefits, the patient was deemed in  satisfactory condition to undergo the procedure.                           After obtaining informed consent, the colonoscope                            was passed under direct vision. Throughout the                            procedure, the patient's blood pressure, pulse, and                            oxygen saturations were monitored continuously. The                            CF [id number] #[id number] was introduced  through the anus                            and advanced to the the cecum, identified by                            appendiceal orifice and ileocecal valve. The                            colonoscopy was performed without difficulty. The                            patient tolerated the procedure well. The quality                            of the bowel preparation was good. The ileocecal                            valve, appendiceal orifice, and rectum were                            photographed. Scope In: 9:11:58 AM Scope Out: 9:23:43 AM Scope Withdrawal Time: 0 hours 8 minutes 58 seconds  Total Procedure Duration: 0 hours 11 minutes 45 seconds  Findings:                 A 8 mm polyp was found in the cecum. The polyp was                            sessile. The polyp was removed with a cold snare.                            Resection and retrieval were complete.                           The exam was otherwise without abnormality on                            direct and retroflexion views. Complications:  No immediate complications. Estimated blood loss:                            None. Estimated Blood Loss:     Estimated blood loss: none. Impression:               - One 8 mm polyp in the cecum, removed with a cold                            snare. Resected and retrieved.                           - The examination was otherwise normal on direct                            and retroflexion views. Recommendation:           - Patient has a contact number available for                            emergencies. The signs and symptoms of potential                            delayed complications were discussed with the                            patient. Return to normal activities tomorrow.                            Written discharge instructions were provided to the                            patient.                           - Resume previous diet.                           -  Continue present medications.                           - Await pathology results. Milus Banister, MD 07/11/2021 9:25:31 AM This report has been signed electronically.

## 2021-07-11 NOTE — Progress Notes (Signed)
Pt's states no medical or surgical changes since previsit or office visit.   VS taken by CW 

## 2021-07-11 NOTE — Progress Notes (Signed)
To PACU, VSS. Report to [email or web address] 

## 2021-07-11 NOTE — Patient Instructions (Signed)
Handout provided on polyps.   YOU HAD AN ENDOSCOPIC PROCEDURE TODAY AT THE [facility]:   Refer to the procedure report that was given to you for any specific questions about what was found during the examination.  If the procedure report does not answer your questions, please call your gastroenterologist to clarify.  If you requested that your care partner not be given the details of your procedure findings, then the procedure report has been included in a sealed envelope for you to review at your convenience later.  YOU SHOULD EXPECT: Some feelings of bloating in the abdomen. Passage of more gas than usual.  Walking can help get rid of the air that was put into your GI tract during the procedure and reduce the bloating. If you had a lower endoscopy (such as a colonoscopy or flexible sigmoidoscopy) you may notice spotting of blood in your stool or on the toilet paper. If you underwent a bowel prep for your procedure, you may not have a normal bowel movement for a few days.  Please Note:  You might notice some irritation and congestion in your nose or some drainage.  This is from the oxygen used during your procedure.  There is no need for concern and it should clear up in a day or so.  SYMPTOMS TO REPORT IMMEDIATELY:  Following lower endoscopy (colonoscopy or flexible sigmoidoscopy):  Excessive amounts of blood in the stool  Significant tenderness or worsening of abdominal pains  Swelling of the abdomen that is new, acute  Fever of 100F or higher  For urgent or emergent issues, a gastroenterologist can be reached at any hour by calling [phone]. Do not use MyChart messaging for urgent concerns.    DIET:  We do recommend a small meal at first, but then you may proceed to your regular diet.  Drink plenty of fluids but you should avoid alcoholic beverages for 24 hours.  ACTIVITY:  You should plan to take it easy for the rest of today and you should NOT DRIVE or use heavy  machinery until tomorrow (because of the sedation medicines used during the test).    FOLLOW UP: Our staff will call the number listed on your records 48-72 hours following your procedure to check on you and address any questions or concerns that you may have regarding the information given to you following your procedure. If we do not reach you, we will leave a message.  We will attempt to reach you two times.  During this call, we will ask if you have developed any symptoms of COVID 19. If you develop any symptoms (ie: fever, flu-like symptoms, shortness of breath, cough etc.) before then, please call [phone].  If you test positive for Covid 19 in the 2 weeks post procedure, please call and report this information to us.    If any biopsies were taken you will be contacted by phone or by letter within the next 1-3 weeks.  Please call us at [phone] if you have not heard about the biopsies in 3 weeks.    SIGNATURES/CONFIDENTIALITY: You and/or your care partner have signed paperwork which will be entered into your electronic medical record.  These signatures attest to the fact that that the information above on your After Visit Summary has been reviewed and is understood.  Full responsibility of the confidentiality of this discharge information lies with you and/or your care-partner.  

## 2021-07-13 ENCOUNTER — Telehealth: Payer: Self-pay | Admitting: *Deleted

## 2021-07-13 NOTE — Telephone Encounter (Signed)
?  Follow up Call- ? ?Call back number 07/11/2021  ?Post procedure Call Back phone  # [phone]  ?Permission to leave phone message Yes  ?Some recent data might be hidden  ?  ? ?Patient questions: ? ?Do you have a fever, pain , or abdominal swelling? No. ?Pain Score  0 * ? ?Have you tolerated food without any problems? Yes.   ? ?Have you been able to return to your normal activities? Yes.   ? ?Do you have any questions about your discharge instructions: ?Diet   No. ?Medications  No. ?Follow up visit  No. ? ?Do you have questions or concerns about your Care? No. ? ?Actions: ?* If pain score is 4 or above: ?No action needed, pain <4. ? ? ?

## 2021-07-13 NOTE — Telephone Encounter (Signed)
First follow up call attempt.  Lvm ?

## 2021-07-18 ENCOUNTER — Encounter: Payer: Self-pay | Admitting: Gastroenterology

## 2021-08-10 ENCOUNTER — Ambulatory Visit (INDEPENDENT_AMBULATORY_CARE_PROVIDER_SITE_OTHER): Payer: Medicare PPO | Admitting: *Deleted

## 2021-08-10 DIAGNOSIS — M8000XG Age-related osteoporosis with current pathological fracture, unspecified site, subsequent encounter for fracture with delayed healing: Secondary | ICD-10-CM

## 2021-08-10 NOTE — Progress Notes (Signed)
Patient is here for nurse visit for her tenth Evenity injection.  ?Patient received bilateral sq injections. She tolerated injections well. She will return next month for her next Evenity.  ?

## 2021-08-23 ENCOUNTER — Encounter: Payer: Self-pay | Admitting: Family Medicine

## 2021-08-23 ENCOUNTER — Ambulatory Visit (HOSPITAL_BASED_OUTPATIENT_CLINIC_OR_DEPARTMENT_OTHER)
Admission: RE | Admit: 2021-08-23 | Discharge: 2021-08-23 | Disposition: A | Discharge status: Home / Self Care | Payer: Medicare PPO | Source: Ambulatory Visit | Attending: Family Medicine | Admitting: Family Medicine

## 2021-08-23 ENCOUNTER — Ambulatory Visit: Payer: Medicare PPO | Admitting: Family Medicine

## 2021-08-23 VITALS — BP 102/70 | Ht 65.0 in | Wt 173.0 lb

## 2021-08-23 DIAGNOSIS — S161XXA Strain of muscle, fascia and tendon at neck level, initial encounter: Secondary | ICD-10-CM | POA: Diagnosis not present

## 2021-08-23 DIAGNOSIS — M4316 Spondylolisthesis, lumbar region: Secondary | ICD-10-CM | POA: Diagnosis not present

## 2021-08-23 DIAGNOSIS — M533 Sacrococcygeal disorders, not elsewhere classified: Secondary | ICD-10-CM | POA: Diagnosis not present

## 2021-08-23 DIAGNOSIS — M545 Low back pain, unspecified: Secondary | ICD-10-CM | POA: Diagnosis not present

## 2021-08-23 DIAGNOSIS — M542 Cervicalgia: Secondary | ICD-10-CM | POA: Diagnosis not present

## 2021-08-23 NOTE — Assessment & Plan Note (Signed)
Acute on chronic in nature.  She has stiffness and limited range of motion.  She is more associated with underlying degenerative changes and associated trigger points. ?-Counseled on home exercise therapy and supportive care. ?-X-ray. ?-Can consider injection versus shockwave therapy. ?

## 2021-08-23 NOTE — Patient Instructions (Signed)
Good to see you ?I will call with the xray results.   ?Please send me a message in MyChart with any questions or updates.  ?Please see me back to start shockwave therapy.  ? ?--Dr. Raeford Razor ? ?

## 2021-08-23 NOTE — Progress Notes (Signed)
?  Cynthia [name] - 76 y.o. female MRN [id number]  Date of birth: [birth year] ? ?SUBJECTIVE:  Including CC & ROS.  ?No chief complaint on file. ? ? ?Cynthia [name] is a 76 y.o. female that is presenting with acute on chronic neck and low back pain.  No recent injuries or inciting event.  She has a long history of neck pain and does wake up with headaches intermittently.  The low back pain is more localized over the SI joint.  It is worse with prolonged standing.  No radicular pain. ? ?Independent review of the CT abdomen and pelvis from 2012 shows degenerative disc disease and a mild dextroscoliosis. ? ? ?Review of Systems ?See HPI  ? ?HISTORY: Past Medical, Surgical, Social, and Family History Reviewed & Updated per EMR.   ?Pertinent Historical Findings include: ? ?Past Medical History:  ?Diagnosis Date  ? Allergy   ? Anxiety   ? Arthritis   ? Blood transfusion without reported diagnosis   ? Constipation, chronic   ? DEGENERATIVE JOINT DISEASE 06/02/2010  ? Depression   ? Glaucoma 05/15/2012  ? Right eye  ? Headache(784.0)   ? Hyperlipidemia   ? hx of  ? Osteopenia   ? Psoriasis   ? PUD (peptic ulcer disease) 05/15/2010  ? Upper GI bleed d/t NSAIDs  ? RLS (restless legs syndrome)   ? robaxin prn  ? TIA (transient ischemic attack) 09/2019  ? ? ?Past Surgical History:  ?Procedure Laterality Date  ? ANKLE FRACTURE SURGERY Left   ? surgery 12-11 (LEFT)  ? CESAREAN SECTION  05/16/1967  ? COLONOSCOPY  12/17/2013  ? ? ? ?PHYSICAL EXAM:  ?VS: BP 102/70 (BP Location: Left Arm, Patient Position: Sitting)   Ht '5\' 5"'$  (1.651 m)   Wt 173 lb (78.5 kg)   BMI 28.79 kg/m?  ?Physical Exam ?Gen: NAD, alert, cooperative with exam, well-appearing ?MSK:  ?Neurovascularly intact   ? ? ? ? ?ASSESSMENT & PLAN:  ? ?Sacroiliac joint dysfunction of both sides ?Acutely occurring.  Does endorse more of the pain around the SI joint as opposed to the lower back. ?-Counseled on home exercise therapy and supportive care. ?-Counseled on  compression. ?-X-ray. ?-Could consider injection or physical therapy. ? ?Cervical strain ?Acute on chronic in nature.  She has stiffness and limited range of motion.  She is more associated with underlying degenerative changes and associated trigger points. ?-Counseled on home exercise therapy and supportive care. ?-X-ray. ?-Can consider injection versus shockwave therapy. ? ? ? ? ?

## 2021-08-23 NOTE — Assessment & Plan Note (Signed)
Acutely occurring.  Does endorse more of the pain around the SI joint as opposed to the lower back. ?-Counseled on home exercise therapy and supportive care. ?-Counseled on compression. ?-X-ray. ?-Could consider injection or physical therapy. ?

## 2021-08-24 NOTE — Telephone Encounter (Signed)
Prior auth renewal initiated via [email or web address] ?KEY: [id number] ? ? ?

## 2021-08-25 DIAGNOSIS — Z961 Presence of intraocular lens: Secondary | ICD-10-CM | POA: Diagnosis not present

## 2021-08-25 DIAGNOSIS — H5203 Hypermetropia, bilateral: Secondary | ICD-10-CM | POA: Diagnosis not present

## 2021-08-25 DIAGNOSIS — H26493 Other secondary cataract, bilateral: Secondary | ICD-10-CM | POA: Diagnosis not present

## 2021-08-25 DIAGNOSIS — H52203 Unspecified astigmatism, bilateral: Secondary | ICD-10-CM | POA: Diagnosis not present

## 2021-08-25 DIAGNOSIS — H43813 Vitreous degeneration, bilateral: Secondary | ICD-10-CM | POA: Diagnosis not present

## 2021-08-25 DIAGNOSIS — H401111 Primary open-angle glaucoma, right eye, mild stage: Secondary | ICD-10-CM | POA: Diagnosis not present

## 2021-08-25 DIAGNOSIS — H3554 Dystrophies primarily involving the retinal pigment epithelium: Secondary | ICD-10-CM | POA: Diagnosis not present

## 2021-08-25 DIAGNOSIS — H353132 Nonexudative age-related macular degeneration, bilateral, intermediate dry stage: Secondary | ICD-10-CM | POA: Diagnosis not present

## 2021-08-25 DIAGNOSIS — H35363 Drusen (degenerative) of macula, bilateral: Secondary | ICD-10-CM | POA: Diagnosis not present

## 2021-08-29 ENCOUNTER — Telehealth: Payer: Self-pay | Admitting: Family Medicine

## 2021-08-29 NOTE — Telephone Encounter (Signed)
Left VM for patient. If she calls back please have her speak with a nurse/CMA and inform that her neck and lumbar xray are showing minor degenerative changes. We can continue with physical therapy or can try injection or shockwave therapy.  ? ?If any questions then please take the best time and phone number to call and I will try to call her back.  ? ?Rosemarie Ax, MD ?[facility] ?08/29/2021, 12:58 PM ? ? ?

## 2021-08-30 NOTE — Telephone Encounter (Signed)
[employer] faxed:  ? Evenity is approved 10/02/21 to 10/03/22.  ?Auth # [id number].  ?

## 2021-08-31 NOTE — Telephone Encounter (Addendum)
Evenity Injection Schedule ?  ?Inj #1 - 10/14/20 ?Inj #2 - 11/17/20 ?Inj #3 - 12/21/20 ?Inj #4 - 01/24/21 ?Inj #5 - 02/25/21 ?Inj #6 - 03/29/21 ?Inj #7 - 05/02/21  ?Inj #8 - 06/06/21 ?Inj #9 - 07/08/21 ?Inj #10 - 08/10/21 ?Inj #11 - 09/14/21 ?Inj #12  - due 10/16/21 ?  ?Prior auth renewed  ?PA# [id number] ?Valid 10/02/21-10/03/22 ?

## 2021-09-06 ENCOUNTER — Other Ambulatory Visit: Payer: Self-pay | Admitting: Neurology

## 2021-09-14 ENCOUNTER — Ambulatory Visit (INDEPENDENT_AMBULATORY_CARE_PROVIDER_SITE_OTHER): Payer: Medicare PPO | Admitting: *Deleted

## 2021-09-14 DIAGNOSIS — M8000XG Age-related osteoporosis with current pathological fracture, unspecified site, subsequent encounter for fracture with delayed healing: Secondary | ICD-10-CM

## 2021-09-14 NOTE — Progress Notes (Signed)
Patient is here for nurse visit for her 11th Evenity injection.  ?Patient received bilateral sq injections. She tolerated injections well. She will return next month for her next Evenity and follow up with Dr. Raeford Razor.  ?

## 2021-09-22 ENCOUNTER — Telehealth: Payer: Self-pay | Admitting: Internal Medicine

## 2021-09-22 MED ORDER — ZOLPIDEM TARTRATE 5 MG PO TABS: 5.0000 mg | ORAL_TABLET | Freq: Every evening | ORAL | 1 refills | Status: DC | PRN

## 2021-09-22 NOTE — Telephone Encounter (Signed)
Refill request for Ambien '5mg'$ - no longer on med list. Please advise.  ?

## 2021-09-22 NOTE — Addendum Note (Signed)
Addended by: Kathlene November E on: 09/22/2021 01:28 PM ? ? Modules accepted: Orders ? ?

## 2021-09-22 NOTE — Telephone Encounter (Signed)
PDMP okay. ?Ambien was removed from her medication list because she did not need it in the recent months. ?Okay for her to take it as needed  thus Rx sent. ?

## 2021-09-27 ENCOUNTER — Encounter: Payer: Self-pay | Admitting: Internal Medicine

## 2021-09-27 ENCOUNTER — Ambulatory Visit: Payer: Medicare PPO | Admitting: Internal Medicine

## 2021-09-27 VITALS — BP 122/68 | HR 72 | Temp 98.4°F | Resp 16 | Ht 65.0 in | Wt 165.0 lb

## 2021-09-27 DIAGNOSIS — N898 Other specified noninflammatory disorders of vagina: Secondary | ICD-10-CM | POA: Diagnosis not present

## 2021-09-27 DIAGNOSIS — F32A Depression, unspecified: Secondary | ICD-10-CM

## 2021-09-27 DIAGNOSIS — R197 Diarrhea, unspecified: Secondary | ICD-10-CM

## 2021-09-27 DIAGNOSIS — K219 Gastro-esophageal reflux disease without esophagitis: Secondary | ICD-10-CM

## 2021-09-27 DIAGNOSIS — F419 Anxiety disorder, unspecified: Secondary | ICD-10-CM | POA: Diagnosis not present

## 2021-09-27 NOTE — Progress Notes (Signed)
? ?Subjective:  ? ? Patient ID: Cynthia [name], female    DOB: 1945/11/17, 76 y.o.   MRN: [id number] ? ?DOS:  09/27/2021 ?Type of visit - description: f/u ? ?Several concerns. ?4 days ago, she chocked with a vitamin pill, it stay on her throat and irritated it. ?Still has sore throat. ?Denies fever chills.  No runny nose.  Admits to mild cough she believes related to allergy ? ? ?Also, for the last 2 weeks having on and off diarrhea.  The stools are loose, no mucus or blood, at times up to 5 episodes of diarrhea a day.  Some nausea but no vomiting.  Fortunately she has been able to drink plenty of fluids. ?No recent antibiotics. ? ?Still has some vaginal discharge.  Denies dysuria, gross hematuria.  No vaginal bleeding. ? ?Review of Systems ?See above  ? ?Past Medical History:  ?Diagnosis Date  ? Allergy   ? Anxiety   ? Arthritis   ? Blood transfusion without reported diagnosis   ? Constipation, chronic   ? DEGENERATIVE JOINT DISEASE 06/02/2010  ? Depression   ? Glaucoma 05/15/2012  ? Right eye  ? Headache(784.0)   ? Hyperlipidemia   ? hx of  ? Osteopenia   ? Psoriasis   ? PUD (peptic ulcer disease) 05/15/2010  ? Upper GI bleed d/t NSAIDs  ? RLS (restless legs syndrome)   ? robaxin prn  ? TIA (transient ischemic attack) 09/2019  ? ? ?Past Surgical History:  ?Procedure Laterality Date  ? ANKLE FRACTURE SURGERY Left   ? surgery 12-11 (LEFT)  ? CESAREAN SECTION  05/16/1967  ? COLONOSCOPY  12/17/2013  ? ? ?Current Outpatient Medications  ?Medication Instructions  ? aspirin EC 81 mg, Oral, Daily, Swallow whole.  ? buPROPion (WELLBUTRIN XL) 300 MG 24 hr tablet TAKE ONE (1) TABLET BY MOUTH EVERY DAY  ? cetirizine (ZYRTEC) 10 mg, Oral, At bedtime PRN,    ? EMGALITY 120 MG/ML SOAJ INJECT 120 MILLIGRAMS INTO THE SKIN EVERY 28 DAYS  ? Evenity 210 mg, Subcutaneous, Every 30 days  ? ezetimibe (ZETIA) 10 MG tablet TAKE ONE (1) TABLET BY MOUTH EVERY DAY  ? gabapentin (NEURONTIN) 100 mg, Oral, Daily at bedtime  ? methocarbamol  (ROBAXIN) 500 MG tablet TAKE ONE (1) TABLET BY MOUTH 3 TIMES DAILY AS NEEDED FOR MUSCLE SPASMS  ? sertraline (ZOLOFT) 100 MG tablet TAKE TWO TABLETS BY MOUTH DAILY  ? Travoprost, BAK Free, (TRAVATAN) 0.004 % SOLN ophthalmic solution 1 drop, Right Eye, Daily at bedtime  ? zolpidem (AMBIEN) 5 mg, Oral, At bedtime PRN  ? ? ?   ?Objective:  ? Physical Exam ?BP 122/68 (BP Location: Left Arm, Patient Position: Sitting, Cuff Size: Small)   Pulse 72   Temp 98.4 ?F (36.9 ?C) (Oral)   Resp 16   Ht '5\' 5"'$  (1.651 m)   Wt 165 lb (74.8 kg)   SpO2 94%   BMI 27.46 kg/m?  ?General:   ?Well developed, NAD, BMI noted.  ?HEENT:  ?Normocephalic . Face symmetric, atraumatic ?Throat: Symmetric, not red.  TMs normal.  Nose not congested ?Lungs:  ?CTA B ?Normal respiratory effort, no intercostal retractions, no accessory muscle use. ?Heart: RRR,  no murmur.  ?Abdomen:  ?Not distended, soft, minimal if any discomfort, periumbilical, no mass, rebound.   ?skin: Not pale. Not jaundice ?Lower extremities: no pretibial edema bilaterally  ?Neurologic:  ?alert & oriented X3.  ?Speech normal, gait appropriate for age and unassisted ?Psych--  ?Cognition and judgment  appear intact.  ?Cooperative with normal attention span and concentration.  ?Behavior appropriate. ?No anxious or depressed appearing. ? ?   ?Assessment   ? ? Assessment ?Hyperlipidemia: Lipitor causes myalgias, declined other statins ?Anxiety depression insomnia ?DJD ?Migraines, chronic: ?Saw neurology 12/01/2020: Failed verapamil, nortriptyline and topiramate. ?Rx Emgality.  Tramadol prn ?Osteoporosis: 07-2014 T score of -1.7, T score (November 2021) -2.8: sports medicine Rx Prolia ?PUD due to NSAIDs ?RLS - robaxin  prn ?Neuropathy: dx sports meds  ?Glaucoma ?Chronic constipation ?Skin psoriasis , Dx LSC (lichen )  ~ 0211 , sees derm ? ? ?PLAN: ?Anxiety, depression, insomnia:  ?PHQ-9 =  8, continue Wellbutrin, sertraline and Ambien.  Contract signed. ?GERD, dysphagia: had a barium  swallow before was rec to take pills with yogurt to prevent choking.pt is reminded of that advise. ?Diarrhea: New issue, no red flags, recommend observation for now, good hydration, OTCs as needed.   ?Vaginal discharge without bleeding: Still an issue, refer to gynecology. ?Preventive care: ?Had a colonoscopy 06-2021, + polyps, no recall d/t age per GI letter. ?RTC, 4 months ?  ?Time spent 30 min: extensive education about proper way to swallow pills, adressed a new prob (diarrhea), still concerned about vag d/c requiring a referral  ?

## 2021-09-27 NOTE — Assessment & Plan Note (Signed)
Anxiety, depression, insomnia:  ?PHQ-9 =  8, continue Wellbutrin, sertraline and Ambien.  Contract signed. ?GERD, dysphagia: had a barium swallow before was rec to take pills with yogurt to prevent choking.pt is reminded of that advise. ?Diarrhea: New issue, no red flags, recommend observation for now, good hydration, OTCs as needed.   ?Vaginal discharge without bleeding: Still an issue, refer to gynecology. ?Preventive care: ?Had a colonoscopy 06-2021, + polyps, no recall d/t age per GI letter. ?RTC, 4 months ?

## 2021-09-27 NOTE — Patient Instructions (Addendum)
Take all medicines and supplements with yogurt. ? ?Good hydration ?Imodium OTC as needed for diarrhea ?If you continue with diarrhea in the next 10 days or if you have blood in the stools, fever, chills, severe pain please let us know. ? ?See the gynecologist ? ? ?[place], [place] ?Come back for a checkup in 4 months ?

## 2021-10-13 ENCOUNTER — Other Ambulatory Visit (HOSPITAL_COMMUNITY)
Admission: RE | Admit: 2021-10-13 | Discharge: 2021-10-13 | Disposition: A | Discharge status: Home / Self Care | Payer: Medicare PPO | Source: Ambulatory Visit | Attending: Obstetrics & Gynecology | Admitting: Obstetrics & Gynecology

## 2021-10-13 ENCOUNTER — Ambulatory Visit: Payer: Medicare PPO | Admitting: Obstetrics & Gynecology

## 2021-10-13 ENCOUNTER — Encounter: Payer: Self-pay | Admitting: Obstetrics & Gynecology

## 2021-10-13 VITALS — BP 115/62 | HR 88 | Ht 65.0 in | Wt 165.0 lb

## 2021-10-13 DIAGNOSIS — N898 Other specified noninflammatory disorders of vagina: Secondary | ICD-10-CM

## 2021-10-13 NOTE — Progress Notes (Signed)
Patient ID: Cynthia [name], female   DOB: 07-27-1945, 76 y.o.   MRN: [id number]  Cc: vaginal discharge   HPI Cynthia [name] is a 76 y.o. female.  G1P1 Postmenopausal, referred by PCP due to vaginal discharge that started a few months ago. Vaginal testing was not diagnostic. She states sx have resolved without treatment. HPI  Past Medical History:  Diagnosis Date   Allergy    Anxiety    Arthritis    Blood transfusion without reported diagnosis    Constipation, chronic    DEGENERATIVE JOINT DISEASE 06/02/2010   Depression    Glaucoma 05/15/2012   Right eye   Headache(784.0)    Hyperlipidemia    hx of   Osteopenia    Psoriasis    PUD (peptic ulcer disease) 05/15/2010   Upper GI bleed d/t NSAIDs   RLS (restless legs syndrome)    robaxin prn   TIA (transient ischemic attack) 09/2019    Past Surgical History:  Procedure Laterality Date   ANKLE FRACTURE SURGERY Left    surgery 12-11 (LEFT)   CESAREAN SECTION  05/16/1967   COLONOSCOPY  12/17/2013    Family History  Problem Relation Age of Onset   Aneurysm Mother        brain   Celiac disease Brother    Diabetes Maternal Grandfather    Hypertension Neg Hx    Breast cancer Neg Hx    Coronary artery disease Neg Hx    Colon cancer Neg Hx    Esophageal cancer Neg Hx    Stomach cancer Neg Hx    Rectal cancer Neg Hx     Social History Social History   Tobacco Use   Smoking status: Never    Passive exposure: Never   Smokeless tobacco: Never  Vaping Use   Vaping Use: Never used  Substance Use Topics   Alcohol use: Yes    Comment: socially   Drug use: No    Allergies  Allergen Reactions   Bee Pollen Other (See Comments)   Motrin [Ibuprofen]     GI bleed   Pollen Extract    Statins     "delibitating pain all over body"   Sulfonamide Derivatives     Pt unsure of reaction   Ciprofloxacin Swelling and Rash    Current Outpatient Medications  Medication Sig Dispense Refill   aspirin EC 81 MG tablet Take 81  mg by mouth daily. Swallow whole.     buPROPion (WELLBUTRIN XL) 300 MG 24 hr tablet TAKE ONE (1) TABLET BY MOUTH EVERY DAY 90 tablet 1   cetirizine (ZYRTEC) 10 MG tablet Take 10 mg by mouth at bedtime as needed for allergies.     EMGALITY 120 MG/ML SOAJ INJECT 120 MILLIGRAMS INTO THE SKIN EVERY 28 DAYS 1 mL 4   ezetimibe (ZETIA) 10 MG tablet TAKE ONE (1) TABLET BY MOUTH EVERY DAY 90 tablet 1   gabapentin (NEURONTIN) 100 MG capsule Take 1 capsule (100 mg total) by mouth at bedtime. 30 capsule 5   methocarbamol (ROBAXIN) 500 MG tablet TAKE ONE (1) TABLET BY MOUTH 3 TIMES DAILY AS NEEDED FOR MUSCLE SPASMS 90 tablet 1   Romosozumab-aqqg (EVENITY) 105 MG/1.17ML SOSY injection Inject 210 mg into the skin every 30 (thirty) days.     sertraline (ZOLOFT) 100 MG tablet TAKE TWO TABLETS BY MOUTH DAILY 180 tablet 1   Travoprost, BAK Free, (TRAVATAN) 0.004 % SOLN ophthalmic solution Place 1 drop into the right eye at bedtime.  zolpidem (AMBIEN) 5 MG tablet Take 1 tablet (5 mg total) by mouth at bedtime as needed for sleep. 30 tablet 1   No current facility-administered medications for this visit.    Review of Systems Review of Systems  Constitutional: Negative.   Gastrointestinal: Negative.   Genitourinary:  Negative for difficulty urinating, menstrual problem, vaginal bleeding and vaginal discharge.  Musculoskeletal:  Positive for arthralgias.   Blood pressure 115/62, pulse 88, height '5\' 5"'$  (1.651 m), weight 165 lb (74.8 kg).  Physical Exam Physical Exam Vitals and nursing note reviewed. Exam conducted with a chaperone present.  Constitutional:      Appearance: Normal appearance.  Genitourinary:    General: Normal vulva.     Exam position: Lithotomy position.     Cervix: Normal.     Comments: Vaginal atrophy no erythema or discharge.  Skin:    General: Skin is warm and dry.  Neurological:     Mental Status: She is alert.  Psychiatric:        Mood and Affect: Mood normal.         Behavior: Behavior normal.    Data Reviewed Office notes  Assessment Vaginal discharge - Plan: Cervicovaginal ancillary only( [place]) Her sx may have been due to atrophy but exam today is benign  Plan No orders of the defined types were placed in this encounter. F/u on result, may return if her sx recur     Emeterio Reeve 10/13/2021, 9:48 AM

## 2021-10-14 ENCOUNTER — Ambulatory Visit: Payer: Medicare PPO | Admitting: Family Medicine

## 2021-10-14 LAB — CERVICOVAGINAL ANCILLARY ONLY
Bacterial Vaginitis (gardnerella): NEGATIVE
Candida Glabrata: NEGATIVE
Candida Vaginitis: NEGATIVE
Comment: NEGATIVE
Comment: NEGATIVE
Comment: NEGATIVE

## 2021-10-17 ENCOUNTER — Encounter: Payer: Self-pay | Admitting: Family Medicine

## 2021-10-17 ENCOUNTER — Ambulatory Visit: Payer: Self-pay

## 2021-10-17 ENCOUNTER — Ambulatory Visit: Payer: Medicare PPO | Admitting: Family Medicine

## 2021-10-17 VITALS — BP 120/70 | Ht 65.0 in | Wt 165.0 lb

## 2021-10-17 DIAGNOSIS — M8000XG Age-related osteoporosis with current pathological fracture, unspecified site, subsequent encounter for fracture with delayed healing: Secondary | ICD-10-CM | POA: Diagnosis not present

## 2021-10-17 DIAGNOSIS — M1712 Unilateral primary osteoarthritis, left knee: Secondary | ICD-10-CM

## 2021-10-17 MED ORDER — KETOROLAC TROMETHAMINE 30 MG/ML IJ SOLN
30.0000 mg | Freq: Once | INTRAMUSCULAR | Status: AC
  Administered 2021-10-17: 30 mg via INTRAMUSCULAR

## 2021-10-17 NOTE — Progress Notes (Signed)
  Cynthia [name] - 76 y.o. female MRN [id number]  Date of birth: 06-16-1945  SUBJECTIVE:  Including CC & ROS.  No chief complaint on file.   Cynthia [name] is a 76 y.o. female that is presenting with acute left knee pain and following up for her osteoporosis.  She will complete her Evenity today.  Her knee pain has acutely worsened.  Having swelling in the knee.    Review of Systems See HPI   HISTORY: Past Medical, Surgical, Social, and Family History Reviewed & Updated per EMR.   Pertinent Historical Findings include:  Past Medical History:  Diagnosis Date   Allergy    Anxiety    Arthritis    Blood transfusion without reported diagnosis    Constipation, chronic    DEGENERATIVE JOINT DISEASE 06/02/2010   Depression    Glaucoma 05/15/2012   Right eye   Headache(784.0)    Hyperlipidemia    hx of   Osteopenia    Psoriasis    PUD (peptic ulcer disease) 05/15/2010   Upper GI bleed d/t NSAIDs   RLS (restless legs syndrome)    robaxin prn   TIA (transient ischemic attack) 09/2019    Past Surgical History:  Procedure Laterality Date   ANKLE FRACTURE SURGERY Left    surgery 12-11 (LEFT)   CESAREAN SECTION  05/16/1967   COLONOSCOPY  12/17/2013     PHYSICAL EXAM:  VS: BP 120/70 (BP Location: Left Arm, Patient Position: Sitting)   Ht '5\' 5"'$  (1.651 m)   Wt 165 lb (74.8 kg)   BMI 27.46 kg/m  Physical Exam Gen: NAD, alert, cooperative with exam, well-appearing MSK:  Neurovascularly intact     Aspiration/Injection Procedure Note Cynthia [name] 06-20-1945  Procedure: Aspiration and Injection Indications: Left knee pain  Procedure Details Consent: Risks of procedure as well as the alternatives and risks of each were explained to the (patient/caregiver).  Consent for procedure obtained. Time Out: Verified patient identification, verified procedure, site/side was marked, verified correct patient position, special equipment/implants available,  medications/allergies/relevent history reviewed, required imaging and test results available.  Performed.  The area was cleaned with iodine and alcohol swabs.    The left knee superior lateral suprapatellar pouch was injected using 3 cc of 1% lidocaine on a 25-gauge 1-1/2 inch needle.  An 18-gauge 1 and 1 officiated was used to achieve aspiration.  The syringe was switched and a mixture containing 1 cc's of 30 mg Toradol and 4 cc's of 0.5% bupivacaine was injected.  Ultrasound was used. Images were obtained in long views showing the injection.    Amount of Fluid Aspirated:  65m Character of Fluid: clear and straw colored Fluid was sent for: n/a  A sterile dressing was applied.  Patient did tolerate procedure well.     ASSESSMENT & PLAN:   OA (osteoarthritis) of knee Acute on chronic in nature.  She is wanting to put off arthroplasty as long as she can. -Counseled on home exercise therapy and supportive care. -Intra-articular Toradol injection today. -Pursue Zilretta injection.  Osteoporosis Completed her Evenity today. -Pursue Prolia. -Get updated bone density in November.

## 2021-10-17 NOTE — Assessment & Plan Note (Signed)
Acute on chronic in nature.  She is wanting to put off arthroplasty as long as she can. -Counseled on home exercise therapy and supportive care. -Intra-articular Toradol injection today. -Pursue Zilretta injection.

## 2021-10-17 NOTE — Assessment & Plan Note (Signed)
Completed her Evenity today. -Pursue Prolia. -Get updated bone density in November.

## 2021-10-17 NOTE — Patient Instructions (Signed)
Good to see you Please use ice as needed  Please send me a message in MyChart with any questions or updates.  We'll call when the zilretta injection is ready.   --Dr. Raeford Razor

## 2021-10-18 NOTE — Telephone Encounter (Signed)
Prolia VOB initiated via [email or web address]  New start Transition from Thermal to Prolia

## 2021-10-25 ENCOUNTER — Telehealth: Payer: Self-pay | Admitting: Family Medicine

## 2021-10-26 NOTE — Telephone Encounter (Signed)
Prior auth required for PROLIA ? ?PA PROCESS DETAILS: PA is required. PA can be initiated by calling [phone] or online at ?[email or web address] ? ?

## 2021-10-26 NOTE — Telephone Encounter (Signed)
Prior Authorization initiated for [facility] via [email or web address] KEY: [id number]

## 2021-10-27 NOTE — Telephone Encounter (Signed)
Left knee Zilretta PA is required. I faxed clinical info to [phone].

## 2021-10-27 NOTE — Telephone Encounter (Signed)
Received fax from [facility]. Prolia is approved 09/01/20- 05/14/22.   Approved EOC #: [id number]

## 2021-10-31 ENCOUNTER — Ambulatory Visit: Payer: Medicare PPO | Admitting: Family Medicine

## 2021-10-31 ENCOUNTER — Ambulatory Visit: Payer: Self-pay

## 2021-10-31 VITALS — BP 120/78 | Ht 65.0 in | Wt 163.0 lb

## 2021-10-31 DIAGNOSIS — M1712 Unilateral primary osteoarthritis, left knee: Secondary | ICD-10-CM | POA: Diagnosis not present

## 2021-10-31 NOTE — Telephone Encounter (Signed)
Key: [id number] - PA Case ID: [id number] Valid: 09/01/20-05/14/22

## 2021-10-31 NOTE — Patient Instructions (Signed)
Good to see you Please use ice as needed  I have made a referral to the surgeon   Please send me a message in [place] with any questions or updates.  Please see me back in 6-8 weeks.   --Dr. Raeford Razor

## 2021-10-31 NOTE — Telephone Encounter (Signed)
Rec'd fax from [facility]. Cynthia [name] is authorized 10/28/21 to 11/27/21. Auth #: [id number]  Patient has a $35 copay.

## 2021-10-31 NOTE — Progress Notes (Signed)
  Cynthia [name] - 76 y.o. female MRN [id number]  Date of birth: 1945/09/20  SUBJECTIVE:  Including CC & ROS.  No chief complaint on file.   Cynthia [name] is a 76 y.o. female that is here for acute worsening of her left knee pain. Similar to her previous pain. No injury.    Review of Systems See HPI   HISTORY: Past Medical, Surgical, Social, and Family History Reviewed & Updated per EMR.   Pertinent Historical Findings include:  Past Medical History:  Diagnosis Date   Allergy    Anxiety    Arthritis    Blood transfusion without reported diagnosis    Constipation, chronic    DEGENERATIVE JOINT DISEASE 06/02/2010   Depression    Glaucoma 05/15/2012   Right eye   Headache(784.0)    Hyperlipidemia    hx of   Osteopenia    Psoriasis    PUD (peptic ulcer disease) 05/15/2010   Upper GI bleed d/t NSAIDs   RLS (restless legs syndrome)    robaxin prn   TIA (transient ischemic attack) 09/2019    Past Surgical History:  Procedure Laterality Date   ANKLE FRACTURE SURGERY Left    surgery 12-11 (LEFT)   CESAREAN SECTION  05/16/1967   COLONOSCOPY  12/17/2013     PHYSICAL EXAM:  VS: BP 120/78   Ht '5\' 5"'$  (1.651 m)   Wt 163 lb (73.9 kg)   BMI 27.12 kg/m  Physical Exam Gen: NAD, alert, cooperative with exam, well-appearing MSK:  Neurovascularly intact     Aspiration/Injection Procedure Note Cynthia [name] 1945-06-27  Procedure: Aspiration and Injection Indications: left knee pain  Procedure Details Consent: Risks of procedure as well as the alternatives and risks of each were explained to the (patient/caregiver).  Consent for procedure obtained. Time Out: Verified patient identification, verified procedure, site/side was marked, verified correct patient position, special equipment/implants available, medications/allergies/relevent history reviewed, required imaging and test results available.  Performed.  The area was cleaned with iodine and alcohol swabs.    The  left knee superior lateral suprapatellar pouch was injected using 3 cc of 1% lidocaine on a 25-gauge 1-1/2 inch needle.  An 18-gauge 1-1/2 needle was used to achieve aspiration.  The syringe was switched and a mixture containing 5 cc's of 32 mg Zilretta and 4 cc's of 0.25% bupivacaine was injected.  Ultrasound was used. Images were obtained in long views showing the injection.   Amount of Fluid Aspirated:  73m Character of Fluid: clear and straw colored Fluid was sent for: n/a  A sterile dressing was applied.  Patient did tolerate procedure well.      ASSESSMENT & PLAN:   OA (osteoarthritis) of knee Acutely occurring. Exacerbation of her underlying degenerative changes.  - counseled on home exercise therapy and supportive care. -Zilretta injection today. -Referral to surgeon.

## 2021-10-31 NOTE — Assessment & Plan Note (Signed)
Acutely occurring. Exacerbation of her underlying degenerative changes.  - counseled on home exercise therapy and supportive care. -Zilretta injection today. -Referral to surgeon.

## 2021-10-31 NOTE — Telephone Encounter (Signed)
Pt ready for scheduling on or after 11/17/21  Out-of-pocket cost due at time of visit: $35  Primary: [employer] - [place] Prolia co-insurance: 0% Admin fee co-insurance: $35  Secondary: n/a Prolia co-insurance:  Admin fee co-insurance:   Deductible: does not apply  Prior Auth: APPROVED PA# [id number] Valid: 09/01/20-05/14/22    ** This summary of benefits is an estimation of the patient's out-of-pocket cost. Exact cost may very based on individual plan coverage.

## 2021-11-10 NOTE — Telephone Encounter (Signed)
Pt informed of below.  She will get her Prolia injection at her 12/26/21 visit with Dr. Raeford Razor.

## 2021-11-19 DIAGNOSIS — M25562 Pain in left knee: Secondary | ICD-10-CM | POA: Diagnosis not present

## 2021-11-19 DIAGNOSIS — M1712 Unilateral primary osteoarthritis, left knee: Secondary | ICD-10-CM | POA: Diagnosis not present

## 2021-11-28 ENCOUNTER — Encounter: Payer: Self-pay | Admitting: Family Medicine

## 2021-11-28 ENCOUNTER — Encounter: Payer: Self-pay | Admitting: Neurology

## 2021-11-28 ENCOUNTER — Encounter: Payer: Self-pay | Admitting: Internal Medicine

## 2021-11-29 ENCOUNTER — Telehealth: Payer: Self-pay

## 2021-11-29 NOTE — Telephone Encounter (Signed)
Received surgical clearance form from [facility]. Pt is scheduled for L total TKA on 12/28/21 with Dr. Berenice Primas. They are also requesting clearance from neurology. Please advise if appt is needed. Thank you.

## 2021-11-30 ENCOUNTER — Other Ambulatory Visit: Payer: Self-pay | Admitting: Internal Medicine

## 2021-11-30 NOTE — Telephone Encounter (Signed)
Lvm to advise patient she needs follow up for surgical clearance. Please schedule at her convenience.

## 2021-11-30 NOTE — Telephone Encounter (Signed)
Advise patient, recommend office visit to clear her for surgery.  Please schedule

## 2021-11-30 NOTE — Telephone Encounter (Signed)
Pt returned call and scheduled for appt

## 2021-12-01 ENCOUNTER — Encounter: Payer: Self-pay | Admitting: Internal Medicine

## 2021-12-01 ENCOUNTER — Ambulatory Visit: Payer: Medicare PPO | Admitting: Internal Medicine

## 2021-12-01 VITALS — BP 130/68 | HR 82 | Temp 98.1°F | Resp 16 | Ht 65.0 in | Wt 166.0 lb

## 2021-12-01 DIAGNOSIS — M1712 Unilateral primary osteoarthritis, left knee: Secondary | ICD-10-CM

## 2021-12-01 DIAGNOSIS — Z01818 Encounter for other preprocedural examination: Secondary | ICD-10-CM | POA: Diagnosis not present

## 2021-12-01 NOTE — Progress Notes (Signed)
Subjective:    Patient ID: Cynthia [name], female    DOB: June 21, 1945, 76 y.o.   MRN: [id number]  DOS:  12/01/2021 Type of visit - description: Routine visit  Main concern is to be cleared for surgery  Review of Systems In general doing well except for knee pain. She is able to do all her ADLs, the only limiting factor for physical activities knee pain Denies any recent chest pain, difficulty breathing.  No palpitations.  No orthopnea.  No lower extremity edema.   Past Medical History:  Diagnosis Date   Allergy    Anxiety    Arthritis    Blood transfusion without reported diagnosis    Constipation, chronic    DEGENERATIVE JOINT DISEASE 06/02/2010   Depression    Glaucoma 05/15/2012   Right eye   Headache(784.0)    Hyperlipidemia    hx of   Osteopenia    Psoriasis    PUD (peptic ulcer disease) 05/15/2010   Upper GI bleed d/t NSAIDs   RLS (restless legs syndrome)    robaxin prn   TIA (transient ischemic attack) 09/2019    Past Surgical History:  Procedure Laterality Date   ANKLE FRACTURE SURGERY Left    surgery 12-11 (LEFT)   CESAREAN SECTION  05/16/1967   COLONOSCOPY  12/17/2013    Current Outpatient Medications  Medication Instructions   aspirin EC 81 mg, Oral, Daily, Swallow whole.   buPROPion (WELLBUTRIN XL) 300 MG 24 hr tablet TAKE ONE (1) TABLET BY MOUTH EVERY DAY   cetirizine (ZYRTEC) 10 mg, Oral, At bedtime PRN,     EMGALITY 120 MG/ML SOAJ INJECT 120 MILLIGRAMS INTO THE SKIN EVERY 28 DAYS   ezetimibe (ZETIA) 10 MG tablet TAKE ONE (1) TABLET BY MOUTH EVERY DAY   gabapentin (NEURONTIN) 100 mg, Oral, Daily at bedtime   methocarbamol (ROBAXIN) 500 MG tablet TAKE ONE (1) TABLET BY MOUTH 3 TIMES DAILY AS NEEDED FOR MUSCLE SPASMS   sertraline (ZOLOFT) 100 MG tablet TAKE TWO TABLETS BY MOUTH DAILY   Travoprost, BAK Free, (TRAVATAN) 0.004 % SOLN ophthalmic solution 1 drop, Right Eye, Daily at bedtime   zolpidem (AMBIEN) 5 mg, Oral, At bedtime PRN        Objective:   Physical Exam BP 130/68   Pulse 82   Temp 98.1 F (36.7 C) (Oral)   Resp 16   Ht '5\' 5"'$  (1.651 m)   Wt 166 lb (75.3 kg)   SpO2 97%   BMI 27.62 kg/m  General:   Well developed, NAD, BMI noted. HEENT:  Normocephalic . Face symmetric, atraumatic Neck: No JVD at 45 degrees Lungs:  CTA B Normal respiratory effort, no intercostal retractions, no accessory muscle use. Heart: RRR,  no murmur.  Lower extremities: no pretibial edema bilaterally  Skin: Not pale. Not jaundice Neurologic:  alert & oriented X3.  Speech normal, gait appropriate for age and unassisted Psych--  Cognition and judgment appear intact.  Cooperative with normal attention span and concentration.  Behavior appropriate. No anxious or depressed appearing.      Assessment     Assessment Hyperlipidemia: Lipitor causes myalgias, declined other statins Anxiety depression insomnia DJD Migraines, chronic: Saw neurology 12/01/2020: Failed verapamil, nortriptyline and topiramate. Rx Emgality.  Tramadol prn Osteoporosis: 07-2014 T score of -1.7, T score (November 2021) -2.8: sports medicine Rx Prolia PUD due to NSAIDs RLS - robaxin  prn Neuropathy: dx sports meds  Glaucoma Chronic constipation Skin psoriasis , Dx LSC (lichen )  ~ 4098 ,  sees derm   PLAN: DJD: After she saw orthopedics, they are planning left TKR for August. She has no cardiopulmonary symptoms. EKG today: NSR, unchanged from previous. History of TIA, they have requested neurology clearance. Plan: Okay to proceed with surgery.  Paperwork signed.  She will need preop labs. Preop evaluation: As above RTC November 2023

## 2021-12-01 NOTE — Patient Instructions (Addendum)
You are cleared for your surgery.      GO TO THE FRONT DESK, PLEASE SCHEDULE YOUR APPOINTMENTS You have an appointment with me in September, okay to reschedule for November

## 2021-12-02 NOTE — Assessment & Plan Note (Signed)
DJD: After she saw orthopedics, they are planning left TKR for August. She has no cardiopulmonary symptoms. EKG today: NSR, unchanged from previous. History of TIA, they have requested neurology clearance. Plan: Okay to proceed with surgery.  Paperwork signed.  She will need preop labs. Preop evaluation: As above RTC November 2023

## 2021-12-02 NOTE — Telephone Encounter (Signed)
Received fax confirmation

## 2021-12-02 NOTE — Telephone Encounter (Signed)
Pt cleared for surgery, OV note and EKG from 12/02/21 and surgical clearance form faxed back to [facility] at [phone]. Form sent for scanning.

## 2021-12-12 DIAGNOSIS — M25562 Pain in left knee: Secondary | ICD-10-CM | POA: Diagnosis not present

## 2021-12-12 DIAGNOSIS — M1712 Unilateral primary osteoarthritis, left knee: Secondary | ICD-10-CM | POA: Diagnosis not present

## 2021-12-13 NOTE — Progress Notes (Unsigned)
NEUROLOGY FOLLOW UP OFFICE NOTE  Cynthia [name] [id number]  Assessment/Plan:   1.  Cervicogenic headache 2.  Otalgia - unclear etiology, query TMJ dysfunction 3.  Migraine without aura, without status migrainosus, not intractable - improved on Emgality 4.  Transient left hemisensory loss.  TIA vs migraine.  As no prior history, must assume TIA. 5.  Small Acomm aneurysm   1.  To treat cervicogenic pain/otalgia, start gabapentin '100mg'$  at bedtime.  We can increase dose in 2 weeks if needed 2.   Emgality every 28 days for migraine prevention 3.  Tramadol as needed.  Limit use of pain relievers to no more than 2 days out of week to prevent risk of rebound or medication-overuse headache. 4.  Keep headache diary 5.  Follow up 6 months.   Subjective:  Cynthia [name] is a 76 year old right-handed woman with depression, glaucoma, degenerative joint disease and history of TIA who follows up for headaches   UPDATE: To treat neck and posterior headache, started gabapentin in January.  ***  Migraines are controlled. Intensity:  moderate Duration:  3 to 4 hours.  Frequency:  Last migraine 2 months ago      Current NSAIDS:  ASA '81mg'$  daily Current analgesics:  tramadol; acetaminophen-caffeine Current triptans:  none Current ergotamine:  no Current anti-emetic: Zofran Current muscle relaxants:  Robaxin '500mg'$  TID Current anti-anxiolytic:  no Current sleep aide:  Ambien Current Antihypertensive medications:  verapamil CR '180mg'$  Current Antidepressant medications:  Sertraline '100mg'$ , bupropion '300mg'$  Current Anticonvulsant medications:  gabapentin '100mg'$  at bedtime Current anti-CGRP:  Emgality Current Vitamins/Herbal/Supplements:  B complex, Mg Current Antihistamines/Decongestants:  Mucinex, Astelin Other therapy: 1 cup black coffee with melatonin '10mg'$  at bedtime Other medication: Zetia   No subsequent stroke-like symptoms.   Depression and anxiety: Yes Other pain: Joint pain    HISTORY:  She started having headaches infrequently in 2015 but progressively became more frequent in 2016.  Varies, but they are usually left sided, involving the ear, but also may be right sided (involving the sinuses) or in band-like distribution.  It is both throbbing and non-throbbing.  Initially, it is usually 5/10 but severe episodes are 7-8/10.  Sometimes there is nausea.  There is phonophobia.  There is no associated photophobia, visual disturbance or unilateral numbness or weakness.  Initially, it lasts a couple of hours and occuring 15 days out of the month (3 to 4 days severe).  It wakes her up at 4:30 to 5 AM.  She reports sensation of congestion but no runny nose.  CT of sinuses showed opacified right sphenoid sinus and ethmoid air cell.  She was treated a couple of times with Augmentin and prednisone, which was effective for a while but headaches then returned.  She cannot think of a trigger.  They are worse during the summer.  There are no aggravating or relieving factors.  MRI of brain from 06/10/15 was normal.  Sed Rate from 12/27/15 was 10.     She was admitted to [facility] on 09/14/2019 for TIA presenting as transient left sided hemibody numbness (first leg, then arm, then face).  Mostly resolved quickly but lingered for several hours.  CT head showed right internal capsule hypodensity but follow up MRI of brain showed no acute infarct.  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  2D echo showed EF 60-65% with no source of embolus.  LDL was 162 and Hgb A1c was 5.2.  She was started on ASA  $'81mg'A$  daily and Plavix '75mg'$  for 3 weeks followed by ASA alone.  The next morning she had a habitual migraine headache except it lasted all day.  She was also started on Zetia (she has statin intolerance).  Unclear if event was TIA or complicated migraine.   Due to worsening headache, ordered an MRA of head .  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   She notes another headache across the back of her head when she lays down.  She does have neck pain with cervical spondylosis.  Around December 2022, she started experiencing severe paroxysmal ear pain, mostly right ear but also left ear, every other day.  No aural fullness.  No change in tinnitus.  Hearing slightly worse but not significant.  Sometimes jaw pain.     Past NSAIDS:  ibuprofen (ulcers) Past analgesics:  Tylenol Past abortive triptans:  sumatriptan (advised to discontinue due to age) Past abortive ergotamine:  no Past muscle relaxants:  no Past anti-emetic:  no Past antihypertensive medications:  no Past antidepressant medications:  Nortriptyline '50mg'$  Past anticonvulsant medications:  topiramate '25mg'$  (concerned about glaucoma) Past anti-CGRP:  no Past vitamins/Herbal/Supplements:  no Past antihistamines/decongestants:  no Other past therapies:  none  PAST MEDICAL HISTORY: Past Medical History:  Diagnosis Date   Allergy    Anxiety    Arthritis    Blood transfusion without reported diagnosis    Constipation, chronic    DEGENERATIVE JOINT DISEASE 06/02/2010   Depression    Glaucoma 05/15/2012   Right eye   Headache(784.0)    Hyperlipidemia    hx of   Osteopenia    Psoriasis    PUD (peptic ulcer disease) 05/15/2010   Upper GI bleed d/t NSAIDs   RLS (restless legs syndrome)    robaxin prn   TIA (transient ischemic attack) 09/2019    MEDICATIONS: Current Outpatient Medications on File Prior to Visit  Medication Sig Dispense Refill   aspirin EC 81 MG tablet Take 81 mg by mouth daily. Swallow whole.     buPROPion (WELLBUTRIN XL) 300 MG 24 hr tablet TAKE ONE (1) TABLET BY MOUTH EVERY DAY 90 tablet 1   cetirizine (ZYRTEC) 10 MG tablet Take 10 mg by mouth at bedtime as needed for allergies.     EMGALITY 120 MG/ML SOAJ INJECT 120 MILLIGRAMS INTO THE SKIN EVERY 28 DAYS 1 mL 4   ezetimibe (ZETIA) 10 MG tablet TAKE ONE (1) TABLET BY  MOUTH EVERY DAY 90 tablet 1   gabapentin (NEURONTIN) 100 MG capsule Take 1 capsule (100 mg total) by mouth at bedtime. 30 capsule 5   methocarbamol (ROBAXIN) 500 MG tablet TAKE ONE (1) TABLET BY MOUTH 3 TIMES DAILY AS NEEDED FOR MUSCLE SPASMS 90 tablet 1   sertraline (ZOLOFT) 100 MG tablet TAKE TWO TABLETS BY MOUTH DAILY 180 tablet 1   Travoprost, BAK Free, (TRAVATAN) 0.004 % SOLN ophthalmic solution Place 1 drop into the right eye at bedtime.     zolpidem (AMBIEN) 5 MG tablet Take 1 tablet (5 mg total) by mouth at bedtime as needed for sleep. 30 tablet 1   No current facility-administered medications on file prior to visit.    ALLERGIES: Allergies  Allergen Reactions   Bee Pollen Other (See Comments)   Motrin [Ibuprofen]     GI bleed   Pollen Extract    Statins     "delibitating pain all over body"   Sulfonamide  Derivatives     Pt unsure of reaction   Ciprofloxacin Swelling and Rash    FAMILY HISTORY: Family History  Problem Relation Age of Onset   Aneurysm Mother        brain   Celiac disease Brother    Diabetes Maternal Grandfather    Hypertension Neg Hx    Breast cancer Neg Hx    Coronary artery disease Neg Hx    Colon cancer Neg Hx    Esophageal cancer Neg Hx    Stomach cancer Neg Hx    Rectal cancer Neg Hx       Objective:  *** General: No acute distress.  Patient appears ***-groomed.   Head:  Normocephalic/atraumatic Eyes:  Fundi examined but not visualized Neck: supple, no paraspinal tenderness, full range of motion Heart:  Regular rate and rhythm Lungs:  Clear to auscultation bilaterally Back: No paraspinal tenderness Neurological Exam: alert and oriented to person, place, and time.  Speech fluent and not dysarthric, language intact.  CN II-XII intact. Bulk and tone normal, muscle strength 5/5 throughout.  Sensation to light touch intact.  Deep tendon reflexes 2+ throughout, toes downgoing.  Finger to nose testing intact.  Gait normal, Romberg  negative.   Metta Clines, DO  CC: ***

## 2021-12-14 ENCOUNTER — Ambulatory Visit: Payer: Medicare PPO | Admitting: Neurology

## 2021-12-14 ENCOUNTER — Encounter: Payer: Self-pay | Admitting: Neurology

## 2021-12-14 VITALS — BP 138/71 | HR 80 | Ht 64.0 in | Wt 166.4 lb

## 2021-12-14 DIAGNOSIS — I671 Cerebral aneurysm, nonruptured: Secondary | ICD-10-CM

## 2021-12-14 DIAGNOSIS — G4486 Cervicogenic headache: Secondary | ICD-10-CM | POA: Diagnosis not present

## 2021-12-14 DIAGNOSIS — G43009 Migraine without aura, not intractable, without status migrainosus: Secondary | ICD-10-CM

## 2021-12-14 NOTE — Progress Notes (Signed)
Fax received from [facility]:  Surgical clearance needed.   Clearance approved and faxed.

## 2021-12-14 NOTE — Patient Instructions (Signed)
Continue gabapentin May take methocarbamol for neck pain if needed Will look into restarting emgality Follow up 6 months.

## 2021-12-19 DIAGNOSIS — M25662 Stiffness of left knee, not elsewhere classified: Secondary | ICD-10-CM | POA: Diagnosis not present

## 2021-12-19 DIAGNOSIS — M1732 Unilateral post-traumatic osteoarthritis, left knee: Secondary | ICD-10-CM | POA: Diagnosis not present

## 2021-12-26 ENCOUNTER — Ambulatory Visit: Payer: Medicare PPO | Admitting: Family Medicine

## 2021-12-28 DIAGNOSIS — Z96652 Presence of left artificial knee joint: Secondary | ICD-10-CM | POA: Diagnosis not present

## 2021-12-28 DIAGNOSIS — G8918 Other acute postprocedural pain: Secondary | ICD-10-CM | POA: Diagnosis not present

## 2021-12-28 DIAGNOSIS — M1712 Unilateral primary osteoarthritis, left knee: Secondary | ICD-10-CM | POA: Diagnosis not present

## 2021-12-28 HISTORY — PX: TOTAL KNEE ARTHROPLASTY: SHX125

## 2021-12-30 DIAGNOSIS — R2689 Other abnormalities of gait and mobility: Secondary | ICD-10-CM | POA: Diagnosis not present

## 2021-12-30 DIAGNOSIS — Z4789 Encounter for other orthopedic aftercare: Secondary | ICD-10-CM | POA: Diagnosis not present

## 2021-12-30 DIAGNOSIS — M6281 Muscle weakness (generalized): Secondary | ICD-10-CM | POA: Diagnosis not present

## 2021-12-30 DIAGNOSIS — R2681 Unsteadiness on feet: Secondary | ICD-10-CM | POA: Diagnosis not present

## 2021-12-30 DIAGNOSIS — Z96652 Presence of left artificial knee joint: Secondary | ICD-10-CM | POA: Diagnosis not present

## 2021-12-30 NOTE — Telephone Encounter (Signed)
I called pt- she just had TKR on 12/28/21. She wants to hold off on Prolia for now. She will call us back to schedule if/when she decides to start Prolia.

## 2021-12-31 DIAGNOSIS — M6281 Muscle weakness (generalized): Secondary | ICD-10-CM | POA: Diagnosis not present

## 2021-12-31 DIAGNOSIS — R2681 Unsteadiness on feet: Secondary | ICD-10-CM | POA: Diagnosis not present

## 2021-12-31 DIAGNOSIS — Z96652 Presence of left artificial knee joint: Secondary | ICD-10-CM | POA: Diagnosis not present

## 2021-12-31 DIAGNOSIS — Z4789 Encounter for other orthopedic aftercare: Secondary | ICD-10-CM | POA: Diagnosis not present

## 2021-12-31 DIAGNOSIS — R2689 Other abnormalities of gait and mobility: Secondary | ICD-10-CM | POA: Diagnosis not present

## 2022-01-02 DIAGNOSIS — R2681 Unsteadiness on feet: Secondary | ICD-10-CM | POA: Diagnosis not present

## 2022-01-02 DIAGNOSIS — M1712 Unilateral primary osteoarthritis, left knee: Secondary | ICD-10-CM | POA: Diagnosis not present

## 2022-01-02 DIAGNOSIS — Z471 Aftercare following joint replacement surgery: Secondary | ICD-10-CM | POA: Diagnosis not present

## 2022-01-02 DIAGNOSIS — G4700 Insomnia, unspecified: Secondary | ICD-10-CM | POA: Diagnosis not present

## 2022-01-02 DIAGNOSIS — Z4789 Encounter for other orthopedic aftercare: Secondary | ICD-10-CM | POA: Diagnosis not present

## 2022-01-02 DIAGNOSIS — Z96652 Presence of left artificial knee joint: Secondary | ICD-10-CM | POA: Diagnosis not present

## 2022-01-02 DIAGNOSIS — E785 Hyperlipidemia, unspecified: Secondary | ICD-10-CM | POA: Diagnosis not present

## 2022-01-02 DIAGNOSIS — R2689 Other abnormalities of gait and mobility: Secondary | ICD-10-CM | POA: Diagnosis not present

## 2022-01-02 DIAGNOSIS — M6281 Muscle weakness (generalized): Secondary | ICD-10-CM | POA: Diagnosis not present

## 2022-01-02 DIAGNOSIS — F339 Major depressive disorder, recurrent, unspecified: Secondary | ICD-10-CM | POA: Diagnosis not present

## 2022-01-03 DIAGNOSIS — R2681 Unsteadiness on feet: Secondary | ICD-10-CM | POA: Diagnosis not present

## 2022-01-03 DIAGNOSIS — R2689 Other abnormalities of gait and mobility: Secondary | ICD-10-CM | POA: Diagnosis not present

## 2022-01-03 DIAGNOSIS — Z96652 Presence of left artificial knee joint: Secondary | ICD-10-CM | POA: Diagnosis not present

## 2022-01-03 DIAGNOSIS — M6281 Muscle weakness (generalized): Secondary | ICD-10-CM | POA: Diagnosis not present

## 2022-01-03 DIAGNOSIS — Z4789 Encounter for other orthopedic aftercare: Secondary | ICD-10-CM | POA: Diagnosis not present

## 2022-01-04 DIAGNOSIS — Z96652 Presence of left artificial knee joint: Secondary | ICD-10-CM | POA: Diagnosis not present

## 2022-01-04 DIAGNOSIS — R2689 Other abnormalities of gait and mobility: Secondary | ICD-10-CM | POA: Diagnosis not present

## 2022-01-04 DIAGNOSIS — Z4789 Encounter for other orthopedic aftercare: Secondary | ICD-10-CM | POA: Diagnosis not present

## 2022-01-04 DIAGNOSIS — M6281 Muscle weakness (generalized): Secondary | ICD-10-CM | POA: Diagnosis not present

## 2022-01-04 DIAGNOSIS — R2681 Unsteadiness on feet: Secondary | ICD-10-CM | POA: Diagnosis not present

## 2022-01-05 DIAGNOSIS — Z4789 Encounter for other orthopedic aftercare: Secondary | ICD-10-CM | POA: Diagnosis not present

## 2022-01-05 DIAGNOSIS — R2681 Unsteadiness on feet: Secondary | ICD-10-CM | POA: Diagnosis not present

## 2022-01-05 DIAGNOSIS — M6281 Muscle weakness (generalized): Secondary | ICD-10-CM | POA: Diagnosis not present

## 2022-01-05 DIAGNOSIS — R2689 Other abnormalities of gait and mobility: Secondary | ICD-10-CM | POA: Diagnosis not present

## 2022-01-05 DIAGNOSIS — Z96652 Presence of left artificial knee joint: Secondary | ICD-10-CM | POA: Diagnosis not present

## 2022-01-09 DIAGNOSIS — Z96652 Presence of left artificial knee joint: Secondary | ICD-10-CM | POA: Diagnosis not present

## 2022-01-09 DIAGNOSIS — R2689 Other abnormalities of gait and mobility: Secondary | ICD-10-CM | POA: Diagnosis not present

## 2022-01-09 DIAGNOSIS — M6281 Muscle weakness (generalized): Secondary | ICD-10-CM | POA: Diagnosis not present

## 2022-01-09 DIAGNOSIS — Z4789 Encounter for other orthopedic aftercare: Secondary | ICD-10-CM | POA: Diagnosis not present

## 2022-01-10 DIAGNOSIS — Z96652 Presence of left artificial knee joint: Secondary | ICD-10-CM | POA: Diagnosis not present

## 2022-01-10 DIAGNOSIS — R2689 Other abnormalities of gait and mobility: Secondary | ICD-10-CM | POA: Diagnosis not present

## 2022-01-10 DIAGNOSIS — Z471 Aftercare following joint replacement surgery: Secondary | ICD-10-CM | POA: Diagnosis not present

## 2022-01-10 DIAGNOSIS — Z4789 Encounter for other orthopedic aftercare: Secondary | ICD-10-CM | POA: Diagnosis not present

## 2022-01-10 DIAGNOSIS — M6281 Muscle weakness (generalized): Secondary | ICD-10-CM | POA: Diagnosis not present

## 2022-01-12 ENCOUNTER — Other Ambulatory Visit: Payer: Self-pay | Admitting: Neurology

## 2022-01-12 DIAGNOSIS — Z4789 Encounter for other orthopedic aftercare: Secondary | ICD-10-CM | POA: Diagnosis not present

## 2022-01-12 DIAGNOSIS — M6281 Muscle weakness (generalized): Secondary | ICD-10-CM | POA: Diagnosis not present

## 2022-01-12 DIAGNOSIS — Z96652 Presence of left artificial knee joint: Secondary | ICD-10-CM | POA: Diagnosis not present

## 2022-01-12 DIAGNOSIS — R2689 Other abnormalities of gait and mobility: Secondary | ICD-10-CM | POA: Diagnosis not present

## 2022-01-17 DIAGNOSIS — Z96652 Presence of left artificial knee joint: Secondary | ICD-10-CM | POA: Diagnosis not present

## 2022-01-17 DIAGNOSIS — R2689 Other abnormalities of gait and mobility: Secondary | ICD-10-CM | POA: Diagnosis not present

## 2022-01-17 DIAGNOSIS — Z4789 Encounter for other orthopedic aftercare: Secondary | ICD-10-CM | POA: Diagnosis not present

## 2022-01-17 DIAGNOSIS — M6281 Muscle weakness (generalized): Secondary | ICD-10-CM | POA: Diagnosis not present

## 2022-01-19 DIAGNOSIS — R2689 Other abnormalities of gait and mobility: Secondary | ICD-10-CM | POA: Diagnosis not present

## 2022-01-19 DIAGNOSIS — Z4789 Encounter for other orthopedic aftercare: Secondary | ICD-10-CM | POA: Diagnosis not present

## 2022-01-19 DIAGNOSIS — Z96652 Presence of left artificial knee joint: Secondary | ICD-10-CM | POA: Diagnosis not present

## 2022-01-19 DIAGNOSIS — M6281 Muscle weakness (generalized): Secondary | ICD-10-CM | POA: Diagnosis not present

## 2022-01-24 ENCOUNTER — Other Ambulatory Visit: Payer: Self-pay | Admitting: Internal Medicine

## 2022-01-24 DIAGNOSIS — M6281 Muscle weakness (generalized): Secondary | ICD-10-CM | POA: Diagnosis not present

## 2022-01-24 DIAGNOSIS — Z96652 Presence of left artificial knee joint: Secondary | ICD-10-CM | POA: Diagnosis not present

## 2022-01-24 DIAGNOSIS — Z4789 Encounter for other orthopedic aftercare: Secondary | ICD-10-CM | POA: Diagnosis not present

## 2022-01-24 DIAGNOSIS — R2689 Other abnormalities of gait and mobility: Secondary | ICD-10-CM | POA: Diagnosis not present

## 2022-01-30 ENCOUNTER — Ambulatory Visit: Payer: Medicare PPO | Admitting: Internal Medicine

## 2022-01-31 DIAGNOSIS — Z4789 Encounter for other orthopedic aftercare: Secondary | ICD-10-CM | POA: Diagnosis not present

## 2022-01-31 DIAGNOSIS — M6281 Muscle weakness (generalized): Secondary | ICD-10-CM | POA: Diagnosis not present

## 2022-01-31 DIAGNOSIS — R2689 Other abnormalities of gait and mobility: Secondary | ICD-10-CM | POA: Diagnosis not present

## 2022-01-31 DIAGNOSIS — Z96652 Presence of left artificial knee joint: Secondary | ICD-10-CM | POA: Diagnosis not present

## 2022-02-07 DIAGNOSIS — M7062 Trochanteric bursitis, left hip: Secondary | ICD-10-CM | POA: Diagnosis not present

## 2022-02-07 DIAGNOSIS — M6281 Muscle weakness (generalized): Secondary | ICD-10-CM | POA: Diagnosis not present

## 2022-02-07 DIAGNOSIS — Z4789 Encounter for other orthopedic aftercare: Secondary | ICD-10-CM | POA: Diagnosis not present

## 2022-02-07 DIAGNOSIS — R2689 Other abnormalities of gait and mobility: Secondary | ICD-10-CM | POA: Diagnosis not present

## 2022-02-07 DIAGNOSIS — Z96652 Presence of left artificial knee joint: Secondary | ICD-10-CM | POA: Diagnosis not present

## 2022-02-14 DIAGNOSIS — R2689 Other abnormalities of gait and mobility: Secondary | ICD-10-CM | POA: Diagnosis not present

## 2022-02-14 DIAGNOSIS — M6281 Muscle weakness (generalized): Secondary | ICD-10-CM | POA: Diagnosis not present

## 2022-02-14 DIAGNOSIS — Z4789 Encounter for other orthopedic aftercare: Secondary | ICD-10-CM | POA: Diagnosis not present

## 2022-02-14 DIAGNOSIS — Z96652 Presence of left artificial knee joint: Secondary | ICD-10-CM | POA: Diagnosis not present

## 2022-02-21 ENCOUNTER — Other Ambulatory Visit: Payer: Self-pay | Admitting: Neurology

## 2022-02-21 DIAGNOSIS — Z4789 Encounter for other orthopedic aftercare: Secondary | ICD-10-CM | POA: Diagnosis not present

## 2022-02-21 DIAGNOSIS — Z96652 Presence of left artificial knee joint: Secondary | ICD-10-CM | POA: Diagnosis not present

## 2022-02-21 DIAGNOSIS — M6281 Muscle weakness (generalized): Secondary | ICD-10-CM | POA: Diagnosis not present

## 2022-02-21 DIAGNOSIS — R2689 Other abnormalities of gait and mobility: Secondary | ICD-10-CM | POA: Diagnosis not present

## 2022-02-23 DIAGNOSIS — M6281 Muscle weakness (generalized): Secondary | ICD-10-CM | POA: Diagnosis not present

## 2022-02-23 DIAGNOSIS — Z96652 Presence of left artificial knee joint: Secondary | ICD-10-CM | POA: Diagnosis not present

## 2022-02-23 DIAGNOSIS — Z4789 Encounter for other orthopedic aftercare: Secondary | ICD-10-CM | POA: Diagnosis not present

## 2022-02-23 DIAGNOSIS — R2689 Other abnormalities of gait and mobility: Secondary | ICD-10-CM | POA: Diagnosis not present

## 2022-02-28 DIAGNOSIS — Z96652 Presence of left artificial knee joint: Secondary | ICD-10-CM | POA: Diagnosis not present

## 2022-02-28 DIAGNOSIS — M6281 Muscle weakness (generalized): Secondary | ICD-10-CM | POA: Diagnosis not present

## 2022-02-28 DIAGNOSIS — R2689 Other abnormalities of gait and mobility: Secondary | ICD-10-CM | POA: Diagnosis not present

## 2022-02-28 DIAGNOSIS — Z4789 Encounter for other orthopedic aftercare: Secondary | ICD-10-CM | POA: Diagnosis not present

## 2022-03-02 DIAGNOSIS — Z4789 Encounter for other orthopedic aftercare: Secondary | ICD-10-CM | POA: Diagnosis not present

## 2022-03-02 DIAGNOSIS — Z96652 Presence of left artificial knee joint: Secondary | ICD-10-CM | POA: Diagnosis not present

## 2022-03-02 DIAGNOSIS — M6281 Muscle weakness (generalized): Secondary | ICD-10-CM | POA: Diagnosis not present

## 2022-03-02 DIAGNOSIS — R2689 Other abnormalities of gait and mobility: Secondary | ICD-10-CM | POA: Diagnosis not present

## 2022-03-07 DIAGNOSIS — M6281 Muscle weakness (generalized): Secondary | ICD-10-CM | POA: Diagnosis not present

## 2022-03-07 DIAGNOSIS — Z4789 Encounter for other orthopedic aftercare: Secondary | ICD-10-CM | POA: Diagnosis not present

## 2022-03-07 DIAGNOSIS — R2689 Other abnormalities of gait and mobility: Secondary | ICD-10-CM | POA: Diagnosis not present

## 2022-03-07 DIAGNOSIS — Z96652 Presence of left artificial knee joint: Secondary | ICD-10-CM | POA: Diagnosis not present

## 2022-03-09 DIAGNOSIS — R2689 Other abnormalities of gait and mobility: Secondary | ICD-10-CM | POA: Diagnosis not present

## 2022-03-09 DIAGNOSIS — M6281 Muscle weakness (generalized): Secondary | ICD-10-CM | POA: Diagnosis not present

## 2022-03-09 DIAGNOSIS — Z4789 Encounter for other orthopedic aftercare: Secondary | ICD-10-CM | POA: Diagnosis not present

## 2022-03-09 DIAGNOSIS — Z96652 Presence of left artificial knee joint: Secondary | ICD-10-CM | POA: Diagnosis not present

## 2022-03-13 DIAGNOSIS — H35363 Drusen (degenerative) of macula, bilateral: Secondary | ICD-10-CM | POA: Diagnosis not present

## 2022-03-13 DIAGNOSIS — H401111 Primary open-angle glaucoma, right eye, mild stage: Secondary | ICD-10-CM | POA: Diagnosis not present

## 2022-03-13 DIAGNOSIS — H3554 Dystrophies primarily involving the retinal pigment epithelium: Secondary | ICD-10-CM | POA: Diagnosis not present

## 2022-03-13 DIAGNOSIS — H353132 Nonexudative age-related macular degeneration, bilateral, intermediate dry stage: Secondary | ICD-10-CM | POA: Diagnosis not present

## 2022-03-14 DIAGNOSIS — Z4789 Encounter for other orthopedic aftercare: Secondary | ICD-10-CM | POA: Diagnosis not present

## 2022-03-14 DIAGNOSIS — M6281 Muscle weakness (generalized): Secondary | ICD-10-CM | POA: Diagnosis not present

## 2022-03-14 DIAGNOSIS — R2689 Other abnormalities of gait and mobility: Secondary | ICD-10-CM | POA: Diagnosis not present

## 2022-03-14 DIAGNOSIS — Z96652 Presence of left artificial knee joint: Secondary | ICD-10-CM | POA: Diagnosis not present

## 2022-03-16 DIAGNOSIS — Z4789 Encounter for other orthopedic aftercare: Secondary | ICD-10-CM | POA: Diagnosis not present

## 2022-03-16 DIAGNOSIS — Z96652 Presence of left artificial knee joint: Secondary | ICD-10-CM | POA: Diagnosis not present

## 2022-03-16 DIAGNOSIS — M6281 Muscle weakness (generalized): Secondary | ICD-10-CM | POA: Diagnosis not present

## 2022-03-16 DIAGNOSIS — R2689 Other abnormalities of gait and mobility: Secondary | ICD-10-CM | POA: Diagnosis not present

## 2022-03-21 DIAGNOSIS — Z96652 Presence of left artificial knee joint: Secondary | ICD-10-CM | POA: Diagnosis not present

## 2022-03-21 DIAGNOSIS — B009 Herpesviral infection, unspecified: Secondary | ICD-10-CM | POA: Diagnosis not present

## 2022-03-21 DIAGNOSIS — L400 Psoriasis vulgaris: Secondary | ICD-10-CM | POA: Diagnosis not present

## 2022-03-21 DIAGNOSIS — Z4789 Encounter for other orthopedic aftercare: Secondary | ICD-10-CM | POA: Diagnosis not present

## 2022-03-21 DIAGNOSIS — R2689 Other abnormalities of gait and mobility: Secondary | ICD-10-CM | POA: Diagnosis not present

## 2022-03-21 DIAGNOSIS — E663 Overweight: Secondary | ICD-10-CM | POA: Diagnosis not present

## 2022-03-21 DIAGNOSIS — L408 Other psoriasis: Secondary | ICD-10-CM | POA: Diagnosis not present

## 2022-03-21 DIAGNOSIS — M6281 Muscle weakness (generalized): Secondary | ICD-10-CM | POA: Diagnosis not present

## 2022-03-23 ENCOUNTER — Encounter: Payer: Self-pay | Admitting: Nurse Practitioner

## 2022-03-23 ENCOUNTER — Ambulatory Visit: Payer: Medicare PPO | Admitting: Nurse Practitioner

## 2022-03-23 VITALS — BP 90/60 | HR 95 | Ht 60.0 in | Wt 165.2 lb

## 2022-03-23 DIAGNOSIS — K219 Gastro-esophageal reflux disease without esophagitis: Secondary | ICD-10-CM | POA: Diagnosis not present

## 2022-03-23 DIAGNOSIS — R1310 Dysphagia, unspecified: Secondary | ICD-10-CM | POA: Diagnosis not present

## 2022-03-23 DIAGNOSIS — R933 Abnormal findings on diagnostic imaging of other parts of digestive tract: Secondary | ICD-10-CM

## 2022-03-23 NOTE — Progress Notes (Signed)
I agree with the assessment and plan as outlined by Ms. Guenther. 

## 2022-03-23 NOTE — Patient Instructions (Signed)
You have been scheduled for an endoscopy. Please follow written instructions given to you at your visit today. If you use inhalers (even only as needed), please bring them with you on the day of your procedure.  _______________________________________________________  If you are age 77 or older, your body mass index should be between 23-30. Your Body mass index is 32.27 kg/m. If this is out of the aforementioned range listed, please consider follow up with your Primary Care Provider.  If you are age 60 or younger, your body mass index should be between 19-25. Your Body mass index is 32.27 kg/m. If this is out of the aformentioned range listed, please consider follow up with your Primary Care Provider.   ________________________________________________________  The [facility] providers would like to encourage you to use [facility] to communicate with providers for non-urgent requests or questions.  Due to long hold times on the telephone, sending your provider a message by [facility] may be a faster and more efficient way to get a response.  Please allow 48 business hours for a response.  Please remember that this is for non-urgent requests.  _______________________________________________________  Due to recent changes in healthcare laws, you may see the results of your imaging and laboratory studies on MyChart before your provider has had a chance to review them.  We understand that in some cases there may be results that are confusing or concerning to you. Not all laboratory results come back in the same time frame and the provider may be waiting for multiple results in order to interpret others.  Please give [place] 48 hours in order for your provider to thoroughly review all the results before contacting the office for clarification of your results.   Thank you for choosing me and [facility].  Tye Savoy NP

## 2022-03-23 NOTE — Progress Notes (Signed)
  Chief Complaint:  swallowing problems   Assessment &  Plan   # 76 yo female with solid food / pill dysphagia. Possibly mild esophagitis and moderately large CP bar resulting in luminal narrowing on esophagram July 2022. Schedule for EGD for possible upper esophageal dilation. The risks and benefits of EGD with possible biopsies / dilation were discussed with the patient who agrees to proceed.  Dr. Jacobs is on leave so procedure will be done by Dr. Dorsey.  Swallowing precautions discussed. Advised to eat slowly, chew food well before swallowing. Drink  liquids in between each bite to avoid food impaction.  # Chronic GERD, frequent heartburn. Opposed to taking a PPI We discussed trial of famotidine. She wants to wait to see what EGD shows prior to starting famotidine. Discussed anti-reflux measures.  #History of recurrent adenomatous colon polyp.  She is up-to-date on surveillance colonoscopy.   HPI   76 yo female with a PMH of of adenomatous colon polyps , chronic constipation , GERD , remote PUD, anxiety, depression, TIA, chronic headaches, osteoporosis, glaucoma, psoriasis, hyperlipidemia   See PMH /PSH for additional history   I saw her in June 2022 for pill dysphagia and heartburn but she had been off her PPI . Suggested that pantoprazole be resumed. Barium swallow obtained and showed a moderately large CP bar and possibly mild esophagitis  Interval History:  Cynthia [name] returns. She continues to have problems swallowing pills. For several months she has also been having problems swallowing some solids, especially if she does not chew well.  Having to cut / crush pills but some of her pills are extended release and cannot be altered. Also having reflux symptoms.  She gets heartburn, sometimes several days in a row. She will not take a PPI. She takes 1/2 teaspoon of baking soda in water as needed which helps.    Labs:     Latest Ref Rng & Units 11/16/2020    2:31 PM 10/27/2020    9:12  AM 09/15/2019    5:12 AM  CBC  WBC 4.0 - 10.5 K/uL 7.9  6.0  5.7   Hemoglobin 12.0 - 15.0 g/dL 12.9  12.8  12.2   Hematocrit 36.0 - 46.0 % 38.2  37.8  36.7   Platelets 150.0 - 400.0 K/uL 376.0  346.0  331        Latest Ref Rng & Units 01/24/2021    9:49 AM 11/16/2020    2:31 PM 10/27/2020    9:12 AM  Hepatic Function  Total Protein 6.0 - 8.3 g/dL 6.5  6.8  6.7   Albumin 3.5 - 5.2 g/dL 4.4  4.6  4.5   AST 0 - 37 U/L 18  19  17   ALT 0 - 35 U/L 14  16  16   Alk Phosphatase 39 - 117 U/L 116  139  126   Total Bilirubin 0.2 - 1.2 mg/dL 0.6  0.5  0.5   Bilirubin, Direct 0.0 - 0.3 mg/dL 0.1        Past Medical History:  Diagnosis Date   Allergy    Anxiety    Arthritis    Blood transfusion without reported diagnosis    Constipation, chronic    DEGENERATIVE JOINT DISEASE 06/02/2010   Depression    Glaucoma 05/15/2012   Right eye   Headache(784.0)    Hyperlipidemia    hx of   Osteopenia    Psoriasis    PUD (peptic ulcer disease) 05/15/2010     Upper GI bleed d/t NSAIDs   RLS (restless legs syndrome)    robaxin prn   TIA (transient ischemic attack) 09/2019    Past Surgical History:  Procedure Laterality Date   ANKLE FRACTURE SURGERY Left    surgery 12-11 (LEFT)   CESAREAN SECTION  05/16/1967   COLONOSCOPY  12/17/2013   TOTAL KNEE ARTHROPLASTY 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     Current Outpatient Medications  Medication Sig Dispense Refill   aspirin EC 81 MG tablet Take 81 mg by mouth daily. Swallow whole.     buPROPion (WELLBUTRIN XL) 300 MG 24 hr tablet Take 1 tablet (300 mg total) by mouth daily. 90 tablet 1   cetirizine (ZYRTEC) 10 MG tablet Take 10 mg by mouth at bedtime as needed for allergies.     ezetimibe (ZETIA) 10 MG tablet Take 1 tablet (10 mg total) by mouth daily. 90 tablet 1   gabapentin (NEURONTIN) 100 MG capsule TAKE ONE CAPSULE BY MOUTH AT  BEDTIME 30 capsule 5   methocarbamol (ROBAXIN) 500 MG tablet TAKE ONE (1) TABLET BY MOUTH 3 TIMES DAILY AS NEEDED FOR MUSCLE SPASMS 90 tablet 1   sertraline (ZOLOFT) 100 MG tablet TAKE TWO TABLETS BY MOUTH DAILY 180 tablet 1   Travoprost, BAK Free, (TRAVATAN) 0.004 % SOLN ophthalmic solution Place 1 drop into the right eye at bedtime.     zolpidem (AMBIEN) 5 MG tablet Take 1 tablet (5 mg total) by mouth at bedtime as needed for sleep. 30 tablet 1   No current facility-administered medications for this visit.    Review of Systems: No chest pain. No shortness of breath. No urinary complaints.    Physical Exam  Wt Readings from Last 3 Encounters:  03/23/22 165 lb 4 oz (75 kg)  12/14/21 166 lb 6.4 oz (75.5 kg)  12/01/21 166 lb (75.3 kg)    BP 90/60   Pulse 95   Ht 5' (1.524 m)   Wt 165 lb 4 oz (75 kg)   BMI 32.27 kg/m  Constitutional:  Generally well appearing female in no acute distress. Psychiatric: Pleasant. Normal mood and affect. Behavior is normal. EENT: Pupils normal.  Conjunctivae are normal. No scleral icterus. Neck supple.  Cardiovascular: Normal rate, regular rhythm. No edema Pulmonary/chest: Effort normal and breath sounds normal. No wheezing, rales or rhonchi. Abdominal: Soft, nondistended, nontender. Bowel sounds active throughout. There are no masses palpable. No hepatomegaly. Neurological: Alert and oriented to person place and time. Skin: Skin is warm and dry. No rashes noted.  Tye Savoy, NP  03/23/2022, 10:09 AM

## 2022-03-30 DIAGNOSIS — R2689 Other abnormalities of gait and mobility: Secondary | ICD-10-CM | POA: Diagnosis not present

## 2022-03-30 DIAGNOSIS — Z96652 Presence of left artificial knee joint: Secondary | ICD-10-CM | POA: Diagnosis not present

## 2022-03-30 DIAGNOSIS — Z4789 Encounter for other orthopedic aftercare: Secondary | ICD-10-CM | POA: Diagnosis not present

## 2022-03-30 DIAGNOSIS — M6281 Muscle weakness (generalized): Secondary | ICD-10-CM | POA: Diagnosis not present

## 2022-04-04 ENCOUNTER — Ambulatory Visit: Payer: Medicare PPO | Admitting: Internal Medicine

## 2022-04-04 DIAGNOSIS — M6281 Muscle weakness (generalized): Secondary | ICD-10-CM | POA: Diagnosis not present

## 2022-04-04 DIAGNOSIS — R2689 Other abnormalities of gait and mobility: Secondary | ICD-10-CM | POA: Diagnosis not present

## 2022-04-04 DIAGNOSIS — Z96652 Presence of left artificial knee joint: Secondary | ICD-10-CM | POA: Diagnosis not present

## 2022-04-04 DIAGNOSIS — Z4789 Encounter for other orthopedic aftercare: Secondary | ICD-10-CM | POA: Diagnosis not present

## 2022-04-05 ENCOUNTER — Ambulatory Visit (AMBULATORY_SURGERY_CENTER): Payer: Medicare PPO | Admitting: Internal Medicine

## 2022-04-05 ENCOUNTER — Encounter: Payer: Self-pay | Admitting: Internal Medicine

## 2022-04-05 VITALS — BP 149/89 | HR 82 | Temp 97.3°F | Resp 22 | Ht 60.0 in | Wt 165.0 lb

## 2022-04-05 DIAGNOSIS — K2289 Other specified disease of esophagus: Secondary | ICD-10-CM | POA: Diagnosis not present

## 2022-04-05 DIAGNOSIS — R1310 Dysphagia, unspecified: Secondary | ICD-10-CM | POA: Diagnosis not present

## 2022-04-05 DIAGNOSIS — K2980 Duodenitis without bleeding: Secondary | ICD-10-CM | POA: Diagnosis not present

## 2022-04-05 DIAGNOSIS — G2581 Restless legs syndrome: Secondary | ICD-10-CM | POA: Diagnosis not present

## 2022-04-05 DIAGNOSIS — K222 Esophageal obstruction: Secondary | ICD-10-CM

## 2022-04-05 DIAGNOSIS — K219 Gastro-esophageal reflux disease without esophagitis: Secondary | ICD-10-CM | POA: Diagnosis not present

## 2022-04-05 DIAGNOSIS — K209 Esophagitis, unspecified without bleeding: Secondary | ICD-10-CM

## 2022-04-05 DIAGNOSIS — K319 Disease of stomach and duodenum, unspecified: Secondary | ICD-10-CM | POA: Diagnosis not present

## 2022-04-05 DIAGNOSIS — K2100 Gastro-esophageal reflux disease with esophagitis, without bleeding: Secondary | ICD-10-CM

## 2022-04-05 DIAGNOSIS — K259 Gastric ulcer, unspecified as acute or chronic, without hemorrhage or perforation: Secondary | ICD-10-CM | POA: Diagnosis not present

## 2022-04-05 DIAGNOSIS — K449 Diaphragmatic hernia without obstruction or gangrene: Secondary | ICD-10-CM

## 2022-04-05 DIAGNOSIS — F419 Anxiety disorder, unspecified: Secondary | ICD-10-CM | POA: Diagnosis not present

## 2022-04-05 DIAGNOSIS — F32A Depression, unspecified: Secondary | ICD-10-CM | POA: Diagnosis not present

## 2022-04-05 MED ORDER — OMEPRAZOLE 40 MG PO CPDR: 40.0000 mg | DELAYED_RELEASE_CAPSULE | Freq: Every day | ORAL | 0 refills | Status: DC

## 2022-04-05 MED ORDER — SODIUM CHLORIDE 0.9 % IV SOLN: 500.0000 mL | Freq: Once | INTRAVENOUS | Status: DC

## 2022-04-05 NOTE — Patient Instructions (Addendum)
Take your prilosec daily for 2 months on an empty stomach.  Read all of the handouts given to you by your recovery room nurse.  Adhere to the special diet today.  Resume all of your medications today. There will be an office visit for your to speak to Dr Lorenso Courier in approximately 6 weeks per Dr's openings in the office.  YOU HAD AN ENDOSCOPIC PROCEDURE TODAY AT [facility]:   Refer to the procedure report that was given to you for any specific questions about what was found during the examination.  If the procedure report does not answer your questions, please call your gastroenterologist to clarify.  If you requested that your care partner not be given the details of your procedure findings, then the procedure report has been included in a sealed envelope for you to review at your convenience later.  YOU SHOULD EXPECT: Some feelings of bloating in the abdomen. Passage of more gas than usual.  Walking can help get rid of the air that was put into your GI tract during the procedure and reduce the bloating.   Please Note:  You might notice some irritation and congestion in your nose or some drainage.  This is from the oxygen used during your procedure.  There is no need for concern and it should clear up in a day or so.  SYMPTOMS TO REPORT IMMEDIATELY:  Following upper endoscopy (EGD)  Vomiting of blood or coffee ground material  New chest pain or pain under the shoulder blades  Painful or persistently difficult swallowing  New shortness of breath  Fever of 100F or higher  Black, tarry-looking stools  For urgent or emergent issues, a gastroenterologist can be reached at any hour by calling [phone]. Do not use MyChart messaging for urgent concerns.    DIET:  We do recommend nothing by mouth until 1:15 pm.  Clear liquids after that. If no problems, you may have a soft diet for the rest of today. You may proceed to your regular diet.  Drink plenty of fluids but you should  avoid alcoholic beverages for 24 hours.  ACTIVITY:  You should plan to take it easy for the rest of today and you should NOT DRIVE or use heavy machinery until tomorrow (because of the sedation medicines used during the test).    FOLLOW UP: Our staff will call the number listed on your records the next business day following your procedure.  We will call around 7:15- 8:00 am to check on you and address any questions or concerns that you may have regarding the information given to you following your procedure. If we do not reach you, we will leave a message.     If any biopsies were taken you will be contacted by phone or by letter within the next 1-3 weeks.  Please call us at [phone] if you have not heard about the biopsies in 3 weeks.    SIGNATURES/CONFIDENTIALITY: You and/or your care partner have signed paperwork which will be entered into your electronic medical record.  These signatures attest to the fact that that the information above on your After Visit Summary has been reviewed and is understood.  Full responsibility of the confidentiality of this discharge information lies with you and/or your care-partner.

## 2022-04-05 NOTE — Progress Notes (Signed)
VS completed by DT.  Pt's states no medical or surgical changes since previsit or office visit.  

## 2022-04-05 NOTE — Progress Notes (Signed)
A and O x3. Report to RN. Tolerated MAC anesthesia well.Teeth unchanged after procedure. 

## 2022-04-05 NOTE — Progress Notes (Signed)
GASTROENTEROLOGY PROCEDURE H&P NOTE   Primary Care Physician: Colon Branch, MD    Reason for Procedure:   Dysphagia, GERD  Plan:    EGD  Patient is appropriate for endoscopic procedure(s) in the ambulatory ([place]) setting.  The nature of the procedure, as well as the risks, benefits, and alternatives were carefully and thoroughly reviewed with the patient. Ample time for discussion and questions allowed. The patient understood, was satisfied, and agreed to proceed.     HPI: Cynthia [name] is a 76 y.o. female who presents for EGD for evaluation of dysphagia and GERD .  Patient was most recently seen in the Gastroenterology Clinic on 03/23/22.  No interval change in medical history since that appointment. Please refer to that note for full details regarding GI history and clinical presentation.   Past Medical History:  Diagnosis Date   Allergy    Anxiety    Arthritis    Blood transfusion without reported diagnosis    Constipation, chronic    DEGENERATIVE JOINT DISEASE 06/02/2010   Depression    Glaucoma 05/15/2012   Right eye   Headache(784.0)    Hyperlipidemia    hx of   Osteopenia    Psoriasis    PUD (peptic ulcer disease) 05/15/2010   Upper GI bleed d/t NSAIDs   RLS (restless legs syndrome)    robaxin prn   TIA (transient ischemic attack) 09/2019    Past Surgical History:  Procedure Laterality Date   ANKLE FRACTURE SURGERY Left    surgery 12-11 (LEFT)   CESAREAN SECTION  05/16/1967   COLONOSCOPY  12/17/2013   TOTAL KNEE ARTHROPLASTY Left 12/28/2021    Prior to Admission medications   Medication Sig Start Date End Date Taking? Authorizing Provider  aspirin EC 81 MG tablet Take 81 mg by mouth daily. Swallow whole.   Yes [provider]  buPROPion (WELLBUTRIN XL) 300 MG 24 hr tablet Take 1 tablet (300 mg total) by mouth daily. 01/24/22  Yes Paz, Alda Berthold, MD  cetirizine (ZYRTEC) 10 MG tablet Take 10 mg by mouth at bedtime as needed for allergies.   Yes  [provider]  ezetimibe (ZETIA) 10 MG tablet Take 1 tablet (10 mg total) by mouth daily. 01/24/22  Yes Paz, Alda Berthold, MD  gabapentin (NEURONTIN) 100 MG capsule TAKE ONE CAPSULE BY MOUTH AT BEDTIME 01/12/22  Yes Jaffe, Adam R, DO  sertraline (ZOLOFT) 100 MG tablet TAKE TWO TABLETS BY MOUTH DAILY 11/30/21  Yes Paz, Jacqulyn Bath E, MD  Travoprost, BAK Free, (TRAVATAN) 0.004 % SOLN ophthalmic solution Place 1 drop into the right eye at bedtime.   Yes [provider]  amoxicillin (AMOXIL) 500 MG capsule SMARTSIG:4 Capsule(s) By Mouth Once 02/07/22   [provider]  methocarbamol (ROBAXIN) 500 MG tablet TAKE ONE (1) TABLET BY MOUTH 3 TIMES DAILY AS NEEDED FOR MUSCLE SPASMS 06/10/21   Colon Branch, MD  zolpidem (AMBIEN) 5 MG tablet Take 1 tablet (5 mg total) by mouth at bedtime as needed for sleep. 09/22/21   Colon Branch, MD    Current Outpatient Medications  Medication Sig Dispense Refill   aspirin EC 81 MG tablet Take 81 mg by mouth daily. Swallow whole.     buPROPion (WELLBUTRIN XL) 300 MG 24 hr tablet Take 1 tablet (300 mg total) by mouth daily. 90 tablet 1   cetirizine (ZYRTEC) 10 MG tablet Take 10 mg by mouth at bedtime as needed for allergies.     ezetimibe (  ZETIA) 10 MG tablet Take 1 tablet (10 mg total) by mouth daily. 90 tablet 1   gabapentin (NEURONTIN) 100 MG capsule TAKE ONE CAPSULE BY MOUTH AT BEDTIME 30 capsule 5   sertraline (ZOLOFT) 100 MG tablet TAKE TWO TABLETS BY MOUTH DAILY 180 tablet 1   Travoprost, BAK Free, (TRAVATAN) 0.004 % SOLN ophthalmic solution Place 1 drop into the right eye at bedtime.     amoxicillin (AMOXIL) 500 MG capsule SMARTSIG:4 Capsule(s) By Mouth Once     methocarbamol (ROBAXIN) 500 MG tablet TAKE ONE (1) TABLET BY MOUTH 3 TIMES DAILY AS NEEDED FOR MUSCLE SPASMS 90 tablet 1   zolpidem (AMBIEN) 5 MG tablet Take 1 tablet (5 mg total) by mouth at bedtime as needed for sleep. 30 tablet 1   Current Facility-Administered Medications  Medication Dose  Route Frequency Provider Last Rate Last Admin   0.9 %  sodium chloride infusion  500 mL Intravenous Once Sharyn Creamer, MD        Allergies as of 04/05/2022 - Review Complete 04/05/2022  Allergen Reaction Noted   Bee pollen Other (See Comments) 05/18/2014   Motrin [ibuprofen]  11/08/2010   Pollen extract  05/18/2014   Statins  12/04/2013   Sulfonamide derivatives  12/06/2007   Ciprofloxacin Swelling and Rash 11/08/2010    Family History  Problem Relation Age of Onset   Aneurysm Mother        brain   Celiac disease Brother    Diabetes Maternal Grandfather    Hypertension Neg Hx    Breast cancer Neg Hx    Coronary artery disease Neg Hx    Colon cancer Neg Hx    Esophageal cancer Neg Hx    Stomach cancer Neg Hx    Rectal cancer Neg Hx     Social History   Socioeconomic History   Marital status: Widowed    Spouse name: Not on file   Number of children: 1   Years of education: Not on file   Highest education level: Doctorate  Occupational History   Occupation: retired VP of Tax inspector: RETIRED  Tobacco Use   Smoking status: Never    Passive exposure: Never   Smokeless tobacco: Never  Vaping Use   Vaping Use: Never used  Substance and Sexual Activity   Alcohol use: Yes    Comment: socially   Drug use: No   Sexual activity: Not Currently  Other Topics Concern   Not on file  Social History Narrative   2 step children, 1 child , 7 GK   Lost husband, moved [place] 02-13-2020 ; apartment, 3th floor w/ elevator      Pt is right handed   Social Determinants of Health   Financial Resource Strain: [place]  (05/17/2021)   Overall Financial Resource Strain (CARDIA)    Difficulty of Paying Living Expenses: Not hard at all  Food Insecurity: No Food Insecurity (05/17/2021)   Hunger Vital Sign    Worried About Running Out of Food in the Last Year: Never true    [place] in the Last Year: Never true  Transportation Needs: No Transportation Needs  (05/17/2021)   PRAPARE - Hydrologist (Medical): No    Lack of Transportation (Non-Medical): No  Physical Activity: Sufficiently Active (05/17/2021)   Exercise Vital Sign    Days of Exercise per Week: 4 days    Minutes of Exercise per Session: 40 min  Stress: No Stress Concern Present (05/17/2021)   [place]    Feeling of Stress : Only a little  Social Connections: Moderately Integrated (05/17/2021)   Social Connection and Isolation Panel [NHANES]    Frequency of Communication with Friends and Family: More than three times a week    Frequency of Social Gatherings with Friends and Family: More than three times a week    Attends Religious Services: More than 4 times per year    Active Member of [employer] or Organizations: Yes    Attends Archivist Meetings: More than 4 times per year    Marital Status: Widowed  Intimate Partner Violence: Not At Risk (05/17/2021)   Humiliation, Afraid, Rape, and Kick questionnaire    Fear of Current or Ex-Partner: No    Emotionally Abused: No    Physically Abused: No    Sexually Abused: No    Physical Exam: Vital signs in last 24 hours: BP (!) 152/74   Pulse 82   Temp (!) 97.3 F (36.3 C) (Temporal)   Ht 5' (1.524 m)   Wt 165 lb (74.8 kg)   SpO2 95%   BMI 32.22 kg/m  GEN: NAD EYE: Sclerae anicteric ENT: MMM CV: Non-tachycardic Pulm: No increased WOB GI: Soft NEURO:  Alert & Oriented   Christia Reading, MD [facility]   04/05/2022 10:08 AM

## 2022-04-05 NOTE — Op Note (Signed)
[place] Patient Name: Cynthia [name] Procedure Date: 04/05/2022 10:10 AM MRN: [id number] Endoscopist: Georgian Co , , [id number] Age: 76 Referring MD:  Date of Birth: January 23, 1946 Gender: Female Account #: [id number] Procedure:                Upper GI endoscopy Indications:              Dysphagia, Heartburn Medicines:                Monitored Anesthesia Care Procedure:                Pre-Anesthesia Assessment:                           - Prior to the procedure, a History and Physical                            was performed, and patient medications and                            allergies were reviewed. The patient's tolerance of                            previous anesthesia was also reviewed. The risks                            and benefits of the procedure and the sedation                            options and risks were discussed with the patient.                            All questions were answered, and informed consent                            was obtained. Prior Anticoagulants: The patient has                            taken no anticoagulant or antiplatelet agents. ASA                            Grade Assessment: II - A patient with mild systemic                            disease. After reviewing the risks and benefits,                            the patient was deemed in satisfactory condition to                            undergo the procedure.                           After obtaining informed consent, the endoscope was  passed under direct vision. Throughout the                            procedure, the patient's blood pressure, pulse, and                            oxygen saturations were monitored continuously. The                            GIF [id number] #[id number] was introduced through the                            mouth, and advanced to the second part of duodenum.                            The upper GI endoscopy was  accomplished without                            difficulty. The patient tolerated the procedure                            well. Scope In: Scope Out: Findings:                 One benign-appearing, intrinsic stenosis was found                            at the cricopharyngeus. This stenosis measured less                            than one cm (in length). The stenosis was                            traversed. A guidewire was placed and the scope was                            withdrawn. Dilation was performed with a Savary                            dilator with mild resistance at 15 mm. The dilation                            site was examined following endoscope reinsertion                            and showed mild mucosal disruption.                           White nummular lesions were noted in the entire                            esophagus. Biopsies were taken with a cold forceps  for histology.                           LA Grade A (one or more mucosal breaks less than 5                            mm, not extending between tops of 2 mucosal folds)                            esophagitis with no bleeding was found in the                            distal esophagus.                           A 2 cm hiatal hernia was present.                           A few localized erosions with no bleeding and no                            stigmata of recent bleeding were found in the                            gastric antrum. Biopsies were taken with a cold                            forceps for histology.                           Localized mild inflammation characterized by                            congestion (edema) and erythema was found in the                            duodenal bulb. Complications:            No immediate complications. Estimated Blood Loss:     Estimated blood loss was minimal. Impression:               - Benign-appearing esophageal stenosis.  Dilated.                           - White nummular lesions in esophageal mucosa.                            Biopsied.                           - LA Grade A esophagitis with no bleeding.                           - 2 cm hiatal hernia.                           -  Erosive gastropathy with no bleeding and no                            stigmata of recent bleeding. Biopsied.                           - Duodenitis. Recommendation:           - Discharge patient to home (with escort).                           - Await pathology results.                           - Post dilation diet.                           - Use Prilosec (omeprazole) 40 mg PO BID for 8                            weeks.                           - Return to GI clinic in 6 weeks.                           - The findings and recommendations were discussed                            with the patient. Dr Georgian Co "Lyndee Leo" Lorenso Courier,  04/05/2022 10:39:20 AM

## 2022-04-05 NOTE — Progress Notes (Signed)
Patient's care partner kept asking me to give to the patient water. I explained several times that the patient couldn't have water yet due to her dilation.  The care partner kept asking questions about discharge.  I again explained that I would go over all of the discharge instructions and that the patient would have a copy.

## 2022-04-05 NOTE — Progress Notes (Signed)
Called to room to assist during endoscopic procedure.  Patient ID and intended procedure confirmed with present staff. Received instructions for my participation in the procedure from the performing physician.  

## 2022-04-07 DIAGNOSIS — Z4789 Encounter for other orthopedic aftercare: Secondary | ICD-10-CM | POA: Diagnosis not present

## 2022-04-07 DIAGNOSIS — M6281 Muscle weakness (generalized): Secondary | ICD-10-CM | POA: Diagnosis not present

## 2022-04-07 DIAGNOSIS — Z96652 Presence of left artificial knee joint: Secondary | ICD-10-CM | POA: Diagnosis not present

## 2022-04-07 DIAGNOSIS — R2689 Other abnormalities of gait and mobility: Secondary | ICD-10-CM | POA: Diagnosis not present

## 2022-04-10 ENCOUNTER — Telehealth: Payer: Self-pay

## 2022-04-10 ENCOUNTER — Ambulatory Visit: Payer: Medicare PPO | Admitting: Internal Medicine

## 2022-04-10 ENCOUNTER — Encounter: Payer: Self-pay | Admitting: Internal Medicine

## 2022-04-10 ENCOUNTER — Other Ambulatory Visit (HOSPITAL_BASED_OUTPATIENT_CLINIC_OR_DEPARTMENT_OTHER): Payer: Self-pay

## 2022-04-10 VITALS — BP 126/82 | HR 82 | Temp 97.8°F | Resp 16 | Ht 64.0 in | Wt 166.0 lb

## 2022-04-10 DIAGNOSIS — K5900 Constipation, unspecified: Secondary | ICD-10-CM

## 2022-04-10 DIAGNOSIS — E785 Hyperlipidemia, unspecified: Secondary | ICD-10-CM

## 2022-04-10 DIAGNOSIS — G250 Essential tremor: Secondary | ICD-10-CM | POA: Diagnosis not present

## 2022-04-10 DIAGNOSIS — M8000XG Age-related osteoporosis with current pathological fracture, unspecified site, subsequent encounter for fracture with delayed healing: Secondary | ICD-10-CM | POA: Diagnosis not present

## 2022-04-10 DIAGNOSIS — K219 Gastro-esophageal reflux disease without esophagitis: Secondary | ICD-10-CM

## 2022-04-10 MED ORDER — PROPRANOLOL HCL 10 MG PO TABS: 10.0000 mg | ORAL_TABLET | Freq: Three times a day (TID) | ORAL | 2 refills | Status: DC

## 2022-04-10 NOTE — Telephone Encounter (Signed)
  Follow up Call-     04/05/2022    9:06 AM 07/11/2021    7:55 AM  Call back number  Post procedure Call Back phone  # [phone]  Permission to leave phone message Yes Yes     Patient questions:  Do you have a fever, pain , or abdominal swelling? No. Pain Score  0 *  Have you tolerated food without any problems? Yes.    Have you been able to return to your normal activities? Yes.    Do you have any questions about your discharge instructions: Diet   No. Medications  No. Follow up visit  No.  Do you have questions or concerns about your Care? No.  Actions: * If pain score is 4 or above: No action needed, pain <4. Patient states she is eating, but still has a mild sore throat increased when she swallows, not other symptoms. Asked patient if she is drinking enough fluids, she states she probably isn't. Advised patient to increase fluid intake, cut her food up into small pieces, chew well before she swallows, have liquids with her at the ready when she's eating and to also try warm salt water gargles for comfort. Call us if this is not improved in the next day or two and immediately if it worsens. Patient verbalizes understanding.

## 2022-04-10 NOTE — Patient Instructions (Addendum)
For tremors: Start propanolol 10 mg 1 tablet 3 times a day as needed. If that is not helping your tremors, please let me know, send me a message. Also please check your blood pressure, this medication can decrease your blood pressure.  Please call  your sports medicine doctor about osteoporosis  For constipation: MiraLAX daily Consider use a glycerin suppository Drink plenty of fluids If you are not getting better or you develop stomach pain nausea or vomiting: Call the office   Vaccines I recommend:  RSV vaccine  Check the  blood pressure regularly BP GOAL is between 110/65 and  135/85. If it is consistently higher or lower, let me know    GO TO THE LAB : Get the blood work     [place], [place] back for   for a physical exam in 3 months

## 2022-04-10 NOTE — Progress Notes (Signed)
Subjective:    Patient ID: Cynthia [name], female    DOB: Jul 29, 1945, 76 y.o.   MRN: [id number]  DOS:  04/10/2022 Type of visit - description: Follow-up  Her main concern is tremors, she has these for a good while, the tremors are affecting her writing and sometimes difficulty using a spoon. Neurology note reviewed, migraines are better. GERD: Had a endoscopy, still has some heartburn. Constipation: Developed a few days ago, denies nausea vomiting.  No blood in the stools. She also complains of some "moist" around the vaginal area.  Denies dysuria, gross hematuria.  No vaginal discharge or genital rash  Review of Systems See above   Past Medical History:  Diagnosis Date   Allergy    Anxiety    Arthritis    Blood transfusion without reported diagnosis    Constipation, chronic    DEGENERATIVE JOINT DISEASE 06/02/2010   Depression    Glaucoma 05/15/2012   Right eye   Headache(784.0)    Hyperlipidemia    hx of   Osteopenia    Psoriasis    PUD (peptic ulcer disease) 05/15/2010   Upper GI bleed d/t NSAIDs   RLS (restless legs syndrome)    robaxin prn   TIA (transient ischemic attack) 09/2019    Past Surgical History:  Procedure Laterality Date   ANKLE FRACTURE SURGERY Left    surgery 12-11 (LEFT)   CESAREAN SECTION  05/16/1967   COLONOSCOPY  12/17/2013   TOTAL KNEE ARTHROPLASTY Left 12/28/2021    Current Outpatient Medications  Medication Instructions   amoxicillin (AMOXIL) 500 MG capsule SMARTSIG:4 Capsule(s) By Mouth Once   aspirin EC 81 mg, Oral, Daily, Swallow whole.   buPROPion (WELLBUTRIN XL) 300 mg, Oral, Daily   cetirizine (ZYRTEC) 10 mg, Oral, At bedtime PRN,     ezetimibe (ZETIA) 10 mg, Oral, Daily   gabapentin (NEURONTIN) 100 mg, Oral, Daily at bedtime   methocarbamol (ROBAXIN) 500 MG tablet TAKE ONE (1) TABLET BY MOUTH 3 TIMES DAILY AS NEEDED FOR MUSCLE SPASMS   omeprazole (PRILOSEC) 40 mg, Oral, Daily   propranolol (INDERAL) 10 mg, Oral, 3 times  daily   sertraline (ZOLOFT) 100 MG tablet TAKE TWO TABLETS BY MOUTH DAILY   Travoprost, BAK Free, (TRAVATAN) 0.004 % SOLN ophthalmic solution 1 drop, Right Eye, Daily at bedtime   zolpidem (AMBIEN) 5 mg, Oral, At bedtime PRN       Objective:   Physical Exam BP 126/82   Pulse 82   Temp 97.8 F (36.6 C) (Oral)   Resp 16   Ht '5\' 4"'$  (1.626 m)   Wt 166 lb (75.3 kg)   SpO2 98%   BMI 28.49 kg/m  General:   Well developed, NAD, BMI noted. HEENT:  Normocephalic . Face symmetric, atraumatic Lungs:  CTA B Normal respiratory effort, no intercostal retractions, no accessory muscle use. Heart: RRR,  no murmur.  Lower extremities: no pretibial edema bilaterally  Skin: Not pale. Not jaundice Neurologic:  alert & oriented X3.  Speech normal, gait appropriate for age and unassisted. Hands: Some tremors noted, no rhythmic jaw or head motion. Psych--  Cognition and judgment appear intact.  Cooperative with normal attention span and concentration.  Behavior appropriate. No anxious or depressed appearing.      Assessment    Assessment Hyperlipidemia: Lipitor causes myalgias, declined other statins Anxiety depression insomnia DJD Migraines, chronic: Saw neurology 12/01/2020: Failed verapamil, nortriptyline and topiramate. Rx Emgality.  Tramadol prn Osteoporosis: 07-2014 T score of -1.7, T score (  November 2021) -2.8: sports medicine Rx Prolia PUD due to NSAIDs RLS - robaxin  prn Neuropathy: dx sports meds  Glaucoma Chronic constipation Skin psoriasis , Dx LSC (lichen )  ~ 1700 , sees derm   PLAN: Essential tremors: This is the first time the patient reports tremors, mostly at her hands, suspect this is essential tremor. Plan: Trial with propranolol, low-dose, titrate up if needed.  Patient to let me know how this is working.  She will also let me know about her blood pressures. Hyperlipidemia: On Zetia. Anxiety depression: Currently doing well on Wellbutrin and sertraline. GERD,  history of PUD: Seen by GI 03/23/2022, they recommended a EGD for dysphagia, EGD show a benign-appearing esophageal stenosis, dilated.  Biopsies taken, results pending Migraines Saw neurology 8-23, headache felt to be cervicogenic. Also has migraine with aura, improved w/ Emgality Transient left hemisensory loss: TIA versus migraine.  Advised to follow-up in 6 months Osteoporosis: Was on Evenity prescribed at sports medicine.  Currently untreated, recommend to reach out to sports medicine for further advise.  Constipation: New problem, acute, going on for few days.  Recommend MiraLAX, possibly glycerin suppository. Call if no better Vaccine advise: Recommend RSV RTC CPX 3 months

## 2022-04-10 NOTE — Assessment & Plan Note (Signed)
Essential tremors: This is the first time the patient reports tremors, mostly at her hands, suspect this is essential tremor. Plan: Trial with propranolol, low-dose, titrate up if needed.  Patient to let me know how this is working.  She will also let me know about her blood pressures. Hyperlipidemia: On Zetia. Anxiety depression: Currently doing well on Wellbutrin and sertraline. GERD, history of PUD: Seen by GI 03/23/2022, they recommended a EGD for dysphagia, EGD show a benign-appearing esophageal stenosis, dilated.  Biopsies taken, results pending Migraines Saw neurology 8-23, headache felt to be cervicogenic. Also has migraine with aura, improved w/ Emgality Transient left hemisensory loss: TIA versus migraine.  Advised to follow-up in 6 months Osteoporosis: Was on Evenity prescribed at sports medicine.  Currently untreated, recommend to reach out to sports medicine for further advise.  Constipation: New problem, acute, going on for few days.  Recommend MiraLAX, possibly glycerin suppository. Call if no better Vaccine advise: Recommend RSV RTC CPX 3 months

## 2022-04-11 ENCOUNTER — Encounter: Payer: Self-pay | Admitting: Internal Medicine

## 2022-04-11 DIAGNOSIS — M25562 Pain in left knee: Secondary | ICD-10-CM | POA: Diagnosis not present

## 2022-04-11 LAB — CBC WITH DIFFERENTIAL/PLATELET
Basophils Absolute: 0 10*3/uL (ref 0.0–0.1)
Basophils Relative: 0.6 % (ref 0.0–3.0)
Eosinophils Absolute: 0.4 10*3/uL (ref 0.0–0.7)
Eosinophils Relative: 5.1 % — ABNORMAL HIGH (ref 0.0–5.0)
HCT: 30.5 % — ABNORMAL LOW (ref 36.0–46.0)
Hemoglobin: 10 g/dL — ABNORMAL LOW (ref 12.0–15.0)
Lymphocytes Relative: 19 % (ref 12.0–46.0)
Lymphs Abs: 1.4 10*3/uL (ref 0.7–4.0)
MCHC: 32.9 g/dL (ref 30.0–36.0)
MCV: 88.6 fl (ref 78.0–100.0)
Monocytes Absolute: 0.9 10*3/uL (ref 0.1–1.0)
Monocytes Relative: 11.4 % (ref 3.0–12.0)
Neutro Abs: 4.8 10*3/uL (ref 1.4–7.7)
Neutrophils Relative %: 63.9 % (ref 43.0–77.0)
Platelets: 350 10*3/uL (ref 150.0–400.0)
RBC: 3.44 Mil/uL — ABNORMAL LOW (ref 3.87–5.11)
RDW: 15.7 % — ABNORMAL HIGH (ref 11.5–15.5)
WBC: 7.5 10*3/uL (ref 4.0–10.5)

## 2022-04-11 LAB — BASIC METABOLIC PANEL
BUN: 18 mg/dL (ref 6–23)
CO2: 26 mEq/L (ref 19–32)
Calcium: 9.2 mg/dL (ref 8.4–10.5)
Chloride: 104 mEq/L (ref 96–112)
Creatinine, Ser: 1.07 mg/dL (ref 0.40–1.20)
GFR: 50 mL/min — ABNORMAL LOW (ref >60.00)
Glucose, Bld: 83 mg/dL (ref 70–99)
Potassium: 4.2 mEq/L (ref 3.5–5.1)
Sodium: 138 mEq/L (ref 135–145)

## 2022-04-12 ENCOUNTER — Other Ambulatory Visit (INDEPENDENT_AMBULATORY_CARE_PROVIDER_SITE_OTHER): Payer: Medicare PPO

## 2022-04-12 ENCOUNTER — Other Ambulatory Visit: Payer: Self-pay

## 2022-04-12 DIAGNOSIS — M6281 Muscle weakness (generalized): Secondary | ICD-10-CM | POA: Diagnosis not present

## 2022-04-12 DIAGNOSIS — D649 Anemia, unspecified: Secondary | ICD-10-CM

## 2022-04-12 DIAGNOSIS — Z96652 Presence of left artificial knee joint: Secondary | ICD-10-CM | POA: Diagnosis not present

## 2022-04-12 DIAGNOSIS — Z4789 Encounter for other orthopedic aftercare: Secondary | ICD-10-CM | POA: Diagnosis not present

## 2022-04-12 DIAGNOSIS — R2689 Other abnormalities of gait and mobility: Secondary | ICD-10-CM | POA: Diagnosis not present

## 2022-04-12 LAB — IRON: Iron: 41 ug/dL — ABNORMAL LOW (ref 42–145)

## 2022-04-12 LAB — FERRITIN: Ferritin: 23.1 ng/mL (ref 10.0–291.0)

## 2022-04-14 DIAGNOSIS — M6281 Muscle weakness (generalized): Secondary | ICD-10-CM | POA: Diagnosis not present

## 2022-04-14 DIAGNOSIS — Z96652 Presence of left artificial knee joint: Secondary | ICD-10-CM | POA: Diagnosis not present

## 2022-04-14 DIAGNOSIS — R2689 Other abnormalities of gait and mobility: Secondary | ICD-10-CM | POA: Diagnosis not present

## 2022-04-14 DIAGNOSIS — Z4789 Encounter for other orthopedic aftercare: Secondary | ICD-10-CM | POA: Diagnosis not present

## 2022-04-17 DIAGNOSIS — Z4789 Encounter for other orthopedic aftercare: Secondary | ICD-10-CM | POA: Diagnosis not present

## 2022-04-17 DIAGNOSIS — M6281 Muscle weakness (generalized): Secondary | ICD-10-CM | POA: Diagnosis not present

## 2022-04-17 DIAGNOSIS — R2689 Other abnormalities of gait and mobility: Secondary | ICD-10-CM | POA: Diagnosis not present

## 2022-04-17 DIAGNOSIS — Z96652 Presence of left artificial knee joint: Secondary | ICD-10-CM | POA: Diagnosis not present

## 2022-04-20 DIAGNOSIS — Z96652 Presence of left artificial knee joint: Secondary | ICD-10-CM | POA: Diagnosis not present

## 2022-04-20 DIAGNOSIS — M6281 Muscle weakness (generalized): Secondary | ICD-10-CM | POA: Diagnosis not present

## 2022-04-20 DIAGNOSIS — Z4789 Encounter for other orthopedic aftercare: Secondary | ICD-10-CM | POA: Diagnosis not present

## 2022-04-20 DIAGNOSIS — R2689 Other abnormalities of gait and mobility: Secondary | ICD-10-CM | POA: Diagnosis not present

## 2022-04-25 DIAGNOSIS — M6281 Muscle weakness (generalized): Secondary | ICD-10-CM | POA: Diagnosis not present

## 2022-04-25 DIAGNOSIS — Z96652 Presence of left artificial knee joint: Secondary | ICD-10-CM | POA: Diagnosis not present

## 2022-04-25 DIAGNOSIS — Z4789 Encounter for other orthopedic aftercare: Secondary | ICD-10-CM | POA: Diagnosis not present

## 2022-04-25 DIAGNOSIS — R2689 Other abnormalities of gait and mobility: Secondary | ICD-10-CM | POA: Diagnosis not present

## 2022-04-27 DIAGNOSIS — Z96652 Presence of left artificial knee joint: Secondary | ICD-10-CM | POA: Diagnosis not present

## 2022-04-27 DIAGNOSIS — R2689 Other abnormalities of gait and mobility: Secondary | ICD-10-CM | POA: Diagnosis not present

## 2022-04-27 DIAGNOSIS — Z4789 Encounter for other orthopedic aftercare: Secondary | ICD-10-CM | POA: Diagnosis not present

## 2022-04-27 DIAGNOSIS — M6281 Muscle weakness (generalized): Secondary | ICD-10-CM | POA: Diagnosis not present

## 2022-05-02 DIAGNOSIS — M6281 Muscle weakness (generalized): Secondary | ICD-10-CM | POA: Diagnosis not present

## 2022-05-02 DIAGNOSIS — R2689 Other abnormalities of gait and mobility: Secondary | ICD-10-CM | POA: Diagnosis not present

## 2022-05-02 DIAGNOSIS — Z96652 Presence of left artificial knee joint: Secondary | ICD-10-CM | POA: Diagnosis not present

## 2022-05-02 DIAGNOSIS — Z4789 Encounter for other orthopedic aftercare: Secondary | ICD-10-CM | POA: Diagnosis not present

## 2022-05-04 DIAGNOSIS — R2689 Other abnormalities of gait and mobility: Secondary | ICD-10-CM | POA: Diagnosis not present

## 2022-05-04 DIAGNOSIS — M6281 Muscle weakness (generalized): Secondary | ICD-10-CM | POA: Diagnosis not present

## 2022-05-04 DIAGNOSIS — Z4789 Encounter for other orthopedic aftercare: Secondary | ICD-10-CM | POA: Diagnosis not present

## 2022-05-04 DIAGNOSIS — Z96652 Presence of left artificial knee joint: Secondary | ICD-10-CM | POA: Diagnosis not present

## 2022-05-11 DIAGNOSIS — Z96652 Presence of left artificial knee joint: Secondary | ICD-10-CM | POA: Diagnosis not present

## 2022-05-11 DIAGNOSIS — Z4789 Encounter for other orthopedic aftercare: Secondary | ICD-10-CM | POA: Diagnosis not present

## 2022-05-11 DIAGNOSIS — M6281 Muscle weakness (generalized): Secondary | ICD-10-CM | POA: Diagnosis not present

## 2022-05-11 DIAGNOSIS — R2689 Other abnormalities of gait and mobility: Secondary | ICD-10-CM | POA: Diagnosis not present

## 2022-05-16 DIAGNOSIS — Z4789 Encounter for other orthopedic aftercare: Secondary | ICD-10-CM | POA: Diagnosis not present

## 2022-05-16 DIAGNOSIS — M6281 Muscle weakness (generalized): Secondary | ICD-10-CM | POA: Diagnosis not present

## 2022-05-16 DIAGNOSIS — R2689 Other abnormalities of gait and mobility: Secondary | ICD-10-CM | POA: Diagnosis not present

## 2022-05-16 DIAGNOSIS — Z96652 Presence of left artificial knee joint: Secondary | ICD-10-CM | POA: Diagnosis not present

## 2022-05-19 ENCOUNTER — Ambulatory Visit: Payer: Medicare PPO | Admitting: Internal Medicine

## 2022-05-19 ENCOUNTER — Encounter: Payer: Self-pay | Admitting: Internal Medicine

## 2022-05-19 DIAGNOSIS — R1310 Dysphagia, unspecified: Secondary | ICD-10-CM

## 2022-05-19 DIAGNOSIS — K219 Gastro-esophageal reflux disease without esophagitis: Secondary | ICD-10-CM | POA: Diagnosis not present

## 2022-05-19 DIAGNOSIS — Z96652 Presence of left artificial knee joint: Secondary | ICD-10-CM | POA: Diagnosis not present

## 2022-05-19 DIAGNOSIS — R2689 Other abnormalities of gait and mobility: Secondary | ICD-10-CM | POA: Diagnosis not present

## 2022-05-19 DIAGNOSIS — K259 Gastric ulcer, unspecified as acute or chronic, without hemorrhage or perforation: Secondary | ICD-10-CM | POA: Diagnosis not present

## 2022-05-19 DIAGNOSIS — K200 Eosinophilic esophagitis: Secondary | ICD-10-CM

## 2022-05-19 DIAGNOSIS — K449 Diaphragmatic hernia without obstruction or gangrene: Secondary | ICD-10-CM

## 2022-05-19 DIAGNOSIS — K2980 Duodenitis without bleeding: Secondary | ICD-10-CM

## 2022-05-19 DIAGNOSIS — K209 Esophagitis, unspecified without bleeding: Secondary | ICD-10-CM

## 2022-05-19 DIAGNOSIS — Z4789 Encounter for other orthopedic aftercare: Secondary | ICD-10-CM | POA: Diagnosis not present

## 2022-05-19 DIAGNOSIS — M6281 Muscle weakness (generalized): Secondary | ICD-10-CM | POA: Diagnosis not present

## 2022-05-19 MED ORDER — OMEPRAZOLE 40 MG PO CPDR: 40.0000 mg | DELAYED_RELEASE_CAPSULE | Freq: Two times a day (BID) | ORAL | 2 refills | Status: DC

## 2022-05-19 NOTE — Patient Instructions (Addendum)
_______________________________________________________  If you are age 78 or older, your body mass index should be between 23-30. Your Body mass index is 29.01 kg/m. If this is out of the aforementioned range listed, please consider follow up with your Primary Care Provider. ________________________________________________________  The [facility] providers would like to encourage you to use [facility] to communicate with providers for non-urgent requests or questions.  Due to long hold times on the telephone, sending your provider a message by [facility] may be a faster and more efficient way to get a response.  Please allow 48 business hours for a response.  Please remember that this is for non-urgent requests.  _______________________________________________________  We have sent the following medications to your pharmacy for you to pick up at your convenience: CONTINUE: Omeprazole '40mg'$  one capsule twice daily.  You have been scheduled for an endoscopy. Please follow written instructions given to you at your visit today. If you use inhalers (even only as needed), please bring them with you on the day of your procedure.  Due to recent changes in healthcare laws, you may see the results of your imaging and laboratory studies on MyChart before your provider has had a chance to review them.  We understand that in some cases there may be results that are confusing or concerning to you. Not all laboratory results come back in the same time frame and the provider may be waiting for multiple results in order to interpret others.  Please give [place] 48 hours in order for your provider to thoroughly review all the results before contacting the office for clarification of your results.   Thank you for entrusting me with your care and choosing [facility].  Dr Lorenso Courier

## 2022-05-19 NOTE — Progress Notes (Signed)
Chief Complaint:  swallowing problems   Assessment &  Plan   Dysphagia EoE Prominent CP bar GERD Patient presents with dysphagia that has improved significantly after dilation in 03/2022. Her esophageal biopsies at that time did show EoE. She seems to be responding well to PPI BID therapy so this may be adequate to keep her EoE and GERD under control. Will refill her PPI for now and then plan for EGD in 1-2 months. If esophageal biopsies show no eosinophils at that time, then can wean her down to the lowest possible dose of PPI that she is able to tolerate Cont omeprazole 40 mg BID. Refilled EGD [facility] in 1-2 months  HPI   77 yo female with a PMH of of adenomatous colon polyps , chronic constipation , GERD , remote PUD, anxiety, depression, TIA, chronic headaches, osteoporosis, glaucoma, psoriasis, hyperlipidemia   See PMH /PSH for additional history   EGD 04/05/22: - Benign-appearing esophageal stenosis. Dilated. - White nummular lesions in esophageal mucosa. Biopsied. - LA Grade A esophagitis with no bleeding. - 2 cm hiatal hernia. - Erosive gastropathy with no bleeding and no stigmata of recent bleeding. Biopsied. - Duodenitis. Path: 1. Surgical [P], gastric bx ANTRAL AND OXYNTIC MUCOSA WITH MILD CHANGES OF REACTIVE GASTROPATHY. NO HELICOBACTER PYLORI IDENTIFIED. 2. Surgical [P], esophagus bx SQUAMOUS MUCOSA WITH INCREASED EOSINOPHILS. GREATER THAN 20 PER HIGH-POWER FIELD. UNREMARKABLE GASTRIC CARDIA MUCOSA.  Interval History:  She has felt good. She only had one episode of indigestion after she ate a couple of home fry potato chips. She ran out of the omeprazole 2 days ago, and after eating this morning bagel and banana, she does not feel quite right. Denies any dysphagia. She thinks that the dilation has helped the most with her dysphagia.   Labs:     Latest Ref Rng & Units 04/10/2022    2:41 PM 11/16/2020    2:31 PM 10/27/2020    9:12 AM  CBC  WBC 4.0 - 10.5 K/uL 7.5   7.9  6.0   Hemoglobin 12.0 - 15.0 g/dL 10.0  12.9  12.8   Hematocrit 36.0 - 46.0 % 30.5  38.2  37.8   Platelets 150.0 - 400.0 K/uL 350.0  376.0  346.0        Latest Ref Rng & Units 01/24/2021    9:49 AM 11/16/2020    2:31 PM 10/27/2020    9:12 AM  Hepatic Function  Total Protein 6.0 - 8.3 g/dL 6.5  6.8  6.7   Albumin 3.5 - 5.2 g/dL 4.4  4.6  4.5   AST 0 - 37 U/L '18  19  17   '$ ALT 0 - 35 U/L '14  16  16   '$ Alk Phosphatase 39 - 117 U/L 116  139  126   Total Bilirubin 0.2 - 1.2 mg/dL 0.6  0.5  0.5   Bilirubin, Direct 0.0 - 0.3 mg/dL 0.1        Past Medical History:  Diagnosis Date   Allergy    Anxiety    Arthritis    Blood transfusion without reported diagnosis    Constipation, chronic    DEGENERATIVE JOINT DISEASE 06/02/2010   Depression    Glaucoma 05/15/2012   Right eye   Headache(784.0)    Hyperlipidemia    hx of   Osteopenia    Psoriasis    PUD (peptic ulcer disease) 05/15/2010   Upper GI bleed d/t NSAIDs   RLS (restless legs syndrome)  robaxin prn   TIA (transient ischemic attack) 09/2019    Past Surgical History:  Procedure Laterality Date   ANKLE FRACTURE SURGERY Left    surgery 12-11 (LEFT)   CESAREAN SECTION  05/16/1967   COLONOSCOPY  12/17/2013   TOTAL KNEE ARTHROPLASTY 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     Current Outpatient Medications  Medication Sig Dispense Refill   aspirin EC 81 MG tablet Take 81 mg by mouth daily. Swallow whole.     buPROPion (WELLBUTRIN XL) 300 MG 24 hr tablet Take 1 tablet (300 mg total) by mouth daily. 90 tablet 1   cetirizine (ZYRTEC) 10 MG tablet Take 10 mg by mouth at bedtime as needed for allergies.     ezetimibe (ZETIA) 10 MG tablet Take 1 tablet (10 mg total) by mouth daily. 90 tablet 1   gabapentin (NEURONTIN) 100 MG capsule TAKE ONE CAPSULE BY MOUTH AT BEDTIME 30 capsule 5   methocarbamol (ROBAXIN) 500 MG  tablet TAKE ONE (1) TABLET BY MOUTH 3 TIMES DAILY AS NEEDED FOR MUSCLE SPASMS 90 tablet 1   omeprazole (PRILOSEC) 40 MG capsule Take 1 capsule (40 mg total) by mouth daily. 60 capsule 0   propranolol (INDERAL) 10 MG tablet Take 1 tablet (10 mg total) by mouth 3 (three) times daily. 90 tablet 2   sertraline (ZOLOFT) 100 MG tablet TAKE TWO TABLETS BY MOUTH DAILY 180 tablet 1   Travoprost, BAK Free, (TRAVATAN) 0.004 % SOLN ophthalmic solution Place 1 drop into the right eye at bedtime.     zolpidem (AMBIEN) 5 MG tablet Take 1 tablet (5 mg total) by mouth at bedtime as needed for sleep. 30 tablet 1   amoxicillin (AMOXIL) 500 MG capsule SMARTSIG:4 Capsule(s) By Mouth Once (Patient not taking: Reported on 05/19/2022)     No current facility-administered medications for this visit.    Review of Systems: No chest pain. No shortness of breath. No urinary complaints.    Physical Exam  Wt Readings from Last 3 Encounters:  05/19/22 169 lb (76.7 kg)  04/10/22 166 lb (75.3 kg)  04/05/22 165 lb (74.8 kg)    BP 120/80   Pulse 69   Ht '5\' 4"'$  (1.626 m)   Wt 169 lb (76.7 kg)   BMI 29.01 kg/m  Constitutional:  Generally well appearing female in no acute distress. Psychiatric: Pleasant. Normal mood and affect. Behavior is normal. EENT: Pupils normal.  Conjunctivae are normal. No scleral icterus. Neck supple.  Cardiovascular: Normal rate, regular rhythm. No edema Pulmonary/chest: Effort normal and breath sounds normal. No wheezing, rales or rhonchi. Abdominal: Soft, nondistended, nontender. Bowel sounds active throughout. There are no masses palpable. No hepatomegaly. Neurological: Alert and oriented to person place and time. Skin: Skin is warm and dry. No rashes noted.  Sharyn Creamer, NP  05/19/2022, 10:21 AM   I spent 33 minutes of time, including in depth chart review, independent review of results as outlined above, communicating results with the patient directly, face-to-face time with the  patient, coordinating care, and ordering studies and medications as appropriate, and documentation.

## 2022-05-23 DIAGNOSIS — R2689 Other abnormalities of gait and mobility: Secondary | ICD-10-CM | POA: Diagnosis not present

## 2022-05-23 DIAGNOSIS — M6281 Muscle weakness (generalized): Secondary | ICD-10-CM | POA: Diagnosis not present

## 2022-05-23 DIAGNOSIS — Z96652 Presence of left artificial knee joint: Secondary | ICD-10-CM | POA: Diagnosis not present

## 2022-05-23 DIAGNOSIS — Z4789 Encounter for other orthopedic aftercare: Secondary | ICD-10-CM | POA: Diagnosis not present

## 2022-05-25 DIAGNOSIS — M6281 Muscle weakness (generalized): Secondary | ICD-10-CM | POA: Diagnosis not present

## 2022-05-25 DIAGNOSIS — Z4789 Encounter for other orthopedic aftercare: Secondary | ICD-10-CM | POA: Diagnosis not present

## 2022-05-25 DIAGNOSIS — Z96652 Presence of left artificial knee joint: Secondary | ICD-10-CM | POA: Diagnosis not present

## 2022-05-25 DIAGNOSIS — R2689 Other abnormalities of gait and mobility: Secondary | ICD-10-CM | POA: Diagnosis not present

## 2022-05-26 ENCOUNTER — Encounter: Payer: Medicare PPO | Admitting: Internal Medicine

## 2022-05-29 NOTE — Telephone Encounter (Signed)
VOB submitted for 2024. Will need to f/u with patient and Dr. Raeford Razor to see if Prolia should still be pursued.

## 2022-05-30 DIAGNOSIS — Z4789 Encounter for other orthopedic aftercare: Secondary | ICD-10-CM | POA: Diagnosis not present

## 2022-05-30 DIAGNOSIS — Z96652 Presence of left artificial knee joint: Secondary | ICD-10-CM | POA: Diagnosis not present

## 2022-05-30 DIAGNOSIS — M6281 Muscle weakness (generalized): Secondary | ICD-10-CM | POA: Diagnosis not present

## 2022-05-30 DIAGNOSIS — R2689 Other abnormalities of gait and mobility: Secondary | ICD-10-CM | POA: Diagnosis not present

## 2022-05-31 ENCOUNTER — Telehealth: Payer: Self-pay | Admitting: Internal Medicine

## 2022-05-31 NOTE — Telephone Encounter (Signed)
Copied from [place] [phone]. Topic: Medicare AWV >> May 31, 2022 10:44 AM Devoria Glassing wrote: Reason for CRM: Left message for patient to schedule Annual Wellness Visit.  Please schedule with Health Nurse Advisor at [facility]. Call [place] at [phone]

## 2022-06-01 DIAGNOSIS — M6281 Muscle weakness (generalized): Secondary | ICD-10-CM | POA: Diagnosis not present

## 2022-06-01 DIAGNOSIS — Z96652 Presence of left artificial knee joint: Secondary | ICD-10-CM | POA: Diagnosis not present

## 2022-06-01 DIAGNOSIS — R2689 Other abnormalities of gait and mobility: Secondary | ICD-10-CM | POA: Diagnosis not present

## 2022-06-01 DIAGNOSIS — Z4789 Encounter for other orthopedic aftercare: Secondary | ICD-10-CM | POA: Diagnosis not present

## 2022-06-05 NOTE — Telephone Encounter (Signed)
Prior Authorization renewal initiated for PROLIA via [email or web address] PA# [id number], KEY: [id number]

## 2022-06-05 NOTE — Telephone Encounter (Signed)
Prior auth required for PROLIA ? ?PA PROCESS DETAILS: PA is required. PA can be initiated by calling [phone] or online at ?[email or web address] ? ?

## 2022-06-06 DIAGNOSIS — R2689 Other abnormalities of gait and mobility: Secondary | ICD-10-CM | POA: Diagnosis not present

## 2022-06-06 DIAGNOSIS — Z4789 Encounter for other orthopedic aftercare: Secondary | ICD-10-CM | POA: Diagnosis not present

## 2022-06-06 DIAGNOSIS — M6281 Muscle weakness (generalized): Secondary | ICD-10-CM | POA: Diagnosis not present

## 2022-06-06 DIAGNOSIS — Z96652 Presence of left artificial knee joint: Secondary | ICD-10-CM | POA: Diagnosis not present

## 2022-06-06 NOTE — Telephone Encounter (Signed)
Prolia has been approved for the following dates: 09/01/2020 to 05/14/2022.

## 2022-06-08 NOTE — Telephone Encounter (Addendum)
Obtaining prior auth for 2024 for PROLIA    Patient Cost Estimator via [employer]ity

## 2022-06-12 ENCOUNTER — Telehealth: Payer: Self-pay | Admitting: Internal Medicine

## 2022-06-12 NOTE — Telephone Encounter (Signed)
Left message for patient to call back  

## 2022-06-12 NOTE — Telephone Encounter (Signed)
Copied from [place] [phone]. Topic: Medicare AWV >> Jun 12, 2022  2:06 PM Devoria Glassing wrote: Reason for CRM: Left message for patient to schedule Annual Wellness Visit(AWV).  Please schedule with Health Nurse Advisor at [facility]. Please call [phone] ask for [facility].

## 2022-06-12 NOTE — Telephone Encounter (Signed)
Approved services: [place] [id number] has been approved for the following dates: 05/15/2022 to 05/15/2023.

## 2022-06-12 NOTE — Telephone Encounter (Signed)
Pt ready for scheduling on or after 06/12/22  Out-of-pocket cost due at time of visit: $0  Primary: [employer] [place] Prolia co-insurance: 0% Admin fee co-insurance: 0%   Secondary: n/a Prolia co-insurance:  Admin fee co-insurance:   Deductible: does not apply  Prior Auth: APPROVED PA#: [id number] Valid: 05/15/22-05/15/23    ** This summary of benefits is an estimation of the patient's out-of-pocket cost. Exact cost may very based on individual plan coverage.

## 2022-06-12 NOTE — Telephone Encounter (Signed)
APPROVED PA#: [id number] Valid: 05/15/22-05/15/23

## 2022-06-13 DIAGNOSIS — R2689 Other abnormalities of gait and mobility: Secondary | ICD-10-CM | POA: Diagnosis not present

## 2022-06-13 DIAGNOSIS — Z4789 Encounter for other orthopedic aftercare: Secondary | ICD-10-CM | POA: Diagnosis not present

## 2022-06-13 DIAGNOSIS — M6281 Muscle weakness (generalized): Secondary | ICD-10-CM | POA: Diagnosis not present

## 2022-06-13 DIAGNOSIS — Z96652 Presence of left artificial knee joint: Secondary | ICD-10-CM | POA: Diagnosis not present

## 2022-06-15 DIAGNOSIS — R2689 Other abnormalities of gait and mobility: Secondary | ICD-10-CM | POA: Diagnosis not present

## 2022-06-15 DIAGNOSIS — M6281 Muscle weakness (generalized): Secondary | ICD-10-CM | POA: Diagnosis not present

## 2022-06-15 DIAGNOSIS — Z96652 Presence of left artificial knee joint: Secondary | ICD-10-CM | POA: Diagnosis not present

## 2022-06-15 DIAGNOSIS — Z4789 Encounter for other orthopedic aftercare: Secondary | ICD-10-CM | POA: Diagnosis not present

## 2022-06-15 NOTE — Progress Notes (Signed)
NEUROLOGY FOLLOW UP OFFICE NOTE  Cynthia [name] [id number]  Assessment/Plan:   1.  Cervicogenic headache with cervical spondylosis 2.  Migraine without aura, without status migrainosus, not intractable - improved  4.  Transient left hemisensory loss.  TIA vs migraine.  As no prior history, must assume TIA. 5.  Small Acomm aneurysm   1.  Continue gabapentin '100mg'$  at bedtime 2.  Refer to physical therapy for treatment of cervicalgia/cervicogenic headache.   3.  Repeat MRA of head to follow up on aneurysm 5.  Follow up 9 months.   Subjective:  Cynthia [name] is a 77 year old right-handed woman with depression, glaucoma, degenerative joint disease and history of TIA who follows up for headaches   UPDATE: Migraines are controlled. Intensity:  moderate Duration:  3 to 4 hours.  Frequency:  Last migraine several months ago. Still with the posterior headache.     She tried a dose of Emgality which aborted the headache.  She has not taken any subsequent doses but she has had only on migraine since last summer.    She continues to have cervicogenic headaches almost daily.  It is moderate but aborts quickly after an hour once she gets out of bed, moves around and applies heat to back of her neck.     Current NSAIDS:  ASA '81mg'$  daily Current analgesics:  cetaminophen-caffeine Current triptans:  none Current ergotamine:  no Current anti-emetic: Zofran Current muscle relaxants:  Robaxin '500mg'$  TID (rarely) Current anti-anxiolytic:  no Current sleep aide:  Ambien Current Antihypertensive medications:  verapamil CR '180mg'$  Current Antidepressant medications:  Sertraline '100mg'$ , bupropion '300mg'$  Current Anticonvulsant medications:  gabapentin '100mg'$  at bedtime Current anti-CGRP: none Current Vitamins/Herbal/Supplements:  B complex, Mg Current Antihistamines/Decongestants:  Mucinex, Astelin Other therapy: 1 cup black coffee with melatonin '10mg'$  at bedtime Other medication: Zetia   No  subsequent stroke-like symptoms.   Depression and anxiety: Yes Other pain: Joint pain   HISTORY:  She started having headaches infrequently in 2015 but progressively became more frequent in 2016.  Varies, but they are usually left sided, involving the ear, but also may be right sided (involving the sinuses) or in band-like distribution.  It is both throbbing and non-throbbing.  Initially, it is usually 5/10 but severe episodes are 7-8/10.  Sometimes there is nausea.  There is phonophobia.  There is no associated photophobia, visual disturbance or unilateral numbness or weakness.  Initially, it lasts a couple of hours and occuring 15 days out of the month (3 to 4 days severe).  It wakes her up at 4:30 to 5 AM.  She reports sensation of congestion but no runny nose.  CT of sinuses showed opacified right sphenoid sinus and ethmoid air cell.  She was treated a couple of times with Augmentin and prednisone, which was effective for a while but headaches then returned.  She cannot think of a trigger.  They are worse during the summer.  There are no aggravating or relieving factors.  MRI of brain from 06/10/15 was normal.  Sed Rate from 12/27/15 was 10.     She was admitted to [facility] on 09/14/2019 for TIA presenting as transient left sided hemibody numbness (first leg, then arm, then face).  Mostly resolved quickly but lingered for several hours.  CT head showed right internal capsule hypodensity but follow up MRI of brain showed no acute infarct.  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  2D echo showed EF 60-65% with  no source of embolus.  LDL was 162 and Hgb A1c was 5.2.  She was started on ASA '81mg'$  daily and Plavix '75mg'$  for 3 weeks followed by ASA alone.  The next morning she had a habitual migraine headache except it lasted all day.  She was also started on Zetia (she has statin intolerance).  Unclear if event was TIA or complicated migraine.   Due to worsening headache, ordered an MRA of head  .  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   She notes another headache across the back of her head when she lays down.  She does have neck pain with cervical spondylosis.  Around December 2022, she started experiencing severe paroxysmal ear pain, mostly right ear but also left ear, every other day.  No aural fullness.  No change in tinnitus.  Hearing slightly worse but not significant.  Sometimes jaw pain. Cervical X-ray from 08/23/2021 revealed multilevel degenerative changes with mild degenerative changes with mild degenerative anterolisthesis of C3 on C4 and disc space narrowing with osteophytic changes at C5-6 and C6-7 but the odontoid was within normal limits.  Chiropractic therapy and acupuncture were ineffective.     Past NSAIDS:  ibuprofen (ulcers) Past analgesics:  Tylenol, tramadol Past abortive triptans:  sumatriptan (advised to discontinue due to age) Past abortive ergotamine:  no Past muscle relaxants:  no Past anti-emetic:  no Past antihypertensive medications:  no Past antidepressant medications:  Nortriptyline '50mg'$  Past anticonvulsant medications:  topiramate '25mg'$  (concerned about glaucoma) Past anti-CGRP:  Emgality  Past vitamins/Herbal/Supplements:  no Past antihistamines/decongestants:  no Other past therapies:  none  PAST MEDICAL HISTORY: Past Medical History:  Diagnosis Date   Allergy    Anxiety    Arthritis    Blood transfusion without reported diagnosis    Constipation, chronic    DEGENERATIVE JOINT DISEASE 06/02/2010   Depression    Glaucoma 05/15/2012   Right eye   Headache(784.0)    Hyperlipidemia    hx of   Osteopenia    Psoriasis    PUD (peptic ulcer disease) 05/15/2010   Upper GI bleed d/t NSAIDs   RLS (restless legs syndrome)    robaxin prn   TIA (transient ischemic attack) 09/2019    MEDICATIONS: Current Outpatient Medications on File Prior to Visit  Medication Sig  Dispense Refill   aspirin EC 81 MG tablet Take 81 mg by mouth daily. Swallow whole.     buPROPion (WELLBUTRIN XL) 300 MG 24 hr tablet TAKE ONE (1) TABLET BY MOUTH EVERY DAY 90 tablet 1   cetirizine (ZYRTEC) 10 MG tablet Take 10 mg by mouth at bedtime as needed for allergies.     EMGALITY 120 MG/ML SOAJ INJECT 120 MILLIGRAMS INTO THE SKIN EVERY 28 DAYS 1 mL 4   ezetimibe (ZETIA) 10 MG tablet TAKE ONE (1) TABLET BY MOUTH EVERY DAY 90 tablet 1   gabapentin (NEURONTIN) 100 MG capsule Take 1 capsule (100 mg total) by mouth at bedtime. 30 capsule 5   methocarbamol (ROBAXIN) 500 MG tablet TAKE ONE (1) TABLET BY MOUTH 3 TIMES DAILY AS NEEDED FOR MUSCLE SPASMS 90 tablet 1   sertraline (ZOLOFT) 100 MG tablet TAKE TWO TABLETS BY MOUTH DAILY 180 tablet 1   Travoprost, BAK Free, (TRAVATAN) 0.004 % SOLN ophthalmic solution Place 1 drop into the right eye at bedtime.     zolpidem (AMBIEN) 5 MG tablet Take 1 tablet (5 mg total) by  mouth at bedtime as needed for sleep. 30 tablet 1   No current facility-administered medications on file prior to visit.    ALLERGIES: Allergies  Allergen Reactions   Bee Pollen Other (See Comments)   Motrin [Ibuprofen]     GI bleed   Pollen Extract    Statins     "delibitating pain all over body"   Sulfonamide Derivatives     Pt unsure of reaction   Ciprofloxacin Swelling and Rash    FAMILY HISTORY: Family History  Problem Relation Age of Onset   Aneurysm Mother        brain   Celiac disease Brother    Diabetes Maternal Grandfather    Hypertension Neg Hx    Breast cancer Neg Hx    Coronary artery disease Neg Hx    Colon cancer Neg Hx    Esophageal cancer Neg Hx    Stomach cancer Neg Hx    Rectal cancer Neg Hx       Objective:  Blood pressure 122/81, pulse 74, resp. rate 18, height '5\' 4"'$  (1.626 m), weight 167 lb (75.8 kg), SpO2 98 %. General: No acute distress.  Patient appears well-groomed.   Head:  Normocephalic/atraumatic Eyes:  Fundi examined but not  visualized Neck: supple, no paraspinal tenderness, full range of motion Heart:  Regular rate and rhythm Neurological Exam: alert and oriented to person, place, and time.  Speech fluent and not dysarthric, language intact.  CN II-XII intact. Bulk and tone normal, muscle strength 5/5 throughout.  Sensation to light touch intact.  Deep tendon reflexes 2+ throughout.  Finger to nose testing intact.  Gait normal, Romberg negative.   Metta Clines, DO  CC: Kathlene November, MD

## 2022-06-16 ENCOUNTER — Ambulatory Visit: Payer: Medicare PPO | Admitting: Neurology

## 2022-06-16 ENCOUNTER — Encounter: Payer: Self-pay | Admitting: Neurology

## 2022-06-16 VITALS — BP 122/81 | HR 74 | Resp 18 | Ht 64.0 in | Wt 167.0 lb

## 2022-06-16 DIAGNOSIS — G43009 Migraine without aura, not intractable, without status migrainosus: Secondary | ICD-10-CM

## 2022-06-16 DIAGNOSIS — I671 Cerebral aneurysm, nonruptured: Secondary | ICD-10-CM | POA: Diagnosis not present

## 2022-06-16 DIAGNOSIS — M47812 Spondylosis without myelopathy or radiculopathy, cervical region: Secondary | ICD-10-CM

## 2022-06-16 DIAGNOSIS — G4486 Cervicogenic headache: Secondary | ICD-10-CM

## 2022-06-16 MED ORDER — GABAPENTIN 100 MG PO CAPS: 100.0000 mg | ORAL_CAPSULE | Freq: Every day | ORAL | 5 refills | Status: DC

## 2022-06-16 NOTE — Patient Instructions (Signed)
Refilled gabapentin Refer to physical therapy Follow up 9 months.

## 2022-06-19 ENCOUNTER — Ambulatory Visit (INDEPENDENT_AMBULATORY_CARE_PROVIDER_SITE_OTHER): Payer: Medicare PPO

## 2022-06-19 DIAGNOSIS — M8000XG Age-related osteoporosis with current pathological fracture, unspecified site, subsequent encounter for fracture with delayed healing: Secondary | ICD-10-CM | POA: Diagnosis not present

## 2022-06-19 MED ORDER — DENOSUMAB 60 MG/ML ~~LOC~~ SOSY
60.0000 mg | PREFILLED_SYRINGE | Freq: Once | SUBCUTANEOUS | Status: AC
  Administered 2022-06-19: 60 mg via SUBCUTANEOUS

## 2022-06-19 NOTE — Telephone Encounter (Signed)
Pt informed of below.  Nurse visit scheduled 06/19/22 @ 4:10 pm.

## 2022-06-19 NOTE — Progress Notes (Signed)
Patient is here for nurse visit for her Prolia. She received Prolia 60 mg [place] injection in her left arm. She will call us with any adverse events/reactions and she will return in 6 months for her next Prolia.

## 2022-06-19 NOTE — Telephone Encounter (Signed)
Left message for patient to call back to see if patient wants to come in to office to get her Prolia in office or if she is getting it somewhere else.

## 2022-06-20 DIAGNOSIS — R2689 Other abnormalities of gait and mobility: Secondary | ICD-10-CM | POA: Diagnosis not present

## 2022-06-20 DIAGNOSIS — Z4789 Encounter for other orthopedic aftercare: Secondary | ICD-10-CM | POA: Diagnosis not present

## 2022-06-20 DIAGNOSIS — Z96652 Presence of left artificial knee joint: Secondary | ICD-10-CM | POA: Diagnosis not present

## 2022-06-20 DIAGNOSIS — M6281 Muscle weakness (generalized): Secondary | ICD-10-CM | POA: Diagnosis not present

## 2022-06-21 ENCOUNTER — Other Ambulatory Visit: Payer: Self-pay | Admitting: Internal Medicine

## 2022-06-21 ENCOUNTER — Encounter: Payer: Self-pay | Admitting: Internal Medicine

## 2022-06-22 DIAGNOSIS — Z96652 Presence of left artificial knee joint: Secondary | ICD-10-CM | POA: Diagnosis not present

## 2022-06-22 DIAGNOSIS — R2689 Other abnormalities of gait and mobility: Secondary | ICD-10-CM | POA: Diagnosis not present

## 2022-06-22 DIAGNOSIS — Z4789 Encounter for other orthopedic aftercare: Secondary | ICD-10-CM | POA: Diagnosis not present

## 2022-06-22 DIAGNOSIS — M6281 Muscle weakness (generalized): Secondary | ICD-10-CM | POA: Diagnosis not present

## 2022-06-27 DIAGNOSIS — Z4789 Encounter for other orthopedic aftercare: Secondary | ICD-10-CM | POA: Diagnosis not present

## 2022-06-27 DIAGNOSIS — M6281 Muscle weakness (generalized): Secondary | ICD-10-CM | POA: Diagnosis not present

## 2022-06-27 DIAGNOSIS — Z96652 Presence of left artificial knee joint: Secondary | ICD-10-CM | POA: Diagnosis not present

## 2022-06-27 DIAGNOSIS — R2689 Other abnormalities of gait and mobility: Secondary | ICD-10-CM | POA: Diagnosis not present

## 2022-06-28 ENCOUNTER — Encounter: Payer: Self-pay | Admitting: Neurology

## 2022-06-29 ENCOUNTER — Ambulatory Visit
Admission: RE | Admit: 2022-06-29 | Discharge: 2022-06-29 | Disposition: A | Discharge status: Home / Self Care | Payer: Medicare PPO | Source: Ambulatory Visit | Attending: Neurology | Admitting: Neurology

## 2022-06-29 DIAGNOSIS — I671 Cerebral aneurysm, nonruptured: Secondary | ICD-10-CM | POA: Diagnosis not present

## 2022-07-04 NOTE — Telephone Encounter (Signed)
Last Prolia inj 06/19/22 Next Prolia inj due 12/19/22

## 2022-07-07 ENCOUNTER — Encounter: Payer: Medicare PPO | Admitting: Internal Medicine

## 2022-07-11 DIAGNOSIS — M6281 Muscle weakness (generalized): Secondary | ICD-10-CM | POA: Diagnosis not present

## 2022-07-11 DIAGNOSIS — Z96652 Presence of left artificial knee joint: Secondary | ICD-10-CM | POA: Diagnosis not present

## 2022-07-11 DIAGNOSIS — R2689 Other abnormalities of gait and mobility: Secondary | ICD-10-CM | POA: Diagnosis not present

## 2022-07-11 DIAGNOSIS — Z4789 Encounter for other orthopedic aftercare: Secondary | ICD-10-CM | POA: Diagnosis not present

## 2022-07-12 ENCOUNTER — Ambulatory Visit: Payer: Medicare PPO | Admitting: Medical

## 2022-07-13 ENCOUNTER — Telehealth: Payer: Self-pay | Admitting: Family

## 2022-07-13 ENCOUNTER — Other Ambulatory Visit: Payer: Self-pay | Admitting: Family

## 2022-07-13 ENCOUNTER — Ambulatory Visit: Payer: Medicare PPO | Admitting: Family

## 2022-07-13 ENCOUNTER — Encounter: Payer: Self-pay | Admitting: Family

## 2022-07-13 VITALS — BP 124/68 | HR 76 | Resp 17 | Ht 64.0 in | Wt 167.8 lb

## 2022-07-13 DIAGNOSIS — H6690 Otitis media, unspecified, unspecified ear: Secondary | ICD-10-CM

## 2022-07-13 DIAGNOSIS — K120 Recurrent oral aphthae: Secondary | ICD-10-CM

## 2022-07-13 MED ORDER — NYSTATIN 100000 UNIT/ML MT SUSP: 5.0000 mL | Freq: Four times a day (QID) | OROMUCOSAL | 1 refills | Status: AC

## 2022-07-13 MED ORDER — AMOXICILLIN-POT CLAVULANATE 875-125 MG PO TABS: 1.0000 | ORAL_TABLET | Freq: Two times a day (BID) | ORAL | 0 refills | Status: DC

## 2022-07-13 MED ORDER — AMOXICILLIN-POT CLAVULANATE 875-125 MG PO TABS: 1.0000 | ORAL_TABLET | Freq: Two times a day (BID) | ORAL | 0 refills | Status: AC

## 2022-07-13 NOTE — Telephone Encounter (Signed)
Rx called directly to pharmacy; spoke with pharmacy staff to update Rx;

## 2022-07-13 NOTE — Telephone Encounter (Signed)
Pt states the pharmacy told her they can received medications via fax. She needs the prescriptions resent. Please advise.

## 2022-07-13 NOTE — Progress Notes (Signed)
Cynthia [name] is a 77 y.o. female with the following history as recorded in [employer]:  Patient Active Problem List   Diagnosis Date Noted   Sacroiliac joint dysfunction of both sides 08/23/2021   Vaginal discharge 03/28/2021   Stress reaction of bone 11/09/2020   Bone marrow edema 04/12/2020   OA (osteoarthritis) of knee 03/04/2020   Psoriasis 12/19/2019   TIA (transient ischemic attack) 09/14/2019   Insomnia 05/19/2019   Gastroesophageal reflux disease without esophagitis 05/02/2019   Essential tremor [id number]   Lichen A999333   PCP NOTES >>>>>>>>>>>>>>>>>>>>>>>>>>>>>>>>>>> 03/25/2015   H/O adenomatous polyp of colon 05/18/2014   Annual physical exam 03/13/2011   Osteoporosis    Osteoarthritis 06/02/2010   Hyperlipidemia 11/09/2009   Migraine headache 06/28/2009   CONSTIPATION, CHRONIC 02/01/2007   Anxiety and depression 08/17/2006   ALLERGIC RHINITIS 08/17/2006    Current Outpatient Medications  Medication Sig Dispense Refill   amoxicillin (AMOXIL) 500 MG capsule      amoxicillin-clavulanate (AUGMENTIN) 875-125 MG tablet Take 1 tablet by mouth 2 (two) times daily for 7 days. 14 tablet 0   aspirin EC 81 MG tablet Take 81 mg by mouth daily. Swallow whole.     buPROPion (WELLBUTRIN XL) 300 MG 24 hr tablet Take 1 tablet (300 mg total) by mouth daily. 90 tablet 1   cetirizine (ZYRTEC) 10 MG tablet Take 10 mg by mouth at bedtime as needed for allergies.     ezetimibe (ZETIA) 10 MG tablet Take 1 tablet (10 mg total) by mouth daily. 90 tablet 1   gabapentin (NEURONTIN) 100 MG capsule Take 1 capsule (100 mg total) by mouth at bedtime. 30 capsule 5   methocarbamol (ROBAXIN) 500 MG tablet TAKE ONE (1) TABLET BY MOUTH 3 TIMES DAILY AS NEEDED FOR MUSCLE SPASMS 90 tablet 1   nystatin (MYCOSTATIN) 100000 UNIT/ML suspension Take 5 mLs (500,000 Units total) by mouth 4 (four) times daily. 120 mL 1   omeprazole (PRILOSEC) 40 MG capsule Take 1 capsule (40 mg total) by mouth 2 (two) times  daily. 60 capsule 2   propranolol (INDERAL) 10 MG tablet Take 1 tablet (10 mg total) by mouth 3 (three) times daily. 90 tablet 2   sertraline (ZOLOFT) 100 MG tablet TAKE TWO TABLETS BY MOUTH DAILY 180 tablet 1   Travoprost, BAK Free, (TRAVATAN) 0.004 % SOLN ophthalmic solution Place 1 drop into the right eye at bedtime.     zolpidem (AMBIEN) 5 MG tablet Take 1 tablet (5 mg total) by mouth at bedtime as needed for sleep. 30 tablet 1   No current facility-administered medications for this visit.    Allergies: Bee pollen, Motrin [ibuprofen], Pollen extract, Statins, Sulfonamide derivatives, and Ciprofloxacin  Past Medical History:  Diagnosis Date   Allergy    Anxiety    Arthritis    Blood transfusion without reported diagnosis    Constipation, chronic    DEGENERATIVE JOINT DISEASE 06/02/2010   Depression    Glaucoma 05/15/2012   Right eye   Headache(784.0)    Hyperlipidemia    hx of   Osteopenia    Psoriasis    PUD (peptic ulcer disease) 05/15/2010   Upper GI bleed d/t NSAIDs   RLS (restless legs syndrome)    robaxin prn   TIA (transient ischemic attack) 09/2019    Past Surgical History:  Procedure Laterality Date   ANKLE FRACTURE SURGERY Left    surgery 12-11 (LEFT)   CESAREAN SECTION  05/16/1967   COLONOSCOPY  12/17/2013  TOTAL KNEE ARTHROPLASTY Left 12/28/2021    Family History  Problem Relation Age of Onset   Aneurysm Mother        brain   Celiac disease Brother    Diabetes Maternal Grandfather    Hypertension Neg Hx    Breast cancer Neg Hx    Coronary artery disease Neg Hx    Colon cancer Neg Hx    Esophageal cancer Neg Hx    Stomach cancer Neg Hx    Rectal cancer Neg Hx     Social History   Tobacco Use   Smoking status: Never    Passive exposure: Never   Smokeless tobacco: Never  Substance Use Topics   Alcohol use: Yes    Comment: socially    Subjective:   Right ear pain x 1 week; has noticed swollen lymph nodes on right side of neck as well; has  also been struggling with "sore" on right side of tongue- is prone to recurrent canker sores; would like to get a refill on Oral Nystatin solution which she has used with benefit in the past for similar symptoms;   Objective:  Vitals:   07/13/22 1357  BP: 124/68  Pulse: 76  Resp: 17  SpO2: 99%  Weight: 167 lb 12.8 oz (76.1 kg)  Height: '5\' 4"'$  (1.626 m)    General: Well developed, well nourished, in no acute distress  Skin : Warm and dry.  Head: Normocephalic and atraumatic  Eyes: Sclera and conjunctiva clear; pupils round and reactive to light; extraocular movements intact  Ears: External normal; canals clear; right tympanic membranes mildly erythematous Oropharynx: Pink, supple. No suspicious lesions ; canker sore noted on right side of tongue Neck: Supple without thyromegaly, adenopathy  Lungs: Respirations unlabored; clear to auscultation bilaterally without wheeze, rales, rhonchi  CVS exam: normal rate and regular rhythm.  Neurologic: Alert and oriented; speech intact; face symmetrical; moves all extremities well; CNII-XII intact without focal deficit   Assessment:  1. Acute otitis media, unspecified otitis media type   2. Canker sore     Plan:  Rx for Augmentin and oral Nystatin; increase fluids, rest and follow up worse, no better.   No follow-ups on file.  No orders of the defined types were placed in this encounter.   Requested Prescriptions   Signed Prescriptions Disp Refills   nystatin (MYCOSTATIN) 100000 UNIT/ML suspension 120 mL 1    Sig: Take 5 mLs (500,000 Units total) by mouth 4 (four) times daily.   amoxicillin-clavulanate (AUGMENTIN) 875-125 MG tablet 14 tablet 0    Sig: Take 1 tablet by mouth 2 (two) times daily for 7 days.

## 2022-07-18 ENCOUNTER — Encounter: Payer: Self-pay | Admitting: Internal Medicine

## 2022-07-18 ENCOUNTER — Encounter (INDEPENDENT_AMBULATORY_CARE_PROVIDER_SITE_OTHER): Payer: Self-pay

## 2022-07-18 ENCOUNTER — Ambulatory Visit (AMBULATORY_SURGERY_CENTER): Payer: Medicare PPO

## 2022-07-18 VITALS — Ht 64.0 in | Wt 166.0 lb

## 2022-07-18 DIAGNOSIS — R1310 Dysphagia, unspecified: Secondary | ICD-10-CM

## 2022-07-18 DIAGNOSIS — K449 Diaphragmatic hernia without obstruction or gangrene: Secondary | ICD-10-CM

## 2022-07-18 DIAGNOSIS — K219 Gastro-esophageal reflux disease without esophagitis: Secondary | ICD-10-CM

## 2022-07-18 DIAGNOSIS — K259 Gastric ulcer, unspecified as acute or chronic, without hemorrhage or perforation: Secondary | ICD-10-CM

## 2022-07-18 NOTE — Progress Notes (Signed)
No egg or soy allergy known to patient  No issues known to pt with past sedation with any surgeries or procedures Patient denies ever being told they had issues or difficulty with intubation  No FH of Malignant Hyperthermia Pt is not on diet pills Pt is not on  home 02  Pt is not on blood thinners  Pt has issues with constipation  No A fib or A flutter Have any cardiac testing pending--no Pt instructed to use [email or web address] or GoodRx for a price reduction on prep

## 2022-07-19 ENCOUNTER — Ambulatory Visit (INDEPENDENT_AMBULATORY_CARE_PROVIDER_SITE_OTHER): Payer: Medicare PPO | Admitting: Internal Medicine

## 2022-07-19 ENCOUNTER — Encounter: Payer: Self-pay | Admitting: Internal Medicine

## 2022-07-19 ENCOUNTER — Other Ambulatory Visit (HOSPITAL_BASED_OUTPATIENT_CLINIC_OR_DEPARTMENT_OTHER): Payer: Self-pay

## 2022-07-19 VITALS — BP 134/84 | HR 75 | Temp 98.3°F | Resp 16 | Ht 64.0 in | Wt 165.5 lb

## 2022-07-19 DIAGNOSIS — F32A Depression, unspecified: Secondary | ICD-10-CM | POA: Diagnosis not present

## 2022-07-19 DIAGNOSIS — Z0000 Encounter for general adult medical examination without abnormal findings: Secondary | ICD-10-CM | POA: Diagnosis not present

## 2022-07-19 DIAGNOSIS — F419 Anxiety disorder, unspecified: Secondary | ICD-10-CM

## 2022-07-19 DIAGNOSIS — E785 Hyperlipidemia, unspecified: Secondary | ICD-10-CM

## 2022-07-19 DIAGNOSIS — Z1231 Encounter for screening mammogram for malignant neoplasm of breast: Secondary | ICD-10-CM | POA: Diagnosis not present

## 2022-07-19 LAB — LIPID PANEL
Cholesterol: 230 mg/dL — ABNORMAL HIGH (ref 0–200)
HDL: 57.1 mg/dL (ref >39.00)
LDL Cholesterol: 147 mg/dL — ABNORMAL HIGH (ref 0–99)
NonHDL: 173.35
Total CHOL/HDL Ratio: 4
Triglycerides: 131 mg/dL (ref 0.0–149.0)
VLDL: 26.2 mg/dL (ref 0.0–40.0)

## 2022-07-19 LAB — CBC WITH DIFFERENTIAL/PLATELET
Basophils Absolute: 0.1 10*3/uL (ref 0.0–0.1)
Basophils Relative: 1.2 % (ref 0.0–3.0)
Eosinophils Absolute: 0.6 10*3/uL (ref 0.0–0.7)
Eosinophils Relative: 8.9 % — ABNORMAL HIGH (ref 0.0–5.0)
HCT: 33.2 % — ABNORMAL LOW (ref 36.0–46.0)
Hemoglobin: 11.1 g/dL — ABNORMAL LOW (ref 12.0–15.0)
Lymphocytes Relative: 24.6 % (ref 12.0–46.0)
Lymphs Abs: 1.7 10*3/uL (ref 0.7–4.0)
MCHC: 33.4 g/dL (ref 30.0–36.0)
MCV: 84.6 fl (ref 78.0–100.0)
Monocytes Absolute: 0.8 10*3/uL (ref 0.1–1.0)
Monocytes Relative: 11.2 % (ref 3.0–12.0)
Neutro Abs: 3.7 10*3/uL (ref 1.4–7.7)
Neutrophils Relative %: 54.1 % (ref 43.0–77.0)
Platelets: 354 10*3/uL (ref 150.0–400.0)
RBC: 3.93 Mil/uL (ref 3.87–5.11)
RDW: 15.9 % — ABNORMAL HIGH (ref 11.5–15.5)
WBC: 6.8 10*3/uL (ref 4.0–10.5)

## 2022-07-19 LAB — ALT: ALT: 12 U/L (ref 0–35)

## 2022-07-19 LAB — AST: AST: 20 U/L (ref 0–37)

## 2022-07-19 MED ORDER — AREXVY 120 MCG/0.5ML IM SUSR
INTRAMUSCULAR | 0 refills | Status: DC
  Filled 2022-07-19: qty 0.5, 1d supply, fill #0

## 2022-07-19 MED ORDER — KETOCONAZOLE 2 % EX CREA: 1.0000 | TOPICAL_CREAM | Freq: Every day | CUTANEOUS | 0 refills | Status: DC

## 2022-07-19 NOTE — Progress Notes (Unsigned)
Subjective:    Patient ID: Cynthia [name], female    DOB: [birth year], 77 y.o.   MRN: [id number]  DOS:  07/19/2022 Type of visit - description: CPX Here for CPX. Chronic medical problems were discussed. Reports bilateral low back pain.  Ongoing issue, getting slightly worse. Headaches are much better. History of dysphagia, that has improved. Has a rash at the upper back, pruritic, she thinks is psoriasis.    Review of Systems See above   Past Medical History:  Diagnosis Date   Allergy    Anemia    Anxiety    Arthritis    Blood transfusion without reported diagnosis    Cataract    Constipation, chronic    DEGENERATIVE JOINT DISEASE 06/02/2010   Depression    GERD (gastroesophageal reflux disease)    Glaucoma 05/15/2012   Right eye   Headache(784.0)    Hyperlipidemia    hx of   Osteopenia    Osteoporosis    Psoriasis    PUD (peptic ulcer disease) 05/15/2010   Upper GI bleed d/t NSAIDs   RLS (restless legs syndrome)    robaxin prn   TIA (transient ischemic attack) 09/2019    Past Surgical History:  Procedure Laterality Date   ANKLE FRACTURE SURGERY Left    surgery 12-11 (LEFT)   CESAREAN SECTION  05/16/1967   COLONOSCOPY  12/17/2013   TOTAL KNEE ARTHROPLASTY Left 12/28/2021    Current Outpatient Medications  Medication Instructions   amoxicillin (AMOXIL) 500 MG capsule    amoxicillin-clavulanate (AUGMENTIN) 875-125 MG tablet 1 tablet, Oral, 2 times daily   aspirin EC 81 mg, Oral, Daily, Swallow whole.   buPROPion (WELLBUTRIN XL) 300 mg, Oral, Daily   cetirizine (ZYRTEC) 10 mg, Oral, At bedtime PRN,     cholecalciferol (VITAMIN D3) 10 MCG (400 UNIT) TABS tablet Oral   ezetimibe (ZETIA) 10 mg, Oral, Daily   gabapentin (NEURONTIN) 100 mg, Oral, Daily at bedtime   methocarbamol (ROBAXIN) 500 MG tablet TAKE ONE (1) TABLET BY MOUTH 3 TIMES DAILY AS NEEDED FOR MUSCLE SPASMS   Multiple Vitamins-Minerals (ICAPS AREDS 2 PO) Oral   nystatin (MYCOSTATIN) 500,000  Units, Oral, 4 times daily   omeprazole (PRILOSEC) 40 mg, Oral, 2 times daily   propranolol (INDERAL) 10 mg, Oral, 3 times daily   sertraline (ZOLOFT) 100 MG tablet TAKE TWO TABLETS BY MOUTH DAILY   traMADol (ULTRAM) 50 MG tablet    Travoprost, BAK Free, (TRAVATAN) 0.004 % SOLN ophthalmic solution 1 drop, Right Eye, Daily at bedtime   zolpidem (AMBIEN) 5 mg, Oral, At bedtime PRN       Objective:   Physical Exam BP 134/84   Pulse 75   Temp 98.3 F (36.8 C) (Oral)   Resp 16   Ht '5\' 4"'$  (1.626 m)   Wt 165 lb 8 oz (75.1 kg)   SpO2 94%   BMI 28.41 kg/m  General: Well developed, NAD, BMI noted Neck: No  thyromegaly  HEENT:  Normocephalic . Face symmetric, atraumatic Lungs:  CTA B Normal respiratory effort, no intercostal retractions, no accessory muscle use. Heart: RRR,  no murmur.  Abdomen:  Not distended, soft, non-tender. No rebound or rigidity.   Lower extremities: no pretibial edema bilaterally  Skin: At the upper left back has a rash, round, approximately 2 inches in diameter, borders are slightly elevated and scaly. Neurologic:  alert & oriented X3.  Speech normal, gait appropriate for age and unassisted Strength symmetric and appropriate for age.  Psych:  Cognition and judgment appear intact.  Cooperative with normal attention span and concentration.  Behavior appropriate. No anxious or depressed appearing.     Assessment     Assessment Hyperlipidemia: Lipitor causes myalgias, declined other statins Anxiety depression insomnia DJD Migraines, chronic: Saw neurology 12/01/2020: Failed verapamil, nortriptyline and topiramate. Rx Emgality.  Tramadol prn  TIA: 2021 Admitted to the hospital with left-sided numbness, symptoms of resolved, work-up included: 2D echo normal with grade 1 diastolic dysfunction.  No PFO or thrombus. MRA head no acute, 2 mm anterior communicating artery aneurysm. MRA neck normal LDL 162.  Sodium 133, slightly low.    Neurology notes:  TIA versus complicated migraine, recommended aspirin and Plavix for 3 weeks, then aspirin.  Osteoporosis: 07-2014 T score of -1.7, T score (November 2021) -2.8: sports medicine Rx Prolia PUD due to NSAIDs RLS - robaxin  prn Neuropathy: dx sports meds  Glaucoma Chronic constipation Skin psoriasis , Dx LSC (lichen )  ~ [id number] , sees derm   PLAN: Here for CPX Hyperlipidemia, statin intolerant, on Zetia, checking labs Anxiety, depression, insomnia: Symptoms are well-controlled, continue Wellbutrin, Zoloft, Ambien. MSK: Reports back pain, ongoing problem getting slightly worse.  Recommend to discuss with the sports medicine. Migraines, chronic: Currently not having major problems, off Emgality which she took temporarily. TIA, history of 2021: On aspirin Osteoporosis: reports is on  Evenity, recommend to reach out to Dr. Raeford Razor. Rash: Pruritic rash on the upper left back, on clinical grounds possibly a fungal infection, prescribed Nizoral Otitis media Recently diagnosed, finished antibiotics, feeling much better GERD, history of PUD: Improved, no having dysphagia at this time.  Next steps per GI. RTC 6 months    - Tdap 2014.  Booster recommended - RSV recommended - PNM 23 2011, 04/26/2020 -prevnar: 2016 - completed zostavax and shingrix - covid vax : UTD -Had a flu shot -CCS: Colonoscopy: 05/15/2002, cscope 8-15, 1 polyp.  C-scope 07/11/2021, next per GI. --No further  PAPs, had a pelvic last year.  See comments under vaginal discharge.  - last MMG 05-2021.  Negative, will enter a order  -Diet-exercise discussed -Labs:see orders  AST ALT FLP CBC - Healthcare POA: See AVS    11 Essential tremors: This is the first time the patient reports tremors, mostly at her hands, suspect this is essential tremor. Plan: Trial with propranolol, low-dose, titrate up if needed.  Patient to let me know how this is working.  She will also let me know about her blood pressures. Hyperlipidemia: On  Zetia. Anxiety depression: Currently doing well on Wellbutrin and sertraline. GERD, history of PUD: Seen by GI 03/23/2022, they recommended a EGD for dysphagia, EGD show a benign-appearing esophageal stenosis, dilated.  Biopsies taken, results pending Migraines Saw neurology 8-23, headache felt to be cervicogenic. Also has migraine with aura, improved w/ Emgality Transient left hemisensory loss: TIA versus migraine.  Advised to follow-up in 6 months Osteoporosis: Was on Evenity prescribed at sports medicine.  Currently untreated, recommend to reach out to sports medicine for further advise.  Constipation: New problem, acute, going on for few days.  Recommend MiraLAX, possibly glycerin suppository. Call if no better Vaccine advise: Recommend RSV RTC CPX 3 months

## 2022-07-19 NOTE — Patient Instructions (Addendum)
Vaccines I recommend:  Tdap (tetanus) RSV vaccine  Check the  blood pressure regularly BP GOAL is between 110/65 and  135/85. If it is consistently higher or lower, let me know    GO TO THE LAB : Get the blood work     [place], [place] back for   checkup in 6 months    "[place] of attorney" ,  "Living will" (Advance care planning documents)  If you already have a living will or healthcare power of attorney, is recommended you bring the copy to be scanned in your chart.   The document will be available to all the doctors you see in the system.  Advance care planning is a process that supports adults in  understanding and sharing their preferences regarding future medical care.  The patient's preferences are recorded in documents called Advance Directives and the can be modified at any time while the patient is in full mental capacity.   If you don't have one, please consider create one.      More information at: [email or web address]

## 2022-07-20 ENCOUNTER — Encounter: Payer: Self-pay | Admitting: Internal Medicine

## 2022-07-20 NOTE — Assessment & Plan Note (Signed)
-   Tdap 2014.  Booster recommended - RSV recommended - PNM 23 2011, 04/26/2020 -prevnar: 2016 - completed zostavax and shingrix - covid vax : UTD -Had a flu shot -CCS: Colonoscopy: 05/15/2002, cscope 8-15, 1 polyp.  C-scope 07/11/2021, next per GI. --No further  PAPs  - last MMG 05-2021.  order for f/u MMG sent  -Diet-exercise discussed -Labs:see orders  - Healthcare POA: See AVS

## 2022-07-20 NOTE — Assessment & Plan Note (Signed)
Here for CPX Hyperlipidemia, statin intolerant, on Zetia, checking labs Anxiety, depression, insomnia: Symptoms are well-controlled, continue Wellbutrin, Zoloft, Ambien. MSK: Reports back pain, ongoing problem getting slightly worse.  Recommend to discuss with the sports medicine. Migraines, chronic: Currently not having major problems, off Emgality which she took temporarily. TIA, history of 2021: On aspirin Osteoporosis: reports is on  Evenity, recommend to reach out to Dr. Raeford Razor. Rash: Pruritic rash on the upper left back, on clinical grounds possibly a fungal infection, prescribed Nizoral Otitis media Recently diagnosed, finished antibiotics, feeling much better GERD, history of PUD: Improved, no having dysphagia at this time.  Next steps per GI. RTC 6 months

## 2022-07-31 ENCOUNTER — Ambulatory Visit (INDEPENDENT_AMBULATORY_CARE_PROVIDER_SITE_OTHER): Payer: Medicare PPO | Admitting: *Deleted

## 2022-07-31 DIAGNOSIS — Z0000 Encounter for general adult medical examination without abnormal findings: Secondary | ICD-10-CM

## 2022-07-31 NOTE — Progress Notes (Signed)
Subjective:   Cynthia [name] is a 77 y.o. female who presents for Medicare Annual (Subsequent) preventive examination.  I connected with  Cynthia [name] on 07/31/22 by a audio enabled telemedicine application and verified that I am speaking with the correct person using two identifiers.  Patient Location: Home  Provider Location: Office/Clinic  I discussed the limitations of evaluation and management by telemedicine. The patient expressed understanding and agreed to proceed.   Review of Systems     Cardiac Risk Factors include: advanced age (>62men, >8 women);dyslipidemia     Objective:    There were no vitals filed for this visit. There is no height or weight on file to calculate BMI.     07/31/2022    9:19 AM 06/16/2022    1:16 PM 12/14/2021   10:13 AM 05/17/2021    1:41 PM 01/12/2020    9:55 AM 09/23/2019   10:11 AM 09/14/2019    2:23 PM  Advanced Directives  Does Patient Have a Medical Advance Directive? Yes Yes Yes Yes Yes No Yes  Type of Paramedic of [place];Living will  [place];Living will [place];Living will [place];Living will    Does patient want to make changes to medical advance directive? No - Patient declined        Copy of [place] in Chart? No - copy requested   No - copy requested       Current Medications (verified) Outpatient Encounter Medications as of 07/31/2022  Medication Sig   amoxicillin (AMOXIL) 500 MG capsule  (Patient not taking: Reported on 07/19/2022)   aspirin EC 81 MG tablet Take 81 mg by mouth daily. Swallow whole.   buPROPion (WELLBUTRIN XL) 300 MG 24 hr tablet Take 1 tablet (300 mg total) by mouth daily.   cetirizine (ZYRTEC) 10 MG tablet Take 10 mg by mouth at bedtime as needed for allergies.   cholecalciferol (VITAMIN D3) 10 MCG (400 UNIT) TABS tablet Take by mouth.   ezetimibe (ZETIA) 10 MG tablet Take 1 tablet (10 mg total) by mouth  daily.   gabapentin (NEURONTIN) 100 MG capsule Take 1 capsule (100 mg total) by mouth at bedtime.   ketoconazole (NIZORAL) 2 % cream Apply 1 Application topically daily.   methocarbamol (ROBAXIN) 500 MG tablet TAKE ONE (1) TABLET BY MOUTH 3 TIMES DAILY AS NEEDED FOR MUSCLE SPASMS   Multiple Vitamins-Minerals (ICAPS AREDS 2 PO) Take by mouth.   nystatin (MYCOSTATIN) 100000 UNIT/ML suspension Take 5 mLs (500,000 Units total) by mouth 4 (four) times daily.   omeprazole (PRILOSEC) 40 MG capsule Take 1 capsule (40 mg total) by mouth 2 (two) times daily.   propranolol (INDERAL) 10 MG tablet Take 1 tablet (10 mg total) by mouth 3 (three) times daily.   sertraline (ZOLOFT) 100 MG tablet TAKE TWO TABLETS BY MOUTH DAILY   traMADol (ULTRAM) 50 MG tablet    Travoprost, BAK Free, (TRAVATAN) 0.004 % SOLN ophthalmic solution Place 1 drop into the right eye at bedtime.   zolpidem (AMBIEN) 5 MG tablet Take 1 tablet (5 mg total) by mouth at bedtime as needed for sleep.   [DISCONTINUED] RSV vaccine recomb adjuvanted (AREXVY) 120 MCG/0.5ML injection Inject into the muscle.   No facility-administered encounter medications on file as of 07/31/2022.    Allergies (verified) Bee pollen, Motrin [ibuprofen], Pollen extract, Statins, Sulfonamide derivatives, and Ciprofloxacin   History: Past Medical History:  Diagnosis Date   Allergy  Anemia    Anxiety    Arthritis    Blood transfusion without reported diagnosis    Cataract    Constipation, chronic    DEGENERATIVE JOINT DISEASE 06/02/2010   Depression    GERD (gastroesophageal reflux disease)    Glaucoma 05/15/2012   Right eye   Headache(784.0)    Hyperlipidemia    hx of   Osteopenia    Osteoporosis    Psoriasis    PUD (peptic ulcer disease) 05/15/2010   Upper GI bleed d/t NSAIDs   RLS (restless legs syndrome)    robaxin prn   TIA (transient ischemic attack) 09/2019   Past Surgical History:  Procedure Laterality Date   ANKLE FRACTURE SURGERY  Left    surgery 12-11 (LEFT)   CESAREAN SECTION  05/16/1967   COLONOSCOPY  12/17/2013   EYE SURGERY  2020   cataract removal   FRACTURE SURGERY  2012   fracture in foot   JOINT REPLACEMENT  August, 2023   Left knee   TOTAL KNEE ARTHROPLASTY Left 12/28/2021   Family History  Problem Relation Age of Onset   Aneurysm Mother        brain   Celiac disease Brother    Diabetes Maternal Grandfather    Hypertension Neg Hx    Breast cancer Neg Hx    Coronary artery disease Neg Hx    Colon cancer Neg Hx    Esophageal cancer Neg Hx    Stomach cancer Neg Hx    Rectal cancer Neg Hx    Colon polyps Neg Hx    Social History   Socioeconomic History   Marital status: Widowed    Spouse name: Not on file   Number of children: 1   Years of education: Not on file   Highest education level: Doctorate  Occupational History   Occupation: retired VP of Tax inspector: RETIRED  Tobacco Use   Smoking status: Never    Passive exposure: Never   Smokeless tobacco: Never  Vaping Use   Vaping Use: Never used  Substance and Sexual Activity   Alcohol use: Yes    Comment: socially   Drug use: No   Sexual activity: Not Currently  Other Topics Concern   Not on file  Social History Narrative   2 step children, 1 child , 7 GK   Lost husband, moved [place] 02-13-2020 ; apartment, 3th floor w/ elevator      Pt is right handed   Social Determinants of Health   Financial Resource Strain: [place]  (05/17/2021)   Overall Financial Resource Strain (CARDIA)    Difficulty of Paying Living Expenses: Not hard at all  Food Insecurity: No Food Insecurity (07/31/2022)   Hunger Vital Sign    Worried About Running Out of Food in the Last Year: Never true    [place] in the Last Year: Never true  Transportation Needs: No Transportation Needs (07/31/2022)   PRAPARE - Hydrologist (Medical): No    Lack of Transportation (Non-Medical): No  Physical Activity:  Sufficiently Active (05/17/2021)   Exercise Vital Sign    Days of Exercise per Week: 4 days    Minutes of Exercise per Session: 40 min  Stress: No Stress Concern Present (05/17/2021)   [place]    Feeling of Stress : Only a little  Social Connections: Moderately Integrated (05/17/2021)   Social Connection and  Isolation Panel [NHANES]    Frequency of Communication with Friends and Family: More than three times a week    Frequency of Social Gatherings with Friends and Family: More than three times a week    Attends Religious Services: More than 4 times per year    Active Member of [employer] or Organizations: Yes    Attends Archivist Meetings: More than 4 times per year    Marital Status: Widowed    Tobacco Counseling Counseling given: Not Answered   Clinical Intake:  Pre-visit preparation completed: Yes  Pain : No/denies pain  Diabetes: No  How often do you need to have someone help you when you read instructions, pamphlets, or other written materials from your doctor or pharmacy?: 1 - Never  Activities of Daily Living    07/31/2022    9:12 AM  In your present state of health, do you have any difficulty performing the following activities:  Hearing? 1  Comment hearing loss  Vision? 1  Difficulty concentrating or making decisions? 0  Walking or climbing stairs? 1  Comment going upstairs is more difficult, had left knee replacement in Aug 2023  Dressing or bathing? 0  Doing errands, shopping? 0  Preparing Food and eating ? N  Using the Toilet? N  In the past six months, have you accidently leaked urine? Y  Do you have problems with loss of bowel control? N  Managing your Medications? N  Managing your Finances? N  Housekeeping or managing your Housekeeping? N    Patient Care Team: Colon Branch, MD as PCP - General Iona Beard, OD as Referring Physician (Optometry) Pieter Partridge, DO as Consulting  Physician (Neurology) Deirdre Pippins, PA-C as Physician Assistant (Dermatology) Page Spiro, [place] as Consulting Physician (Chiropractic Medicine)  Indicate any recent Medical Services you may have received from other than [facility] providers in the past year (date may be approximate).     Assessment:   This is a routine wellness examination for [place].  Hearing/Vision screen No results found.  Dietary issues and exercise activities discussed: Current Exercise Habits: Home exercise routine, Type of exercise: walking;Other - see comments (physical therapy, stationary bike), Time (Minutes): 50, Frequency (Times/Week): 3, Weekly Exercise (Minutes/Week): 150, Intensity: Mild, Exercise limited by: orthopedic condition(s)   Goals Addressed   None    Depression Screen    07/31/2022    9:11 AM 07/19/2022    9:20 AM 04/10/2022    2:42 PM 12/01/2021    8:57 AM 09/27/2021    9:55 AM 05/26/2021    8:27 AM 05/17/2021    1:46 PM  PHQ 2/9 Scores  PHQ - 2 Score 1 2 2 2 2 1 1   PHQ- 9 Score  9 5 7 8 8      Fall Risk    07/31/2022    9:08 AM 07/19/2022    8:44 AM 06/16/2022    1:16 PM 04/10/2022    2:08 PM 12/14/2021   10:12 AM  Fall Risk   Falls in the past year? 1 1 1 1 1   Number falls in past yr: 1 1 1  0 1  Injury with Fall? 0 0 0 0 0  Risk for fall due to : History of fall(s)      Follow up Falls evaluation completed Falls evaluation completed;Education provided Falls evaluation completed Falls evaluation completed     [place]:  Any stairs in or around the home? Yes  If so,  are there any without handrails? No  Home free of loose throw rugs in walkways, pet beds, electrical cords, etc? Yes  Adequate lighting in your home to reduce risk of falls? Yes   ASSISTIVE DEVICES UTILIZED TO PREVENT FALLS:  Life alert? No  Use of a cane, walker or w/c? No  Grab bars in the bathroom? Yes  Shower chair or bench in shower? Yes  Elevated toilet seat or a  handicapped toilet? Yes   TIMED UP AND GO:  Was the test performed?  No, audio visit .    Cognitive Function:    07/04/2016   10:11 AM  MMSE - Mini Mental State Exam  Orientation to time 5  Orientation to Place 5  Registration 3  Attention/ Calculation 5  Recall 2  Language- name 2 objects 2  Language- repeat 1  Language- follow 3 step command 3  Language- read & follow direction 1  Write a sentence 1  Copy design 1  Total score 29        07/31/2022    9:20 AM  6CIT Screen  What Year? 0 points  What month? 0 points  What time? 0 points  Count back from 20 0 points  Months in reverse 0 points  Repeat phrase 0 points  Total Score 0 points    Immunizations Immunization History  Administered Date(s) Administered   Covid-19, Mrna,Vaccine(Spikevax)55yrs and older 02/22/2022   Fluad Quad(high Dose 65+) 01/30/2019, 02/17/2021   Influenza Split 03/13/2011, 01/31/2012   Influenza Whole 03/05/2007, 02/26/2008, 01/28/2010   Influenza, High Dose Seasonal PF 03/06/2013, 02/15/2015, 02/14/2016, 03/19/2018, 02/10/2020, 02/21/2021, 02/13/2022   Influenza-Unspecified 02/14/2017   PFIZER(Purple Top)SARS-COV-2 Vaccination 06/06/2019, 06/27/2019, 03/09/2020, 09/01/2020   Pfizer Covid-19 Vaccine Bivalent Booster 68yrs & up 01/26/2021, 02/22/2022   Pneumococcal Conjugate-13 07/03/2014   Pneumococcal Polysaccharide-23 11/09/2009, 04/26/2020   Respiratory Syncytial Virus Vaccine,Recomb Aduvanted(Arexvy) 07/19/2022   Td 05/16/1999, 11/09/2009   Tdap 02/27/2013   Unspecified SARS-COV-2 Vaccination 10/05/2021   Zoster Recombinat (Shingrix) 01/29/2018, 04/16/2018   Zoster, Live 12/06/2007    TDAP status: Up to date  Flu Vaccine status: Up to date  Pneumococcal vaccine status: Up to date  Covid-19 vaccine status: Information provided on how to obtain vaccines.   Qualifies for Shingles Vaccine? Yes   Zostavax completed Yes   Shingrix Completed?: Yes  Screening Tests Health  Maintenance  Topic Date Due   Medicare Annual Wellness (AWV)  05/17/2022   COVID-19 Vaccine (8 - 2023-24 season) 01/18/2023 (Originally 04/19/2022)   DTaP/Tdap/Td (4 - Td or Tdap) 02/28/2023   COLONOSCOPY (Pts 45-8yrs Insurance coverage will need to be confirmed)  07/11/2028   Pneumonia Vaccine 41+ Years old  Completed   INFLUENZA VACCINE  Completed   DEXA SCAN  Completed   Hepatitis C Screening  Completed   Zoster Vaccines- Shingrix  Completed   HPV VACCINES  Aged Out    Health Maintenance  Health Maintenance Due  Topic Date Due   Medicare Annual Wellness (AWV)  05/17/2022    Colorectal cancer screening: Type of screening: Colonoscopy. Completed 07/11/21. Repeat every 7 years  Mammogram status: Completed 05/26/21. Repeat every year. Pt scheduled for 08/07/22  Bone Density status: Completed 03/26/20. Results reflect: Bone density results: OSTEOPOROSIS. Repeat every 2 years.  Lung Cancer Screening: (Low Dose CT Chest recommended if Age 56-80 years, 30 pack-year currently smoking OR have quit w/in 15years.) does not qualify.   Additional Screening:  Hepatitis C Screening: does qualify; Completed 11/22/16  Vision Screening: Recommended annual  ophthalmology exams for early detection of glaucoma and other disorders of the eye. Is the patient up to date with their annual eye exam?  Yes  Who is the provider or what is the name of the office in which the patient attends annual eye exams? [place] If pt is not established with a provider, would they like to be referred to a provider to establish care? No .   Dental Screening: Recommended annual dental exams for proper oral hygiene  Community Resource Referral / Chronic Care Management: CRR required this visit?  No   CCM required this visit?  No      Plan:     I have personally reviewed and noted the following in the patient's chart:   Medical and social history Use of alcohol, tobacco or illicit drugs  Current  medications and supplements including opioid prescriptions. Patient is currently taking opioid prescriptions. Information provided to patient regarding non-opioid alternatives. Patient advised to discuss non-opioid treatment plan with their provider. Functional ability and status Nutritional status Physical activity Advanced directives List of other physicians Hospitalizations, surgeries, and ER visits in previous 12 months Vitals Screenings to include cognitive, depression, and falls Referrals and appointments  In addition, I have reviewed and discussed with patient certain preventive protocols, quality metrics, and best practice recommendations. A written personalized care plan for preventive services as well as general preventive health recommendations were provided to patient.   Due to this being a telephonic visit, the after visit summary with patients personalized plan was offered to patient via mail or my-chart. Patient would like to access on my-chart.  Beatris Ship, [place]   07/31/2022   Nurse Notes: None

## 2022-07-31 NOTE — Patient Instructions (Signed)
Cynthia [name] , Thank you for taking time to come for your Medicare Wellness Visit. I appreciate your ongoing commitment to your health goals. Please review the following plan we discussed and let me know if I can assist you in the future.     This is a list of the screening recommended for you and due dates:  Health Maintenance  Topic Date Due   COVID-19 Vaccine (8 - 2023-24 season) 01/18/2023*   DTaP/Tdap/Td vaccine (4 - Td or Tdap) 02/28/2023   Medicare Annual Wellness Visit  07/31/2023   Colon Cancer Screening  07/11/2028   Pneumonia Vaccine  Completed   Flu Shot  Completed   DEXA scan (bone density measurement)  Completed   Hepatitis C Screening: USPSTF Recommendation to screen - Ages 23-79 yo.  Completed   Zoster (Shingles) Vaccine  Completed   HPV Vaccine  Aged Out  *Topic was postponed. The date shown is not the original due date.    Next appointment: Follow up in one year for your annual wellness visit.   Preventive Care 50 Years and Older, Female Preventive care refers to lifestyle choices and visits with your health care provider that can promote health and wellness. What does preventive care include? A yearly physical exam. This is also called an annual well check. Dental exams once or twice a year. Routine eye exams. Ask your health care provider how often you should have your eyes checked. Personal lifestyle choices, including: Daily care of your teeth and gums. Regular physical activity. Eating a healthy diet. Avoiding tobacco and drug use. Limiting alcohol use. Practicing safe sex. Taking low-dose aspirin every day. Taking vitamin and mineral supplements as recommended by your health care provider. What happens during an annual well check? The services and screenings done by your health care provider during your annual well check will depend on your age, overall health, lifestyle risk factors, and family history of disease. Counseling  Your health care provider  may ask you questions about your: Alcohol use. Tobacco use. Drug use. Emotional well-being. Home and relationship well-being. Sexual activity. Eating habits. History of falls. Memory and ability to understand (cognition). Work and work Statistician. Reproductive health. Screening  You may have the following tests or measurements: Height, weight, and BMI. Blood pressure. Lipid and cholesterol levels. These may be checked every 5 years, or more frequently if you are over 23 years old. Skin check. Lung cancer screening. You may have this screening every year starting at age 33 if you have a 30-pack-year history of smoking and currently smoke or have quit within the past 15 years. Fecal occult blood test (FOBT) of the stool. You may have this test every year starting at age 3. Flexible sigmoidoscopy or colonoscopy. You may have a sigmoidoscopy every 5 years or a colonoscopy every 10 years starting at age 48. Hepatitis C blood test. Hepatitis B blood test. Sexually transmitted disease (STD) testing. Diabetes screening. This is done by checking your blood sugar (glucose) after you have not eaten for a while (fasting). You may have this done every 1-3 years. Bone density scan. This is done to screen for osteoporosis. You may have this done starting at age 94. Mammogram. This may be done every 1-2 years. Talk to your health care provider about how often you should have regular mammograms. Talk with your health care provider about your test results, treatment options, and if necessary, the need for more tests. Vaccines  Your health care provider may recommend certain vaccines, such  as: Influenza vaccine. This is recommended every year. Tetanus, diphtheria, and acellular pertussis (Tdap, Td) vaccine. You may need a Td booster every 10 years. Zoster vaccine. You may need this after age 35. Pneumococcal 13-valent conjugate (PCV13) vaccine. One dose is recommended after age 43. Pneumococcal  polysaccharide (PPSV23) vaccine. One dose is recommended after age 39. Talk to your health care provider about which screenings and vaccines you need and how often you need them. This information is not intended to replace advice given to you by your health care provider. Make sure you discuss any questions you have with your health care provider. Document Released: 05/28/2015 Document Revised: 01/19/2016 Document Reviewed: 03/02/2015 Elsevier Interactive Patient Education  2017 [place] Prevention in the Home Falls can cause injuries. They can happen to people of all ages. There are many things you can do to make your home safe and to help prevent falls. What can I do on the outside of my home? Regularly fix the edges of walkways and driveways and fix any cracks. Remove anything that might make you trip as you walk through a door, such as a raised step or threshold. Trim any bushes or trees on the path to your home. Use bright outdoor lighting. Clear any walking paths of anything that might make someone trip, such as rocks or tools. Regularly check to see if handrails are loose or broken. Make sure that both sides of any steps have handrails. Any raised decks and porches should have guardrails on the edges. Have any leaves, snow, or ice cleared regularly. Use sand or salt on walking paths during winter. Clean up any spills in your garage right away. This includes oil or grease spills. What can I do in the bathroom? Use night lights. Install grab bars by the toilet and in the tub and shower. Do not use towel bars as grab bars. Use non-skid mats or decals in the tub or shower. If you need to sit down in the shower, use a plastic, non-slip stool. Keep the floor dry. Clean up any water that spills on the floor as soon as it happens. Remove soap buildup in the tub or shower regularly. Attach bath mats securely with double-sided non-slip rug tape. Do not have throw rugs and other  things on the floor that can make you trip. What can I do in the bedroom? Use night lights. Make sure that you have a light by your bed that is easy to reach. Do not use any sheets or blankets that are too big for your bed. They should not hang down onto the floor. Have a firm chair that has side arms. You can use this for support while you get dressed. Do not have throw rugs and other things on the floor that can make you trip. What can I do in the kitchen? Clean up any spills right away. Avoid walking on wet floors. Keep items that you use a lot in easy-to-reach places. If you need to reach something above you, use a strong step stool that has a grab bar. Keep electrical cords out of the way. Do not use floor polish or wax that makes floors slippery. If you must use wax, use non-skid floor wax. Do not have throw rugs and other things on the floor that can make you trip. What can I do with my stairs? Do not leave any items on the stairs. Make sure that there are handrails on both sides of the stairs and use  them. Fix handrails that are broken or loose. Make sure that handrails are as long as the stairways. Check any carpeting to make sure that it is firmly attached to the stairs. Fix any carpet that is loose or worn. Avoid having throw rugs at the top or bottom of the stairs. If you do have throw rugs, attach them to the floor with carpet tape. Make sure that you have a light switch at the top of the stairs and the bottom of the stairs. If you do not have them, ask someone to add them for you. What else can I do to help prevent falls? Wear shoes that: Do not have high heels. Have rubber bottoms. Are comfortable and fit you well. Are closed at the toe. Do not wear sandals. If you use a stepladder: Make sure that it is fully opened. Do not climb a closed stepladder. Make sure that both sides of the stepladder are locked into place. Ask someone to hold it for you, if possible. Clearly  mark and make sure that you can see: Any grab bars or handrails. First and last steps. Where the edge of each step is. Use tools that help you move around (mobility aids) if they are needed. These include: Canes. Walkers. Scooters. Crutches. Turn on the lights when you go into a dark area. Replace any light bulbs as soon as they burn out. Set up your furniture so you have a clear path. Avoid moving your furniture around. If any of your floors are uneven, fix them. If there are any pets around you, be aware of where they are. Review your medicines with your doctor. Some medicines can make you feel dizzy. This can increase your chance of falling. Ask your doctor what other things that you can do to help prevent falls. This information is not intended to replace advice given to you by your health care provider. Make sure you discuss any questions you have with your health care provider. Document Released: 02/25/2009 Document Revised: 10/07/2015 Document Reviewed: 06/05/2014 Elsevier Interactive Patient Education  2017 [employer].

## 2022-08-01 ENCOUNTER — Other Ambulatory Visit: Payer: Self-pay | Admitting: Internal Medicine

## 2022-08-01 DIAGNOSIS — K2980 Duodenitis without bleeding: Secondary | ICD-10-CM

## 2022-08-01 DIAGNOSIS — K449 Diaphragmatic hernia without obstruction or gangrene: Secondary | ICD-10-CM

## 2022-08-01 DIAGNOSIS — R1310 Dysphagia, unspecified: Secondary | ICD-10-CM

## 2022-08-01 DIAGNOSIS — K209 Esophagitis, unspecified without bleeding: Secondary | ICD-10-CM

## 2022-08-01 DIAGNOSIS — K259 Gastric ulcer, unspecified as acute or chronic, without hemorrhage or perforation: Secondary | ICD-10-CM

## 2022-08-01 DIAGNOSIS — K219 Gastro-esophageal reflux disease without esophagitis: Secondary | ICD-10-CM

## 2022-08-07 ENCOUNTER — Encounter (HOSPITAL_BASED_OUTPATIENT_CLINIC_OR_DEPARTMENT_OTHER): Payer: Self-pay

## 2022-08-07 ENCOUNTER — Ambulatory Visit (HOSPITAL_BASED_OUTPATIENT_CLINIC_OR_DEPARTMENT_OTHER)
Admission: RE | Admit: 2022-08-07 | Discharge: 2022-08-07 | Disposition: A | Discharge status: Home / Self Care | Payer: Medicare PPO | Source: Ambulatory Visit | Attending: Internal Medicine | Admitting: Internal Medicine

## 2022-08-07 DIAGNOSIS — Z1231 Encounter for screening mammogram for malignant neoplasm of breast: Secondary | ICD-10-CM | POA: Diagnosis not present

## 2022-08-08 ENCOUNTER — Encounter: Payer: Self-pay | Admitting: Internal Medicine

## 2022-08-08 ENCOUNTER — Encounter: Payer: Medicare PPO | Admitting: Internal Medicine

## 2022-08-08 ENCOUNTER — Ambulatory Visit (AMBULATORY_SURGERY_CENTER): Payer: Medicare PPO | Admitting: Internal Medicine

## 2022-08-08 VITALS — BP 110/68 | HR 71 | Temp 97.3°F | Resp 11 | Ht 64.0 in | Wt 166.0 lb

## 2022-08-08 DIAGNOSIS — K2980 Duodenitis without bleeding: Secondary | ICD-10-CM | POA: Diagnosis not present

## 2022-08-08 DIAGNOSIS — K259 Gastric ulcer, unspecified as acute or chronic, without hemorrhage or perforation: Secondary | ICD-10-CM | POA: Diagnosis not present

## 2022-08-08 DIAGNOSIS — K449 Diaphragmatic hernia without obstruction or gangrene: Secondary | ICD-10-CM | POA: Diagnosis not present

## 2022-08-08 DIAGNOSIS — K200 Eosinophilic esophagitis: Secondary | ICD-10-CM

## 2022-08-08 DIAGNOSIS — R1310 Dysphagia, unspecified: Secondary | ICD-10-CM

## 2022-08-08 DIAGNOSIS — F419 Anxiety disorder, unspecified: Secondary | ICD-10-CM | POA: Diagnosis not present

## 2022-08-08 DIAGNOSIS — K219 Gastro-esophageal reflux disease without esophagitis: Secondary | ICD-10-CM

## 2022-08-08 DIAGNOSIS — K209 Esophagitis, unspecified without bleeding: Secondary | ICD-10-CM | POA: Diagnosis not present

## 2022-08-08 DIAGNOSIS — F32A Depression, unspecified: Secondary | ICD-10-CM | POA: Diagnosis not present

## 2022-08-08 MED ORDER — SODIUM CHLORIDE 0.9 % IV SOLN: 500.0000 mL | Freq: Once | INTRAVENOUS | Status: DC

## 2022-08-08 NOTE — Progress Notes (Signed)
Called to room to assist during endoscopic procedure.  Patient ID and intended procedure confirmed with present staff. Received instructions for my participation in the procedure from the performing physician.  

## 2022-08-08 NOTE — Progress Notes (Signed)
Vss nad trans to pacu 

## 2022-08-08 NOTE — Patient Instructions (Signed)
  THank you for letting us take care of your healthcare needs today. Please see handout given to you on Hiatal Hernia. Await pathology results. You may resume your normal diet.   YOU HAD AN ENDOSCOPIC PROCEDURE TODAY AT [facility]:   Refer to the procedure report that was given to you for any specific questions about what was found during the examination.  If the procedure report does not answer your questions, please call your gastroenterologist to clarify.  If you requested that your care partner not be given the details of your procedure findings, then the procedure report has been included in a sealed envelope for you to review at your convenience later.  YOU SHOULD EXPECT: Some feelings of bloating in the abdomen. Passage of more gas than usual.  Walking can help get rid of the air that was put into your GI tract during the procedure and reduce the bloating. If you had a lower endoscopy (such as a colonoscopy or flexible sigmoidoscopy) you may notice spotting of blood in your stool or on the toilet paper. If you underwent a bowel prep for your procedure, you may not have a normal bowel movement for a few days.  Please Note:  You might notice some irritation and congestion in your nose or some drainage.  This is from the oxygen used during your procedure.  There is no need for concern and it should clear up in a day or so.  SYMPTOMS TO REPORT IMMEDIATELY:   Following upper endoscopy (EGD)  Vomiting of blood or coffee ground material  New chest pain or pain under the shoulder blades  Painful or persistently difficult swallowing  New shortness of breath  Fever of 100F or higher  Black, tarry-looking stools  For urgent or emergent issues, a gastroenterologist can be reached at any hour by calling [phone]. Do not use MyChart messaging for urgent concerns.    DIET:  We do recommend a small meal at first, but then you may proceed to your regular diet.  Drink plenty  of fluids but you should avoid alcoholic beverages for 24 hours.  ACTIVITY:  You should plan to take it easy for the rest of today and you should NOT DRIVE or use heavy machinery until tomorrow (because of the sedation medicines used during the test).    FOLLOW UP: Our staff will call the number listed on your records the next business day following your procedure.  We will call around 7:15- 8:00 am to check on you and address any questions or concerns that you may have regarding the information given to you following your procedure. If we do not reach you, we will leave a message.     If any biopsies were taken you will be contacted by phone or by letter within the next 1-3 weeks.  Please call us at [phone] if you have not heard about the biopsies in 3 weeks.    SIGNATURES/CONFIDENTIALITY: You and/or your care partner have signed paperwork which will be entered into your electronic medical record.  These signatures attest to the fact that that the information above on your After Visit Summary has been reviewed and is understood.  Full responsibility of the confidentiality of this discharge information lies with you and/or your care-partner.

## 2022-08-08 NOTE — Progress Notes (Signed)
VS completed by DT.  Pt's states no medical or surgical changes since previsit or office visit.  

## 2022-08-08 NOTE — Op Note (Signed)
[facility] Patient Name: Cynthia [name] Procedure Date: 08/08/2022 10:35 AM MRN: [id number] Endoscopist: Adline Mango [place] , , [id number] Age: 77 Referring MD:  Date of Birth: Nov 17, 1945 Gender: Female Account #: [id number] Procedure:                Upper GI endoscopy Indications:              Follow-up of eosinophilic esophagitis Medicines:                Monitored Anesthesia Care Procedure:                Pre-Anesthesia Assessment:                           - Prior to the procedure, a History and Physical                            was performed, and patient medications and                            allergies were reviewed. The patient's tolerance of                            previous anesthesia was also reviewed. The risks                            and benefits of the procedure and the sedation                            options and risks were discussed with the patient.                            All questions were answered, and informed consent                            was obtained. Prior Anticoagulants: The patient has                            taken no anticoagulant or antiplatelet agents. ASA                            Grade Assessment: II - A patient with mild systemic                            disease. After reviewing the risks and benefits,                            the patient was deemed in satisfactory condition to                            undergo the procedure.                           After obtaining informed consent, the endoscope was  passed under direct vision. Throughout the                            procedure, the patient's blood pressure, pulse, and                            oxygen saturations were monitored continuously. The                            GIF [id number] was introduced through the                            mouth, and advanced to the second part of duodenum.                            The upper GI  endoscopy was accomplished without                            difficulty. The patient tolerated the procedure                            well. Scope In: Scope Out: Findings:                 Mucosal changes including ringed esophagus were                            found in the entire esophagus. Biopsies were                            obtained from the proximal and distal esophagus                            with cold forceps for histology of suspected                            eosinophilic esophagitis.                           A small hiatal hernia was present.                           The examined duodenum was normal. Complications:            No immediate complications. Estimated Blood Loss:     Estimated blood loss was minimal. Impression:               - Esophageal mucosal changes.                           - Small hiatal hernia.                           - Normal examined duodenum.                           - Biopsies were taken with a cold forceps  for                            evaluation of eosinophilic esophagitis. Recommendation:           - Discharge patient to home (with escort).                           - Await pathology results.                           - Return to GI clinic in 3 months.                           - The findings and recommendations were discussed                            with the patient. Dr Georgian Co "[place]" Fellows,  08/08/2022 11:01:22 AM

## 2022-08-08 NOTE — Progress Notes (Signed)
GASTROENTEROLOGY PROCEDURE H&P NOTE   Primary Care Physician: Colon Branch, MD    Reason for Procedure:   Eosinophilic esophagitis  Plan:    EGD  Patient is appropriate for endoscopic procedure(s) in the ambulatory ([facility]) setting.  The nature of the procedure, as well as the risks, benefits, and alternatives were carefully and thoroughly reviewed with the patient. Ample time for discussion and questions allowed. The patient understood, was satisfied, and agreed to proceed.     HPI: Cynthia [name] is a 77 y.o. female who presents for EGD for evaluation of eosinophilic esophagitis .  Patient was most recently seen in the Gastroenterology Clinic on 05/19/22.  No interval change in medical history since that appointment. Please refer to that note for full details regarding GI history and clinical presentation.   Past Medical History:  Diagnosis Date   Allergy    Anemia    Anxiety    Arthritis    Blood transfusion without reported diagnosis    Cataract    Constipation, chronic    DEGENERATIVE JOINT DISEASE 06/02/2010   Depression    GERD (gastroesophageal reflux disease)    Glaucoma 05/15/2012   Right eye   Headache(784.0)    Hyperlipidemia    hx of   Osteopenia    Osteoporosis    Psoriasis    PUD (peptic ulcer disease) 05/15/2010   Upper GI bleed d/t NSAIDs   RLS (restless legs syndrome)    robaxin prn   TIA (transient ischemic attack) 09/2019    Past Surgical History:  Procedure Laterality Date   ANKLE FRACTURE SURGERY Left    surgery 12-11 (LEFT)   CESAREAN SECTION  05/16/1967   COLONOSCOPY  12/17/2013   EYE SURGERY  2020   cataract removal   FRACTURE SURGERY  2012   fracture in foot   JOINT REPLACEMENT  August, 2023   Left knee   TOTAL KNEE ARTHROPLASTY Left 12/28/2021   UPPER GASTROINTESTINAL ENDOSCOPY      Prior to Admission medications   Medication Sig Start Date End Date Taking? Authorizing Provider  aspirin EC 81 MG tablet Take 81 mg by mouth  daily. Swallow whole.   Yes [provider]  buPROPion (WELLBUTRIN XL) 300 MG 24 hr tablet Take 1 tablet (300 mg total) by mouth daily. 01/24/22  Yes Paz, Alda Berthold, MD  cetirizine (ZYRTEC) 10 MG tablet Take 10 mg by mouth at bedtime as needed for allergies.   Yes [provider]  cholecalciferol (VITAMIN D3) 10 MCG (400 UNIT) TABS tablet Take by mouth. 01/27/13  Yes [provider]  ezetimibe (ZETIA) 10 MG tablet Take 1 tablet (10 mg total) by mouth daily. 01/24/22  Yes Paz, Alda Berthold, MD  gabapentin (NEURONTIN) 100 MG capsule Take 1 capsule (100 mg total) by mouth at bedtime. 06/16/22  Yes Jaffe, Adam R, DO  ketoconazole (NIZORAL) 2 % cream Apply 1 Application topically daily. 07/19/22  Yes Paz, Alda Berthold, MD  Multiple Vitamins-Minerals (ICAPS AREDS 2 PO) Take by mouth.   Yes [provider]  omeprazole (PRILOSEC) 40 MG capsule TAKE ONE (1) CAPSULE BY MOUTH 2 TIMES DAILY 08/01/22  Yes Sharyn Creamer, MD  propranolol (INDERAL) 10 MG tablet Take 1 tablet (10 mg total) by mouth 3 (three) times daily. 08/01/22  Yes Paz, Alda Berthold, MD  sertraline (ZOLOFT) 100 MG tablet TAKE TWO TABLETS BY MOUTH DAILY 06/21/22  Yes Paz, Alda Berthold, MD  Travoprost, BAK Free, (TRAVATAN) 0.004 % SOLN ophthalmic  solution Place 1 drop into the right eye at bedtime.   Yes [provider]  zolpidem (AMBIEN) 5 MG tablet Take 1 tablet (5 mg total) by mouth at bedtime as needed for sleep. 09/22/21  Yes Colon Branch, MD  amoxicillin (AMOXIL) 500 MG capsule  02/07/22   [provider]  methocarbamol (ROBAXIN) 500 MG tablet TAKE ONE (1) TABLET BY MOUTH 3 TIMES DAILY AS NEEDED FOR MUSCLE SPASMS 06/10/21   Colon Branch, MD  nystatin (MYCOSTATIN) 100000 UNIT/ML suspension Take 5 mLs (500,000 Units total) by mouth 4 (four) times daily. 07/13/22   Marrian Salvage, FNP  traMADol Veatrice Bourbon) 50 MG tablet  11/22/20   [provider]    Current Outpatient Medications  Medication Sig Dispense Refill    aspirin EC 81 MG tablet Take 81 mg by mouth daily. Swallow whole.     buPROPion (WELLBUTRIN XL) 300 MG 24 hr tablet Take 1 tablet (300 mg total) by mouth daily. 90 tablet 1   cetirizine (ZYRTEC) 10 MG tablet Take 10 mg by mouth at bedtime as needed for allergies.     cholecalciferol (VITAMIN D3) 10 MCG (400 UNIT) TABS tablet Take by mouth.     ezetimibe (ZETIA) 10 MG tablet Take 1 tablet (10 mg total) by mouth daily. 90 tablet 1   gabapentin (NEURONTIN) 100 MG capsule Take 1 capsule (100 mg total) by mouth at bedtime. 30 capsule 5   ketoconazole (NIZORAL) 2 % cream Apply 1 Application topically daily. 30 g 0   Multiple Vitamins-Minerals (ICAPS AREDS 2 PO) Take by mouth.     omeprazole (PRILOSEC) 40 MG capsule TAKE ONE (1) CAPSULE BY MOUTH 2 TIMES DAILY 60 capsule 2   propranolol (INDERAL) 10 MG tablet Take 1 tablet (10 mg total) by mouth 3 (three) times daily. 270 tablet 2   sertraline (ZOLOFT) 100 MG tablet TAKE TWO TABLETS BY MOUTH DAILY 180 tablet 1   Travoprost, BAK Free, (TRAVATAN) 0.004 % SOLN ophthalmic solution Place 1 drop into the right eye at bedtime.     zolpidem (AMBIEN) 5 MG tablet Take 1 tablet (5 mg total) by mouth at bedtime as needed for sleep. 30 tablet 1   amoxicillin (AMOXIL) 500 MG capsule  (Patient not taking: Reported on 07/19/2022)     methocarbamol (ROBAXIN) 500 MG tablet TAKE ONE (1) TABLET BY MOUTH 3 TIMES DAILY AS NEEDED FOR MUSCLE SPASMS 90 tablet 1   nystatin (MYCOSTATIN) 100000 UNIT/ML suspension Take 5 mLs (500,000 Units total) by mouth 4 (four) times daily. 120 mL 1   traMADol (ULTRAM) 50 MG tablet      Current Facility-Administered Medications  Medication Dose Route Frequency Provider Last Rate Last Admin   0.9 %  sodium chloride infusion  500 mL Intravenous Once Sharyn Creamer, MD        Allergies as of 08/08/2022 - Review Complete 08/08/2022  Allergen Reaction Noted   Bee pollen Other (See Comments) 05/18/2014   Motrin [ibuprofen]  11/08/2010   Pollen  extract  05/18/2014   Statins  12/04/2013   Sulfonamide derivatives  12/06/2007   Ciprofloxacin Swelling and Rash 11/08/2010    Family History  Problem Relation Age of Onset   Aneurysm Mother        brain   Celiac disease Brother    Diabetes Maternal Grandfather    Hypertension Neg Hx    Breast cancer Neg Hx    Coronary artery disease Neg Hx    Colon cancer  Neg Hx    Esophageal cancer Neg Hx    Stomach cancer Neg Hx    Rectal cancer Neg Hx    Colon polyps Neg Hx     Social History   Socioeconomic History   Marital status: Widowed    Spouse name: Not on file   Number of children: 1   Years of education: Not on file   Highest education level: Doctorate  Occupational History   Occupation: retired VP of Tax inspector: RETIRED  Tobacco Use   Smoking status: Never    Passive exposure: Never   Smokeless tobacco: Never  Vaping Use   Vaping Use: Never used  Substance and Sexual Activity   Alcohol use: Yes    Comment: socially   Drug use: No   Sexual activity: Not Currently  Other Topics Concern   Not on file  Social History Narrative   2 step children, 1 child , 7 GK   Lost husband, moved [place] 02-13-2020 ; apartment, 3th floor w/ elevator      Pt is right handed   Social Determinants of Health   Financial Resource Strain: [place]  (05/17/2021)   Overall Financial Resource Strain (CARDIA)    Difficulty of Paying Living Expenses: Not hard at all  Food Insecurity: No Food Insecurity (07/31/2022)   Hunger Vital Sign    Worried About Running Out of Food in the Last Year: Never true    [place] in the Last Year: Never true  Transportation Needs: No Transportation Needs (07/31/2022)   PRAPARE - Hydrologist (Medical): No    Lack of Transportation (Non-Medical): No  Physical Activity: Sufficiently Active (05/17/2021)   Exercise Vital Sign    Days of Exercise per Week: 4 days    Minutes of Exercise per Session: 40 min   Stress: No Stress Concern Present (05/17/2021)   [place]    Feeling of Stress : Only a little  Social Connections: Moderately Integrated (05/17/2021)   Social Connection and Isolation Panel [NHANES]    Frequency of Communication with Friends and Family: More than three times a week    Frequency of Social Gatherings with Friends and Family: More than three times a week    Attends Religious Services: More than 4 times per year    Active Member of [employer] or Organizations: Yes    Attends Archivist Meetings: More than 4 times per year    Marital Status: Widowed  Intimate Partner Violence: Not At Risk (07/31/2022)   Humiliation, Afraid, Rape, and Kick questionnaire    Fear of Current or Ex-Partner: No    Emotionally Abused: No    Physically Abused: No    Sexually Abused: No    Physical Exam: Vital signs in last 24 hours: BP (!) 156/94   Pulse 74   Temp (!) 97.3 F (36.3 C) (Temporal)   Ht 5\' 4"  (1.626 m)   Wt 166 lb (75.3 kg)   SpO2 97%   BMI 28.49 kg/m  GEN: NAD EYE: Sclerae anicteric ENT: MMM CV: Non-tachycardic Pulm: No increased WOB GI: Soft NEURO:  Alert & Oriented   Christia Reading, MD [facility]   08/08/2022 10:29 AM

## 2022-08-09 ENCOUNTER — Telehealth: Payer: Self-pay | Admitting: *Deleted

## 2022-08-09 NOTE — Telephone Encounter (Signed)
  Follow up Call-    Row Labels 08/08/2022   10:02 AM 04/05/2022    9:06 AM 07/11/2021    7:55 AM  Call back number   Section Header. No data exists in this row.     Post procedure Call Back phone  #   [phone]  Permission to leave phone message   Yes Yes Yes     Patient questions:  Do you have a fever, pain , or abdominal swelling? No. Pain Score  0 *  Have you tolerated food without any problems? Yes.    Have you been able to return to your normal activities? Yes.    Do you have any questions about your discharge instructions: Diet   No. Medications  No. Follow up visit  No.  Do you have questions or concerns about your Care? No.  Actions: * If pain score is 4 or above: No action needed, pain <4.

## 2022-08-10 ENCOUNTER — Encounter: Payer: Self-pay | Admitting: Internal Medicine

## 2022-08-10 MED ORDER — OMEPRAZOLE 40 MG PO CPDR: 40.0000 mg | DELAYED_RELEASE_CAPSULE | Freq: Every day | ORAL | 1 refills | Status: DC

## 2022-08-10 NOTE — Addendum Note (Signed)
Addended by: Sharyn Creamer on: 08/10/2022 12:05 PM   Modules accepted: Orders

## 2022-08-28 ENCOUNTER — Encounter: Payer: Self-pay | Admitting: *Deleted

## 2022-08-30 DIAGNOSIS — L2089 Other atopic dermatitis: Secondary | ICD-10-CM | POA: Diagnosis not present

## 2022-08-30 DIAGNOSIS — L280 Lichen simplex chronicus: Secondary | ICD-10-CM | POA: Diagnosis not present

## 2022-09-25 ENCOUNTER — Other Ambulatory Visit: Payer: Self-pay | Admitting: Internal Medicine

## 2022-09-26 DIAGNOSIS — H26493 Other secondary cataract, bilateral: Secondary | ICD-10-CM | POA: Diagnosis not present

## 2022-09-26 DIAGNOSIS — H401131 Primary open-angle glaucoma, bilateral, mild stage: Secondary | ICD-10-CM | POA: Diagnosis not present

## 2022-09-26 DIAGNOSIS — H353132 Nonexudative age-related macular degeneration, bilateral, intermediate dry stage: Secondary | ICD-10-CM | POA: Diagnosis not present

## 2022-09-26 DIAGNOSIS — H04123 Dry eye syndrome of bilateral lacrimal glands: Secondary | ICD-10-CM | POA: Diagnosis not present

## 2022-09-26 DIAGNOSIS — H524 Presbyopia: Secondary | ICD-10-CM | POA: Diagnosis not present

## 2022-09-26 DIAGNOSIS — H5203 Hypermetropia, bilateral: Secondary | ICD-10-CM | POA: Diagnosis not present

## 2022-09-26 DIAGNOSIS — H43813 Vitreous degeneration, bilateral: Secondary | ICD-10-CM | POA: Diagnosis not present

## 2022-09-26 DIAGNOSIS — Z961 Presence of intraocular lens: Secondary | ICD-10-CM | POA: Diagnosis not present

## 2022-09-26 DIAGNOSIS — H52223 Regular astigmatism, bilateral: Secondary | ICD-10-CM | POA: Diagnosis not present

## 2022-11-14 NOTE — Telephone Encounter (Signed)
Prolia VOB initiated via [email or web address]  Last OV:  Next OV:  Last Prolia inj:  Next Prolia inj DUE: 12/19/22

## 2022-11-20 NOTE — Telephone Encounter (Signed)
Prior auth required for PROLIA  PA PROCESS DETAILS: PA is required. PA can be initiated by calling [phone] or online at [email or web address] 

## 2022-11-29 NOTE — Telephone Encounter (Signed)
Prior Authorization initiated for PROLIA via [email or web address] KEY: [id number]

## 2022-12-05 NOTE — Telephone Encounter (Signed)
Pt ready for scheduling on or after 12/19/22  Out-of-pocket cost due at time of visit: $35  Primary: [employer] SHP PPO Prolia co-insurance: $35 Admin fee co-insurance: $35   Deductible: does not apply  Prior Auth APPROVED PA# [id number] Valid: 05/15/22-05/15/23  Secondary: N/A Prolia co-insurance:  Admin fee co-insurance:  Deductible:  Prior Auth:  PA# Valid:     ** This summary of benefits is an estimation of the patient's out-of-pocket cost. Exact cost may very based on individual plan coverage.

## 2022-12-05 NOTE — Telephone Encounter (Signed)
Prior Auth APPROVED PA# [id number] Valid: 05/15/22-05/15/23

## 2022-12-08 ENCOUNTER — Other Ambulatory Visit: Payer: Self-pay | Admitting: Internal Medicine

## 2022-12-12 ENCOUNTER — Ambulatory Visit: Payer: Medicare PPO | Admitting: Sports Medicine

## 2022-12-13 ENCOUNTER — Ambulatory Visit: Payer: Medicare PPO | Admitting: Sports Medicine

## 2022-12-13 VITALS — BP 124/70 | Ht 64.0 in | Wt 165.0 lb

## 2022-12-13 DIAGNOSIS — M5030 Other cervical disc degeneration, unspecified cervical region: Secondary | ICD-10-CM | POA: Diagnosis not present

## 2022-12-13 DIAGNOSIS — M25562 Pain in left knee: Secondary | ICD-10-CM | POA: Diagnosis not present
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
## 2022-12-19 ENCOUNTER — Ambulatory Visit: Payer: Medicare PPO | Admitting: Family Medicine

## 2022-12-19 VITALS — Ht 64.0 in

## 2022-12-19 DIAGNOSIS — M810 Age-related osteoporosis without current pathological fracture: Secondary | ICD-10-CM | POA: Diagnosis not present

## 2022-12-19 MED ORDER — DENOSUMAB 60 MG/ML ~~LOC~~ SOSY
60.0000 mg | PREFILLED_SYRINGE | Freq: Once | SUBCUTANEOUS | Status: AC
  Administered 2022-12-19: 60 mg via SUBCUTANEOUS

## 2022-12-19 NOTE — Progress Notes (Signed)
Patient given [place] prolia injection 60mg/ml in the left arm. Patient tolerated injection well without reaction at the injection site. Patient will schedule next injection, which is 6 months from today. 

## 2022-12-27 NOTE — Telephone Encounter (Addendum)
Last Prolia inj 12/19/22 Next Prolia inj due 06/22/23

## 2022-12-28 ENCOUNTER — Encounter (INDEPENDENT_AMBULATORY_CARE_PROVIDER_SITE_OTHER): Payer: Self-pay

## 2023-01-01 ENCOUNTER — Other Ambulatory Visit: Payer: Self-pay | Admitting: Oncology

## 2023-01-01 DIAGNOSIS — Z006 Encounter for examination for normal comparison and control in clinical research program: Secondary | ICD-10-CM

## 2023-01-02 ENCOUNTER — Encounter: Payer: Self-pay | Admitting: Internal Medicine

## 2023-01-19 ENCOUNTER — Encounter: Payer: Self-pay | Admitting: Internal Medicine

## 2023-01-19 ENCOUNTER — Ambulatory Visit: Payer: Medicare PPO | Admitting: Internal Medicine

## 2023-01-19 VITALS — BP 126/64 | HR 67 | Temp 98.1°F | Resp 16 | Ht 64.0 in | Wt 169.0 lb

## 2023-01-19 DIAGNOSIS — Z006 Encounter for examination for normal comparison and control in clinical research program: Secondary | ICD-10-CM

## 2023-01-19 DIAGNOSIS — Z792 Long term (current) use of antibiotics: Secondary | ICD-10-CM | POA: Diagnosis not present

## 2023-01-19 DIAGNOSIS — E785 Hyperlipidemia, unspecified: Secondary | ICD-10-CM | POA: Diagnosis not present

## 2023-01-19 LAB — BASIC METABOLIC PANEL
BUN: 15 mg/dL (ref 6–23)
CO2: 28 meq/L (ref 19–32)
Calcium: 9.5 mg/dL (ref 8.4–10.5)
Chloride: 99 meq/L (ref 96–112)
Creatinine, Ser: 0.92 mg/dL (ref 0.40–1.20)
GFR: 59.61 mL/min — ABNORMAL LOW (ref >60.00)
Glucose, Bld: 77 mg/dL (ref 70–99)
Potassium: 4.5 meq/L (ref 3.5–5.1)
Sodium: 135 meq/L (ref 135–145)

## 2023-01-19 MED ORDER — ZOLPIDEM TARTRATE 5 MG PO TABS: 5.0000 mg | ORAL_TABLET | Freq: Every evening | ORAL | 1 refills | Status: DC | PRN

## 2023-01-19 NOTE — Patient Instructions (Addendum)
Vaccines I recommend: Tdap (tetanus) Flu shot this fall Covid booster- new this fall     GO TO THE LAB : Get the blood work     GO TO THE FRONT DESK, PLEASE SCHEDULE YOUR APPOINTMENTS Come back for    for a physical exam by 07-2022

## 2023-01-19 NOTE — Progress Notes (Signed)
Subjective:    Patient ID: Cynthia [name], female    DOB: Dec 05, 1945, 77 y.o.   MRN: [id number]  DOS:  01/19/2023 Type of visit - description: ROV  Admits to some stress. Reports back pain is giving her trouble lately. Had a EGD, results reviewed. Needs Ambien refill, she takes it infrequently.  Wt Readings from Last 3 Encounters:  01/19/23 169 lb (76.7 kg)  12/13/22 165 lb (74.8 kg)  08/08/22 166 lb (75.3 kg)     Review of Systems See above   Past Medical History:  Diagnosis Date   Allergy    Anemia    Anxiety    Arthritis    Blood transfusion without reported diagnosis    Cataract    Constipation, chronic    DEGENERATIVE JOINT DISEASE 06/02/2010   Depression    GERD (gastroesophageal reflux disease)    Glaucoma 05/15/2012   Right eye   Headache(784.0)    Hyperlipidemia    hx of   Osteopenia    Osteoporosis    Psoriasis    PUD (peptic ulcer disease) 05/15/2010   Upper GI bleed d/t NSAIDs   RLS (restless legs syndrome)    robaxin prn   TIA (transient ischemic attack) 09/2019    Past Surgical History:  Procedure Laterality Date   ANKLE FRACTURE SURGERY Left    surgery 12-11 (LEFT)   CESAREAN SECTION  05/16/1967   COLONOSCOPY  12/17/2013   EYE SURGERY  2020   cataract removal   FRACTURE SURGERY  2012   fracture in foot   JOINT REPLACEMENT  August, 2023   Left knee   TOTAL KNEE ARTHROPLASTY Left 12/28/2021   UPPER GASTROINTESTINAL ENDOSCOPY      Current Outpatient Medications  Medication Instructions   aspirin EC 81 mg, Oral, Daily, Swallow whole.   buPROPion (WELLBUTRIN XL) 300 mg, Oral, Daily   cetirizine (ZYRTEC) 10 mg, Oral, At bedtime PRN,     cholecalciferol (VITAMIN D3) 10 MCG (400 UNIT) TABS tablet Oral   ezetimibe (ZETIA) 10 mg, Oral, Daily   gabapentin (NEURONTIN) 100 mg, Oral, Daily at bedtime   ketoconazole (NIZORAL) 2 % cream 1 Application, Topical, Daily   methocarbamol (ROBAXIN) 500 MG tablet TAKE ONE (1) TABLET BY MOUTH 3 TIMES  DAILY AS NEEDED FOR MUSCLE SPASMS   Multiple Vitamins-Minerals (ICAPS AREDS 2 PO) Oral   nystatin (MYCOSTATIN) 500,000 Units, Oral, 4 times daily   omeprazole (PRILOSEC) 40 mg, Oral, Daily   propranolol (INDERAL) 10 mg, Oral, 3 times daily   sertraline (ZOLOFT) 200 mg, Oral, Daily   traMADol (ULTRAM) 50 MG tablet    Travoprost, BAK Free, (TRAVATAN) 0.004 % SOLN ophthalmic solution 1 drop, Right Eye, Daily at bedtime   zolpidem (AMBIEN) 5 mg, Oral, At bedtime PRN       Objective:   Physical Exam BP 126/64   Pulse 67   Temp 98.1 F (36.7 C) (Oral)   Resp 16   Ht 5\' 4"  (1.626 m)   Wt 169 lb (76.7 kg)   SpO2 98%   BMI 29.01 kg/m  General:   Well developed, NAD, BMI noted. HEENT:  Normocephalic . Face symmetric, atraumatic Lungs:  CTA B Normal respiratory effort, no intercostal retractions, no accessory muscle use. Heart: RRR,  no murmur.  Lower extremities: no pretibial edema bilaterally  Skin: Not pale. Not jaundice Neurologic:  alert & oriented X3.  Speech normal, gait appropriate for age and unassisted Psych--  Cognition and judgment appear intact.  Cooperative  with normal attention span and concentration.  Behavior appropriate. No anxious or depressed appearing.      Assessment     Assessment Hyperlipidemia: Lipitor causes myalgias, declined other statins Anxiety depression insomnia DJD Migraines, chronic: Saw neurology 12/01/2020: Failed verapamil, nortriptyline and topiramate. Rx Emgality.  Tramadol prn TIA: 2021 Admitted to the hospital with left-sided numbness, symptoms of resolved, work-up included: 2D echo normal with grade 1 diastolic dysfunction.  No PFO or thrombus. MRA head no acute, 2 mm anterior communicating artery aneurysm. MRA neck normal LDL 162.  Sodium 133, slightly low.    Neurology notes: TIA versus complicated migraine, recommended aspirin and Plavix for 3 weeks, then aspirin. Osteoporosis: 07-2014 T score of -1.7, T score (November 2021)  -2.8: sports medicine Rx Prolia PUD due to NSAIDs RLS - robaxin  prn Neuropathy: dx sports meds  Glaucoma Chronic constipation Skin psoriasis , Dx LSC (lichen )  ~ 2014 , sees derm   PLAN: Hyperlipidemia: Only on Zetia, intolerant to Lipitor and will not try other statins.  Continue present care. Anxiety depression insomnia: Takes Ambien rarely, prescription sent.  On Wellbutrin and sertraline. Admits to some stress, counseling provided. DJD: Lately having back pain, takes gabapentin at night, declined need to increase the dose Takes tramadol very rarely. Recommend to continue working with sports medicine if needed. Amoxicillin predental procedures?  Patient requesting my opinion, I recently reviewed the topic,  there is no indication for prophylactic antibiotics. Esophagitis: EGD 08/08/2022 done for dysphagia.BX: Eosinophilic esophagitis   under good control Vaccine advice provided. RTC 07/2023 for CPX

## 2023-01-19 NOTE — Assessment & Plan Note (Signed)
Hyperlipidemia: Only on Zetia, intolerant to Lipitor and will not try other statins.  Continue present care. Anxiety depression insomnia: Takes Ambien rarely, prescription sent.  On Wellbutrin and sertraline. Admits to some stress, counseling provided. DJD: Lately having back pain, takes gabapentin at night, declined need to increase the dose Takes tramadol very rarely. Recommend to continue working with sports medicine if needed. Amoxicillin predental procedures?  Patient requesting my opinion, I recently reviewed the topic,  there is no indication for prophylactic antibiotics. Esophagitis: EGD 08/08/2022 done for dysphagia.BX: Eosinophilic esophagitis   under good control Vaccine advice provided. RTC 07/2023 for CPX

## 2023-01-26 ENCOUNTER — Other Ambulatory Visit: Payer: Self-pay | Admitting: Internal Medicine

## 2023-01-26 ENCOUNTER — Other Ambulatory Visit: Payer: Self-pay | Admitting: Neurology

## 2023-01-30 LAB — GENECONNECT MOLECULAR SCREEN: Genetic Analysis Overall Interpretation: NEGATIVE

## 2023-03-14 NOTE — Progress Notes (Signed)
NEUROLOGY FOLLOW UP OFFICE NOTE  Cynthia [name] [id number]  Assessment/Plan:   1.  Cervicogenic headache with cervical spondylosis 2.  Migraine without aura, without status migrainosus, not intractable - improved  4.  Transient left hemisensory loss.  TIA vs migraine.  As no prior history, must assume TIA. 5.  Small Acomm aneurysm   1.  Increase gabapentin to 200mg  at bedtime.  We can increase dose in 4 weeks if needed.  Ultimately, we can order MRI of cervical spine . 2. Follow up in 6 months.   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  Migraines are controlled and are infrequent.  However, she has been experiencing more cervicogenic headaches since the summer.  Pain radiating up both sides of her neck to the posterior head.  Typically occurs when she wakes up in bed or later in the day.  No numbness, weakness or pain in the arms.    Current NSAIDS:  ASA 81mg  daily Current analgesics:  cetaminophen-caffeine Current triptans:  none Current ergotamine:  no Current anti-emetic: Zofran Current muscle relaxants:  Robaxin 500mg  TID (rarely) Current anti-anxiolytic:  no Current sleep aide:  Ambien Current Antihypertensive medications:  verapamil CR 180mg  Current Antidepressant medications:  Sertraline 100mg , bupropion 300mg  Current Anticonvulsant medications:  gabapentin 100mg  at bedtime Current anti-CGRP: none Current Vitamins/Herbal/Supplements:  B complex, Mg Current Antihistamines/Decongestants:  Mucinex, Astelin Other therapy: 1 cup black coffee with melatonin 10mg  at bedtime Other medication: Zetia   No subsequent stroke-like symptoms.   Depression and anxiety: Yes Other pain: Joint pain   HISTORY:   She started having headaches infrequently in 2015 but progressively became more frequent in 2016.  Varies, but they are usually left sided, involving the ear, but also may be right sided (involving the sinuses) or in band-like distribution.  It is both throbbing and non-throbbing.  Initially, it is usually 5/10 but severe episodes are 7-8/10.  Sometimes there is nausea.  There is phonophobia.  There is no associated photophobia, visual disturbance or unilateral numbness or weakness.  Initially, it lasts a couple of hours and occuring 15 days out of the month (3 to 4 days severe).  It wakes her up at 4:30 to 5 AM.  She reports sensation of congestion but no runny nose.  CT of sinuses showed opacified right sphenoid sinus and ethmoid air cell.  She was treated a couple of times with Augmentin and prednisone, which was effective for a while but headaches then returned.  She cannot think of a trigger.  They are worse during the summer.  There are no aggravating or relieving factors.  MRI of brain from 06/10/15 was normal.  Sed Rate from 12/27/15 was 10.     She was admitted to [facility] on 09/14/2019 for TIA presenting as transient left sided hemibody numbness (first leg, then arm, then face).  Mostly resolved quickly but lingered for several hours.  CT head showed right internal capsule hypodensity but follow up MRI of brain showed no acute infarct.  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  2D echo showed EF 60-65% with no source of embolus.  LDL was 162 and Hgb A1c was 5.2.  She was started on ASA 81mg  daily and Plavix 75mg   for 3 weeks followed by ASA alone.  The next morning she had a habitual migraine headache except it lasted all day.  She was also started on Zetia (she has statin intolerance).  Unclear if event was TIA or complicated migraine.   Due to worsening headache, ordered an MRA of head .  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   She notes another headache across the back of her head when she lays down.  She does have neck pain with cervical spondylosis.  Around December 2022, she started experiencing severe paroxysmal ear pain, mostly right ear but also left ear, every other day.  No aural fullness.  No change in tinnitus.  Hearing slightly worse but not significant.  Sometimes jaw pain. Cervical X-ray from 08/23/2021 revealed multilevel degenerative changes with mild degenerative changes with mild degenerative anterolisthesis of C3 on C4 and disc space narrowing with osteophytic changes at C5-6 and C6-7 but the odontoid was within normal limits.  Chiropractic therapy and acupuncture were ineffective.     Past NSAIDS:  ibuprofen (ulcers) Past analgesics:  Tylenol, tramadol Past abortive triptans:  sumatriptan (advised to discontinue due to age) Past abortive ergotamine:  no Past muscle relaxants:  no Past anti-emetic:  no Past antihypertensive medications:  no Past antidepressant medications:  Nortriptyline 50mg  Past anticonvulsant medications:  topiramate 25mg  (concerned about glaucoma) Past anti-CGRP:  Emgality (effective but no longer covered by insurance) Past vitamins/Herbal/Supplements:  no Past antihistamines/decongestants:  no Other past therapies:  PT of neck  PAST MEDICAL HISTORY: Past Medical History:  Diagnosis Date   Allergy    Anemia    Anxiety    Arthritis    Blood transfusion without reported diagnosis    Cataract    Constipation, chronic    DEGENERATIVE JOINT DISEASE 06/02/2010   Depression    GERD (gastroesophageal reflux disease)    Glaucoma 05/15/2012   Right eye   Headache(784.0)    Hyperlipidemia    hx of   Osteopenia    Osteoporosis    Psoriasis    PUD (peptic ulcer disease) 05/15/2010   Upper GI bleed d/t NSAIDs   RLS (restless legs syndrome)    robaxin prn   TIA (transient ischemic attack) 09/2019    MEDICATIONS: Current Outpatient Medications on File  Prior to Visit  Medication Sig Dispense Refill   aspirin EC 81 MG tablet Take 81 mg by mouth daily. Swallow whole.     buPROPion (WELLBUTRIN XL) 300 MG 24 hr tablet Take 1 tablet (300 mg total) by mouth daily. 90 tablet 1   cetirizine (ZYRTEC) 10 MG tablet Take 10 mg by mouth at bedtime as needed for allergies.     cholecalciferol (VITAMIN D3) 10 MCG (400 UNIT) TABS tablet Take by mouth.     ezetimibe (ZETIA) 10 MG tablet Take 1 tablet (10 mg total) by mouth daily. 90 tablet 1   gabapentin (NEURONTIN) 100 MG capsule Take 1 capsule (100 mg total) by mouth at bedtime. 30 capsule 2   ketoconazole (NIZORAL) 2 % cream Apply 1 Application topically daily. 30 g 0   methocarbamol (ROBAXIN) 500 MG tablet TAKE ONE (1) TABLET BY MOUTH 3 TIMES DAILY AS NEEDED FOR MUSCLE SPASMS 90 tablet 1   Multiple Vitamins-Minerals (ICAPS AREDS 2 PO) Take by mouth.     nystatin (MYCOSTATIN) 100000 UNIT/ML suspension Take 5 mLs (500,000 Units total) by mouth 4 (four) times daily. 120 mL  1   omeprazole (PRILOSEC) 40 MG capsule Take 1 capsule (40 mg total) by mouth daily. 90 capsule 1   propranolol (INDERAL) 10 MG tablet Take 1 tablet (10 mg total) by mouth 3 (three) times daily. 270 tablet 2   sertraline (ZOLOFT) 100 MG tablet Take 2 tablets (200 mg total) by mouth daily. 180 tablet 1   traMADol (ULTRAM) 50 MG tablet      Travoprost, BAK Free, (TRAVATAN) 0.004 % SOLN ophthalmic solution Place 1 drop into the right eye at bedtime.     zolpidem (AMBIEN) 5 MG tablet Take 1 tablet (5 mg total) by mouth at bedtime as needed for sleep. 30 tablet 1   No current facility-administered medications on file prior to visit.    ALLERGIES: Allergies  Allergen Reactions   Bee Pollen Other (See Comments)   Motrin [Ibuprofen]     GI bleed   Pollen Extract    Statins     "delibitating pain all over body"   Sulfonamide Derivatives     Pt unsure of reaction   Ciprofloxacin Swelling and Rash    FAMILY HISTORY: Family History   Problem Relation Age of Onset   Aneurysm Mother        brain   Celiac disease Brother    Diabetes Maternal Grandfather    Hypertension Neg Hx    Breast cancer Neg Hx    Coronary artery disease Neg Hx    Colon cancer Neg Hx    Esophageal cancer Neg Hx    Stomach cancer Neg Hx    Rectal cancer Neg Hx    Colon polyps Neg Hx       Objective:  Blood pressure 126/77, pulse 84, height 5\' 4"  (1.626 m), weight 173 lb (78.5 kg), SpO2 96%. General: No acute distress.  Patient appears well-groomed.   Head:  Normocephalic/atraumatic Eyes:  Fundi examined but not visualized Neck: supple, no paraspinal tenderness, full range of motion Heart:  Regular rate and rhythm Neurological Exam: alert and oriented.  Speech fluent and not dysarthric, language intact.  CN II-XII intact. Bulk and tone normal, muscle strength 5/5 throughout.  Sensation to light touch intact.  Deep tendon reflexes 2+ throughout.  Finger to nose testing intact.  Gait normal, Romberg negative.   Shon Millet, DO  CC: Willow Ora, MD

## 2023-03-16 ENCOUNTER — Ambulatory Visit: Payer: Medicare PPO | Admitting: Neurology

## 2023-03-16 ENCOUNTER — Encounter: Payer: Self-pay | Admitting: Neurology

## 2023-03-16 VITALS — BP 126/77 | HR 84 | Ht 64.0 in | Wt 173.0 lb

## 2023-03-16 DIAGNOSIS — G4486 Cervicogenic headache: Secondary | ICD-10-CM

## 2023-03-16 DIAGNOSIS — M47812 Spondylosis without myelopathy or radiculopathy, cervical region: Secondary | ICD-10-CM

## 2023-03-16 MED ORDER — GABAPENTIN 100 MG PO CAPS: 200.0000 mg | ORAL_CAPSULE | Freq: Every day | ORAL | 5 refills | Status: DC

## 2023-03-16 NOTE — Patient Instructions (Signed)
Increase gabapentin to 200mg  at bedtime.  If no improvement in 4 weeks, contact me and we can still increase dose

## 2023-03-23 NOTE — Telephone Encounter (Signed)
Prolia PA updated via CoverMyMeds Key: [id number]; PA Case ID #: [id number]

## 2023-03-27 NOTE — Telephone Encounter (Signed)
Prolia PA approved 05/15/22 to 05/14/24.  [employer] Reference number: [id number].

## 2023-03-30 DIAGNOSIS — H353132 Nonexudative age-related macular degeneration, bilateral, intermediate dry stage: Secondary | ICD-10-CM | POA: Diagnosis not present

## 2023-03-30 DIAGNOSIS — H401131 Primary open-angle glaucoma, bilateral, mild stage: Secondary | ICD-10-CM | POA: Diagnosis not present

## 2023-03-30 DIAGNOSIS — H04123 Dry eye syndrome of bilateral lacrimal glands: Secondary | ICD-10-CM | POA: Diagnosis not present

## 2023-03-30 DIAGNOSIS — H3554 Dystrophies primarily involving the retinal pigment epithelium: Secondary | ICD-10-CM | POA: Diagnosis not present

## 2023-03-30 DIAGNOSIS — H35363 Drusen (degenerative) of macula, bilateral: Secondary | ICD-10-CM | POA: Diagnosis not present

## 2023-05-02 ENCOUNTER — Ambulatory Visit: Payer: Medicare PPO | Admitting: Family Medicine

## 2023-05-02 ENCOUNTER — Ambulatory Visit: Payer: Self-pay | Admitting: Internal Medicine

## 2023-05-02 VITALS — BP 118/78 | HR 89 | Temp 98.5°F | Ht 64.0 in | Wt 173.0 lb

## 2023-05-02 DIAGNOSIS — R051 Acute cough: Secondary | ICD-10-CM

## 2023-05-02 DIAGNOSIS — H9201 Otalgia, right ear: Secondary | ICD-10-CM

## 2023-05-02 DIAGNOSIS — J069 Acute upper respiratory infection, unspecified: Secondary | ICD-10-CM

## 2023-05-02 MED ORDER — BENZONATATE 100 MG PO CAPS
100.0000 mg | ORAL_CAPSULE | Freq: Two times a day (BID) | ORAL | 0 refills | Status: DC | PRN
Start: 2023-05-02 — End: 2023-07-24

## 2023-05-02 MED ORDER — AMOXICILLIN-POT CLAVULANATE 875-125 MG PO TABS
1.0000 | ORAL_TABLET | Freq: Two times a day (BID) | ORAL | 0 refills | Status: AC
Start: 2023-05-02 — End: 2023-05-09

## 2023-05-02 MED ORDER — FLUTICASONE PROPIONATE 50 MCG/ACT NA SUSP
2.0000 | Freq: Every day | NASAL | 6 refills | Status: AC
Start: 2023-05-02 — End: ?

## 2023-05-02 NOTE — Telephone Encounter (Signed)
Copied from CRM [id number]. Topic: Clinical - Red Word Triage >> May 02, 2023  8:37 AM Larwance Sachs wrote: Red Word that prompted transfer to Nurse Triage: Patient has a very bad cold with aches all over body, stated she was in very bad pain last night aches and headaches. Has been coughing, sneezing and coughing up a thick green mucus, also experiencing neck pain  Chief Complaint: Cold Symptoms Symptoms: Cough with green mucous, sneezing, runny nose with green mucous, congestion, sinus pressure, earaches in both but worse in the right Frequency: x 3 days (since Monday) Pertinent Negatives: Patient denies Fevers or difficulty breathing Disposition: [] ED /[] Urgent Care (no appt availability in office) / [x] Appointment(In office/virtual)/ []  [facility]/ [] Home Care/ [] Refused Recommended Disposition /[] [place] [facility]/ []  Follow-up with PCP Additional Notes: Patient called and advised that she has had cold symptoms since Monday(3 days ago).  She advised that she tested negative for covid and flu yesterday morning and  is still feeling terrible.  She states that she has been taking medications and not getting better.    She states she has body aches, pressure/soreness in her head and neck, and some diarrhea as well.  Pt also states she had some chills yesterday and has a sore throat. She also has bilateral ear aches and states the right feels worse than the left. Care advice if given to patient by this RN and an appointment is made for today 05/02/23 at the office at 11:40am.  Patient is also advised that if anything worsens to either call us back or go to the emergency room.  Patient verbalized understanding.  Reason for Disposition  Earache  Answer Assessment - Initial Assessment Questions 1. ONSET: "When did the nasal discharge start?"      Monday (3 days) 2. AMOUNT: "How much discharge is there?"      Yes but the mucous is so thick 3. COUGH: "Do you have a cough?" If Yes, ask:  "Describe the color of your sputum" (clear, white, yellow, green)     Cough and green mucous 4. RESPIRATORY DISTRESS: "Describe your breathing."      "I can breathe but I am terribly stopped up" 5. FEVER: "Do you have a fever?" If Yes, ask: "What is your temperature, how was it measured, and when did it start?"     "I dont think I have" 6. SEVERITY: "Overall, how bad are you feeling right now?" (e.g., doesn't interfere with normal activities, staying home from school/work, staying in bed)      "Staying home since Monday afternoon" 7. OTHER SYMPTOMS: "Do you have any other symptoms?" (e.g., sore throat, earache, wheezing, vomiting)     Chills, earaches--both but worse in right  Protocols used: Common Cold-A-AH

## 2023-05-02 NOTE — Patient Instructions (Addendum)
Likely Viral Upper Respiratory Infection  Continue supportive measures including rest, hydration, humidifier use, steam showers, warm compresses to sinuses, warm liquids with lemon and honey, and over-the-counter cough, cold, and analgesics as needed. Sending in a watch and wait antibiotic in case your symptoms worsen over the next few days. Take the antibiotic with food and water.   Over the counter medications that may be helpful for symptoms:  Guaifenesin 1200 mg extended release tabs twice daily, with plenty of water For cough and congestion Brand name: Mucinex   Pseudoephedrine 30 mg, one or two tabs every 4 to 6 hours For sinus congestion Brand name: Sudafed You must get this from the pharmacy counter.  Oxymetazoline nasal spray each morning, one spray in each nostril, for NO MORE THAN 3 days  For nasal and sinus congestion Brand name: Afrin Saline nasal spray or Saline Nasal Irrigation (Netti Pot, etc) 3-5 times a day For nasal and sinus congestion Brand names: Ocean or AYR Fluticasone nasal spray OR Mometasone nasal spray OR Triamcinolone Acetonide nasal spray - follow directions on the packaging For nasal and sinus congestion Brand name: Flonase, Nasonex, Nasacort Warm salt water gargles  For sore throat Every few hours as needed Alternate ibuprofen 400-600 mg and acetaminophen 1000 mg every 6 hours For fever, body aches, headache Brand names: Motrin or Advil and Tylenol Dextromethorphan 12-hour cough version 30 mg every 12 hours  For cough Brand name: Delsym Stop all other cold medications for now (Nyquil, Dayquil, Tylenol Cold, Theraflu, etc) and other non-prescription cough/cold preparations. Many of these have the same ingredients listed above and could cause an overdose of medication.   Herbal treatments that have been shown to be helpful in some patients include: Vitamin C 1000 mg per day Zinc 100 mg per day Quercetin 25-500 mg twice a day Melatonin 5-10mg  at  bedtime Honey Green Tea  General Instructions Allow your body to rest Drink PLENTY of fluids Typically, we are the most contagious 1-2 days before symptoms start through the first 2-3 days of most severe symptoms. Per CDC guidelines, you can return to school/work when symptoms have started to improve and you have been fever-free for 24 hours. However, recommend you continue extra precautions for the following 5 days (frequent hand hygiene, masking, covering coughs/sneezes, minimize exposure to immunocompromised individuals, etc).  If you develop severe shortness of breath, uncontrolled fevers, coughing up blood, confusion, chest pain, or signs of dehydration (such as significantly decreased urine amounts or dizziness with standing) please go to the nearest ER.

## 2023-05-02 NOTE — Telephone Encounter (Signed)
Appt scheduled w/ Taylor 

## 2023-05-02 NOTE — Progress Notes (Signed)
Acute Office Visit  Subjective:     Patient ID: Cynthia [name], female    DOB: [birth year], 77 y.o.   MRN: [id number]  Chief Complaint  Patient presents with   Generalized Body Aches    HPI Patient is in today for URI symptoms.   Discussed the use of AI scribe software for clinical note transcription with the patient, who gave verbal consent to proceed.  History of Present Illness   The patient, residing at [facility] independent living facility, reports feeling unwell for the past three to four days. The onset of symptoms was sudden, with the patient initially experiencing a sensation of eye-crossing and heaviness. This was followed by generalized body aches, coughing, sneezing, and chills. The patient is unsure about the presence of fever, as they have not checked their temperature.  The patient's symptoms have been severe, with a particularly intense headache and an inability to move due to the severity of the body aches. The patient has attempted to manage these symptoms with over-the-counter medications, including Tylenol and Mucinex. However, due to the severity of the body aches, the patient also took methocarbamol and tramadol, which provided minimal relief. To manage the persistent symptoms and facilitate sleep, the patient took a sleeping pill.  The patient also reports sinus congestion, with a sensation of fullness and pressure, leading to a toothache-like pain, which they attribute to the sinus congestion. This discomfort is bilateral but more pronounced on the right side. The patient also reports earache in both ears, again more severe on the right side, for which they used a heating pad for relief.  The patient denies any known exposure to sick individuals but acknowledges the potential for exposure due to their living situation. They also deny chest pain, dyspnea and hemoptysis but report feeling winded and wiped out. In the days leading up to the onset of the severe symptoms,  the patient experienced body aches and used a heated neck wrap for relief.            ROS All review of systems negative except what is listed in the HPI      Objective:    BP 118/78   Pulse 89   Temp 98.5 F (36.9 C) (Oral)   Ht 5\' 4"  (1.626 m)   Wt 173 lb (78.5 kg)   SpO2 95%   BMI 29.70 kg/m    Physical Exam Vitals reviewed.  Constitutional:      Appearance: Normal appearance.  HENT:     Head: Normocephalic and atraumatic.     Right Ear: A middle ear effusion is present. Tympanic membrane is bulging.     Left Ear: A middle ear effusion is present.     Nose: Congestion present.     Mouth/Throat:     Comments: Postnasal drainage Cardiovascular:     Rate and Rhythm: Normal rate and regular rhythm.  Pulmonary:     Effort: Pulmonary effort is normal.     Breath sounds: Normal breath sounds. No wheezing, rhonchi or rales.  Skin:    General: Skin is warm.  Neurological:     Mental Status: She is alert and oriented to person, place, and time.  Psychiatric:        Mood and Affect: Mood normal.        Behavior: Behavior normal.        Thought Content: Thought content normal.        Judgment: Judgment normal.     No results found  for any visits on 05/02/23.      Assessment & Plan:   Problem List Items Addressed This Visit   None Visit Diagnoses       Upper respiratory tract infection, unspecified type    -  Primary   Relevant Medications   fluticasone (FLONASE) 50 MCG/ACT nasal spray   benzonatate (TESSALON) 100 MG capsule     Right ear pain       Relevant Medications   fluticasone (FLONASE) 50 MCG/ACT nasal spray   amoxicillin-clavulanate (AUGMENTIN) 875-125 MG tablet     Acute cough       Relevant Medications   benzonatate (TESSALON) 100 MG capsule     Likely Viral Upper Respiratory Infection  Continue supportive measures including rest, hydration, humidifier use, steam showers, warm compresses to sinuses, warm liquids with lemon and honey, and  over-the-counter cough, cold, and analgesics as needed. Adding Flonase and Tessalon.  Sending in a watch and wait antibiotic in case your symptoms worsen over the next few days. Take the antibiotic with food and water.   Meds ordered this encounter  Medications   fluticasone (FLONASE) 50 MCG/ACT nasal spray    Sig: Place 2 sprays into both nostrils daily.    Dispense:  16 g    Refill:  6    Supervising Provider:   Danise Edge A [[id number]]   benzonatate (TESSALON) 100 MG capsule    Sig: Take 1 capsule (100 mg total) by mouth 2 (two) times daily as needed for cough.    Dispense:  20 capsule    Refill:  0    Supervising Provider:   Danise Edge A [[id number]]   amoxicillin-clavulanate (AUGMENTIN) 875-125 MG tablet    Sig: Take 1 tablet by mouth 2 (two) times daily for 7 days.    Dispense:  14 tablet    Refill:  0    Supervising Provider:   Danise Edge A [[id number]]    Return if symptoms worsen or fail to improve.  Clayborne Dana, NP

## 2023-05-19 NOTE — Telephone Encounter (Signed)
 Prolia VOB initiated via [email or web address]  Next Prolia inj DUE:06/22/23

## 2023-05-31 NOTE — Telephone Encounter (Addendum)
Patient is ready for scheduling on or after: 06/22/23 BUY AND BILL  Out-of-pocket cost due at time of visit: $35  Primary:[employer] Prolia co-insurance: 20% (approximately $331.87) Admin fee co-insurance: 20% (approximately $25)  Deductible: does not apply   Prior Auth: Initiated via CoverMyMeds Key: [id number] Approved: 05/15/22 to 05/14/24 Ref #: [id number]    ** This summary of benefits is an estimation of the patient's out-of-pocket cost. Exact cost may vary based on individual plan coverage.

## 2023-06-04 ENCOUNTER — Ambulatory Visit: Payer: Medicare PPO | Admitting: Family Medicine

## 2023-06-04 VITALS — BP 124/70 | Ht 64.0 in | Wt 166.0 lb

## 2023-06-04 DIAGNOSIS — G8929 Other chronic pain: Secondary | ICD-10-CM | POA: Diagnosis not present

## 2023-06-04 DIAGNOSIS — M542 Cervicalgia: Secondary | ICD-10-CM
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
## 2023-06-10 ENCOUNTER — Ambulatory Visit
Admission: RE | Admit: 2023-06-10 | Discharge: 2023-06-10 | Disposition: A | Discharge status: Home / Self Care | Payer: Medicare PPO | Source: Ambulatory Visit | Attending: Family Medicine | Admitting: Family Medicine

## 2023-06-10 ENCOUNTER — Other Ambulatory Visit: Payer: Self-pay

## 2023-06-10 VITALS — BP 126/74 | HR 84 | Temp 98.2°F | Resp 20

## 2023-06-10 DIAGNOSIS — R051 Acute cough: Secondary | ICD-10-CM | POA: Diagnosis not present

## 2023-06-10 DIAGNOSIS — B349 Viral infection, unspecified: Secondary | ICD-10-CM

## 2023-06-10 LAB — POCT INFLUENZA A/B
Influenza A, POC: NEGATIVE
Influenza B, POC: NEGATIVE

## 2023-06-10 MED ORDER — PROMETHAZINE-DM 6.25-15 MG/5ML PO SYRP: 5.0000 mL | ORAL_SOLUTION | Freq: Four times a day (QID) | ORAL | 0 refills | Status: DC | PRN

## 2023-06-10 NOTE — ED Triage Notes (Addendum)
Pt presents with complaints of productive cough, headaches, bilateral ear pain, sore throat, and fatigue x 1 week. Pt currently rates her overall pain a 4/10. Tylenol is the only medication taken for symptoms with no relief. The facility where patient lives did a COVID test yesterday and it was negative. Denies fevers PTA.

## 2023-06-10 NOTE — ED Provider Notes (Signed)
[name]    CSN: [id number] Arrival date & time: 06/10/23  0820      History   Chief Complaint Chief Complaint  Patient presents with   Cough    Productive cough, headache, earache and lethargy for several days now - Entered by patient    HPI [name] is a 78 y.o. female.   The history is provided by the patient.  Cough Associated symptoms: ear pain, headaches, rhinorrhea and sore throat   Associated symptoms: no chest pain, no chills, no shortness of breath and no wheezing   Sick for 1 week symptoms include rhinorrhea, nasal congestion, post drip with green mucus, bilateral ear pain, sore throat, swollen glands, fatigue and headache.  Admits nausea. Denies fever, chest pain, shortness of breath, vomiting, diarrhea.  Lives in a retirement community, had a COVID test yesterday that was negative.  Denies confirmed contacts with flu.  Past Medical History:  Diagnosis Date   Allergy    Anemia    Anxiety    Arthritis    Blood transfusion without reported diagnosis    Cataract    Constipation, chronic    DEGENERATIVE JOINT DISEASE 06/02/2010   Depression    GERD (gastroesophageal reflux disease)    Glaucoma 05/15/2012   Right eye   Headache(784.0)    Hyperlipidemia    hx of   Osteopenia    Osteoporosis    Psoriasis    PUD (peptic ulcer disease) 05/15/2010   Upper GI bleed d/t NSAIDs   RLS (restless legs syndrome)    robaxin prn   TIA (transient ischemic attack) 09/2019    Patient Active Problem List   Diagnosis Date Noted   Sacroiliac joint dysfunction of both sides 08/23/2021   Vaginal discharge 03/28/2021   Stress reaction of bone 11/09/2020   Bone marrow edema 04/12/2020   OA (osteoarthritis) of knee 03/04/2020   Psoriasis 12/19/2019   TIA (transient ischemic attack) 09/14/2019   Insomnia 05/19/2019   Gastroesophageal reflux disease without esophagitis 05/02/2019   Essential tremor 07/25/2017   Lichen 07/24/2017   PCP NOTES  >>>>>>>>>>>>>>>>>>>>>>>>>>>>>>>>>>> 03/25/2015   H/O adenomatous polyp of colon 05/18/2014   Annual physical exam 03/13/2011   Osteoporosis    Osteoarthritis 06/02/2010   Hyperlipidemia 11/09/2009   Migraine headache 06/28/2009   CONSTIPATION, CHRONIC 02/01/2007   Anxiety and depression 08/17/2006   Allergic rhinitis 08/17/2006    Past Surgical History:  Procedure Laterality Date   ANKLE FRACTURE SURGERY Left    surgery 12-11 (LEFT)   CESAREAN SECTION  05/16/1967   COLONOSCOPY  12/17/2013   EYE SURGERY  2020   cataract removal   FRACTURE SURGERY  2012   fracture in foot   JOINT REPLACEMENT  August, 2023   Left knee   TOTAL KNEE ARTHROPLASTY Left 12/28/2021   UPPER GASTROINTESTINAL ENDOSCOPY      OB History     Gravida  1   Para  1   Term      Preterm      AB      Living  1      SAB      IAB      Ectopic      Multiple      Live Births  1            Home Medications    Prior to Admission medications   Medication Sig Start Date End Date Taking? Authorizing Provider  aspirin EC 81 MG tablet Take  81 mg by mouth daily. Swallow whole.    [provider]  benzonatate (TESSALON) 100 MG capsule Take 1 capsule (100 mg total) by mouth 2 (two) times daily as needed for cough. 05/02/23   Clayborne Dana, NP  buPROPion (WELLBUTRIN XL) 300 MG 24 hr tablet Take 1 tablet (300 mg total) by mouth daily. 01/26/23   Wanda Plump, MD  cetirizine (ZYRTEC) 10 MG tablet Take 10 mg by mouth at bedtime as needed for allergies.    [provider]  cholecalciferol (VITAMIN D3) 10 MCG (400 UNIT) TABS tablet Take by mouth. 01/27/13   [provider]  ezetimibe (ZETIA) 10 MG tablet Take 1 tablet (10 mg total) by mouth daily. 01/26/23   Wanda Plump, MD  fluticasone [facility]) 50 MCG/ACT nasal spray Place 2 sprays into both nostrils daily. 05/02/23   Clayborne Dana, NP  gabapentin (NEURONTIN) 100 MG capsule Take 2 capsules (200 mg total) by mouth at bedtime.  03/16/23   Drema Dallas, DO  ketoconazole (NIZORAL) 2 % cream Apply 1 Application topically daily. 07/19/22   Wanda Plump, MD  methocarbamol (ROBAXIN) 500 MG tablet TAKE ONE (1) TABLET BY MOUTH 3 TIMES DAILY AS NEEDED FOR MUSCLE SPASMS 06/10/21   Wanda Plump, MD  Multiple Vitamins-Minerals (ICAPS AREDS 2 PO) Take by mouth.    [provider]  nystatin (MYCOSTATIN) 100000 UNIT/ML suspension Take 5 mLs (500,000 Units total) by mouth 4 (four) times daily. 07/13/22   Olive Bass, FNP  omeprazole (PRILOSEC) 40 MG capsule Take 1 capsule (40 mg total) by mouth daily. 08/10/22   Imogene Burn, MD  propranolol (INDERAL) 10 MG tablet Take 1 tablet (10 mg total) by mouth 3 (three) times daily. 08/01/22   Wanda Plump, MD  sertraline (ZOLOFT) 100 MG tablet Take 2 tablets (200 mg total) by mouth daily. 09/25/22   Wanda Plump, MD  traMADol Janean Sark) 50 MG tablet  11/22/20   [provider]  Travoprost, BAK Free, (TRAVATAN) 0.004 % SOLN ophthalmic solution Place 1 drop into the right eye at bedtime.    [provider]  zolpidem (AMBIEN) 5 MG tablet Take 1 tablet (5 mg total) by mouth at bedtime as needed for sleep. 01/19/23   Wanda Plump, MD    Family History Family History  Problem Relation Age of Onset   Aneurysm Mother        brain   Celiac disease Brother    Diabetes Maternal Grandfather    Hypertension Neg Hx    Breast cancer Neg Hx    Coronary artery disease Neg Hx    Colon cancer Neg Hx    Esophageal cancer Neg Hx    Stomach cancer Neg Hx    Rectal cancer Neg Hx    Colon polyps Neg Hx     Social History Social History   Tobacco Use   Smoking status: Never    Passive exposure: Never   Smokeless tobacco: Never  Vaping Use   Vaping status: Never Used  Substance Use Topics   Alcohol use: Yes    Comment: socially   Drug use: No     Allergies   Bee pollen, Motrin [ibuprofen], Pollen extract, Statins, Sulfonamide derivatives, and Ciprofloxacin   Review of  Systems Review of Systems  Constitutional:  Negative for chills and fatigue.  HENT:  Positive for congestion, ear pain, postnasal drip, rhinorrhea and sore throat. Negative for ear discharge, sinus pain, trouble  swallowing and voice change.   Respiratory:  Positive for cough. Negative for shortness of breath and wheezing.   Cardiovascular:  Negative for chest pain.  Gastrointestinal:  Positive for nausea. Negative for abdominal pain, diarrhea and vomiting.  Neurological:  Positive for headaches.     Physical Exam Triage Vital Signs ED Triage Vitals  Encounter Vitals Group     BP 06/10/23 0827 126/74     Systolic BP Percentile --      Diastolic BP Percentile --      Pulse Rate 06/10/23 0827 84     Resp 06/10/23 0827 20     Temp 06/10/23 0827 98.2 F (36.8 C)     Temp Source 06/10/23 0827 Oral     SpO2 06/10/23 0827 95 %     Weight --      Height --      Head Circumference --      Peak Flow --      Pain Score 06/10/23 0834 4     Pain Loc --      Pain Education --      Exclude from Growth Chart --    No data found.  Updated Vital Signs BP 126/74 (BP Location: Right Arm)   Pulse 84   Temp 98.2 F (36.8 C) (Oral)   Resp 20   SpO2 95%   Visual Acuity Right Eye Distance:   Left Eye Distance:   Bilateral Distance:    Right Eye Near:   Left Eye Near:    Bilateral Near:     Physical Exam Vitals and nursing note reviewed.  Constitutional:      Appearance: She is not ill-appearing.  HENT:     Head: Normocephalic.     Right Ear: Tympanic membrane and ear canal normal.     Left Ear: Tympanic membrane and ear canal normal.     Nose: Congestion present. No rhinorrhea.     Right Sinus: No maxillary sinus tenderness or frontal sinus tenderness.     Left Sinus: No maxillary sinus tenderness or frontal sinus tenderness.     Mouth/Throat:     Mouth: Mucous membranes are moist.     Pharynx: Oropharynx is clear. No oropharyngeal exudate or posterior oropharyngeal erythema.   Eyes:     Conjunctiva/sclera: Conjunctivae normal.  Cardiovascular:     Rate and Rhythm: Normal rate and regular rhythm.     Heart sounds: Normal heart sounds.  Pulmonary:     Effort: Pulmonary effort is normal. No respiratory distress.     Breath sounds: Normal breath sounds. No wheezing or rales.  Musculoskeletal:     Cervical back: Neck supple.  Lymphadenopathy:     Cervical: No cervical adenopathy.  Neurological:     Mental Status: She is alert and oriented to person, place, and time.  Psychiatric:        Mood and Affect: Mood normal.      UC Treatments / Results  Labs (all labs ordered are listed, but only abnormal results are displayed) Labs Reviewed  POCT INFLUENZA A/B    EKG   Radiology No results found.  Procedures Procedures (including critical care time)  Medications Ordered in UC Medications - No data to display  Initial Impression / Assessment and Plan / UC Course  I have reviewed the triage vital signs and the nursing notes.  Pertinent labs & imaging results that were available during my care of the patient were reviewed by me and considered in my medical  decision making (see chart for details).    78 year old female sick for 1 week, tested negative for COVID at her retirement community.  Continues with nasal congestion, postnasal drip, cough.  Denies fever, chest pain, shortness of breath.  She is well-appearing, vital signs are stable there is no sinus tenderness, lungs are clear to auscultation do not suspect pneumonia.  Will check point-of-care flu. Point-of-care flu is negative, states she has been taking Tessalon without relief will give prescription for promethazine, recommend OTC meds for symptomatic relief such as days nasal spray saline spray and acetaminophen.  Warning signs and follow-up reviewed with patient Final Clinical Impressions(s) / UC Diagnoses   Final diagnoses:  Acute cough   Discharge Instructions   None    ED  Prescriptions   None    PDMP not reviewed this encounter.   Meliton Rattan, [place] 06/10/23 [phone]

## 2023-06-10 NOTE — Discharge Instructions (Addendum)
Supportive Care Medications  These are medications that may help with your symptoms. However, Please note that if your PCP has told you not to use certain products please follow his/her recommendations.  For example: - if you have high BP you should use something similar to Coricidin HPB, not Sudafed or antihistamines with a "D" such as Allegra D. The "D" means decongestant.    Fever, Body aches, Headache   Acetaminophen 2 tabs 4 times   Sore Throat: Cepacol throat lozenges or spray  Cough Drops Warm salt water gargles  How to make saltwater rinses Use warm water, because warmth is more relieving to a sore throat than cold water. Warm water will also help the salt dissolve into the water more effectively. Use any type of salt you have available. Most saltwater rinse recipes call for 8 ounces of warm water and 1 teaspoon of salt. However, if your mouth is tender and the saltwater rinse stings, decrease the salt to a 1/2 teaspoon for the first 1 to 2 days. Bring water to a boil, then remove from heat, add salt, and stir. Let the saltwater cool to a warm temperature before rinsing with it. Once you have finished your rinse, discard leftover solution to avoid contamination.  Nasal Congestion: Oral decongestants like sudafed but only if you do not have high Blood Pressure if you have high Blood Pressure take CORICIDIN HBP Antihistamines Nasal Saline/Neti Pot  Cough: Expectorants (guaifenesin) help thin mucus making it easier to get up. This should be used during the day. During the day coughing helps bring mucus up out of your lungs  Oral rehydration is important when you have been sweating more than usual and/or having nausea and vomiting. You can use over the counter rehydration supplements like Pedialyte sport, Gatorlyte, Electrolit, Liquid IV, or you can make your own at home. Recipe:  Mix 1 liter of clean or boiled water with 6 teaspoons (2 tablespoons) of sugar and 1/2 tsp of salt.  Stir until both dissolve.  You can add sugar free flavoring (i.e. Crystal light) if desired.  Drink small sips frequently rather than large amounts at once to prevent nausea.    Herbal treatments that have been shown to be helpful in some patients include: Vitamin C 1000 mg per day Zinc 100 mg per day Quercetin 25-500 mg twice a day Melatonin 5-10mg  at bedtime Honey Green Tea   General Instructions Allow your body to rest Drink PLENTY of fluids Typically, we are the most contagious 1-2 days before symptoms start through the first 2-3 days of most severe symptoms. Per CDC guidelines, you can return to school/work when symptoms have started to improve and you have been fever-free for 24 hours. However, recommend you continue extra precautions for the following 5 days (frequent hand hygiene, masking, covering coughs/sneezes, minimize exposure to immunocompromised individuals, etc).

## 2023-06-22 ENCOUNTER — Encounter: Payer: Self-pay | Admitting: Family Medicine

## 2023-06-22 ENCOUNTER — Ambulatory Visit: Payer: Medicare PPO | Admitting: Family Medicine

## 2023-06-22 DIAGNOSIS — M810 Age-related osteoporosis without current pathological fracture: Secondary | ICD-10-CM | POA: Diagnosis not present

## 2023-06-22 MED ORDER — DENOSUMAB 60 MG/ML ~~LOC~~ SOSY: 60.0000 mg | PREFILLED_SYRINGE | Freq: Once | SUBCUTANEOUS | Status: DC

## 2023-06-22 MED ORDER — DENOSUMAB 60 MG/ML ~~LOC~~ SOSY
60.0000 mg | PREFILLED_SYRINGE | Freq: Once | SUBCUTANEOUS | Status: AC
  Administered 2023-06-22: 60 mg via SUBCUTANEOUS

## 2023-06-22 NOTE — Progress Notes (Deleted)
 cancelled

## 2023-06-22 NOTE — Progress Notes (Signed)
 Patient is here for nurse visit for Prolia  60 mg [place] inj in left arm. Patient tolerated injection well. She will return for her next injection in 6 months. She will call us  if she develops problems with the injection.

## 2023-06-25 ENCOUNTER — Ambulatory Visit: Payer: Medicare PPO

## 2023-06-25 ENCOUNTER — Ambulatory Visit: Payer: Medicare PPO | Admitting: Family Medicine

## 2023-06-25 ENCOUNTER — Encounter: Payer: Self-pay | Admitting: Family Medicine

## 2023-06-25 VITALS — BP 118/78 | Ht 63.75 in | Wt 166.0 lb

## 2023-06-25 DIAGNOSIS — G8929 Other chronic pain: Secondary | ICD-10-CM | POA: Diagnosis not present

## 2023-06-25 DIAGNOSIS — M545 Low back pain, unspecified: Secondary | ICD-10-CM

## 2023-06-25 NOTE — Progress Notes (Signed)
CHIEF COMPLAINT: No chief complaint on file.  _____________________________________________________________ SUBJECTIVE  HPI  Pt is a 78 y.o. female here for evaluation of back pain  Ongoing for 40 years Inciting event: mostly upper back 40 years ago + neck following an accident. About 18-19 years ago fell down some stair, broke foot and ankle in multiple places. 6-7 years ago picking up a bag of mulch and felt her back pop. Had been seeing a chiropractor at that point, and maintained her pain/kept it from worsening. Was given HEP exercises, utilized acupuncture. 3-4 years ago moving home to [place], twisted knee and tore meniscus. A year ago in August had TKA. Feels that multiple msk imbalances contributed to this Has the most back pain when she is standing trying to cook a meal, or standing at a reception. Radiating to her hips 08/23/2021 lumbar XR ordered showing degenerative changes No numbness or tingling Can walk a mile w/o a problem but longer will elicit pain Got a mattress 1 year ago, does not think back pain related to this  ------------------------------------------------------------------------------------------------------ Past Medical History:  Diagnosis Date   Allergy    Anemia    Anxiety    Arthritis    Blood transfusion without reported diagnosis    Cataract    Constipation, chronic    DEGENERATIVE JOINT DISEASE 06/02/2010   Depression    GERD (gastroesophageal reflux disease)    Glaucoma 05/15/2012   Right eye   Headache(784.0)    Hyperlipidemia    hx of   Osteopenia    Osteoporosis    Psoriasis    PUD (peptic ulcer disease) 05/15/2010   Upper GI bleed d/t NSAIDs   RLS (restless legs syndrome)    robaxin prn   TIA (transient ischemic attack) 09/2019   Past Surgical History:  Procedure Laterality Date   ANKLE FRACTURE SURGERY Left    surgery 12-11 (LEFT)   CESAREAN SECTION  05/16/1967   COLONOSCOPY  12/17/2013   EYE SURGERY  2020   cataract  removal   FRACTURE SURGERY  2012   fracture in foot   JOINT REPLACEMENT  August, 2023   Left knee   TOTAL KNEE ARTHROPLASTY Left 12/28/2021   UPPER GASTROINTESTINAL ENDOSCOPY      Outpatient Encounter Medications as of 06/25/2023  Medication Sig Note   aspirin EC 81 MG tablet Take 81 mg by mouth daily. Swallow whole.    benzonatate (TESSALON) 100 MG capsule Take 1 capsule (100 mg total) by mouth 2 (two) times daily as needed for cough.    buPROPion (WELLBUTRIN XL) 300 MG 24 hr tablet Take 1 tablet (300 mg total) by mouth daily.    cetirizine (ZYRTEC) 10 MG tablet Take 10 mg by mouth at bedtime as needed for allergies.    cholecalciferol (VITAMIN D3) 10 MCG (400 UNIT) TABS tablet Take by mouth.    ezetimibe (ZETIA) 10 MG tablet Take 1 tablet (10 mg total) by mouth daily.    fluticasone (FLONASE) 50 MCG/ACT nasal spray Place 2 sprays into both nostrils daily.    gabapentin (NEURONTIN) 100 MG capsule Take 2 capsules (200 mg total) by mouth at bedtime.    ketoconazole (NIZORAL) 2 % cream Apply 1 Application topically daily.    methocarbamol (ROBAXIN) 500 MG tablet TAKE ONE (1) TABLET BY MOUTH 3 TIMES DAILY AS NEEDED FOR MUSCLE SPASMS 07/13/2022: As needed    Multiple Vitamins-Minerals (ICAPS AREDS 2 PO) Take by mouth.    nystatin (MYCOSTATIN) 100000 UNIT/ML suspension Take 5 mLs (500,000  Units total) by mouth 4 (four) times daily.    omeprazole (PRILOSEC) 40 MG capsule Take 1 capsule (40 mg total) by mouth daily.    promethazine-dextromethorphan (PROMETHAZINE-DM) 6.25-15 MG/5ML syrup Take 5 mLs by mouth 4 (four) times daily as needed for cough.    propranolol (INDERAL) 10 MG tablet Take 1 tablet (10 mg total) by mouth 3 (three) times daily. 03/16/2023: BID   sertraline (ZOLOFT) 100 MG tablet Take 2 tablets (200 mg total) by mouth daily.    traMADol (ULTRAM) 50 MG tablet  03/16/2023: As needed   Travoprost, BAK Free, (TRAVATAN) 0.004 % SOLN ophthalmic solution Place 1 drop into the right eye at  bedtime.    zolpidem (AMBIEN) 5 MG tablet Take 1 tablet (5 mg total) by mouth at bedtime as needed for sleep.    No facility-administered encounter medications on file as of 06/25/2023.    ------------------------------------------------------------------------------------------------------  _____________________________________________________________ OBJECTIVE  PHYSICAL EXAM  Today's Vitals   06/25/23 1058 06/25/23 1152  BP: (!) 144/90 118/78  Weight: 166 lb (75.3 kg)   Height: 5' 3.75" (1.619 m)    Body mass index is 28.72 kg/m.   reviewed  General: A+Ox3, no acute distress, well-nourished, appropriate affect CV: pulses 2+ regular, nondiaphoretic, no peripheral edema, cap refill <2sec Lungs: no audible wheezing, non-labored breathing, bilateral chest rise/fall, nontachypneic Skin: warm, well-perfused, non-icteric, no susp lesions or rashes Neuro:  Sensation intact, muscle tone wnl, no atrophy Psych: no signs of depression or anxiety MSK:   No lumbar spine tenderness, palpable b/l lumbar paraspinal tonicity TTP right SIJ, lateral iliac crest, greater trochanter (present bilaterally) For lumbar flexion 80, extension 15, negative stork SI joint asymmetry present, lower hide right SIJ with concurrent elevated ASIS landmark s/f anterior innominate/somatic dysfunction + Trendelenburg r>l + Right back pain with Stinchfield, left testing elicits discomfort over iliopsoas Lateral hip pain with resisted right hip abduction Negative logroll, scour, FABER/FADIR, no pain with resisted adduction Tender IT band, tight hamstrings _____________________________________________________________ ASSESSMENT/PLAN Diagnoses and all orders for this visit:  Chronic bilateral low back pain without sciatica -     Ambulatory referral to Physical Therapy   Multifactorial causes of lower back pain, has also been evaluated for chronic neck pain in setting of known scoliosis (dextroscoliosis  present on films), history of foot and ankle trauma/fracture, history of torn meniscus resulting in mechanical dysfunction and progressive deconditioning/compensatory overuse.  Options discussed for management, overall patient is interested in utilizing physical therapy to start to help with recovery/reconditioning.  Physical therapy for [place] written, prescription also printed for her to present to the physical therapist she is hoping to work with.  OTC therapies reviewed. All questions answered. Return precautions discussed. Patient verbalized understanding and is in agreement with plan. Anticipate f/u 4-6 weeks PRN, sooner as needed for refractory, worsening, or new concerns  Electronically signed by: Burna Forts, MD 06/25/2023 7:39 AM

## 2023-07-10 ENCOUNTER — Ambulatory Visit: Payer: Medicare PPO | Admitting: Sports Medicine

## 2023-07-16 DIAGNOSIS — M25562 Pain in left knee: Secondary | ICD-10-CM | POA: Diagnosis not present

## 2023-07-17 NOTE — Telephone Encounter (Signed)
 Last Prolia inj 06/22/23 Next Prolia inj due 12/21/23

## 2023-07-21 ENCOUNTER — Other Ambulatory Visit: Payer: Self-pay | Admitting: Internal Medicine

## 2023-07-21 ENCOUNTER — Encounter: Payer: Self-pay | Admitting: Neurology

## 2023-07-23 ENCOUNTER — Telehealth: Payer: Self-pay

## 2023-07-23 MED ORDER — GABAPENTIN 100 MG PO CAPS: ORAL_CAPSULE | ORAL | 5 refills | Status: DC

## 2023-07-23 NOTE — Telephone Encounter (Signed)
 I would titrate up on the gabapentin.  Currently taking 200mg  at bedtime.  Send in new prescription for 100mg  capsules:  Have her take ONE CAPSULE IN MORNING AND 2 CAPSULES AT BEDTIME FOR ONE WEEK, THEN TWO CAPSULES IN MORNING AND TWO CAPSULES AT BEDTIME FOR ONE WEEK, THEN THREE CAPSULES TWICE DAILY.

## 2023-07-24 ENCOUNTER — Ambulatory Visit (INDEPENDENT_AMBULATORY_CARE_PROVIDER_SITE_OTHER): Payer: Medicare PPO | Admitting: Internal Medicine

## 2023-07-24 ENCOUNTER — Encounter: Payer: Self-pay | Admitting: Internal Medicine

## 2023-07-24 VITALS — BP 122/82 | HR 77 | Temp 98.3°F | Resp 16 | Ht 63.75 in | Wt 173.2 lb

## 2023-07-24 DIAGNOSIS — F32A Depression, unspecified: Secondary | ICD-10-CM

## 2023-07-24 DIAGNOSIS — Z0001 Encounter for general adult medical examination with abnormal findings: Secondary | ICD-10-CM

## 2023-07-24 DIAGNOSIS — D649 Anemia, unspecified: Secondary | ICD-10-CM

## 2023-07-24 DIAGNOSIS — E785 Hyperlipidemia, unspecified: Secondary | ICD-10-CM | POA: Diagnosis not present

## 2023-07-24 DIAGNOSIS — Z0000 Encounter for general adult medical examination without abnormal findings: Secondary | ICD-10-CM

## 2023-07-24 DIAGNOSIS — F419 Anxiety disorder, unspecified: Secondary | ICD-10-CM

## 2023-07-24 DIAGNOSIS — G43809 Other migraine, not intractable, without status migrainosus: Secondary | ICD-10-CM

## 2023-07-24 LAB — LIPID PANEL
Cholesterol: 270 mg/dL — ABNORMAL HIGH (ref 0–200)
HDL: 61.5 mg/dL (ref >39.00)
LDL Cholesterol: 171 mg/dL — ABNORMAL HIGH (ref 0–99)
NonHDL: 208.92
Total CHOL/HDL Ratio: 4
Triglycerides: 188 mg/dL — ABNORMAL HIGH (ref 0.0–149.0)
VLDL: 37.6 mg/dL (ref 0.0–40.0)

## 2023-07-24 LAB — COMPREHENSIVE METABOLIC PANEL
ALT: 18 U/L (ref 0–35)
AST: 22 U/L (ref 0–37)
Albumin: 4.3 g/dL (ref 3.5–5.2)
Alkaline Phosphatase: 127 U/L — ABNORMAL HIGH (ref 39–117)
BUN: 14 mg/dL (ref 6–23)
CO2: 29 meq/L (ref 19–32)
Calcium: 9.5 mg/dL (ref 8.4–10.5)
Chloride: 98 meq/L (ref 96–112)
Creatinine, Ser: 0.75 mg/dL (ref 0.40–1.20)
GFR: 75.9 mL/min (ref >60.00)
Glucose, Bld: 85 mg/dL (ref 70–99)
Potassium: 4.9 meq/L (ref 3.5–5.1)
Sodium: 135 meq/L (ref 135–145)
Total Bilirubin: 0.4 mg/dL (ref 0.2–1.2)
Total Protein: 6.6 g/dL (ref 6.0–8.3)

## 2023-07-24 LAB — CBC WITH DIFFERENTIAL/PLATELET
Basophils Absolute: 0.1 10*3/uL (ref 0.0–0.1)
Basophils Relative: 0.9 % (ref 0.0–3.0)
Eosinophils Absolute: 0.8 10*3/uL — ABNORMAL HIGH (ref 0.0–0.7)
Eosinophils Relative: 8.7 % — ABNORMAL HIGH (ref 0.0–5.0)
HCT: 37 % (ref 36.0–46.0)
Hemoglobin: 12.2 g/dL (ref 12.0–15.0)
Lymphocytes Relative: 20.4 % (ref 12.0–46.0)
Lymphs Abs: 1.8 10*3/uL (ref 0.7–4.0)
MCHC: 33 g/dL (ref 30.0–36.0)
MCV: 89.6 fl (ref 78.0–100.0)
Monocytes Absolute: 0.9 10*3/uL (ref 0.1–1.0)
Monocytes Relative: 10.9 % (ref 3.0–12.0)
Neutro Abs: 5.1 10*3/uL (ref 1.4–7.7)
Neutrophils Relative %: 59.1 % (ref 43.0–77.0)
Platelets: 400 10*3/uL (ref 150.0–400.0)
RBC: 4.13 Mil/uL (ref 3.87–5.11)
RDW: 14.7 % (ref 11.5–15.5)
WBC: 8.6 10*3/uL (ref 4.0–10.5)

## 2023-07-24 MED ORDER — TRAMADOL HCL 50 MG PO TABS: 50.0000 mg | ORAL_TABLET | Freq: Every day | ORAL | 0 refills | Status: AC | PRN

## 2023-07-24 NOTE — Patient Instructions (Addendum)
 Vaccines I recommend: Tdap (tetanus) Covid booster   Start using a cane consistently  GO TO THE LAB : Get the blood work     Please go to the front desk: Arrange for a follow-up in 6 months       "Health Care Power of attorney" (Also know as a  "Living will" or  Advance care planning documents)  If you already have a living will or healthcare power of attorney, is recommended you bring the copy to be scanned in your chart.   The document will be available to all the doctors you see in the system.  If you are over 2 y/o and don't have the document, please read:  Advance care planning is a process that supports adults in  understanding and sharing their preferences regarding future medical care.  The patient's preferences are recorded in documents called Advance Directives and the can be modified at any time while the patient is in full mental capacity.     More information at: StageSync.si   Fall Prevention in the Home, Adult Falls can cause injuries and affect people of all ages. There are many simple things that you can do to make your home safe and to help prevent falls. If you need it, ask for help making these changes. What actions can I take to prevent falls? General information Use good lighting in all rooms. Make sure to: Replace any light bulbs that burn out. Turn on lights if it is dark and use night-lights. Keep items that you use often in easy-to-reach places. Lower the shelves around your home if needed. Move furniture so that there are clear paths around it. Do not keep throw rugs or other things on the floor that can make you trip. If any of your floors are uneven, fix them. Add color or contrast paint or tape to clearly mark and help you see: Grab bars or handrails. First and last steps of staircases. Where the edge of each step is. If you use a ladder or stepladder: Make sure that it is fully opened. Do not climb a closed  ladder. Make sure the sides of the ladder are locked in place. Have someone hold the ladder while you use it. Know where your pets are as you move through your home. What can I do in the bathroom?     Keep the floor dry. Clean up any water that is on the floor right away. Remove soap buildup in the bathtub or shower. Buildup makes bathtubs and showers slippery. Use non-skid mats or decals on the floor of the bathtub or shower. Attach bath mats securely with double-sided, non-slip rug tape. If you need to sit down while you are in the shower, use a non-slip stool. Install grab bars by the toilet and in the bathtub and shower. Do not use towel bars as grab bars. What can I do in the bedroom? Make sure that you have a light by your bed that is easy to reach. Do not use any sheets or blankets on your bed that hang to the floor. Have a firm bench or chair with side arms that you can use for support when you get dressed. What can I do in the kitchen? Clean up any spills right away. If you need to reach something above you, use a sturdy step stool that has a grab bar. Keep electrical cables out of the way. Do not use floor polish or wax that makes floors slippery. What can I  do with my stairs? Do not leave anything on the stairs. Make sure that you have a light switch at the top and the bottom of the stairs. Have them installed if you do not have them. Make sure that there are handrails on both sides of the stairs. Fix handrails that are broken or loose. Make sure that handrails are as long as the staircases. Install non-slip stair treads on all stairs in your home if they do not have carpet. Avoid having throw rugs at the top or bottom of stairs, or secure the rugs with carpet tape to prevent them from moving. Choose a carpet design that does not hide the edge of steps on the stairs. Make sure that carpet is firmly attached to the stairs. Fix any carpet that is loose or worn. What can I do on  the outside of my home? Use bright outdoor lighting. Repair the edges of walkways and driveways and fix any cracks. Clear paths of anything that can make you trip, such as tools or rocks. Add color or contrast paint or tape to clearly mark and help you see high doorway thresholds. Trim any bushes or trees on the main path into your home. Check that handrails are securely fastened and in good repair. Both sides of all steps should have handrails. Install guardrails along the edges of any raised decks or porches. Have leaves, snow, and ice cleared regularly. Use sand, salt, or ice melt on walkways during winter months if you live where there is ice and snow. In the garage, clean up any spills right away, including grease or oil spills. What other actions can I take? Review your medicines with your health care provider. Some medicines can make you confused or feel dizzy. This can increase your chance of falling. Wear closed-toe shoes that fit well and support your feet. Wear shoes that have rubber soles and low heels. Use a cane, walker, scooter, or crutches that help you move around if needed. Talk with your provider about other ways that you can decrease your risk of falls. This may include seeing a physical therapist to learn to do exercises to improve movement and strength. Where to find more information Centers for Disease Control and Prevention, STEADI: TonerPromos.no [employer] on Aging: BaseRingTones.pl National Institute on Aging: BaseRingTones.pl Contact a health care provider if: You are afraid of falling at home. You feel weak, drowsy, or dizzy at home. You fall at home. Get help right away if you: Lose consciousness or have trouble moving after a fall. Have a fall that causes a head injury. These symptoms may be an emergency. Get help right away. Call 911. Do not wait to see if the symptoms will go away. Do not drive yourself to the hospital. This information is not intended to replace  advice given to you by your health care provider. Make sure you discuss any questions you have with your health care provider. Document Revised: 01/02/2022 Document Reviewed: 01/02/2022 Elsevier Patient Education  2024 [employer].

## 2023-07-24 NOTE — Assessment & Plan Note (Signed)
 Here for CPX  We also address the following issues Hyperlipidemia: Declines statins, on Zetia.  Check  labs. Anxiety, depression, insomnia: Doing really well emotionally, on Wellbutrin, sertraline, Ambien sporadically. Fall prevention: See comments under physical exam.  Recently sports medicine referred her physical therapy which she is doing. Migraines:  At some point ran out of gabapentin and realize how much this medication was helping with headaches.  Neurology sent a prescription for it.  Request tramadol refill, PDMP okay, prescription sent.  (Take it very sporadically). Osteoporosis: Per sports medicine.  On Prolia. RTC 6 months

## 2023-07-24 NOTE — Progress Notes (Signed)
 Subjective:    Patient ID: Cynthia [name], female    DOB: 26-Apr-1946, 78 y.o.   MRN: [id number]  DOS:  07/24/2023 Type of visit - description: CPX  Here for CPX Chronic medical problems addressed. In general feels well, did report 4 falls over the last year.  The only injury she had was the left knee, subsequently saw orthopedics and she is now better. All falls were mechanical.   Review of Systems  Other than above, a 14 point review of systems is negative      Past Medical History:  Diagnosis Date   Allergy    Anemia    Anxiety    Arthritis    Blood transfusion without reported diagnosis    Cataract    Constipation, chronic    DEGENERATIVE JOINT DISEASE 06/02/2010   Depression    GERD (gastroesophageal reflux disease)    Glaucoma 05/15/2012   Right eye   Headache(784.0)    Hyperlipidemia    hx of   Osteopenia    Osteoporosis    Psoriasis    PUD (peptic ulcer disease) 05/15/2010   Upper GI bleed d/t NSAIDs   RLS (restless legs syndrome)    robaxin prn   TIA (transient ischemic attack) 09/2019    Past Surgical History:  Procedure Laterality Date   ANKLE FRACTURE SURGERY Left    surgery 12-11 (LEFT)   CESAREAN SECTION  05/16/1967   COLONOSCOPY  12/17/2013   EYE SURGERY  2020   cataract removal   FRACTURE SURGERY  2012   fracture in foot   JOINT REPLACEMENT  August, 2023   Left knee   TOTAL KNEE ARTHROPLASTY Left 12/28/2021   UPPER GASTROINTESTINAL ENDOSCOPY     Social History   Socioeconomic History   Marital status: Widowed    Spouse name: Not on file   Number of children: 1   Years of education: Not on file   Highest education level: Doctorate  Occupational History   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  Tobacco Use   Smoking status: Never    Passive exposure: Never   Smokeless tobacco: Never  Vaping Use   Vaping status: Never Used  Substance and Sexual Activity   Alcohol use: Yes    Comment: socially   Drug  use: No   Sexual activity: Not Currently  Other Topics Concern   Not on file  Social History Narrative   2 step children, 1 child , 7 GK   Lost husband, moved [place] 02-13-2020 ; apartment, 3th floor w/ elevator      Pt is right handed   Social Drivers of Corporate investment banker Strain: Low Risk  (05/02/2023)   Overall Financial Resource Strain (CARDIA)    Difficulty of Paying Living Expenses: Not very hard  Food Insecurity: No Food Insecurity (05/02/2023)   Hunger Vital Sign    Worried About Running Out of Food in the Last Year: Never true    Ran Out of Food in the Last Year: Never true  Transportation Needs: No Transportation Needs (05/02/2023)   PRAPARE - Administrator, Civil Service (Medical): No    Lack of Transportation (Non-Medical): No  Physical Activity: Insufficiently Active (05/02/2023)   Exercise Vital Sign    Days of Exercise per Week: 2 days    Minutes of Exercise per Session: 60 min  Stress: Stress Concern Present (05/02/2023)   [employer] of Occupational Health - Occupational  Stress Questionnaire    Feeling of Stress : To some extent  Social Connections: Moderately Integrated (05/02/2023)   Social Connection and Isolation Panel [NHANES]    Frequency of Communication with Friends and Family: Once a week    Frequency of Social Gatherings with Friends and Family: More than three times a week    Attends Religious Services: More than 4 times per year    Active Member of [employer] or Organizations: Yes    Attends Banker Meetings: More than 4 times per year    Marital Status: Widowed  Intimate Partner Violence: Not At Risk (07/31/2022)   Humiliation, Afraid, Rape, and Kick questionnaire    Fear of Current or Ex-Partner: No    Emotionally Abused: No    Physically Abused: No    Sexually Abused: No    Current Outpatient Medications  Medication Instructions   aspirin EC 81 mg, Daily   buPROPion (WELLBUTRIN XL) 300 mg, Oral,  Daily   cetirizine (ZYRTEC) 10 mg, At bedtime PRN   cholecalciferol (VITAMIN D3) 10 MCG (400 UNIT) TABS tablet Take by mouth.   ezetimibe (ZETIA) 10 mg, Oral, Daily   fluticasone (FLONASE) 50 MCG/ACT nasal spray 2 sprays, Each Nare, Daily   gabapentin (NEURONTIN) 100 MG capsule take ONE CAPSULE IN MORNING AND 2 CAPSULES AT BEDTIME FOR ONE WEEK, THEN TWO CAPSULES IN MORNING AND TWO CAPSULES AT BEDTIME FOR ONE WEEK, THEN THREE CAPSULES TWICE DAILY.   ketoconazole (NIZORAL) 2 % cream 1 Application, Topical, Daily   methocarbamol (ROBAXIN) 500 MG tablet TAKE ONE (1) TABLET BY MOUTH 3 TIMES DAILY AS NEEDED FOR MUSCLE SPASMS   Multiple Vitamins-Minerals (ICAPS AREDS 2 PO) Take by mouth.   nystatin (MYCOSTATIN) 500,000 Units, Oral, 4 times daily   omeprazole (PRILOSEC) 40 mg, Oral, Daily   promethazine-dextromethorphan (PROMETHAZINE-DM) 6.25-15 MG/5ML syrup 5 mLs, Oral, 4 times daily PRN   propranolol (INDERAL) 10 mg, Oral, 3 times daily   sertraline (ZOLOFT) 200 mg, Oral, Daily   traMADol (ULTRAM) 50 mg, Oral, Daily PRN   Travoprost, BAK Free, (TRAVATAN) 0.004 % SOLN ophthalmic solution 1 drop, Daily at bedtime   zolpidem (AMBIEN) 5 mg, Oral, At bedtime PRN       Objective:   Physical Exam BP 122/82   Pulse 77   Temp 98.3 F (36.8 C) (Oral)   Resp 16   Ht 5' 3.75" (1.619 m)   Wt 173 lb 4 oz (78.6 kg)   SpO2 97%   BMI 29.97 kg/m  General: Well developed, NAD, BMI noted Neck: No  thyromegaly  HEENT:  Normocephalic . Face symmetric, atraumatic Lungs:  CTA B Normal respiratory effort, no intercostal retractions, no accessory muscle use. Heart: RRR,  no murmur.  Abdomen:  Not distended, soft, non-tender. No rebound or rigidity.   Lower extremities: no pretibial edema bilaterally  Skin: Exposed areas without rash. Not pale. Not jaundice Neurologic:  alert & oriented X3.  Speech normal, gait appropriate for age and unassisted.  Transfer is slightly difficult from lower extremity  DJD. Strength symmetric and appropriate for age.  Psych: Cognition and judgment appear intact.  Cooperative with normal attention span and concentration.  Behavior appropriate. No anxious or depressed appearing.     Assessment   Assessment Hyperlipidemia: Lipitor causes myalgias, declined other statins Anxiety depression insomnia DJD Neuro: ---Migraines, chronic: Saw neurology 12/01/2020: Failed verapamil, nortriptyline and topiramate. Emgality helping.  Gabapentin helps.  Tramadol prn ---TIA: 2021 Admitted to the hospital with left-sided numbness, symptoms  of resolved, work-up included: 2D echo normal with grade 1 diastolic dysfunction.  No PFO or thrombus. MRA head no acute, 2 mm anterior communicating artery aneurysm. MRA neck normal LDL 162.  Sodium 133, slightly low.    Neurology notes: TIA versus complicated migraine, recommended aspirin and Plavix for 3 weeks, then aspirin. --- Tremors   Osteoporosis: 07-2014 T score of -1.7, T score (November 2021) -2.8: sports medicine Rx Prolia PUD due to NSAIDs RLS - robaxin  prn Neuropathy: dx sports meds  Glaucoma Chronic constipation Skin psoriasis , Dx LSC (lichen )  ~ 2014 , sees derm   PLAN: Here for CPX - Tdap 2014.   - PNM 23 2011, 04/26/2020;-prevnar: 2016 - s/p  zostavax , shingrix. RSV -Had a a flu shot -Vaccines I recommend: Tdap, COVID booster if not done in the last 6 months -CCS: Colonoscopy: 05/15/2002, cscope 8-15, 1 polyp.  C-scope 07/11/2021, next per GI. --No further  PAPs  - last MMG 08/07/2022, plans to continue, states she will arrange  -Fall prevention discussed, already doing physical therapy, recommend to use a cane.  Also information provided see AVS -Labs:CMP CBC FLP -Healthcare POA: See AVS We also address the following issues Hyperlipidemia: Declines statins, on Zetia.  Check  labs. Anxiety, depression, insomnia: Doing really well emotionally, on Wellbutrin, sertraline, Ambien sporadically. Fall  prevention: See comments under physical exam.  Recently sports medicine referred her physical therapy which she is doing. Migraines:  At some point ran out of gabapentin and realize how much this medication was helping with headaches.  Neurology sent a prescription for it.  Request tramadol refill, PDMP okay, prescription sent.  (Take it very sporadically). Osteoporosis: Per sports medicine.  On Prolia. RTC 6 months

## 2023-07-26 ENCOUNTER — Encounter: Payer: Self-pay | Admitting: Internal Medicine

## 2023-08-14 ENCOUNTER — Ambulatory Visit (INDEPENDENT_AMBULATORY_CARE_PROVIDER_SITE_OTHER): Payer: Medicare PPO

## 2023-08-14 VITALS — Ht 63.75 in | Wt 171.0 lb

## 2023-08-14 DIAGNOSIS — Z0000 Encounter for general adult medical examination without abnormal findings: Secondary | ICD-10-CM

## 2023-08-14 NOTE — Patient Instructions (Addendum)
 Ms. Cynthia [name] , Thank you for taking time to come for your Medicare Wellness Visit. I appreciate your ongoing commitment to your health goals. Please review the following plan we discussed and let me know if I can assist you in the future.   Referrals/Orders/Follow-Ups/Clinician Recommendations:   This is a list of the screening recommended for you and due dates:  Health Maintenance  Topic Date Due   DTaP/Tdap/Td vaccine (4 - Td or Tdap) 02/28/2023   COVID-19 Vaccine (8 - 2024-25 season) 01/23/2024*   Flu Shot  12/14/2023   Medicare Annual Wellness Visit  08/13/2024   Colon Cancer Screening  07/11/2028   Pneumonia Vaccine  Completed   DEXA scan (bone density measurement)  Completed   Hepatitis C Screening  Completed   Zoster (Shingles) Vaccine  Completed   HPV Vaccine  Aged Out  *Topic was postponed. The date shown is not the original due date.   Opioid Pain Medicine Management Opioids are powerful medicines that are used to treat moderate to severe pain. When used for short periods of time, they can help you to: Sleep better. Do better in physical or occupational therapy. Feel better in the first few days after an injury. Recover from surgery. Opioids should be taken with the supervision of a trained health care provider. They should be taken for the shortest period of time possible. This is because opioids can be addictive, and the longer you take opioids, the greater your risk of addiction. This addiction can also be called opioid use disorder. What are the risks? Using opioid pain medicines for longer than 3 days increases your risk of side effects. Side effects include: Constipation. Nausea and vomiting. Breathing difficulties (respiratory depression). Drowsiness. Confusion. Opioid use disorder. Itching. Taking opioid pain medicine for a long period of time can affect your ability to do daily tasks. It also puts you at risk for: Motor vehicle  crashes. Depression. Suicide. Heart attack. Overdose, which can be life-threatening. What is a pain treatment plan? A pain treatment plan is an agreement between you and your health care provider. Pain is unique to each person, and treatments vary depending on your condition. To manage your pain, you and your health care provider need to work together. To help you do this: Discuss the goals of your treatment, including how much pain you might expect to have and how you will manage the pain. Review the risks and benefits of taking opioid medicines. Remember that a good treatment plan uses more than one approach and minimizes the chance of side effects. Be honest about the amount of medicines you take and about any drug or alcohol use. Get pain medicine prescriptions from only one health care provider. Pain can be managed with many types of alternative treatments. Ask your health care provider to refer you to one or more specialists who can help you manage pain through: Physical or occupational therapy. Counseling (cognitive behavioral therapy). Good nutrition. Biofeedback. Massage. Meditation. Non-opioid medicine. Following a gentle exercise program. How to use opioid pain medicine Taking medicine Take your pain medicine exactly as told by your health care provider. Take it only when you need it. If your pain gets less severe, you may take less than your prescribed dose if your health care provider approves. If you are not having pain, do nottake pain medicine unless your health care provider tells you to take it. If your pain is severe, do nottry to treat it yourself by taking more pills than instructed on your  prescription. Contact your health care provider for help. Write down the times when you take your pain medicine. It is easy to become confused while on pain medicine. Writing the time can help you avoid overdose. Take other over-the-counter or prescription medicines only as told by  your health care provider. Keeping yourself and others safe  While you are taking opioid pain medicine: Do not drive, use machinery, or power tools. Do not sign legal documents. Do not drink alcohol. Do not take sleeping pills. Do not supervise children by yourself. Do not do activities that require climbing or being in high places. Do not go to a lake, river, ocean, spa, or swimming pool. Do not share your pain medicine with anyone. Keep pain medicine in a locked cabinet or in a secure area where pets and children cannot reach it. Stopping your use of opioids If you have been taking opioid medicine for more than a few weeks, you may need to slowly decrease (taper) how much you take until you stop completely. Tapering your use of opioids can decrease your risk of symptoms of withdrawal, such as: Pain and cramping in the abdomen. Nausea. Sweating. Sleepiness. Restlessness. Uncontrollable shaking (tremors). Cravings for the medicine. Do not attempt to taper your use of opioids on your own. Talk with your health care provider about how to do this. Your health care provider may prescribe a step-down schedule based on how much medicine you are taking and how long you have been taking it. Getting rid of leftover pills Do not save any leftover pills. Get rid of leftover pills safely by: Taking the medicine to a prescription take-back program. This is usually offered by the county or law enforcement. Bringing them to a pharmacy that has a drug disposal container. Flushing them down the toilet. Check the label or package insert of your medicine to see whether this is safe to do. Throwing them out in the trash. Check the label or package insert of your medicine to see whether this is safe to do. If it is safe to throw it out, remove the medicine from the original container, put it into a sealable bag or container, and mix it with used coffee grounds, food scraps, dirt, or cat litter before putting  it in the trash. Follow these instructions at home: Activity Do exercises as told by your health care provider. Avoid activities that make your pain worse. Return to your normal activities as told by your health care provider. Ask your health care provider what activities are safe for you. General instructions You may need to take these actions to prevent or treat constipation: Drink enough fluid to keep your urine pale yellow. Take over-the-counter or prescription medicines. Eat foods that are high in fiber, such as beans, whole grains, and fresh fruits and vegetables. Limit foods that are high in fat and processed sugars, such as fried or sweet foods. Keep all follow-up visits. This is important. Where to find support If you have been taking opioids for a long time, you may benefit from receiving support for quitting from a local support group or counselor. Ask your health care provider for a referral to these resources in your area. Where to find more information Centers for Disease Control and Prevention (CDC): FootballExhibition.com.br [place] Food and Drug Administration (FDA): PumpkinSearch.com.ee Get help right away if: You may have taken too much of an opioid (overdosed). Common symptoms of an overdose: Your breathing is slower or more shallow than normal. You have a very  slow heartbeat (pulse). You have slurred speech. You have nausea and vomiting. Your pupils become very small. You have other potential symptoms: You are very confused. You faint or feel like you will faint. You have cold, clammy skin. You have blue lips or fingernails. You have thoughts of harming yourself or harming others. These symptoms may represent a serious problem that is an emergency. Do not wait to see if the symptoms will go away. Get medical help right away. Call your local emergency services (911 in the [place] Do not drive yourself to the hospital.  If you ever feel like you may hurt yourself or others, or have thoughts  about taking your own life, get help right away. Go to your nearest emergency department or: Call your local emergency services (911 in the [place] Call the [facility] (256-019-2243 in the [place] Call a suicide crisis helpline, such as the National Suicide Prevention Lifeline at 289-202-4637 or 988 in the [place] This is open 24 hours a day in the [place] If you're a Veteran: Call 988 and press 1. This is open 24 hours a day. Text the [employer] at (813)350-6143. Summary Opioid medicines can help you manage moderate to severe pain for a short period of time. A pain treatment plan is an agreement between you and your health care provider. Discuss the goals of your treatment, including how much pain you might expect to have and how you will manage the pain. If you think that you or someone else may have taken too much of an opioid, get medical help right away. This information is not intended to replace advice given to you by your health care provider. Make sure you discuss any questions you have with your health care provider. Document Revised: 02/05/2023 Document Reviewed: 08/11/2020 Elsevier Patient Education  2024 [employer]. Advanced directives: (Copy Requested) Please bring a copy of your health care power of attorney and living will to the office to be added to your chart at your convenience. You can mail to [facility] [address] [place], [place] [address] or email to [email or web address]  Next Medicare Annual Wellness Visit scheduled for next year: Yes

## 2023-08-14 NOTE — Progress Notes (Signed)
 Subjective:   Cynthia [name] is a 78 y.o. who presents for a Medicare Wellness preventive visit.  Visit Complete: Virtual I connected with  Weyman Pedro on 08/14/23 by a audio enabled telemedicine application and verified that I am speaking with the correct person using two identifiers.  Patient Location: Home  Provider Location: Home Office  I discussed the limitations of evaluation and management by telemedicine. The patient expressed understanding and agreed to proceed.  Vital Signs: Because this visit was a virtual/telehealth visit, some criteria may be missing or patient reported. Any vitals not documented were not able to be obtained and vitals that have been documented are patient reported.    Persons Participating in Visit: Patient.  AWV Questionnaire: No: Patient Medicare AWV questionnaire was not completed prior to this visit.  Cardiac Risk Factors include: advanced age (>52men, >36 women)     Objective:    Today's Vitals   08/14/23 1435  Weight: 171 lb (77.6 kg)  Height: 5' 3.75" (1.619 m)   Body mass index is 29.58 kg/m.     08/14/2023    2:49 PM 08/14/2023    2:47 PM 03/16/2023   10:09 AM 07/31/2022    9:19 AM 06/16/2022    1:16 PM 12/14/2021   10:13 AM 05/17/2021    1:41 PM  Advanced Directives  Does Patient Have a Medical Advance Directive? Yes Yes Yes Yes Yes Yes Yes  Type of Estate agent of [place];Living will Healthcare Power of [place];Living will Healthcare Power of [place];Living will Healthcare Power of [place];Living will  Healthcare Power of [place];Living will Healthcare Power of [place];Living will  Does patient want to make changes to medical advance directive?    No - Patient declined     Copy of Healthcare Power of Attorney in Chart? No - copy requested No - copy requested  No - copy requested   No - copy requested    Current Medications (verified) Outpatient Encounter Medications as of 08/14/2023  Medication Sig    aspirin EC 81 MG tablet Take 81 mg by mouth daily. Swallow whole.   buPROPion (WELLBUTRIN XL) 300 MG 24 hr tablet Take 1 tablet (300 mg total) by mouth daily.   cetirizine (ZYRTEC) 10 MG tablet Take 10 mg by mouth at bedtime as needed for allergies.   cholecalciferol (VITAMIN D3) 10 MCG (400 UNIT) TABS tablet Take by mouth.   ezetimibe (ZETIA) 10 MG tablet Take 1 tablet (10 mg total) by mouth daily.   fluticasone (FLONASE) 50 MCG/ACT nasal spray Place 2 sprays into both nostrils daily.   gabapentin (NEURONTIN) 100 MG capsule take ONE CAPSULE IN MORNING AND 2 CAPSULES AT BEDTIME FOR ONE WEEK, THEN TWO CAPSULES IN MORNING AND TWO CAPSULES AT BEDTIME FOR ONE WEEK, THEN THREE CAPSULES TWICE DAILY.   ketoconazole (NIZORAL) 2 % cream Apply 1 Application topically daily.   methocarbamol (ROBAXIN) 500 MG tablet TAKE ONE (1) TABLET BY MOUTH 3 TIMES DAILY AS NEEDED FOR MUSCLE SPASMS   Multiple Vitamins-Minerals (ICAPS AREDS 2 PO) Take by mouth.   nystatin (MYCOSTATIN) 100000 UNIT/ML suspension Take 5 mLs (500,000 Units total) by mouth 4 (four) times daily.   omeprazole (PRILOSEC) 40 MG capsule Take 1 capsule (40 mg total) by mouth daily.   promethazine-dextromethorphan (PROMETHAZINE-DM) 6.25-15 MG/5ML syrup Take 5 mLs by mouth 4 (four) times daily as needed for cough. (Patient not taking: Reported on 07/24/2023)   propranolol (INDERAL) 10 MG tablet Take 1 tablet (10 mg total) by  mouth 3 (three) times daily.   sertraline (ZOLOFT) 100 MG tablet Take 2 tablets (200 mg total) by mouth daily.   traMADol (ULTRAM) 50 MG tablet Take 1 tablet (50 mg total) by mouth daily as needed.   Travoprost, BAK Free, (TRAVATAN) 0.004 % SOLN ophthalmic solution Place 1 drop into the right eye at bedtime.   zolpidem (AMBIEN) 5 MG tablet Take 1 tablet (5 mg total) by mouth at bedtime as needed for sleep.   No facility-administered encounter medications on file as of 08/14/2023.    Allergies (verified) Bee pollen, Motrin  [ibuprofen], Pollen extract, Statins, Sulfonamide derivatives, and Ciprofloxacin   History: Past Medical History:  Diagnosis Date   Allergy    Anemia    Anxiety    Arthritis    Blood transfusion without reported diagnosis    Cataract    Constipation, chronic    DEGENERATIVE JOINT DISEASE 06/02/2010   Depression    GERD (gastroesophageal reflux disease)    Glaucoma 05/15/2012   Right eye   Headache(784.0)    Hyperlipidemia    hx of   Osteopenia    Osteoporosis    Psoriasis    PUD (peptic ulcer disease) 05/15/2010   Upper GI bleed d/t NSAIDs   RLS (restless legs syndrome)    robaxin prn   TIA (transient ischemic attack) 09/2019   Past Surgical History:  Procedure Laterality Date   ANKLE FRACTURE SURGERY Left    surgery 12-11 (LEFT)   CESAREAN SECTION  05/16/1967   COLONOSCOPY  12/17/2013   EYE SURGERY  2020   cataract removal   FRACTURE SURGERY  2012   fracture in foot   JOINT REPLACEMENT  August, 2023   Left knee   TOTAL KNEE ARTHROPLASTY Left 12/28/2021   UPPER GASTROINTESTINAL ENDOSCOPY     Family History  Problem Relation Age of Onset   Aneurysm Mother        brain   Celiac disease Brother    Diabetes Maternal Grandfather    Hypertension Neg Hx    Breast cancer Neg Hx    Coronary artery disease Neg Hx    Colon cancer Neg Hx    Esophageal cancer Neg Hx    Stomach cancer Neg Hx    Rectal cancer Neg Hx    Colon polyps Neg Hx    Social History   Socioeconomic History   Marital status: Widowed    Spouse name: Not on file   Number of children: 1   Years of education: Not on file   Highest education level: Doctorate  Occupational History   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  Tobacco Use   Smoking status: Never    Passive exposure: Never   Smokeless tobacco: Never  Vaping Use   Vaping status: Never Used  Substance and Sexual Activity   Alcohol use: Yes    Comment: socially   Drug use: No   Sexual activity: Not  Currently  Other Topics Concern   Not on file  Social History Narrative   2 step children, 1 child , 7 GK   Lost husband, moved [place] 02-13-2020 ; apartment, 3th floor w/ elevator      Pt is right handed   Social Drivers of Health   Financial Resource Strain: Low Risk  (08/14/2023)   Overall Financial Resource Strain (CARDIA)    Difficulty of Paying Living Expenses: Not hard at all  Food Insecurity: No Food Insecurity (08/14/2023)  Hunger Vital Sign    Worried About Running Out of Food in the Last Year: Never true    Ran Out of Food in the Last Year: Never true  Transportation Needs: No Transportation Needs (08/14/2023)   PRAPARE - Administrator, Civil Service (Medical): No    Lack of Transportation (Non-Medical): No  Physical Activity: Insufficiently Active (08/14/2023)   Exercise Vital Sign    Days of Exercise per Week: 2 days    Minutes of Exercise per Session: 40 min  Stress: No Stress Concern Present (08/14/2023)   [employer] of Occupational Health - Occupational Stress Questionnaire    Feeling of Stress : Not at all  Social Connections: Moderately Integrated (08/14/2023)   Social Connection and Isolation Panel [NHANES]    Frequency of Communication with Friends and Family: More than three times a week    Frequency of Social Gatherings with Friends and Family: More than three times a week    Attends Religious Services: More than 4 times per year    Active Member of [employer] or Organizations: Yes    Attends Banker Meetings: More than 4 times per year    Marital Status: Widowed    Tobacco Counseling Counseling given: Not Answered    Clinical Intake:  Pre-visit preparation completed: Yes  Pain : No/denies pain     BMI - recorded: 29.58 Nutritional Status: BMI 25 -29 Overweight Nutritional Risks: None Diabetes: No  Lab Results  Component Value Date   HGBA1C 5.2 09/15/2019     How often do you need to have someone help you when you  read instructions, pamphlets, or other written materials from your doctor or pharmacy?: 1 - Never  Interpreter Needed?: No  Information entered by :: Theresa Mulligan LPN   Activities of Daily Living     08/14/2023    2:44 PM  In your present state of health, do you have any difficulty performing the following activities:  Hearing? 0  Vision? 0  Difficulty concentrating or making decisions? 0  Walking or climbing stairs? 0  Dressing or bathing? 0  Doing errands, shopping? 0  Preparing Food and eating ? N  Using the Toilet? N  In the past six months, have you accidently leaked urine? N  Do you have problems with loss of bowel control? N  Managing your Medications? N  Managing your Finances? N  Housekeeping or managing your Housekeeping? N    Patient Care Team: Wanda Plump, MD as PCP - General Larence Penning, OD as Referring Physician (Optometry) Drema Dallas, DO as Consulting Physician (Neurology) Joneen Caraway, PA-C as Physician Assistant (Dermatology) Danford Bad, DC as Consulting Physician (Chiropractic Medicine)  Indicate any recent Medical Services you may have received from other than [facility] providers in the past year (date may be approximate).     Assessment:   This is a routine wellness examination for Cynthia [name].  Hearing/Vision screen Hearing Screening - Comments:: Denies hearing difficulties   Vision Screening - Comments:: Wears rx glasses - up to date with routine eye exams with  [facility]   Goals Addressed               This Visit's Progress     Remain Active (pt-stated)        Continue Yoga.       Depression Screen     08/14/2023    2:42 PM 07/24/2023   10:20 AM 01/19/2023    9:56 AM 07/31/2022  9:11 AM 07/19/2022    9:20 AM 04/10/2022    2:42 PM 12/01/2021    8:57 AM  PHQ 2/9 Scores  PHQ - 2 Score 0 1 1 1 2 2 2   PHQ- 9 Score 0 3 6  9 5 7     Fall Risk     08/14/2023    2:45 PM 07/24/2023   10:00 AM 03/16/2023   10:09 AM 01/19/2023     9:19 AM 07/31/2022    9:08 AM  Fall Risk   Falls in the past year? 1 1 1 1 1   Number falls in past yr: 0 1 0 0 1  Injury with Fall? 1 0 0 0 0  Comment Bruised left Knee. Followed by medical attention      Risk for fall due to : History of fall(s)    History of fall(s)  Follow up Falls prevention discussed;Falls evaluation completed Falls evaluation completed;Education provided Falls evaluation completed Falls evaluation completed Falls evaluation completed    MEDICARE RISK AT HOME:  Medicare Risk at Home Any stairs in or around the home?: No If so, are there any without handrails?: Yes Home free of loose throw rugs in walkways, pet beds, electrical cords, etc?: Yes Adequate lighting in your home to reduce risk of falls?: Yes Life alert?: No Use of a cane, walker or w/c?: Yes Grab bars in the bathroom?: No Shower chair or bench in shower?: No Elevated toilet seat or a handicapped toilet?: No  TIMED UP AND GO:  Was the test performed?  No  Cognitive Function: 6CIT completed    07/04/2016   10:11 AM  MMSE - Mini Mental State Exam  Orientation to time 5  Orientation to Place 5  Registration 3  Attention/ Calculation 5  Recall 2  Language- name 2 objects 2  Language- repeat 1  Language- follow 3 step command 3  Language- read & follow direction 1  Write a sentence 1  Copy design 1  Total score 29        08/14/2023    2:48 PM 07/31/2022    9:20 AM  6CIT Screen  What Year? 0 points 0 points  What month? 0 points 0 points  What time? 0 points 0 points  Count back from 20 0 points 0 points  Months in reverse 0 points 0 points  Repeat phrase 0 points 0 points  Total Score 0 points 0 points    Immunizations Immunization History  Administered Date(s) Administered   Fluad Quad(high Dose 65+) 01/30/2019, 02/17/2021   Fluzone Influenza virus vaccine,trivalent (IIV3), split virus 03/05/2007, 02/26/2008, 01/28/2010   Influenza Split 03/13/2011, 01/31/2012   Influenza Whole  03/05/2007, 02/26/2008, 01/28/2010   Influenza, High Dose Seasonal PF 03/06/2013, 02/15/2015, 02/14/2016, 03/19/2018, 02/10/2020, 02/21/2021, 02/13/2022   Influenza-Unspecified 02/14/2017, 02/15/2023   Moderna Covid-19 Fall Seasonal Vaccine 39yrs & older 02/22/2022   PFIZER(Purple Top)SARS-COV-2 Vaccination 06/06/2019, 06/27/2019, 03/09/2020, 09/01/2020   Pfizer Covid-19 Vaccine Bivalent Booster 19yrs & up 01/26/2021, 02/22/2022   Pneumococcal Conjugate-13 07/03/2014   Pneumococcal Polysaccharide-23 11/09/2009, 04/26/2020   Respiratory Syncytial Virus Vaccine,Recomb Aduvanted(Arexvy) 07/19/2022   Td 05/16/1999, 11/09/2009   Tdap 02/27/2013   Unspecified SARS-COV-2 Vaccination 10/05/2021   Zoster Recombinant(Shingrix) 01/29/2018, 04/16/2018   Zoster, Live 12/06/2007    Screening Tests Health Maintenance  Topic Date Due   DTaP/Tdap/Td (4 - Td or Tdap) 02/28/2023   COVID-19 Vaccine (8 - 2024-25 season) 01/23/2024 (Originally 01/14/2023)   INFLUENZA VACCINE  12/14/2023   Medicare Annual Wellness (  AWV)  08/13/2024   Colonoscopy  07/11/2028   Pneumonia Vaccine 5+ Years old  Completed   DEXA SCAN  Completed   Hepatitis C Screening  Completed   Zoster Vaccines- Shingrix  Completed   HPV VACCINES  Aged Out    Health Maintenance  Health Maintenance Due  Topic Date Due   DTaP/Tdap/Td (4 - Td or Tdap) 02/28/2023   Health Maintenance Items Addressed:    Additional Screening:  Vision Screening: Recommended annual ophthalmology exams for early detection of glaucoma and other disorders of the eye.  Dental Screening: Recommended annual dental exams for proper oral hygiene  Community Resource Referral / Chronic Care Management: CRR required this visit?  No   CCM required this visit?  No     Plan:     I have personally reviewed and noted the following in the patient's chart:   Medical and social history Use of alcohol, tobacco or illicit drugs  Current medications and  supplements including opioid prescriptions. Patient is currently taking opioid prescriptions. Information provided to patient regarding non-opioid alternatives. Patient advised to discuss non-opioid treatment plan with their provider. Functional ability and status Nutritional status Physical activity Advanced directives List of other physicians Hospitalizations, surgeries, and ER visits in previous 12 months Vitals Screenings to include cognitive, depression, and falls Referrals and appointments  In addition, I have reviewed and discussed with patient certain preventive protocols, quality metrics, and best practice recommendations. A written personalized care plan for preventive services as well as general preventive health recommendations were provided to patient.     Tillie Rung, LPN   01/18/453   After Visit Summary: (MyChart) Due to this being a telephonic visit, the after visit summary with patients personalized plan was offered to patient via MyChart   Notes: Nothing significant to report at this time.

## 2023-08-21 DIAGNOSIS — M791 Myalgia, unspecified site: Secondary | ICD-10-CM | POA: Diagnosis not present

## 2023-08-21 DIAGNOSIS — M5442 Lumbago with sciatica, left side: Secondary | ICD-10-CM | POA: Diagnosis not present

## 2023-08-22 ENCOUNTER — Other Ambulatory Visit: Payer: Self-pay | Admitting: Internal Medicine

## 2023-09-05 DIAGNOSIS — H9313 Tinnitus, bilateral: Secondary | ICD-10-CM | POA: Diagnosis not present

## 2023-09-05 DIAGNOSIS — H903 Sensorineural hearing loss, bilateral: Secondary | ICD-10-CM | POA: Diagnosis not present

## 2023-09-06 NOTE — Progress Notes (Signed)
 NEUROLOGY FOLLOW UP OFFICE NOTE  Cynthia [name] [id number]  Assessment/Plan:   1.  Cervicogenic headache with cervical spondylosis 2.  Migraine without aura, without status migrainosus, not intractable - improved  4.  Transient left hemisensory loss.  TIA vs migraine.  As no prior history, must assume TIA. 5.  Small Acomm aneurysm, stable    1.  Gabapentin  200mg  twice daily; methocarbamol  500mg  three times daily as needed for acute headache/neck pain 2.  Consider repeat MRA brain in 4 years (unless clinically indicated sooner) 3.  Secondary stroke prevention as managed by PCP:  ASA 81mg  daily, LDL goal less than 70, normotensive blood pressure. 4.  Follow up in 6 months.   Subjective:  Cynthia [name] is a 78 year old right-handed woman with depression, glaucoma, degenerative joint disease and history of TIA who follows up for headaches   UPDATE: Migraines remain controlled and are infrequent.    Due to worsening cervicogenic headache, gabapentin  was increased.  Headaches have decreased to every 7 to 10 days.  Lasts all afternoon or morning.  Does not respond to Tylenol  or tramadol .    Current NSAIDS:  ASA 81mg  daily Current analgesics:  acetaminophen -caffeine Current triptans:  none Current ergotamine:  no Current anti-emetic: Zofran  Current muscle relaxants:  Robaxin  500mg  TID (rarely) Current anti-anxiolytic:  no Current sleep aide:  Ambien  Current Antihypertensive medications:  verapamil  CR 180mg  Current Antidepressant medications:  Sertraline  100mg , bupropion  300mg  Current Anticonvulsant medications:  gabapentin  200mg  at bedtime Current anti-CGRP: none Current Vitamins/Herbal/Supplements:  B complex, Mg Current Antihistamines/Decongestants:  Mucinex , Astelin  Other therapy: 1 cup black coffee with melatonin 10mg  at bedtime Other medication: Zetia    No subsequent stroke-like symptoms.   Depression and anxiety: Yes Other pain: Joint pain   HISTORY: Migraines:  She  started having headaches infrequently in 2015 but progressively became more frequent in 2016.  Varies, but they are usually left sided, involving the ear, but also may be right sided (involving the sinuses) or in band-like distribution.  It is both throbbing and non-throbbing.  Initially, it is usually 5/10 but severe episodes are 7-8/10.  Sometimes there is nausea.  There is phonophobia.  There is no associated photophobia, visual disturbance or unilateral numbness or weakness.  Initially, it lasts a couple of hours and occuring 15 days out of the month (3 to 4 days severe).  It wakes her up at 4:30 to 5 AM.  She reports sensation of congestion but no runny nose.  CT of sinuses showed opacified right sphenoid sinus and ethmoid air cell.  She was treated a couple of times with Augmentin  and prednisone , which was effective for a while but headaches then returned.  She cannot think of a trigger.  They are worse during the Cynthia.  There are no aggravating or relieving factors.  MRI of brain from 06/10/15 was normal.  Sed Rate from 12/27/15 was 10.    Cervicogenic headache: She notes another headache across the back of her head when she lays down.  She does have neck pain with cervical spondylosis.  Around December 2022, she started experiencing severe paroxysmal ear pain, mostly right ear but also left ear, every other day.  No aural fullness.  No change in tinnitus.  Hearing slightly worse but not significant.  Sometimes jaw pain. Cervical X-ray from 08/23/2021 revealed multilevel degenerative changes with mild degenerative changes with mild degenerative anterolisthesis of C3 on C4 and disc space narrowing with osteophytic changes at C5-6 and C6-7 but the odontoid  was within normal limits.  Chiropractic therapy and acupuncture were ineffective.     TIA vs Migraine: She was admitted to [facility] on [facility]/2019 for TIA presenting as transient left sided hemibody numbness (first leg, then arm, then face).   Mostly resolved quickly but lingered for several hours.  CT head showed right internal capsule hypodensity but follow up MRI of brain showed no acute infarct.  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  2D echo showed EF 60-65% with no source of embolus.  LDL was 162 and Hgb A1c was 5.2.  She was started on ASA 81mg  daily and Plavix  75mg  for 3 weeks followed by ASA alone.  The next morning she had a habitual migraine headache except it lasted all day.  She was also started on Zetia  (she has statin intolerance).  Unclear if event was TIA or complicated migraine.   Due to worsening headache, ordered an MRA of head .  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      Past NSAIDS:  ibuprofen  (ulcers) Past analgesics:  Tylenol , tramadol  Past abortive triptans:  sumatriptan  (advised to discontinue due to age) Past abortive ergotamine:  no Past muscle relaxants:  no Past anti-emetic:  no Past antihypertensive medications:  no Past antidepressant medications:  Nortriptyline  50mg  Past anticonvulsant medications:  topiramate  25mg  (concerned about glaucoma) Past anti-CGRP:  Emgality  (effective but no longer covered by insurance) Past vitamins/Herbal/Supplements:  no Past antihistamines/decongestants:  no Other past therapies:  PT of neck  Imaging: 06/29/2022 MRA HEAD WO:  1. 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 2. No intracranial large vessel occlusion or significant stenosis. 08/23/2020 MRI/MRA HEAD WO:  1. No evidence of acute intracranial abnormality. 2. No significant stenosis. 3. Similar size/appearance of an approximately 2 mm anterior communicating artery aneurysm. 09/14/2019 MRI BRAIN/MRA HEAD & NECK:  1. Mild chronic small vessel disease without acute intracranial abnormality. 2. 2 mm anterior communicating artery  aneurysm. 3. Otherwise normal head and neck MRA.  PAST MEDICAL HISTORY: Past Medical History:  Diagnosis Date   Allergy    Anemia    Anxiety    Arthritis    Blood transfusion without reported diagnosis    Cataract    Constipation, chronic    DEGENERATIVE JOINT DISEASE 06/02/2010   Depression    GERD (gastroesophageal reflux disease)    Glaucoma 05/15/2012   Right eye   Headache(784.0)    Hyperlipidemia    hx of   Osteopenia    Osteoporosis    Psoriasis    PUD (peptic ulcer disease) 05/15/2010   Upper GI bleed d/t NSAIDs   RLS (restless legs syndrome)    robaxin  prn   TIA (transient ischemic attack) 09/2019    MEDICATIONS: Current Outpatient Medications on File Prior to Visit  Medication Sig Dispense Refill   aspirin  EC 81 MG tablet Take 81 mg by mouth daily. Swallow whole.     buPROPion  (WELLBUTRIN  XL) 300 MG 24 hr tablet Take 1 tablet (300 mg total) by mouth daily. 90 tablet 1   cetirizine (ZYRTEC) 10 MG tablet Take 10 mg by mouth at bedtime as needed for allergies.     cholecalciferol (VITAMIN D3) 10 MCG (400 UNIT) TABS tablet Take by mouth.     ezetimibe  (ZETIA ) 10 MG tablet Take 1 tablet (10 mg total) by mouth daily. 90 tablet 1  fluticasone  (FLONASE ) 50 MCG/ACT nasal spray Place 2 sprays into both nostrils daily. 16 g 6   gabapentin  (NEURONTIN ) 100 MG capsule take ONE CAPSULE IN MORNING AND 2 CAPSULES AT BEDTIME FOR ONE WEEK, THEN TWO CAPSULES IN MORNING AND TWO CAPSULES AT BEDTIME FOR ONE WEEK, THEN THREE CAPSULES TWICE DAILY. 180 capsule 5   ketoconazole  (NIZORAL ) 2 % cream Apply 1 Application topically daily. 30 g 0   methocarbamol  (ROBAXIN ) 500 MG tablet TAKE ONE (1) TABLET BY MOUTH 3 TIMES DAILY AS NEEDED FOR MUSCLE SPASMS 90 tablet 1   Multiple Vitamins-Minerals (ICAPS AREDS 2 PO) Take by mouth.     nystatin  (MYCOSTATIN ) 100000 UNIT/ML suspension Take 5 mLs (500,000 Units total) by mouth 4 (four) times daily. 120 mL 1   omeprazole  (PRILOSEC) 40 MG capsule Take  1 capsule (40 mg total) by mouth daily. 90 capsule 1   promethazine -dextromethorphan (PROMETHAZINE -DM) 6.25-15 MG/5ML syrup Take 5 mLs by mouth 4 (four) times daily as needed for cough. (Patient not taking: Reported on 07/24/2023) 118 mL 0   propranolol  (INDERAL ) 10 MG tablet Take 1 tablet (10 mg total) by mouth 3 (three) times daily. 270 tablet 1   sertraline  (ZOLOFT ) 100 MG tablet Take 2 tablets (200 mg total) by mouth daily. 180 tablet 1   traMADol  (ULTRAM ) 50 MG tablet Take 1 tablet (50 mg total) by mouth daily as needed. 30 tablet 0   Travoprost, BAK Free, (TRAVATAN) 0.004 % SOLN ophthalmic solution Place 1 drop into the right eye at bedtime.     zolpidem  (AMBIEN ) 5 MG tablet Take 1 tablet (5 mg total) by mouth at bedtime as needed for sleep. 30 tablet 1   No current facility-administered medications on file prior to visit.    ALLERGIES: Allergies  Allergen Reactions   Bee Pollen Other (See Comments)   Motrin  [Ibuprofen ]     GI bleed   Pollen Extract    Statins     "delibitating pain all over body"   Sulfonamide Derivatives     Pt unsure of reaction   Ciprofloxacin Swelling and Rash    FAMILY HISTORY: Family History  Problem Relation Age of Onset   Aneurysm Mother        brain   Celiac disease Brother    Diabetes Maternal Grandfather    Hypertension Neg Hx    Breast cancer Neg Hx    Coronary artery disease Neg Hx    Colon cancer Neg Hx    Esophageal cancer Neg Hx    Stomach cancer Neg Hx    Rectal cancer Neg Hx    Colon polyps Neg Hx       Objective:  Blood pressure (!) 140/80, pulse 72, height 5\' 4"  (1.626 m), weight 173 lb (78.5 kg), SpO2 98%. General: No acute distress.  Patient appears well-groomed.      Janne Members, DO  CC: Devonna Foley, MD

## 2023-09-12 ENCOUNTER — Other Ambulatory Visit: Payer: Self-pay | Admitting: Internal Medicine

## 2023-09-13 DIAGNOSIS — M5459 Other low back pain: Secondary | ICD-10-CM | POA: Diagnosis not present

## 2023-09-13 DIAGNOSIS — R260 Ataxic gait: Secondary | ICD-10-CM | POA: Diagnosis not present

## 2023-09-13 DIAGNOSIS — M545 Low back pain, unspecified: Secondary | ICD-10-CM | POA: Diagnosis not present

## 2023-09-13 DIAGNOSIS — M791 Myalgia, unspecified site: Secondary | ICD-10-CM | POA: Diagnosis not present

## 2023-09-13 DIAGNOSIS — M6281 Muscle weakness (generalized): Secondary | ICD-10-CM | POA: Diagnosis not present

## 2023-09-17 ENCOUNTER — Encounter: Payer: Self-pay | Admitting: Neurology

## 2023-09-17 ENCOUNTER — Ambulatory Visit: Payer: Medicare PPO | Admitting: Neurology

## 2023-09-17 VITALS — BP 140/80 | HR 72 | Ht 64.0 in | Wt 173.0 lb

## 2023-09-17 DIAGNOSIS — I671 Cerebral aneurysm, nonruptured: Secondary | ICD-10-CM

## 2023-09-17 DIAGNOSIS — G4486 Cervicogenic headache: Secondary | ICD-10-CM | POA: Diagnosis not present

## 2023-09-17 DIAGNOSIS — G459 Transient cerebral ischemic attack, unspecified: Secondary | ICD-10-CM

## 2023-09-17 MED ORDER — METHOCARBAMOL 500 MG PO TABS: ORAL_TABLET | ORAL | 5 refills | Status: DC

## 2023-09-19 DIAGNOSIS — M6281 Muscle weakness (generalized): Secondary | ICD-10-CM | POA: Diagnosis not present

## 2023-09-19 DIAGNOSIS — M5459 Other low back pain: Secondary | ICD-10-CM | POA: Diagnosis not present

## 2023-09-19 DIAGNOSIS — R260 Ataxic gait: Secondary | ICD-10-CM | POA: Diagnosis not present

## 2023-09-21 DIAGNOSIS — M6281 Muscle weakness (generalized): Secondary | ICD-10-CM | POA: Diagnosis not present

## 2023-09-21 DIAGNOSIS — R260 Ataxic gait: Secondary | ICD-10-CM | POA: Diagnosis not present

## 2023-09-21 DIAGNOSIS — M5459 Other low back pain: Secondary | ICD-10-CM | POA: Diagnosis not present

## 2023-09-26 DIAGNOSIS — R260 Ataxic gait: Secondary | ICD-10-CM | POA: Diagnosis not present

## 2023-09-26 DIAGNOSIS — M5459 Other low back pain: Secondary | ICD-10-CM | POA: Diagnosis not present

## 2023-09-26 DIAGNOSIS — M6281 Muscle weakness (generalized): Secondary | ICD-10-CM | POA: Diagnosis not present

## 2023-09-28 DIAGNOSIS — H04123 Dry eye syndrome of bilateral lacrimal glands: Secondary | ICD-10-CM | POA: Diagnosis not present

## 2023-09-28 DIAGNOSIS — Z961 Presence of intraocular lens: Secondary | ICD-10-CM | POA: Diagnosis not present

## 2023-09-28 DIAGNOSIS — H353132 Nonexudative age-related macular degeneration, bilateral, intermediate dry stage: Secondary | ICD-10-CM | POA: Diagnosis not present

## 2023-09-28 DIAGNOSIS — R260 Ataxic gait: Secondary | ICD-10-CM | POA: Diagnosis not present

## 2023-09-28 DIAGNOSIS — H43813 Vitreous degeneration, bilateral: Secondary | ICD-10-CM | POA: Diagnosis not present

## 2023-09-28 DIAGNOSIS — H52203 Unspecified astigmatism, bilateral: Secondary | ICD-10-CM | POA: Diagnosis not present

## 2023-09-28 DIAGNOSIS — M6281 Muscle weakness (generalized): Secondary | ICD-10-CM | POA: Diagnosis not present

## 2023-09-28 DIAGNOSIS — H5203 Hypermetropia, bilateral: Secondary | ICD-10-CM | POA: Diagnosis not present

## 2023-09-28 DIAGNOSIS — H26493 Other secondary cataract, bilateral: Secondary | ICD-10-CM | POA: Diagnosis not present

## 2023-09-28 DIAGNOSIS — H401131 Primary open-angle glaucoma, bilateral, mild stage: Secondary | ICD-10-CM | POA: Diagnosis not present

## 2023-09-28 DIAGNOSIS — H524 Presbyopia: Secondary | ICD-10-CM | POA: Diagnosis not present

## 2023-09-28 DIAGNOSIS — M5459 Other low back pain: Secondary | ICD-10-CM | POA: Diagnosis not present

## 2023-10-01 ENCOUNTER — Other Ambulatory Visit: Payer: Self-pay | Admitting: Internal Medicine

## 2023-10-01 DIAGNOSIS — K209 Esophagitis, unspecified without bleeding: Secondary | ICD-10-CM

## 2023-10-01 DIAGNOSIS — K449 Diaphragmatic hernia without obstruction or gangrene: Secondary | ICD-10-CM

## 2023-10-01 DIAGNOSIS — K2980 Duodenitis without bleeding: Secondary | ICD-10-CM

## 2023-10-01 DIAGNOSIS — K259 Gastric ulcer, unspecified as acute or chronic, without hemorrhage or perforation: Secondary | ICD-10-CM

## 2023-10-01 DIAGNOSIS — K219 Gastro-esophageal reflux disease without esophagitis: Secondary | ICD-10-CM

## 2023-10-01 DIAGNOSIS — R1310 Dysphagia, unspecified: Secondary | ICD-10-CM

## 2023-10-03 DIAGNOSIS — R260 Ataxic gait: Secondary | ICD-10-CM | POA: Diagnosis not present

## 2023-10-03 DIAGNOSIS — M6281 Muscle weakness (generalized): Secondary | ICD-10-CM | POA: Diagnosis not present

## 2023-10-03 DIAGNOSIS — M5459 Other low back pain: Secondary | ICD-10-CM | POA: Diagnosis not present

## 2023-10-05 DIAGNOSIS — R260 Ataxic gait: Secondary | ICD-10-CM | POA: Diagnosis not present

## 2023-10-05 DIAGNOSIS — M6281 Muscle weakness (generalized): Secondary | ICD-10-CM | POA: Diagnosis not present

## 2023-10-05 DIAGNOSIS — M5459 Other low back pain: Secondary | ICD-10-CM | POA: Diagnosis not present

## 2023-10-10 DIAGNOSIS — M5459 Other low back pain: Secondary | ICD-10-CM | POA: Diagnosis not present

## 2023-10-10 DIAGNOSIS — R260 Ataxic gait: Secondary | ICD-10-CM | POA: Diagnosis not present

## 2023-10-10 DIAGNOSIS — M6281 Muscle weakness (generalized): Secondary | ICD-10-CM | POA: Diagnosis not present

## 2023-10-12 DIAGNOSIS — R260 Ataxic gait: Secondary | ICD-10-CM | POA: Diagnosis not present

## 2023-10-12 DIAGNOSIS — M5459 Other low back pain: Secondary | ICD-10-CM | POA: Diagnosis not present

## 2023-10-12 DIAGNOSIS — M6281 Muscle weakness (generalized): Secondary | ICD-10-CM | POA: Diagnosis not present

## 2023-10-15 DIAGNOSIS — R260 Ataxic gait: Secondary | ICD-10-CM | POA: Diagnosis not present

## 2023-10-15 DIAGNOSIS — M6281 Muscle weakness (generalized): Secondary | ICD-10-CM | POA: Diagnosis not present

## 2023-10-19 DIAGNOSIS — R260 Ataxic gait: Secondary | ICD-10-CM | POA: Diagnosis not present

## 2023-10-19 DIAGNOSIS — M6281 Muscle weakness (generalized): Secondary | ICD-10-CM | POA: Diagnosis not present

## 2023-10-24 DIAGNOSIS — R260 Ataxic gait: Secondary | ICD-10-CM | POA: Diagnosis not present

## 2023-10-24 DIAGNOSIS — M6281 Muscle weakness (generalized): Secondary | ICD-10-CM | POA: Diagnosis not present

## 2023-10-26 DIAGNOSIS — M6281 Muscle weakness (generalized): Secondary | ICD-10-CM | POA: Diagnosis not present

## 2023-10-26 DIAGNOSIS — R260 Ataxic gait: Secondary | ICD-10-CM | POA: Diagnosis not present

## 2023-10-28 DIAGNOSIS — M79672 Pain in left foot: Secondary | ICD-10-CM | POA: Diagnosis not present

## 2023-10-28 DIAGNOSIS — M722 Plantar fascial fibromatosis: Secondary | ICD-10-CM | POA: Diagnosis not present

## 2023-10-31 DIAGNOSIS — R260 Ataxic gait: Secondary | ICD-10-CM | POA: Diagnosis not present

## 2023-10-31 DIAGNOSIS — M6281 Muscle weakness (generalized): Secondary | ICD-10-CM | POA: Diagnosis not present

## 2023-11-02 DIAGNOSIS — R260 Ataxic gait: Secondary | ICD-10-CM | POA: Diagnosis not present

## 2023-11-02 DIAGNOSIS — M6281 Muscle weakness (generalized): Secondary | ICD-10-CM | POA: Diagnosis not present

## 2023-11-06 DIAGNOSIS — M6281 Muscle weakness (generalized): Secondary | ICD-10-CM | POA: Diagnosis not present

## 2023-11-06 DIAGNOSIS — R260 Ataxic gait: Secondary | ICD-10-CM | POA: Diagnosis not present

## 2023-11-13 DIAGNOSIS — M6281 Muscle weakness (generalized): Secondary | ICD-10-CM | POA: Diagnosis not present

## 2023-11-13 DIAGNOSIS — R260 Ataxic gait: Secondary | ICD-10-CM | POA: Diagnosis not present

## 2023-11-13 DIAGNOSIS — L408 Other psoriasis: Secondary | ICD-10-CM | POA: Diagnosis not present

## 2023-11-13 DIAGNOSIS — L820 Inflamed seborrheic keratosis: Secondary | ICD-10-CM | POA: Diagnosis not present

## 2023-11-15 DIAGNOSIS — R260 Ataxic gait: Secondary | ICD-10-CM | POA: Diagnosis not present

## 2023-11-15 DIAGNOSIS — M6281 Muscle weakness (generalized): Secondary | ICD-10-CM | POA: Diagnosis not present

## 2023-11-20 DIAGNOSIS — R260 Ataxic gait: Secondary | ICD-10-CM | POA: Diagnosis not present

## 2023-11-20 DIAGNOSIS — M6281 Muscle weakness (generalized): Secondary | ICD-10-CM | POA: Diagnosis not present

## 2023-11-22 DIAGNOSIS — R260 Ataxic gait: Secondary | ICD-10-CM | POA: Diagnosis not present

## 2023-11-22 DIAGNOSIS — M6281 Muscle weakness (generalized): Secondary | ICD-10-CM | POA: Diagnosis not present

## 2023-11-27 DIAGNOSIS — R260 Ataxic gait: Secondary | ICD-10-CM | POA: Diagnosis not present

## 2023-11-27 DIAGNOSIS — M6281 Muscle weakness (generalized): Secondary | ICD-10-CM | POA: Diagnosis not present

## 2023-11-28 ENCOUNTER — Telehealth: Payer: Self-pay | Admitting: Internal Medicine

## 2023-11-28 NOTE — Telephone Encounter (Signed)
 Requesting: Ambien  5mg   Contract: 07/27/22 UDS: None Last Visit: 07/24/23 Next Visit: 01/25/24 Last Refill: 01/19/23 #30 and 1rf   Please Advise

## 2023-11-28 NOTE — Telephone Encounter (Signed)
 PDMP okay, Rx sent

## 2023-11-29 DIAGNOSIS — M6281 Muscle weakness (generalized): Secondary | ICD-10-CM | POA: Diagnosis not present

## 2023-11-29 DIAGNOSIS — R260 Ataxic gait: Secondary | ICD-10-CM | POA: Diagnosis not present

## 2023-12-04 DIAGNOSIS — R260 Ataxic gait: Secondary | ICD-10-CM | POA: Diagnosis not present

## 2023-12-04 DIAGNOSIS — M6281 Muscle weakness (generalized): Secondary | ICD-10-CM | POA: Diagnosis not present

## 2023-12-04 NOTE — Telephone Encounter (Signed)
 [facility] [employer] [facility] [employer]  Patient is ready for scheduling on or after: 12/21/23  Out-of-pocket cost due at time of visit: $375.84  Primary: [employer] Prolia  co-insurance: 20% (approximately $350.84) Admin fee co-insurance: 20% (approximately $25)  Deductible:n/a   Prior Auth: Approved Ref #: [id number] Valid: 05/15/22 - 05/14/24    ** This summary of benefits is an estimation of the patient's out-of-pocket cost. Exact cost may vary based on individual plan coverage.

## 2023-12-05 ENCOUNTER — Encounter (HOSPITAL_COMMUNITY): Payer: Self-pay

## 2023-12-05 ENCOUNTER — Other Ambulatory Visit: Payer: Self-pay | Admitting: *Deleted

## 2023-12-05 ENCOUNTER — Other Ambulatory Visit: Payer: Self-pay

## 2023-12-05 MED ORDER — DENOSUMAB 60 MG/ML ~~LOC~~ SOSY
60.0000 mg | PREFILLED_SYRINGE | Freq: Once | SUBCUTANEOUS | 0 refills | Status: AC
  Filled 2023-12-05: qty 1, 1d supply, fill #0
  Filled 2023-12-07: qty 1, 180d supply, fill #0

## 2023-12-06 ENCOUNTER — Other Ambulatory Visit: Payer: Self-pay

## 2023-12-06 DIAGNOSIS — M6281 Muscle weakness (generalized): Secondary | ICD-10-CM | POA: Diagnosis not present

## 2023-12-06 DIAGNOSIS — R260 Ataxic gait: Secondary | ICD-10-CM | POA: Diagnosis not present

## 2023-12-06 NOTE — Progress Notes (Signed)
 Pharmacy Patient Advocate Encounter  Insurance verification completed.   The patient is insured through [facility]   Ran test claim for Prolia . Co-pay is $50.  This test claim was processed through [facility]- copay amounts may vary at other pharmacies due to pharmacy/plan contracts, or as the patient moves through the different stages of their insurance plan.

## 2023-12-07 ENCOUNTER — Encounter: Payer: Self-pay | Admitting: Neurology

## 2023-12-07 ENCOUNTER — Other Ambulatory Visit: Payer: Self-pay

## 2023-12-07 DIAGNOSIS — I671 Cerebral aneurysm, nonruptured: Secondary | ICD-10-CM

## 2023-12-07 NOTE — Progress Notes (Signed)
 Specialty Pharmacy Initial Fill Coordination Note  Cynthia [name] is a 78 y.o. female contacted today regarding initial fill of specialty medication(s) Denosumab  (PROLIA )   Patient requested Courier to Provider Office   Delivery date: 12/11/23   Verified address: [address] [place] [address]   Medication will be filled on 7/28.   Patient is aware of $50 copayment.

## 2023-12-09 ENCOUNTER — Ambulatory Visit

## 2023-12-09 DIAGNOSIS — S90122A Contusion of left lesser toe(s) without damage to nail, initial encounter: Secondary | ICD-10-CM | POA: Diagnosis not present

## 2023-12-09 DIAGNOSIS — M79675 Pain in left toe(s): Secondary | ICD-10-CM | POA: Diagnosis not present

## 2023-12-10 ENCOUNTER — Other Ambulatory Visit: Payer: Self-pay | Admitting: Neurology

## 2023-12-10 ENCOUNTER — Other Ambulatory Visit: Payer: Self-pay

## 2023-12-10 MED ORDER — GABAPENTIN 300 MG PO CAPS: 300.0000 mg | ORAL_CAPSULE | Freq: Two times a day (BID) | ORAL | 5 refills | Status: DC

## 2023-12-10 MED ORDER — BACLOFEN 10 MG PO TABS: 10.0000 mg | ORAL_TABLET | Freq: Three times a day (TID) | ORAL | 5 refills | Status: AC | PRN

## 2023-12-12 ENCOUNTER — Other Ambulatory Visit (HOSPITAL_COMMUNITY): Payer: Self-pay

## 2023-12-19 ENCOUNTER — Encounter: Payer: Self-pay | Admitting: Neurology

## 2023-12-30 ENCOUNTER — Ambulatory Visit
Admission: RE | Admit: 2023-12-30 | Discharge: 2023-12-30 | Disposition: A | Discharge status: Home / Self Care | Source: Ambulatory Visit | Attending: Neurology | Admitting: Neurology

## 2023-12-30 DIAGNOSIS — I671 Cerebral aneurysm, nonruptured: Secondary | ICD-10-CM

## 2024-01-01 NOTE — Telephone Encounter (Signed)
 Patient is getting Prolia  from pharm for $50.

## 2024-01-02 ENCOUNTER — Other Ambulatory Visit: Payer: Self-pay | Admitting: *Deleted

## 2024-01-02 DIAGNOSIS — M79675 Pain in left toe(s): Secondary | ICD-10-CM | POA: Diagnosis not present

## 2024-01-02 DIAGNOSIS — M810 Age-related osteoporosis without current pathological fracture: Secondary | ICD-10-CM

## 2024-01-07 ENCOUNTER — Ambulatory Visit

## 2024-01-07 ENCOUNTER — Other Ambulatory Visit

## 2024-01-07 DIAGNOSIS — M810 Age-related osteoporosis without current pathological fracture: Secondary | ICD-10-CM | POA: Diagnosis not present

## 2024-01-08 LAB — VITAMIN D 25 HYDROXY (VIT D DEFICIENCY, FRACTURES): Vit D, 25-Hydroxy: 34.5 ng/mL (ref 30.0–100.0)

## 2024-01-08 LAB — BASIC METABOLIC PANEL WITH GFR
BUN/Creatinine Ratio: 17 (ref 12–28)
BUN: 14 mg/dL (ref 8–27)
CO2: 19 mmol/L — ABNORMAL LOW (ref 20–29)
Calcium: 9.6 mg/dL (ref 8.7–10.3)
Chloride: 98 mmol/L (ref 96–106)
Creatinine, Ser: 0.83 mg/dL (ref 0.57–1.00)
Glucose: 95 mg/dL (ref 70–99)
Potassium: 5.3 mmol/L — ABNORMAL HIGH (ref 3.5–5.2)
Sodium: 135 mmol/L (ref 134–144)
eGFR: 72 mL/min/1.73 (ref >59)

## 2024-01-09 ENCOUNTER — Ambulatory Visit (INDEPENDENT_AMBULATORY_CARE_PROVIDER_SITE_OTHER)

## 2024-01-09 DIAGNOSIS — M810 Age-related osteoporosis without current pathological fracture: Secondary | ICD-10-CM | POA: Diagnosis not present

## 2024-01-09 MED ORDER — DENOSUMAB 60 MG/ML ~~LOC~~ SOSY
60.0000 mg | PREFILLED_SYRINGE | SUBCUTANEOUS | Status: AC
  Administered 2024-01-09: 60 mg via SUBCUTANEOUS

## 2024-01-09 NOTE — Progress Notes (Signed)
 Patient is here for nurse visit for Prolia  60 mg [place] inj in left arm. Patient tolerated injection well. She will return for her next injection in 6 months. She will call us  if she develops problems with the injection.

## 2024-01-10 ENCOUNTER — Ambulatory Visit: Payer: Self-pay | Admitting: Family Medicine

## 2024-01-11 ENCOUNTER — Encounter: Payer: Self-pay | Admitting: *Deleted

## 2024-01-11 ENCOUNTER — Ambulatory Visit: Payer: Self-pay | Admitting: Neurology

## 2024-01-23 ENCOUNTER — Other Ambulatory Visit: Payer: Self-pay | Admitting: Internal Medicine

## 2024-01-25 ENCOUNTER — Ambulatory Visit: Admitting: Internal Medicine

## 2024-02-01 ENCOUNTER — Inpatient Hospital Stay (HOSPITAL_COMMUNITY)
Admission: EM | Admit: 2024-02-01 | Discharge: 2024-02-04 | DRG: 641 | Disposition: A | Discharge status: Home / Self Care | Attending: Family Medicine | Admitting: Family Medicine

## 2024-02-01 ENCOUNTER — Emergency Department (HOSPITAL_COMMUNITY)

## 2024-02-01 ENCOUNTER — Encounter (HOSPITAL_COMMUNITY): Payer: Self-pay

## 2024-02-01 ENCOUNTER — Other Ambulatory Visit: Payer: Self-pay

## 2024-02-01 DIAGNOSIS — R42 Dizziness and giddiness: Secondary | ICD-10-CM | POA: Diagnosis not present

## 2024-02-01 DIAGNOSIS — N3289 Other specified disorders of bladder: Secondary | ICD-10-CM | POA: Diagnosis not present

## 2024-02-01 DIAGNOSIS — G2581 Restless legs syndrome: Secondary | ICD-10-CM | POA: Diagnosis not present

## 2024-02-01 DIAGNOSIS — Z96652 Presence of left artificial knee joint: Secondary | ICD-10-CM | POA: Diagnosis present

## 2024-02-01 DIAGNOSIS — H409 Unspecified glaucoma: Secondary | ICD-10-CM | POA: Diagnosis not present

## 2024-02-01 DIAGNOSIS — E861 Hypovolemia: Secondary | ICD-10-CM | POA: Diagnosis not present

## 2024-02-01 DIAGNOSIS — E871 Hypo-osmolality and hyponatremia: Principal | ICD-10-CM | POA: Diagnosis present

## 2024-02-01 DIAGNOSIS — Z7982 Long term (current) use of aspirin: Secondary | ICD-10-CM | POA: Diagnosis not present

## 2024-02-01 DIAGNOSIS — Z833 Family history of diabetes mellitus: Secondary | ICD-10-CM | POA: Diagnosis not present

## 2024-02-01 DIAGNOSIS — E785 Hyperlipidemia, unspecified: Secondary | ICD-10-CM | POA: Diagnosis not present

## 2024-02-01 DIAGNOSIS — M810 Age-related osteoporosis without current pathological fracture: Secondary | ICD-10-CM | POA: Diagnosis present

## 2024-02-01 DIAGNOSIS — R1111 Vomiting without nausea: Secondary | ICD-10-CM | POA: Diagnosis not present

## 2024-02-01 DIAGNOSIS — G43909 Migraine, unspecified, not intractable, without status migrainosus: Secondary | ICD-10-CM | POA: Diagnosis present

## 2024-02-01 DIAGNOSIS — R109 Unspecified abdominal pain: Secondary | ICD-10-CM | POA: Diagnosis not present

## 2024-02-01 DIAGNOSIS — R5383 Other fatigue: Secondary | ICD-10-CM | POA: Diagnosis not present

## 2024-02-01 DIAGNOSIS — Z8673 Personal history of transient ischemic attack (TIA), and cerebral infarction without residual deficits: Secondary | ICD-10-CM

## 2024-02-01 DIAGNOSIS — R519 Headache, unspecified: Secondary | ICD-10-CM | POA: Diagnosis not present

## 2024-02-01 DIAGNOSIS — K219 Gastro-esophageal reflux disease without esophagitis: Secondary | ICD-10-CM | POA: Diagnosis not present

## 2024-02-01 DIAGNOSIS — F32A Depression, unspecified: Secondary | ICD-10-CM | POA: Diagnosis present

## 2024-02-01 DIAGNOSIS — K5730 Diverticulosis of large intestine without perforation or abscess without bleeding: Secondary | ICD-10-CM | POA: Diagnosis not present

## 2024-02-01 LAB — COMPREHENSIVE METABOLIC PANEL WITH GFR
ALT: 16 U/L (ref 0–44)
AST: 23 U/L (ref 15–41)
Albumin: 3.6 g/dL (ref 3.5–5.0)
Alkaline Phosphatase: 94 U/L (ref 38–126)
Anion gap: 12 (ref 5–15)
BUN: 11 mg/dL (ref 8–23)
CO2: 19 mmol/L — ABNORMAL LOW (ref 22–32)
Calcium: 8.4 mg/dL — ABNORMAL LOW (ref 8.9–10.3)
Chloride: 93 mmol/L — ABNORMAL LOW (ref 98–111)
Creatinine, Ser: 0.64 mg/dL (ref 0.44–1.00)
GFR, Estimated: >60 mL/min (ref >60)
Glucose, Bld: 102 mg/dL — ABNORMAL HIGH (ref 70–99)
Potassium: 3.6 mmol/L (ref 3.5–5.1)
Sodium: 124 mmol/L — ABNORMAL LOW (ref 135–145)
Total Bilirubin: 0.8 mg/dL (ref 0.0–1.2)
Total Protein: 6.2 g/dL — ABNORMAL LOW (ref 6.5–8.1)

## 2024-02-01 LAB — CBC WITH DIFFERENTIAL/PLATELET
Abs Immature Granulocytes: 0.02 K/uL (ref 0.00–0.07)
Basophils Absolute: 0.1 K/uL (ref 0.0–0.1)
Basophils Relative: 1 %
Eosinophils Absolute: 0.2 K/uL (ref 0.0–0.5)
Eosinophils Relative: 3 %
HCT: 33.2 % — ABNORMAL LOW (ref 36.0–46.0)
Hemoglobin: 11 g/dL — ABNORMAL LOW (ref 12.0–15.0)
Immature Granulocytes: 0 %
Lymphocytes Relative: 17 %
Lymphs Abs: 1.5 K/uL (ref 0.7–4.0)
MCH: 30.2 pg (ref 26.0–34.0)
MCHC: 33.1 g/dL (ref 30.0–36.0)
MCV: 91.2 fL (ref 80.0–100.0)
Monocytes Absolute: 0.9 K/uL (ref 0.1–1.0)
Monocytes Relative: 11 %
Neutro Abs: 5.7 K/uL (ref 1.7–7.7)
Neutrophils Relative %: 68 %
Platelets: 337 K/uL (ref 150–400)
RBC: 3.64 MIL/uL — ABNORMAL LOW (ref 3.87–5.11)
RDW: 13.2 % (ref 11.5–15.5)
WBC: 8.4 K/uL (ref 4.0–10.5)
nRBC: 0 % (ref 0.0–0.2)

## 2024-02-01 LAB — URINALYSIS, ROUTINE W REFLEX MICROSCOPIC
Bacteria, UA: NONE SEEN
Bilirubin Urine: NEGATIVE
Glucose, UA: NEGATIVE mg/dL
Hgb urine dipstick: NEGATIVE
Ketones, ur: NEGATIVE mg/dL
Nitrite: NEGATIVE
Protein, ur: NEGATIVE mg/dL
Specific Gravity, Urine: 1.003 — ABNORMAL LOW (ref 1.005–1.030)
pH: 7 (ref 5.0–8.0)

## 2024-02-01 LAB — BASIC METABOLIC PANEL WITH GFR
Anion gap: 10 (ref 5–15)
Anion gap: 8 (ref 5–15)
BUN: <5 mg/dL — ABNORMAL LOW (ref 8–23)
BUN: <5 mg/dL — ABNORMAL LOW (ref 8–23)
CO2: 20 mmol/L — ABNORMAL LOW (ref 22–32)
CO2: 25 mmol/L (ref 22–32)
Calcium: 8.8 mg/dL — ABNORMAL LOW (ref 8.9–10.3)
Calcium: 8.6 mg/dL — ABNORMAL LOW (ref 8.9–10.3)
Chloride: 103 mmol/L (ref 98–111)
Chloride: 102 mmol/L (ref 98–111)
Creatinine, Ser: 0.54 mg/dL (ref 0.44–1.00)
Creatinine, Ser: 0.78 mg/dL (ref 0.44–1.00)
GFR, Estimated: >60 mL/min (ref >60)
GFR, Estimated: >60 mL/min (ref >60)
Glucose, Bld: 86 mg/dL (ref 70–99)
Glucose, Bld: 98 mg/dL (ref 70–99)
Potassium: 3.8 mmol/L (ref 3.5–5.1)
Potassium: 3.7 mmol/L (ref 3.5–5.1)
Sodium: 133 mmol/L — ABNORMAL LOW (ref 135–145)
Sodium: 135 mmol/L (ref 135–145)

## 2024-02-01 LAB — NA AND K (SODIUM & POTASSIUM), RAND UR
Potassium Urine: 11 mmol/L
Sodium, Ur: <30 mmol/L

## 2024-02-01 LAB — OSMOLALITY: Osmolality: 280 mosm/kg (ref 275–295)

## 2024-02-01 LAB — TROPONIN I (HIGH SENSITIVITY): Troponin I (High Sensitivity): 4 ng/L (ref <18)

## 2024-02-01 MED ORDER — ASPIRIN 81 MG PO TBEC
81.0000 mg | DELAYED_RELEASE_TABLET | Freq: Every day | ORAL | Status: DC
  Administered 2024-02-02 – 2024-02-04 (×3): 81 mg via ORAL
  Filled 2024-02-01 (×3): qty 1

## 2024-02-01 MED ORDER — BUPROPION HCL ER (XL) 300 MG PO TB24
300.0000 mg | ORAL_TABLET | Freq: Every day | ORAL | Status: DC
  Administered 2024-02-01 – 2024-02-04 (×4): 300 mg via ORAL
  Filled 2024-02-01 (×5): qty 1

## 2024-02-01 MED ORDER — SODIUM CHLORIDE 0.9 % IV SOLN: INTRAVENOUS | Status: DC

## 2024-02-01 MED ORDER — SUMATRIPTAN 20 MG/ACT NA SOLN
20.0000 mg | NASAL | Status: DC | PRN
  Administered 2024-02-01 – 2024-02-02 (×2): 20 mg via NASAL
  Filled 2024-02-01: qty 1

## 2024-02-01 MED ORDER — ENOXAPARIN SODIUM 40 MG/0.4ML IJ SOSY
40.0000 mg | PREFILLED_SYRINGE | INTRAMUSCULAR | Status: DC
  Administered 2024-02-01: 40 mg via SUBCUTANEOUS
  Filled 2024-02-01: qty 0.4

## 2024-02-01 MED ORDER — SUMATRIPTAN 20 MG/ACT NA SOLN
20.0000 mg | Freq: Once | NASAL | Status: DC
  Filled 2024-02-01: qty 1

## 2024-02-01 MED ORDER — EZETIMIBE 10 MG PO TABS
10.0000 mg | ORAL_TABLET | Freq: Every day | ORAL | Status: DC
  Administered 2024-02-01 – 2024-02-04 (×4): 10 mg via ORAL
  Filled 2024-02-01 (×4): qty 1

## 2024-02-01 MED ORDER — PROPRANOLOL HCL 10 MG PO TABS
10.0000 mg | ORAL_TABLET | Freq: Three times a day (TID) | ORAL | Status: DC
Start: 2024-02-01 — End: 2024-02-04
  Administered 2024-02-01 – 2024-02-04 (×9): 10 mg via ORAL
  Filled 2024-02-01 (×10): qty 1

## 2024-02-01 MED ORDER — TRAMADOL HCL 50 MG PO TABS: 50.0000 mg | ORAL_TABLET | Freq: Four times a day (QID) | ORAL | Status: DC | PRN

## 2024-02-01 MED ORDER — FLUTICASONE PROPIONATE 50 MCG/ACT NA SUSP: 2.0000 | Freq: Every day | NASAL | Status: DC | PRN

## 2024-02-01 MED ORDER — SODIUM CHLORIDE 0.9 % IV BOLUS
500.0000 mL | Freq: Once | INTRAVENOUS | Status: AC
  Administered 2024-02-01: 500 mL via INTRAVENOUS

## 2024-02-01 MED ORDER — PANTOPRAZOLE SODIUM 40 MG PO TBEC
40.0000 mg | DELAYED_RELEASE_TABLET | Freq: Every day | ORAL | Status: DC
  Administered 2024-02-01 – 2024-02-04 (×4): 40 mg via ORAL
  Filled 2024-02-01 (×4): qty 1

## 2024-02-01 MED ORDER — IOHEXOL 350 MG/ML SOLN
75.0000 mL | Freq: Once | INTRAVENOUS | Status: AC | PRN
  Administered 2024-02-01: 75 mL via INTRAVENOUS

## 2024-02-01 MED ORDER — LORATADINE 10 MG PO TABS
10.0000 mg | ORAL_TABLET | Freq: Every day | ORAL | Status: DC
Start: 2024-02-01 — End: 2024-02-04
  Administered 2024-02-02 – 2024-02-04 (×2): 10 mg via ORAL
  Filled 2024-02-01 (×3): qty 1

## 2024-02-01 MED ORDER — METOCLOPRAMIDE HCL 5 MG/ML IJ SOLN
5.0000 mg | Freq: Once | INTRAMUSCULAR | Status: AC
  Administered 2024-02-01: 5 mg via INTRAVENOUS
  Filled 2024-02-01: qty 2

## 2024-02-01 MED ORDER — CHOLECALCIFEROL 10 MCG (400 UNIT) PO TABS
400.0000 [IU] | ORAL_TABLET | Freq: Every day | ORAL | Status: DC
  Administered 2024-02-02 – 2024-02-04 (×3): 400 [IU] via ORAL
  Filled 2024-02-01 (×4): qty 1

## 2024-02-01 MED ORDER — SERTRALINE HCL 100 MG PO TABS
200.0000 mg | ORAL_TABLET | Freq: Every day | ORAL | Status: DC
  Administered 2024-02-01 – 2024-02-04 (×4): 200 mg via ORAL
  Filled 2024-02-01 (×4): qty 2

## 2024-02-01 MED ORDER — LATANOPROST 0.005 % OP SOLN
1.0000 [drp] | Freq: Every day | OPHTHALMIC | Status: DC
  Administered 2024-02-01 – 2024-02-03 (×3): 1 [drp] via OPHTHALMIC
  Filled 2024-02-01: qty 2.5

## 2024-02-01 MED ORDER — BACLOFEN 10 MG PO TABS: 10.0000 mg | ORAL_TABLET | Freq: Three times a day (TID) | ORAL | Status: DC | PRN

## 2024-02-01 MED ORDER — GABAPENTIN 300 MG PO CAPS: 300.0000 mg | ORAL_CAPSULE | Freq: Two times a day (BID) | ORAL | Status: DC

## 2024-02-01 NOTE — Plan of Care (Signed)

## 2024-02-01 NOTE — ED Triage Notes (Signed)
 Pt BIB [facility] from home. Pt has a been having a headache and dizziness x2 days. Pt also endorses n/v. GCS15. Hx TIA  EMS 4mg  zofran  66 P 150/84 BP 107cbg 94% RA 20 LHand

## 2024-02-01 NOTE — ED Notes (Signed)
Bladder scan 204 ml °

## 2024-02-01 NOTE — TOC Initial Note (Signed)
 Transition of Care [facility]) - Initial/Assessment Note    Patient Details  Name: Cynthia [name] MRN: [id number] Date of Birth: 01-11-1946  [facility]) CM/SW Contact:    Lauraine FORBES Saa, LCSWA Phone Number: 02/01/2024, 3:08 PM  Clinical Narrative:                  3:08 PM Per chart review, patient resides at [facility]. [facility] informed CSW that patient is from their ILF. Patient has SNF and HH history with [facility], as well. Patient has a PCP and insurance. Patient's preferred pharmacy's are [facility] and [facility]. TOC will continue to follow and be available to assist.  Expected Discharge Plan: Home/Self Care Barriers to Discharge: Continued Medical Work up   Patient Goals and CMS Choice            Expected Discharge Plan and Services   Discharge Planning Services: CM Consult   Living arrangements for the past 2 months: Independent Living Facility                                      Prior Living Arrangements/Services Living arrangements for the past 2 months: Independent Living Facility Lives with:: Facility Resident Patient language and need for interpreter reviewed:: Yes        Need for Family Participation in Patient Care: No (Comment)     Criminal Activity/Legal Involvement Pertinent to Current Situation/Hospitalization: No - Comment as needed  Activities of Daily Living   ADL Screening (condition at time of admission) Independently performs ADLs?: Yes (appropriate for developmental age) Is the patient deaf or have difficulty hearing?: Yes Does the patient have difficulty seeing, even when wearing glasses/contacts?: Yes Does the patient have difficulty concentrating, remembering, or making decisions?: No  Permission Sought/Granted Permission sought to share information with : Facility Medical sales representative, Family Supports Permission granted to share information with : No (Contact  information on chart)  Share Information with NAME: Caprice Alice  Permission granted to share info w AGENCY: [facility]  Permission granted to share info w Relationship: Friend  Permission granted to share info w Contact Information: [phone]  Emotional Assessment       Orientation: : Oriented to Self, Oriented to Place, Oriented to  Time, Oriented to Situation Alcohol / Substance Use: Not Applicable Psych Involvement: No (comment)  Admission diagnosis:  Hyponatremia [E87.1] Patient Active Problem List   Diagnosis Date Noted   Hyponatremia 02/01/2024   Sacroiliac joint dysfunction of both sides 08/23/2021   Vaginal discharge 03/28/2021   Stress reaction of bone 11/09/2020   Bone marrow edema 04/12/2020   OA (osteoarthritis) of knee 03/04/2020   Psoriasis 12/19/2019   TIA (transient ischemic attack) 09/14/2019   Insomnia 05/19/2019   Gastroesophageal reflux disease without esophagitis 05/02/2019   Essential tremor 07/25/2017   Lichen 07/24/2017   PCP NOTES >>>>>>>>>>>>>>>>>>>>>>>>>>>>>>>>>>> 03/25/2015   H/O adenomatous polyp of colon 05/18/2014   Annual physical exam 03/13/2011   Osteoporosis    Osteoarthritis 06/02/2010   Hyperlipidemia 11/09/2009   Migraine headache 06/28/2009   CONSTIPATION, CHRONIC 02/01/2007   Anxiety and depression 08/17/2006   Allergic rhinitis 08/17/2006   PCP:  Amon Aloysius FORBES, MD Pharmacy:   [facility] [place], [place] - [address] [place] [address] Phone: [phone] Fax: [phone]  [place] [facility]armacy [address] [place] [address] Phone: [phone] Fax: [phone]     Social Drivers of Health (SDOH) Social History: SDOH Screenings   Food Insecurity: No Food Insecurity (02/01/2024)  Housing: Low Risk  (02/01/2024)  Transportation Needs: No Transportation Needs (02/01/2024)  Utilities: Not At Risk (02/01/2024)  Alcohol Screen: Low Risk  (08/14/2023)   Depression (PHQ2-9): Low Risk  (08/14/2023)  Financial Resource Strain: Low Risk  (08/14/2023)  Physical Activity: Insufficiently Active (08/14/2023)  Social Connections: Moderately Integrated (02/01/2024)  Stress: No Stress Concern Present (08/14/2023)  Tobacco Use: Low Risk  (02/01/2024)  Health Literacy: Adequate Health Literacy (08/14/2023)   SDOH Interventions:     Readmission Risk Interventions     No data to display

## 2024-02-01 NOTE — ED Provider Notes (Signed)
 [place] EMERGENCY DEPARTMENT AT [facility] Provider Note   CSN: [id number] Arrival date & time: 02/01/24  0154     History Chief Complaint  Patient presents with   Dizziness   Headache   Nausea    HPI Cynthia [name] is a 78 y.o. female presenting for fatigue nausea and headache. States that she has had a headache and poor PO intake for the past few days. NO PO since Wednesday other than 1 bite of a bagel Not drinking much fluid either  Patient's recorded medical, surgical, social, medication list and allergies were reviewed in the Snapshot window as part of the initial history.   Review of Systems   Review of Systems  Constitutional:  Positive for fatigue. Negative for chills and fever.  HENT:  Negative for ear pain and sore throat.   Eyes:  Negative for pain and visual disturbance.  Respiratory:  Negative for cough and shortness of breath.   Cardiovascular:  Negative for chest pain and palpitations.  Gastrointestinal:  Negative for abdominal pain and vomiting.  Genitourinary:  Negative for dysuria and hematuria.  Musculoskeletal:  Negative for arthralgias and back pain.  Skin:  Negative for color change and rash.  Neurological:  Positive for headaches. Negative for seizures and syncope.  All other systems reviewed and are negative.   Physical Exam Updated Vital Signs BP (!) 152/70   Pulse 74   Temp 98 F (36.7 C) (Oral)   Resp 18   Ht 5' 4 (1.626 m)   Wt 82.1 kg   SpO2 98%   BMI 31.07 kg/m  Physical Exam Vitals and nursing note reviewed.  Constitutional:      General: She is not in acute distress.    Appearance: She is well-developed.  HENT:     Head: Normocephalic and atraumatic.  Eyes:     Conjunctiva/sclera: Conjunctivae normal.  Cardiovascular:     Rate and Rhythm: Normal rate and regular rhythm.     Heart sounds: No murmur heard. Pulmonary:     Effort: Pulmonary effort is normal. No respiratory distress.     Breath sounds: Normal  breath sounds.  Abdominal:     General: There is no distension.     Palpations: Abdomen is soft.     Tenderness: There is no abdominal tenderness. There is no right CVA tenderness or left CVA tenderness.  Musculoskeletal:        General: No swelling or tenderness. Normal range of motion.     Cervical back: Neck supple.  Skin:    General: Skin is warm and dry.  Neurological:     General: No focal deficit present.     Mental Status: She is alert and oriented to person, place, and time. Mental status is at baseline.     Cranial Nerves: No cranial nerve deficit.      ED Course/ Medical Decision Making/ A&P    Procedures Procedures   Medications Ordered in ED Medications  0.9 %  sodium chloride  infusion ( Intravenous Rate/Dose Change 02/01/24 1359)  enoxaparin  (LOVENOX ) injection 40 mg (40 mg Subcutaneous Given 02/01/24 1410)  aspirin  EC tablet 81 mg (81 mg Oral Patient Refused/Not Given 02/01/24 1400)  buPROPion  (WELLBUTRIN  XL) 24 hr tablet 300 mg (300 mg Oral Given 02/01/24 1447)  loratadine  (CLARITIN ) tablet 10 mg (10 mg Oral Patient Refused/Not Given 02/01/24 1401)  cholecalciferol  (VITAMIN D3) 10 MCG (400 UNIT) tablet 400 Units (400 Units Oral Patient Refused/Not Given 02/01/24 1400)  ezetimibe  (ZETIA )  tablet 10 mg (10 mg Oral Given 02/01/24 1412)  fluticasone  (FLONASE ) 50 MCG/ACT nasal spray 2 spray (has no administration in time range)  propranolol  (INDERAL ) tablet 10 mg (10 mg Oral Given 02/01/24 1743)  sertraline  (ZOLOFT ) tablet 200 mg (200 mg Oral Given 02/01/24 1410)  traMADol  (ULTRAM ) tablet 50 mg (has no administration in time range)  pantoprazole  (PROTONIX ) EC tablet 40 mg (40 mg Oral Given 02/01/24 1411)  latanoprost  (XALATAN ) 0.005 % ophthalmic solution 1 drop (has no administration in time range)  SUMAtriptan  (IMITREX ) nasal spray 20 mg (20 mg Nasal Patient Refused/Not Given 02/01/24 1402)  SUMAtriptan  (IMITREX ) nasal spray 20 mg (20 mg Nasal Given 02/01/24 1803)  sodium  chloride 0.9 % bolus 500 mL (0 mLs Intravenous Stopped 02/01/24 0544)  metoCLOPramide  (REGLAN ) injection 5 mg (5 mg Intravenous Given 02/01/24 0412)  iohexol  (OMNIPAQUE ) 350 MG/ML injection 75 mL (75 mLs Intravenous Contrast Given 02/01/24 9386)  Medical Decision Making:   Cynthia [name] is a 78 y.o. female who presented to the ED today with altered mental status detailed above.    Patient placed on continuous vitals and telemetry monitoring while in ED which was reviewed periodically.  Complete initial physical exam performed, notably the patient  was HDS in .    Reviewed and confirmed nursing documentation for past medical history, family history, social history.    Initial Assessment:   With the patient's presentation of altered mental status, most likely diagnosis is delerium 2/2 infectious etiology (UTI/CAP/URI) vs metabolic abnormality (Na/K/Mg/Ca) vs nonspecific etiology. Other diagnoses were considered including (but not limited to) CVA, ICH, intracranial mass, critical dehydration, heptatic dysfunction, uremia, hypercarbia, intoxication, endrocrine abnormality, toxidrome. These are considered less likely due to history of present illness and physical exam findings.   This is most consistent with an acute life/limb threatening illness complicated by underlying chronic conditions.  Initial Plan:  CTH to evaluate for intracranial etiology of patient's symptoms  Screening labs including CBC and Metabolic panel to evaluate for infectious or metabolic etiology of disease.  Urinalysis with reflex culture ordered to evaluate for UTI or relevant urologic/nephrologic pathology.  CTAP to evaluate for infectious or obstructive intraabdominal etiology for her symptoms \\EKG  to evaluate for cardiac pathology Objective evaluation as below reviewed   Initial Study Results:   Laboratory  All laboratory results reviewed without evidence of clinically relevant pathology.    EKG EKG was reviewed  independently. Rate, rhythm, axis, intervals all examined and without medically relevant abnormality. ST segments without concerns for elevations.    Radiology:  All images reviewed independently. Agree with radiology report at this time.   CT ABDOMEN PELVIS W CONTRAST Result Date: 02/01/2024 CLINICAL DATA:  78 year old female with acute abdominal pain. EXAM: CT ABDOMEN AND PELVIS WITH CONTRAST TECHNIQUE: Multidetector CT imaging of the abdomen and pelvis was performed using the standard protocol following bolus administration of intravenous contrast. RADIATION DOSE REDUCTION: This exam was performed according to the departmental dose-optimization program which includes automated exposure control, adjustment of the mA and/or kV according to patient size and/or use of iterative reconstruction technique. CONTRAST:  75mL OMNIPAQUE  IOHEXOL  350 MG/ML SOLN COMPARISON:  CT Abdomen and Pelvis 10/25/2010. FINDINGS: Lower chest: Borderline to mild cardiomegaly now. No pericardial or pleural effusion. Platelike right lower lobe atelectasis or scarring, and mild postinflammatory appearing tree-in-bud nodularity in the lateral basal segment of that lobe (no follow-up imaging recommended). Hepatobiliary: Negative liver and gallbladder. Pancreas: Negative. Spleen: Negative. Adrenals/Urinary Tract: Normal adrenal glands. Nonobstructed kidneys with symmetric renal  enhancement and contrast excretion. The ureters are both prominent, but symmetric, and there is excreted IV contrast into the distal ureters and into the urinary bladder on the delayed images. The bladder is distended, volume estimated at 680 mL. Punctate pelvic phleboliths, but no urinary calculus identified. No bladder wall thickening or perivesical inflammation. Stomach/Bowel: Nondilated large and small bowel loops. Mild sigmoid diverticulosis without active inflammation. Mild to moderate large bowel retained stool including in the transverse colon. Appendix is  within normal limits on series 3, image 54. No large bowel inflammation. Small fat containing supraumbilical hernia. No herniated bowel or incarceration. Small phrenic ampulla versus hiatal hernia. Otherwise negative stomach and duodenum. No pneumoperitoneum, free fluid, mesenteric inflammation. Vascular/Lymphatic: Aortoiliac calcified atherosclerosis. Mild arterial tortuosity. Negative for abdominal aortic aneurysm. Portal venous system is patent. No lymphadenopathy. Reproductive: Negative. Other: No pelvis free fluid. Musculoskeletal: Progressed lumbar spine degeneration since 2012. Bulky and partially calcified left lateral recess disc extrusion at T12-L1 (series 7, image 72) contributes also to severe left foraminal stenosis at that level. Multilevel vacuum disc there and elsewhere. No acute osseous abnormality identified. IMPRESSION: 1. Distended urinary bladder (680 mL) suggesting urinary retention. No urinary calculus or obstructive uropathy. 2. No other acute or inflammatory process identified in the abdomen or pelvis. 3.  Aortic Atherosclerosis (ICD10-I70.0). 4. 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 Electronically Signed   By: VEAR Hurst M.D.   On: 02/01/2024 06:35   CT HEAD WO CONTRAST ( ) Result Date: 02/01/2024 CLINICAL DATA:  Headache, increasing frequency or severity EXAM: CT HEAD WITHOUT CONTRAST TECHNIQUE: Contiguous axial images were obtained from the base of the skull through the vertex without intravenous contrast. RADIATION DOSE REDUCTION: This exam was performed according to the departmental dose-optimization program which includes automated exposure control, adjustment of the mA and/or kV according to patient size and/or use of iterative reconstruction technique. COMPARISON:  MRI 12/30/2023. FINDINGS: Brain: No evidence of acute infarction, hemorrhage, hydrocephalus, extra-axial collection or mass lesion/mass effect. Vascular: No hyperdense  vessel. Skull: No acute fracture. Sinuses/Orbits: Left sphenoid and posterior left ethmoid air cell mucosal thickening. No acute orbital findings. Other: No mastoid effusions. IMPRESSION: No evidence of acute intracranial abnormality. Electronically Signed   By: Gilmore GORMAN Molt M.D.   On: 02/01/2024 02:35   Final Assessment and Plan:   Patients labwors shows hyponatremia. Patient able to void after CT and PVR pending. Patient with no clear etiology for her hyponatremia. Will gently rehydrate suspecting some volume depletion and admit to medicine for further diagnostic workup. Disposition:   Based on the above findings, I believe this patient is stable for admission.    Patient/family educated about specific findings on our evaluation and explained exact reasons for admission.  Patient/family educated about clinical situation and time was allowed to answer questions.   Admission team communicated with and agreed with need for admission. Patient admitted. Patient  ready to move at this time.     Emergency Department Medication Summary:   Medications  0.9 %  sodium chloride  infusion ( Intravenous Rate/Dose Change 02/01/24 1359)  enoxaparin  (LOVENOX ) injection 40 mg (40 mg Subcutaneous Given 02/01/24 1410)  aspirin  EC tablet 81 mg (81 mg Oral Patient Refused/Not Given 02/01/24 1400)  buPROPion  (WELLBUTRIN  XL) 24 hr tablet 300 mg (300 mg Oral Given 02/01/24 1447)  loratadine  (CLARITIN ) tablet 10 mg (10 mg Oral Patient Refused/Not Given 02/01/24 1401)  cholecalciferol  (VITAMIN D3) 10 MCG (400 UNIT) tablet 400 Units (400 Units Oral Patient Refused/Not  Given 02/01/24 1400)  ezetimibe  (ZETIA ) tablet 10 mg (10 mg Oral Given 02/01/24 1412)  fluticasone  (FLONASE ) 50 MCG/ACT nasal spray 2 spray (has no administration in time range)  propranolol  (INDERAL ) tablet 10 mg (10 mg Oral Given 02/01/24 1743)  sertraline  (ZOLOFT ) tablet 200 mg (200 mg Oral Given 02/01/24 1410)  traMADol  (ULTRAM ) tablet 50 mg (has no  administration in time range)  pantoprazole  (PROTONIX ) EC tablet 40 mg (40 mg Oral Given 02/01/24 1411)  latanoprost  (XALATAN ) 0.005 % ophthalmic solution 1 drop (has no administration in time range)  SUMAtriptan  (IMITREX ) nasal spray 20 mg (20 mg Nasal Patient Refused/Not Given 02/01/24 1402)  SUMAtriptan  (IMITREX ) nasal spray 20 mg (20 mg Nasal Given 02/01/24 1803)  sodium chloride  0.9 % bolus 500 mL (0 mLs Intravenous Stopped 02/01/24 0544)  metoCLOPramide  (REGLAN ) injection 5 mg (5 mg Intravenous Given 02/01/24 0412)  iohexol  (OMNIPAQUE ) 350 MG/ML injection 75 mL (75 mLs Intravenous Contrast Given 02/01/24 0613)          Clinical Impression:  1. Hyponatremia      Admit   Final Clinical Impression(s) / ED Diagnoses Final diagnoses:  Hyponatremia    Rx / DC Orders ED Discharge Orders     None         Jerral Meth, MD 02/01/24 [phone]

## 2024-02-01 NOTE — H&P (Signed)
 History and Physical    Patient: Cynthia [name] FMW:[id number] DOB: 03-May-1946 DOA: 02/01/2024 DOS: the patient was seen and examined on 02/01/2024 PCP: Amon Aloysius BRAVO, MD  Patient coming from: Home  Chief Complaint:  Chief Complaint  Patient presents with   Dizziness   Headache   Nausea   HPI: Cynthia [name] is a 78 y.o. female with medical history significant of migraines, R eye glaucoma, PUD, RLS, TIA, GERD, and depression p/w hyponatremia iso migraine.  The patient reported experiencing a migraine beginning the day before yesterday around lunchtime. The headache worsened over time. Despite taking medication (tylenol  tramadol , and , the patient had difficulty sleeping and needed to take an additional sleeping pill to fall asleep. The headache persisted into the next day and worsened; as such, she presented to the ED for evaluation. Currently, the headache persists but is not as severe as initially. The patient reported having experienced similar headaches in the past, which usually improved after having a cup of coffee, breakfast, and becoming more active.   In the ED, pt AFVSS. Labs notable for Na 124. CT abd w/ distended bladder and CT head w/ NAICA. EDP requested admission for migraine and hyponatremia.   Review of Systems: As mentioned in the history of present illness. All other systems reviewed and are negative. Past Medical History:  Diagnosis Date   Allergy    Anemia    Anxiety    Arthritis    Blood transfusion without reported diagnosis    Cataract    Constipation, chronic    DEGENERATIVE JOINT DISEASE 06/02/2010   Depression    GERD (gastroesophageal reflux disease)    Glaucoma 05/15/2012   Right eye   Headache(784.0)    Hyperlipidemia    hx of   Osteopenia    Osteoporosis    Psoriasis    PUD (peptic ulcer disease) 05/15/2010   Upper GI bleed d/t NSAIDs   RLS (restless legs syndrome)    robaxin  prn   TIA (transient ischemic attack) 09/2019   Past Surgical  History:  Procedure Laterality Date   ANKLE FRACTURE SURGERY Left    surgery 12-11 (LEFT)   CESAREAN SECTION  05/16/1967   COLONOSCOPY  12/17/2013   EYE SURGERY  2020   cataract removal   FRACTURE SURGERY  2012   fracture in foot   JOINT REPLACEMENT  August, 2023   Left knee   TOTAL KNEE ARTHROPLASTY Left 12/28/2021   UPPER GASTROINTESTINAL ENDOSCOPY     Social History:  reports that she has never smoked. She has never been exposed to tobacco smoke. She has never used smokeless tobacco. She reports current alcohol use. She reports that she does not use drugs.  Allergies  Allergen Reactions   Bee Pollen Other (See Comments)   Motrin  [Ibuprofen ]     GI bleed   Pollen Extract    Statins     delibitating pain all over body   Sulfonamide Derivatives     Pt unsure of reaction   Ciprofloxacin Swelling and Rash    Family History  Problem Relation Age of Onset   Aneurysm Mother        brain   Celiac disease Brother    Diabetes Maternal Grandfather    Hypertension Neg Hx    Breast cancer Neg Hx    Coronary artery disease Neg Hx    Colon cancer Neg Hx    Esophageal cancer Neg Hx    Stomach cancer Neg Hx  Rectal cancer Neg Hx    Colon polyps Neg Hx     Prior to Admission medications   Medication Sig Start Date End Date Taking? Authorizing Provider  aspirin  EC 81 MG tablet Take 81 mg by mouth daily. Swallow whole.    [provider]  baclofen  (LIORESAL ) 10 MG tablet Take 1 tablet (10 mg total) by mouth 3 (three) times daily as needed for muscle spasms. 12/10/23   Skeet Juliene SAUNDERS, DO  buPROPion  (WELLBUTRIN  XL) 300 MG 24 hr tablet Take 1 tablet (300 mg total) by mouth daily. 09/12/23   Paz, Jose E, MD  cetirizine (ZYRTEC) 10 MG tablet Take 10 mg by mouth at bedtime as needed for allergies.    [provider]  cholecalciferol  (VITAMIN D3) 10 MCG (400 UNIT) TABS tablet Take by mouth. 01/27/13   [provider]  ezetimibe  (ZETIA ) 10 MG tablet Take 1 tablet  (10 mg total) by mouth daily. 09/12/23   Paz, Jose E, MD  fluticasone  (FLONASE ) 50 MCG/ACT nasal spray Place 2 sprays into both nostrils daily. 05/02/23   Almarie Waddell NOVAK, NP  gabapentin  (NEURONTIN ) 300 MG capsule Take 1 capsule (300 mg total) by mouth 2 (two) times daily. 12/10/23   Skeet, Adam R, DO  ketoconazole  (NIZORAL ) 2 % cream Apply 1 Application topically daily. 07/19/22   Paz, Jose E, MD  Multiple Vitamins-Minerals (ICAPS AREDS 2 PO) Take by mouth.    [provider]  nystatin  (MYCOSTATIN ) 100000 UNIT/ML suspension Take 5 mLs (500,000 Units total) by mouth 4 (four) times daily. 07/13/22   Jason Leita Repine, FNP  omeprazole  (PRILOSEC) 40 MG capsule TAKE 1 CAPSULE BY MOUTH EVERY DAY 10/01/23   Dorsey, Ying C, MD  promethazine -dextromethorphan (PROMETHAZINE -DM) 6.25-15 MG/5ML syrup Take 5 mLs by mouth 4 (four) times daily as needed for cough. 06/10/23   Acevedo, Angela, PA  propranolol  (INDERAL ) 10 MG tablet Take 1 tablet (10 mg total) by mouth 3 (three) times daily. 08/22/23   Amon Aloysius BRAVO, MD  sertraline  (ZOLOFT ) 100 MG tablet Take 2 tablets (200 mg total) by mouth daily. 01/23/24   Amon Aloysius BRAVO, MD  traMADol  (ULTRAM ) 50 MG tablet Take 1 tablet (50 mg total) by mouth daily as needed. 07/24/23   Paz, Jose E, MD  Travoprost, BAK Free, (TRAVATAN) 0.004 % SOLN ophthalmic solution Place 1 drop into the right eye at bedtime.    [provider]  zolpidem  (AMBIEN ) 5 MG tablet TAKE 1 TABLET (5 MG TOTAL) BY MOUTH AT BEDTIME AS NEEDED FOR SLEEP. 11/28/23   Amon Aloysius BRAVO, MD    Physical Exam: Vitals:   02/01/24 0332 02/01/24 0430 02/01/24 0544 02/01/24 0545  BP: 129/69 (!) 136/120  (!) 163/79  Pulse: 62 65  75  Resp: 15 15  17   Temp:   97.9 F (36.6 C)   TempSrc:   Oral   SpO2: 97% 99%  100%  Weight:      Height:       General: Alert, oriented x3, resting comfortably in no acute distress Respiratory: Lungs clear to auscultation bilaterally with normal respiratory effort; no  w/r/r Cardiovascular: Regular rate and rhythm w/o m/r/g   Data Reviewed:  Lab Results  Component Value Date   WBC 8.4 02/01/2024   HGB 11.0 (L) 02/01/2024   HCT 33.2 (L) 02/01/2024   MCV 91.2 02/01/2024   PLT 337 02/01/2024   Lab Results  Component Value Date   GLUCOSE 86 02/01/2024   CALCIUM  8.8 (L)  02/01/2024   NA 133 (L) 02/01/2024   K 3.8 02/01/2024   CO2 20 (L) 02/01/2024   CL 103 02/01/2024   BUN <5 (L) 02/01/2024   CREATININE 0.54 02/01/2024   Lab Results  Component Value Date   ALT 16 02/01/2024   AST 23 02/01/2024   ALKPHOS 94 02/01/2024   BILITOT 0.8 02/01/2024   Lab Results  Component Value Date   INR 0.9 09/14/2019   INR 1.05 10/24/2010   Radiology: CT ABDOMEN PELVIS W CONTRAST Result Date: 02/01/2024 CLINICAL DATA:  78 year old female with acute abdominal pain. EXAM: CT ABDOMEN AND PELVIS WITH CONTRAST TECHNIQUE: Multidetector CT imaging of the abdomen and pelvis was performed using the standard protocol following bolus administration of intravenous contrast. RADIATION DOSE REDUCTION: This exam was performed according to the departmental dose-optimization program which includes automated exposure control, adjustment of the mA and/or kV according to patient size and/or use of iterative reconstruction technique. CONTRAST:  75mL OMNIPAQUE  IOHEXOL  350 MG/ML SOLN COMPARISON:  CT Abdomen and Pelvis 10/25/2010. FINDINGS: Lower chest: Borderline to mild cardiomegaly now. No pericardial or pleural effusion. Platelike right lower lobe atelectasis or scarring, and mild postinflammatory appearing tree-in-bud nodularity in the lateral basal segment of that lobe (no follow-up imaging recommended). Hepatobiliary: Negative liver and gallbladder. Pancreas: Negative. Spleen: Negative. Adrenals/Urinary Tract: Normal adrenal glands. Nonobstructed kidneys with symmetric renal enhancement and contrast excretion. The ureters are both prominent, but symmetric, and there is excreted IV  contrast into the distal ureters and into the urinary bladder on the delayed images. The bladder is distended, volume estimated at 680 mL. Punctate pelvic phleboliths, but no urinary calculus identified. No bladder wall thickening or perivesical inflammation. Stomach/Bowel: Nondilated large and small bowel loops. Mild sigmoid diverticulosis without active inflammation. Mild to moderate large bowel retained stool including in the transverse colon. Appendix is within normal limits on series 3, image 54. No large bowel inflammation. Small fat containing supraumbilical hernia. No herniated bowel or incarceration. Small phrenic ampulla versus hiatal hernia. Otherwise negative stomach and duodenum. No pneumoperitoneum, free fluid, mesenteric inflammation. Vascular/Lymphatic: Aortoiliac calcified atherosclerosis. Mild arterial tortuosity. Negative for abdominal aortic aneurysm. Portal venous system is patent. No lymphadenopathy. Reproductive: Negative. Other: No pelvis free fluid. Musculoskeletal: Progressed lumbar spine degeneration since 2012. Bulky and partially calcified left lateral recess disc extrusion at T12-L1 (series 7, image 72) contributes also to severe left foraminal stenosis at that level. Multilevel vacuum disc there and elsewhere. No acute osseous abnormality identified. IMPRESSION: 1. Distended urinary bladder (680 mL) suggesting urinary retention. No urinary calculus or obstructive uropathy. 2. No other acute or inflammatory process identified in the abdomen or pelvis. 3.  Aortic Atherosclerosis (ICD10-I70.0). 4. 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 Electronically Signed   By: VEAR Hurst M.D.   On: 02/01/2024 06:35   CT HEAD WO CONTRAST ( ) Result Date: 02/01/2024 CLINICAL DATA:  Headache, increasing frequency or severity EXAM: CT HEAD WITHOUT CONTRAST TECHNIQUE: Contiguous axial images were obtained from the base of the skull through the vertex  without intravenous contrast. RADIATION DOSE REDUCTION: This exam was performed according to the departmental dose-optimization program which includes automated exposure control, adjustment of the mA and/or kV according to patient size and/or use of iterative reconstruction technique. COMPARISON:  MRI 12/30/2023. FINDINGS: Brain: No evidence of acute infarction, hemorrhage, hydrocephalus, extra-axial collection or mass lesion/mass effect. Vascular: No hyperdense vessel. Skull: No acute fracture. Sinuses/Orbits: Left sphenoid and posterior left ethmoid air cell mucosal thickening. No acute  orbital findings. Other: No mastoid effusions. IMPRESSION: No evidence of acute intracranial abnormality. Electronically Signed   By: Gilmore GORMAN Molt M.D.   On: 02/01/2024 02:35    Assessment and Plan: 73F h/o migraines, R eye glaucoma, PUD, RLS, ?TIA in 2021, GERD, and depression p/w hyponatremia iso migraine.  Hyponatremia Presumed hypovolemic hyponatremia iso poor PO intake given reported migraine -Current Na 124; goal Na increase <42mEq/d to prevent ODS -Start IVF repletion w/ NS at 50cc/h for now -BMP q6h for now -For overcorrection, administer IV desmopressin 2mcg q8h prn -If Na fails to improve/normalize w/in 24-48h, consider renal consult  Migraine Pt follows with Skeet and was last seen in 12/2019 and advised to take verapamil  CR 180mg   for prevention and tramadol  prn -Continue pta propanolol 10mg  TID (pt reports only taking BID pta); consider switching back to verapamil  for ease of dosing -PTA tramadol  prn for abortive therapy -Sumatriptan  x1 followed by prn; consider prescribing true abortive medication on discharge as pt reports taking on tylenol  and tramadol  -If no relief with above, may consider IP Neurology consult  R eye glaucoma -PTA Xalatan  1 drop R eye nightly in place of pta Travatan  HLD -PTA Zetia   GERD -PTA PPI  Depression -PTA wellbutrin , and sertraline    Advance Care  Planning:   Code Status: Prior   Consults: N/A  Family Communication: N/A  Severity of Illness: The appropriate patient status for this patient is INPATIENT. Inpatient status is judged to be reasonable and necessary in order to provide the required intensity of service to ensure the patient's safety. The patient's presenting symptoms, physical exam findings, and initial radiographic and laboratory data in the context of their chronic comorbidities is felt to place them at high risk for further clinical deterioration. Furthermore, it is not anticipated that the patient will be medically stable for discharge from the hospital within 2 midnights of admission.   * I certify that at the point of admission it is my clinical judgment that the patient will require inpatient hospital care spanning beyond 2 midnights from the point of admission due to high intensity of service, high risk for further deterioration and high frequency of surveillance required.*   ------- I spent 55 minutes reviewing previous notes, at the bedside counseling/discussing the treatment plan, and performing clinical documentation.  Author: Marsha Ada, MD 02/01/2024 7:29 AM  For on call review [email or web address].

## 2024-02-02 DIAGNOSIS — E871 Hypo-osmolality and hyponatremia: Principal | ICD-10-CM

## 2024-02-02 LAB — BASIC METABOLIC PANEL WITH GFR
Anion gap: 10 (ref 5–15)
BUN: 5 mg/dL — ABNORMAL LOW (ref 8–23)
CO2: 24 mmol/L (ref 22–32)
Calcium: 8.4 mg/dL — ABNORMAL LOW (ref 8.9–10.3)
Chloride: 104 mmol/L (ref 98–111)
Creatinine, Ser: 0.65 mg/dL (ref 0.44–1.00)
GFR, Estimated: >60 mL/min (ref >60)
Glucose, Bld: 92 mg/dL (ref 70–99)
Potassium: 4 mmol/L (ref 3.5–5.1)
Sodium: 138 mmol/L (ref 135–145)

## 2024-02-02 LAB — CBC
HCT: 35.7 % — ABNORMAL LOW (ref 36.0–46.0)
Hemoglobin: 11.5 g/dL — ABNORMAL LOW (ref 12.0–15.0)
MCH: 29.1 pg (ref 26.0–34.0)
MCHC: 32.2 g/dL (ref 30.0–36.0)
MCV: 90.4 fL (ref 80.0–100.0)
Platelets: 349 K/uL (ref 150–400)
RBC: 3.95 MIL/uL (ref 3.87–5.11)
RDW: 13.9 % (ref 11.5–15.5)
WBC: 6.2 K/uL (ref 4.0–10.5)
nRBC: 0 % (ref 0.0–0.2)

## 2024-02-02 NOTE — Plan of Care (Signed)
  Problem: Pain Managment: Goal: General experience of comfort will improve and/or be controlled Outcome: Progressing   Problem: Safety: Goal: Ability to remain free from injury will improve Outcome: Progressing   Problem: Skin Integrity: Goal: Risk for impaired skin integrity will decrease Outcome: Progressing   Problem: Clinical Measurements: Goal: Will remain free from infection Outcome: Progressing

## 2024-02-02 NOTE — Progress Notes (Signed)
 Patient IV fluids stopped per doctor, foley discontinued per doctor. Patient will need to void by 4 PM

## 2024-02-02 NOTE — Plan of Care (Signed)

## 2024-02-02 NOTE — Progress Notes (Signed)
 PROGRESS NOTE  GIRL SCHISSLER  FMW:[id number] DOB: Apr 26, 1946 DOA: 02/01/2024 PCP: Amon Aloysius BRAVO, MD  Consultants  Brief Narrative: 78 y.o. female with medical history significant of migraines, R eye glaucoma, PUD, RLS, TIA, GERD, and depression p/w hyponatremia iso migraine. The patient reported experiencing a migraine beginning the day prior to admission.. The headache worsened over time. Despite taking medication (tylenol  tramadol , and , the patient had difficulty sleeping and needed to take an additional sleeping pill to fall asleep.  Also with recurrent vomiting that started night before admission.  The headache persisted into the next day and worsened; as such, she presented to the ED for evaluation.  While in the emergency department, labs notable for Na 124. CT abd w/ distended bladder and CT head w/ NAICA. EDP requested admission for migraine and hyponatremia.    Assessment & Plan: Hyponatremia Presumed hypovolemic hyponatremia iso poor PO intake given reported migraine and vomiting. -Current Na 124; goal Na increase <72mEq/d to prevent ODS - She has corrected to 138 this morning, about 42 hours after her initial low sodium.  She is now completely asymptomatic. - At this point plan will be to stop her IV normal saline and continue to watch her sodium.  As she remains completely asymptomatic and because enough time is passed, no need for IV desmopressin for overcorrection at this time but will continue to watch her sodium.   Migraine - Pt follows with Dr. Skeet and was last seen in 12/2019 and advised to take verapamil  CR 180mg   for prevention and tramadol  prn -Continue pta propanolol 10mg  TID (pt reports only taking BID pta); consider switching back to verapamil  for ease of dosing -Sumatriptan  x1 given in ED and she reported this resolved her migraine. - Reports that she was previously on sumatriptan  years ago but has not been on this recently.  Plan will be to start this as needed in  hospital and at discharge as it was so helpful for her and she has been off it for quite some time  Bladder distention/retention -Foley placed in the ER. - Will DC of Foley today and voiding trial. - Consult urology if continues with urinary retention after DC Foley.   R eye glaucoma -PTA Xalatan  1 drop R eye nightly in place of pta Travatan   HLD -PTA Zetia    GERD -PTA PPI   Depression -PTA wellbutrin , and sertraline    DVT prophylaxis:  enoxaparin  (LOVENOX ) injection 40 mg Start: 02/01/24 1400  Code Status:   Code Status: Full Code Level of care: Med-Surg Status is: Inpatient Dispo: Remain in house today and watch sodium.  If sodium remains good and no further urinary retention likely able to DC tomorrow 9/21.   Subjective: Patient feels much better this morning.  No further headaches.  Eating and drinking well this morning.  Imitrex  helped with her pain relief in the ER yesterday.  No further nausea or vomiting since admission.  Objective: Vitals:   02/01/24 1925 02/02/24 0342 02/02/24 0747 02/02/24 0940  BP: (!) 144/66 133/64 139/63 139/63  Pulse: 75 68 61 61  Resp: 16 16 18    Temp: 97.9 F (36.6 C) 98.1 F (36.7 C) 98.2 F (36.8 C)   TempSrc: Oral Oral Oral   SpO2: 98% 98% 95%   Weight:      Height:        Intake/Output Summary (Last 24 hours) at 02/02/2024 1017 Last data filed at 02/02/2024 0900 Gross per 24 hour  Intake 740 ml  Output 3300 ml  Net -2560 ml   Filed Weights   02/01/24 0158  Weight: 82.1 kg   Body mass index is 31.07 kg/m.  Gen: 78 y.o. female in no apparent distress.  Nontoxic, pleasant Pulm: Non-labored breathing.  Clear to auscultation bilaterally.  CV: Regular rate and rhythm. No murmur, rub, or gallop. No JVD GI: Abdomen soft, non-tender, non-distended Ext: Warm, no deformities, no pedal edema Skin: No rashes, lesions no ulcers Neuro: Alert and oriented x 4. No focal neurological deficits. Psych: Calm  Judgement and insight  appear normal. Mood & affect appropriate.     I have personally reviewed the following labs and images: CBC: Recent Labs  Lab 02/01/24 0208 02/02/24 0804  WBC 8.4 6.2  NEUTROABS 5.7  --   HGB 11.0* 11.5*  HCT 33.2* 35.7*  MCV 91.2 90.4  PLT 337 349   BMP &GFR Recent Labs  Lab 02/01/24 0208 02/01/24 0800 02/01/24 1941 02/02/24 0743  NA 124* 133* 135 138  K 3.6 3.8 3.7 4.0  CL 93* 103 102 104  CO2 19* 20* 25 24  GLUCOSE 102* 86 98 92  BUN 11 <5* <5* 5*  CREATININE 0.64 0.54 0.78 0.65  CALCIUM  8.4* 8.8* 8.6* 8.4*   Estimated Creatinine Clearance: 59.1 mL/min (by C-G formula based on SCr of 0.65 mg/dL). Liver & Pancreas: Recent Labs  Lab 02/01/24 0208  AST 23  ALT 16  ALKPHOS 94  BILITOT 0.8  PROT 6.2*  ALBUMIN 3.6   No results for input(s): LIPASE, AMYLASE in the last 168 hours. No results for input(s): AMMONIA in the last 168 hours. Diabetic: No results for input(s): HGBA1C in the last 72 hours. No results for input(s): GLUCAP in the last 168 hours. Cardiac Enzymes: No results for input(s): CKTOTAL, CKMB, CKMBINDEX, TROPONINI in the last 168 hours. No results for input(s): PROBNP in the last 8760 hours. Coagulation Profile: No results for input(s): INR, PROTIME in the last 168 hours. Thyroid  Function Tests: No results for input(s): TSH, T4TOTAL, FREET4, T3FREE, THYROIDAB in the last 72 hours. Lipid Profile: No results for input(s): CHOL, HDL, LDLCALC, TRIG, CHOLHDL, LDLDIRECT in the last 72 hours. Anemia Panel: No results for input(s): VITAMINB12, FOLATE, FERRITIN, TIBC, IRON, RETICCTPCT in the last 72 hours. Urine analysis:    Component Value Date/Time   COLORURINE STRAW (A) 02/01/2024 0711   APPEARANCEUR CLEAR 02/01/2024 0711   LABSPEC 1.003 (L) 02/01/2024 0711   PHURINE 7.0 02/01/2024 0711   GLUCOSEU NEGATIVE 02/01/2024 0711   GLUCOSEU NEGATIVE 07/05/2016 1637   HGBUR NEGATIVE 02/01/2024  0711   BILIRUBINUR NEGATIVE 02/01/2024 0711   BILIRUBINUR negative 03/28/2021 1459   KETONESUR NEGATIVE 02/01/2024 0711   PROTEINUR NEGATIVE 02/01/2024 0711   UROBILINOGEN 0.2 03/28/2021 1459   UROBILINOGEN 0.2 07/05/2016 1637   NITRITE NEGATIVE 02/01/2024 0711   LEUKOCYTESUR SMALL (A) 02/01/2024 0711   Sepsis Labs: Invalid input(s): PROCALCITONIN, LACTICIDVEN  Microbiology: No results found for this or any previous visit (from the past 240 hours).  Radiology Studies: No results found.  Scheduled Meds:  aspirin  EC  81 mg Oral Daily   buPROPion   300 mg Oral Daily   cholecalciferol   400 Units Oral Daily   enoxaparin  (LOVENOX ) injection  40 mg Subcutaneous Q24H   ezetimibe   10 mg Oral Daily   latanoprost   1 drop Right Eye QHS   loratadine   10 mg Oral Daily   pantoprazole   40 mg Oral Daily   propranolol   10 mg Oral TID  sertraline   200 mg Oral Daily   SUMAtriptan   20 mg Nasal Once   Continuous Infusions:   LOS: 1 day   35 minutes with more than 50% spent in reviewing records, counseling patient/family and coordinating care.  Reyes VEAR Gaw, MD [facility] [email or web address] 02/02/2024, 10:17 AM

## 2024-02-03 DIAGNOSIS — E871 Hypo-osmolality and hyponatremia: Secondary | ICD-10-CM | POA: Diagnosis not present

## 2024-02-03 LAB — BASIC METABOLIC PANEL WITH GFR
Anion gap: 9 (ref 5–15)
Anion gap: 10 (ref 5–15)
BUN: 10 mg/dL (ref 8–23)
BUN: 14 mg/dL (ref 8–23)
CO2: 23 mmol/L (ref 22–32)
CO2: 22 mmol/L (ref 22–32)
Calcium: 8.5 mg/dL — ABNORMAL LOW (ref 8.9–10.3)
Calcium: 8.9 mg/dL (ref 8.9–10.3)
Chloride: 101 mmol/L (ref 98–111)
Chloride: 101 mmol/L (ref 98–111)
Creatinine, Ser: 0.8 mg/dL (ref 0.44–1.00)
Creatinine, Ser: 0.92 mg/dL (ref 0.44–1.00)
GFR, Estimated: >60 mL/min (ref >60)
GFR, Estimated: >60 mL/min (ref >60)
Glucose, Bld: 92 mg/dL (ref 70–99)
Glucose, Bld: 97 mg/dL (ref 70–99)
Potassium: 4 mmol/L (ref 3.5–5.1)
Potassium: 4.1 mmol/L (ref 3.5–5.1)
Sodium: 133 mmol/L — ABNORMAL LOW (ref 135–145)
Sodium: 133 mmol/L — ABNORMAL LOW (ref 135–145)

## 2024-02-03 MED ORDER — SODIUM CHLORIDE 0.9 % IV SOLN: INTRAVENOUS | Status: DC

## 2024-02-03 NOTE — Plan of Care (Signed)

## 2024-02-03 NOTE — Progress Notes (Signed)
 PROGRESS NOTE  Cynthia [name]  FMW:[id number] DOB: 02/02/1946 DOA: 02/01/2024 PCP: Amon Aloysius BRAVO, MD  Consultants  Brief Narrative: 78 y.o. female with medical history significant of migraines, R eye glaucoma, PUD, RLS, TIA, GERD, and depression p/w hyponatremia iso migraine. The patient reported experiencing a migraine beginning the day prior to admission.. The headache worsened over time. Despite taking medication (tylenol  tramadol , and , the patient had difficulty sleeping and needed to take an additional sleeping pill to fall asleep.  Also with recurrent vomiting that started night before admission.  The headache persisted into the next day and worsened; as such, she presented to the ED for evaluation.  While in the emergency department, labs notable for Na 124. CT abd w/ distended bladder and CT head w/ NAICA. EDP requested admission for migraine and hyponatremia.    Assessment & Plan: Hyponatremia Presumed hypovolemic hyponatremia iso poor PO intake given reported migraine and vomiting. -Current Na 124; goal Na increase <47mEq/d to prevent ODS-->corrected to 138 yesterday, IVF stopped.  Completely asymptomatic. - Na was lower today at 133, dropped after stopping fluids.  Recheck the same.  - likely able to DC home and follow up outpatient, but after talking with patient, plan will be to rehydrate overnight and recheck tomorrow AM.  If improved, plan will be DC home at that point.     Migraine - Pt follows with Dr. Skeet and was last seen in 12/2019 and advised to take verapamil  CR 180mg   for prevention and tramadol  prn -Continue pta propanolol 10mg  TID (pt reports only taking BID pta); consider switching back to verapamil  for ease of dosing -Sumatriptan  x1 given in ED and she reported this resolved her migraine. - Reports that she was previously on sumatriptan  years ago but has not been on this recently.  Plan will be to start this as needed in hospital and at discharge as it was so helpful  for her and she has been off it for quite some time  Bladder distention/retention -Foley placed in the ER. - Foley DC'ed, has been voiding well x 24 hours.    R eye glaucoma -PTA Xalatan  1 drop R eye nightly in place of pta Travatan   HLD -PTA Zetia    GERD -PTA PPI   Depression -PTA wellbutrin , and sertraline    DVT prophylaxis:  enoxaparin  (LOVENOX ) injection 40 mg Start: 02/01/24 1400  Code Status:   Code Status: Full Code Level of care: Med-Surg Status is: Inpatient Dispo: Remain in house today and watch sodium.  If sodium remains good and no further urinary retention likely able to DC tomorrow 9/21.   Subjective: Patient continues to feel well.  No further headaches.  Eating and drinking well.    Objective: Vitals:   02/03/24 0842 02/03/24 0907 02/03/24 1538 02/03/24 1547  BP: (!) 144/86 138/80 139/71 139/71  Pulse: 66 72 69 69  Resp:  16 18   Temp:  97.8 F (36.6 C) 98.3 F (36.8 C)   TempSrc:   Oral   SpO2:  96% 96%   Weight:      Height:       No intake or output data in the 24 hours ending 02/03/24 1719  Filed Weights   02/01/24 0158  Weight: 82.1 kg   Body mass index is 31.07 kg/m.  Gen: 78 y.o. female in no apparent distress.  Nontoxic, pleasant Pulm: Non-labored breathing.  Clear to auscultation bilaterally.  CV: Regular rate and rhythm. No murmur, rub, or gallop. No JVD  GI: Abdomen soft, non-tender, non-distended Ext: Warm, no deformities, no pedal edema Skin: No rashes, lesions no ulcers Neuro: Alert and oriented x 4. No focal neurological deficits. Psych: Calm  Judgement and insight appear normal. Mood & affect appropriate.     I have personally reviewed the following labs and images: CBC: Recent Labs  Lab 02/01/24 0208 02/02/24 0804  WBC 8.4 6.2  NEUTROABS 5.7  --   HGB 11.0* 11.5*  HCT 33.2* 35.7*  MCV 91.2 90.4  PLT 337 349   BMP &GFR Recent Labs  Lab 02/01/24 0800 02/01/24 1941 02/02/24 0743 02/03/24 0951  02/03/24 1533  NA 133* 135 138 133* 133*  K 3.8 3.7 4.0 4.0 4.1  CL 103 102 104 101 101  CO2 20* 25 24 23 22   GLUCOSE 86 98 92 92 97  BUN <5* <5* 5* 10 14  CREATININE 0.54 0.78 0.65 0.80 0.92  CALCIUM  8.8* 8.6* 8.4* 8.5* 8.9   Estimated Creatinine Clearance: 51.4 mL/min (by C-G formula based on SCr of 0.92 mg/dL). Liver & Pancreas: Recent Labs  Lab 02/01/24 0208  AST 23  ALT 16  ALKPHOS 94  BILITOT 0.8  PROT 6.2*  ALBUMIN 3.6   No results for input(s): LIPASE, AMYLASE in the last 168 hours. No results for input(s): AMMONIA in the last 168 hours. Diabetic: No results for input(s): HGBA1C in the last 72 hours. No results for input(s): GLUCAP in the last 168 hours. Cardiac Enzymes: No results for input(s): CKTOTAL, CKMB, CKMBINDEX, TROPONINI in the last 168 hours. No results for input(s): PROBNP in the last 8760 hours. Coagulation Profile: No results for input(s): INR, PROTIME in the last 168 hours. Thyroid  Function Tests: No results for input(s): TSH, T4TOTAL, FREET4, T3FREE, THYROIDAB in the last 72 hours. Lipid Profile: No results for input(s): CHOL, HDL, LDLCALC, TRIG, CHOLHDL, LDLDIRECT in the last 72 hours. Anemia Panel: No results for input(s): VITAMINB12, FOLATE, FERRITIN, TIBC, IRON, RETICCTPCT in the last 72 hours. Urine analysis:    Component Value Date/Time   COLORURINE STRAW (A) 02/01/2024 0711   APPEARANCEUR CLEAR 02/01/2024 0711   LABSPEC 1.003 (L) 02/01/2024 0711   PHURINE 7.0 02/01/2024 0711   GLUCOSEU NEGATIVE 02/01/2024 0711   GLUCOSEU NEGATIVE 07/05/2016 1637   HGBUR NEGATIVE 02/01/2024 0711   BILIRUBINUR NEGATIVE 02/01/2024 0711   BILIRUBINUR negative 03/28/2021 1459   KETONESUR NEGATIVE 02/01/2024 0711   PROTEINUR NEGATIVE 02/01/2024 0711   UROBILINOGEN 0.2 03/28/2021 1459   UROBILINOGEN 0.2 07/05/2016 1637   NITRITE NEGATIVE 02/01/2024 0711   LEUKOCYTESUR SMALL (A) 02/01/2024 0711    Sepsis Labs: Invalid input(s): PROCALCITONIN, LACTICIDVEN  Microbiology: No results found for this or any previous visit (from the past 240 hours).  Radiology Studies: No results found.  Scheduled Meds:  aspirin  EC  81 mg Oral Daily   buPROPion   300 mg Oral Daily   cholecalciferol   400 Units Oral Daily   enoxaparin  (LOVENOX ) injection  40 mg Subcutaneous Q24H   ezetimibe   10 mg Oral Daily   latanoprost   1 drop Right Eye QHS   loratadine   10 mg Oral Daily   pantoprazole   40 mg Oral Daily   propranolol   10 mg Oral TID   sertraline   200 mg Oral Daily   SUMAtriptan   20 mg Nasal Once   Continuous Infusions:  sodium chloride  100 mL/hr at 02/03/24 1544     LOS: 2 days   35 minutes with more than 50% spent in reviewing records, counseling patient/family and coordinating  care.  Reyes VEAR Gaw, MD [facility] [email or web address] 02/03/2024, 5:19 PM

## 2024-02-03 NOTE — Plan of Care (Signed)

## 2024-02-03 NOTE — Plan of Care (Signed)
 Pt has no headache so far this shift.  She likes the imitrex  spray and plans on asking her MD for this prior to discharge.  Education provided on hyponatremia and drinking gatorade mixed with her water.  Pt is complaining of inner ear pain on the right side. She told MD about it this am and he stated she will need to go to her primary care physician for an ear check.  Also a painful lymph node is in that region.   Problem: Health Behavior/Discharge Planning: Goal: Ability to manage health-related needs will improve Outcome: Progressing   Problem: Clinical Measurements: Goal: Ability to maintain clinical measurements within normal limits will improve Outcome: Progressing Goal: Will remain free from infection Outcome: Progressing Goal: Diagnostic test results will improve Outcome: Progressing Goal: Respiratory complications will improve Outcome: Progressing Goal: Cardiovascular complication will be avoided Outcome: Progressing   Problem: Activity: Goal: Risk for activity intolerance will decrease Outcome: Progressing   Problem: Nutrition: Goal: Adequate nutrition will be maintained Outcome: Progressing   Problem: Coping: Goal: Level of anxiety will decrease Outcome: Progressing   Problem: Pain Managment: Goal: General experience of comfort will improve and/or be controlled Outcome: Progressing   Problem: Safety: Goal: Ability to remain free from injury will improve Outcome: Progressing

## 2024-02-04 DIAGNOSIS — E871 Hypo-osmolality and hyponatremia: Secondary | ICD-10-CM | POA: Diagnosis not present

## 2024-02-04 LAB — BASIC METABOLIC PANEL WITH GFR
Anion gap: 8 (ref 5–15)
BUN: 12 mg/dL (ref 8–23)
CO2: 23 mmol/L (ref 22–32)
Calcium: 8.5 mg/dL — ABNORMAL LOW (ref 8.9–10.3)
Chloride: 104 mmol/L (ref 98–111)
Creatinine, Ser: 0.78 mg/dL (ref 0.44–1.00)
GFR, Estimated: >60 mL/min (ref >60)
Glucose, Bld: 92 mg/dL (ref 70–99)
Potassium: 3.8 mmol/L (ref 3.5–5.1)
Sodium: 135 mmol/L (ref 135–145)

## 2024-02-04 MED ORDER — SUMATRIPTAN 20 MG/ACT NA SOLN: 20.0000 mg | Freq: Once | NASAL | 0 refills | Status: DC

## 2024-02-04 NOTE — Discharge Summary (Signed)
 Physician Discharge Summary   Patient: Cynthia [name] MRN: [id number] DOB: 10-28-1945  Admit date:     02/01/2024  Discharge date: 02/04/24  Discharge Physician: Reyes VEAR Gaw   PCP: Amon Aloysius BRAVO, MD   Recommendations at discharge:   Patient discharged with recommendations to follow-up later this week with PCP for repeat sodium check.  She is also noted to be on 200 mg sertraline  at home plus bupropion .  High dose SSRIs can symptoms lead to hyponatremia and if her sodium level remains on the low side, would recommend consideration of decreasing her dose of sertraline  based on her depression levels. She also received Imitrex  in the emergency department for migraine and her migraine resolved about 45 minutes afterwards.  She reports remote use of this and eventually stopping helpful but now that she has been quite sometime without it might be helpful again so therefore if discharged her home with refills and recommendations to continue to follow-up with her neurologist for migraines. Pt reports receiving electrolyte packages just prior to hospitalization but did not actually start these.  I do not see that how 1 of these daily would be harmful so recommended she can start these after discharge to also help with sodium regulation.  The important thing will be to follow-up with her PCP as above.  Discharge Diagnoses: Principal Problem:   Hyponatremia  Resolved Problems:   * No resolved hospital problems. *  Hospital Course: 78 y.o. female with medical history significant of migraines, R eye glaucoma, PUD, RLS, TIA, GERD, and depression p/w hyponatremia iso migraine. The patient reported experiencing a migraine beginning the day prior to admission.. The headache worsened over time. Despite taking medication (tylenol  tramadol , and , the patient had difficulty sleeping and needed to take an additional sleeping pill to fall asleep.  Also with recurrent vomiting that started night before admission.   The headache persisted into the next day and worsened; as such, she presented to the ED for evaluation.  While in the emergency department, labs notable for Na 124. CT abd w/ distended bladder and CT head w/ NAICA. EDP requested admission for migraine and hyponatremia.    Migraine resolved while in the emergency department status post a dose of Imitrex .  Her sodium corrected initially to 138.  We did stop her IV fluids to ensure that she could maintain good sodium levels but unfortunately hospital day 2 these dropped back to the abnormal range.  We started short-term course of IV fluids and then watched overnight 1 more night and on hospital day #3 her sodium levels have corrected 135.  Because of this and because she had so well clinically improved and had no further headaches/migraines, we were able to discharge her on 02/04/2024.   Assessment & Plan: Hyponatremia Presumed hypovolemic hyponatremia iso poor PO intake given reported migraine and vomiting. - See above for progression.  Sodium 135 today and able to discharge.   Migraine - Pt follows with Dr. Skeet and was last seen in 12/2019 and advised to take verapamil  CR 180mg   for prevention and tramadol  prn -Continue pta propanolol 10mg  TID (pt reports only taking BID pta); consider switching back to verapamil  for ease of dosing -Sumatriptan  x1 given in ED and she reported this resolved her migraine. - Reports that she was previously on sumatriptan  years ago but has not been on this recently.  Plan will be to start this as needed in hospital and at discharge as it was so helpful for her and  she has been off it for quite some time   Bladder distention/retention -Foley placed in the ER. - Foley DC'ed, has been voiding well x 48 hours.    R eye glaucoma -PTA Xalatan  1 drop R eye nightly in place of pta Travatan   HLD -PTA Zetia    GERD -PTA PPI   Depression -PTA wellbutrin , and sertraline  - High dose sertraline  200 mg could be cause of  hyponatremia above. - Recommended to discuss outpatient with her PCP        Consultants: None Procedures performed: None Disposition: Home Diet recommendation:  Discharge Diet Orders (From admission, onward)     Start     Ordered   02/04/24 0000  Diet - low sodium heart healthy        02/04/24 1047           Regular DISCHARGE MEDICATION: Allergies as of 02/04/2024       Reactions   Motrin  [ibuprofen ]    GI bleed   Statins    delibitating pain all over body   Sulfonamide Derivatives    Pt unsure of reaction   Bee Venom Swelling, Rash   Ciprofloxacin Swelling, Rash        Medication List     TAKE these medications    aspirin  EC 81 MG tablet Take 81 mg by mouth daily. Swallow whole.   baclofen  10 MG tablet Commonly known as: LIORESAL  Take 1 tablet (10 mg total) by mouth 3 (three) times daily as needed for muscle spasms.   buPROPion  300 MG 24 hr tablet Commonly known as: WELLBUTRIN  XL Take 1 tablet (300 mg total) by mouth daily.   calcipotriene 0.005 % cream Commonly known as: DOVONOX Apply 1 Application topically 2 (two) times daily.   cetirizine 10 MG tablet Commonly known as: ZYRTEC Take 10 mg by mouth at bedtime.   cholecalciferol  10 MCG (400 UNIT) Tabs tablet Commonly known as: VITAMIN D3 Take 400 Units by mouth daily.   ezetimibe  10 MG tablet Commonly known as: ZETIA  Take 1 tablet (10 mg total) by mouth daily.   fluticasone  50 MCG/ACT nasal spray Commonly known as: FLONASE  Place 2 sprays into both nostrils daily. What changed:  when to take this reasons to take this   gabapentin  300 MG capsule Commonly known as: NEURONTIN  Take 1 capsule (300 mg total) by mouth 2 (two) times daily.   ICAPS AREDS 2 PO Take 1 capsule by mouth daily.   nystatin  100000 UNIT/ML suspension Commonly known as: MYCOSTATIN  Take 5 mLs (500,000 Units total) by mouth 4 (four) times daily. What changed:  when to take this reasons to take this    omeprazole  40 MG capsule Commonly known as: PRILOSEC TAKE 1 CAPSULE BY MOUTH EVERY DAY   propranolol  10 MG tablet Commonly known as: INDERAL  Take 1 tablet (10 mg total) by mouth 3 (three) times daily. What changed: when to take this   sertraline  100 MG tablet Commonly known as: ZOLOFT  Take 2 tablets (200 mg total) by mouth daily. What changed: when to take this   SUMAtriptan  20 MG/ACT nasal spray Commonly known as: IMITREX  Place 1 spray (20 mg total) into the nose once for 1 dose. May repeat in 2 hours if headache persists or recurs.  Only 2 doses per 24 hrs   traMADol  50 MG tablet Commonly known as: ULTRAM  Take 1 tablet (50 mg total) by mouth daily as needed.   Travoprost (BAK Free) 0.004 % Soln ophthalmic solution Commonly known as:  TRAVATAN Place 1 drop into the right eye at bedtime.   triamcinolone  cream 0.1 % Commonly known as: KENALOG  Apply 1 Application topically daily as needed (rash).   zolpidem  5 MG tablet Commonly known as: AMBIEN  TAKE 1 TABLET (5 MG TOTAL) BY MOUTH AT BEDTIME AS NEEDED FOR SLEEP.        Discharge Exam: Filed Weights   02/01/24 0158  Weight: 82.1 kg   Gen: 78 y.o. female in no apparent distress.  Nontoxic, pleasant Pulm: Non-labored breathing.  Clear to auscultation bilaterally.  CV: Regular rate and rhythm. No murmur, rub, or gallop. No JVD GI: Abdomen soft, non-tender, non-distended Ext: Warm, no deformities, no pedal edema Skin: No rashes, lesions no ulcers Neuro: Alert and oriented x 4. No focal neurological deficits. Psych: Calm  Judgement and insight appear normal. Mood & affect appropriate.   Condition at discharge: good  The results of significant diagnostics from this hospitalization (including imaging, microbiology, ancillary and laboratory) are listed below for reference.   Imaging Studies: CT ABDOMEN PELVIS W CONTRAST Result Date: 02/01/2024 CLINICAL DATA:  78 year old female with acute abdominal pain. EXAM: CT  ABDOMEN AND PELVIS WITH CONTRAST TECHNIQUE: Multidetector CT imaging of the abdomen and pelvis was performed using the standard protocol following bolus administration of intravenous contrast. RADIATION DOSE REDUCTION: This exam was performed according to the departmental dose-optimization program which includes automated exposure control, adjustment of the mA and/or kV according to patient size and/or use of iterative reconstruction technique. CONTRAST:  75mL OMNIPAQUE  IOHEXOL  350 MG/ML SOLN COMPARISON:  CT Abdomen and Pelvis 10/25/2010. FINDINGS: Lower chest: Borderline to mild cardiomegaly now. No pericardial or pleural effusion. Platelike right lower lobe atelectasis or scarring, and mild postinflammatory appearing tree-in-bud nodularity in the lateral basal segment of that lobe (no follow-up imaging recommended). Hepatobiliary: Negative liver and gallbladder. Pancreas: Negative. Spleen: Negative. Adrenals/Urinary Tract: Normal adrenal glands. Nonobstructed kidneys with symmetric renal enhancement and contrast excretion. The ureters are both prominent, but symmetric, and there is excreted IV contrast into the distal ureters and into the urinary bladder on the delayed images. The bladder is distended, volume estimated at 680 mL. Punctate pelvic phleboliths, but no urinary calculus identified. No bladder wall thickening or perivesical inflammation. Stomach/Bowel: Nondilated large and small bowel loops. Mild sigmoid diverticulosis without active inflammation. Mild to moderate large bowel retained stool including in the transverse colon. Appendix is within normal limits on series 3, image 54. No large bowel inflammation. Small fat containing supraumbilical hernia. No herniated bowel or incarceration. Small phrenic ampulla versus hiatal hernia. Otherwise negative stomach and duodenum. No pneumoperitoneum, free fluid, mesenteric inflammation. Vascular/Lymphatic: Aortoiliac calcified atherosclerosis. Mild arterial  tortuosity. Negative for abdominal aortic aneurysm. Portal venous system is patent. No lymphadenopathy. Reproductive: Negative. Other: No pelvis free fluid. Musculoskeletal: Progressed lumbar spine degeneration since 2012. Bulky and partially calcified left lateral recess disc extrusion at T12-L1 (series 7, image 72) contributes also to severe left foraminal stenosis at that level. Multilevel vacuum disc there and elsewhere. No acute osseous abnormality identified. IMPRESSION: 1. Distended urinary bladder (680 mL) suggesting urinary retention. No urinary calculus or obstructive uropathy. 2. No other acute or inflammatory process identified in the abdomen or pelvis. 3.  Aortic Atherosclerosis (ICD10-I70.0). 4. 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 Electronically Signed   By: VEAR Hurst M.D.   On: 02/01/2024 06:35   CT HEAD WO CONTRAST ( ) Result Date: 02/01/2024 CLINICAL DATA:  Headache, increasing frequency or severity EXAM: CT HEAD WITHOUT CONTRAST  TECHNIQUE: Contiguous axial images were obtained from the base of the skull through the vertex without intravenous contrast. RADIATION DOSE REDUCTION: This exam was performed according to the departmental dose-optimization program which includes automated exposure control, adjustment of the mA and/or kV according to patient size and/or use of iterative reconstruction technique. COMPARISON:  MRI 12/30/2023. FINDINGS: Brain: No evidence of acute infarction, hemorrhage, hydrocephalus, extra-axial collection or mass lesion/mass effect. Vascular: No hyperdense vessel. Skull: No acute fracture. Sinuses/Orbits: Left sphenoid and posterior left ethmoid air cell mucosal thickening. No acute orbital findings. Other: No mastoid effusions. IMPRESSION: No evidence of acute intracranial abnormality. Electronically Signed   By: Gilmore GORMAN Molt M.D.   On: 02/01/2024 02:35    Microbiology: Results for orders placed or performed  in visit on 03/28/21  Urine Culture     Status: Abnormal   Collection Time: 03/28/21  3:04 PM   Specimen: Urine  Result Value Ref Range Status   MICRO NUMBER: [id number]  Final   SPECIMEN QUALITY: Adequate  Final   Sample Source NOT GIVEN  Final   STATUS: FINAL  Final   ISOLATE 1: Klebsiella pneumoniae (A)  Final    Comment: 10,000-49,000 CFU/mL of Klebsiella pneumoniae      Susceptibility   Klebsiella pneumoniae - URINE CULTURE, REFLEX    AMOX/CLAVULANIC <=2 Sensitive     AMPICILLIN 16 Resistant     AMPICILLIN/SULBACTAM 4 Sensitive     CEFAZOLIN* <=4 Not Reportable      * For infections other than uncomplicated UTI caused by E. coli, K. pneumoniae or P. mirabilis: Cefazolin is resistant if MIC > or = 8 mcg/mL. (Distinguishing susceptible versus intermediate for isolates with MIC < or = 4 mcg/mL requires additional testing.) For uncomplicated UTI caused by E. coli, K. pneumoniae or P. mirabilis: Cefazolin is susceptible if MIC <32 mcg/mL and predicts susceptible to the oral agents cefaclor, cefdinir, cefpodoxime, cefprozil, cefuroxime, cephalexin  and loracarbef.     CEFTAZIDIME <=1 Sensitive     CEFEPIME <=1 Sensitive     CEFTRIAXONE <=1 Sensitive     CIPROFLOXACIN <=0.25 Sensitive     LEVOFLOXACIN <=0.12 Sensitive     GENTAMICIN <=1 Sensitive     IMIPENEM <=0.25 Sensitive     NITROFURANTOIN 64 Intermediate     PIP/TAZO <=4 Sensitive     TOBRAMYCIN <=1 Sensitive     TRIMETH/SULFA* <=20 Sensitive      * For infections other than uncomplicated UTI caused by E. coli, K. pneumoniae or P. mirabilis: Cefazolin is resistant if MIC > or = 8 mcg/mL. (Distinguishing susceptible versus intermediate for isolates with MIC < or = 4 mcg/mL requires additional testing.) For uncomplicated UTI caused by E. coli, K. pneumoniae or P. mirabilis: Cefazolin is susceptible if MIC <32 mcg/mL and predicts susceptible to the oral agents cefaclor, cefdinir, cefpodoxime, cefprozil, cefuroxime,  cephalexin  and loracarbef. Legend: S = Susceptible  I = Intermediate R = Resistant  NS = Not susceptible * = Not tested  NR = Not reported **NN = See antimicrobic comments     Labs: CBC: Recent Labs  Lab 02/01/24 0208 02/02/24 0804  WBC 8.4 6.2  NEUTROABS 5.7  --   HGB 11.0* 11.5*  HCT 33.2* 35.7*  MCV 91.2 90.4  PLT 337 349   Basic Metabolic Panel: Recent Labs  Lab 02/01/24 1941 02/02/24 0743 02/03/24 0951 02/03/24 1533 02/04/24 0455  NA 135 138 133* 133* 135  K 3.7 4.0 4.0 4.1 3.8  CL 102 104 101 101 104  CO2 25 24 23 22 23   GLUCOSE 98 92 92 97 92  BUN <5* 5* 10 14 12   CREATININE 0.78 0.65 0.80 0.92 0.78  CALCIUM  8.6* 8.4* 8.5* 8.9 8.5*   Liver Function Tests: Recent Labs  Lab 02/01/24 0208  AST 23  ALT 16  ALKPHOS 94  BILITOT 0.8  PROT 6.2*  ALBUMIN 3.6   CBG: No results for input(s): GLUCAP in the last 168 hours.  Discharge time spent: less than 30 minutes.  Signed: Reyes VEAR Gaw, MD [facility] 02/04/2024

## 2024-02-04 NOTE — Care Management Important Message (Signed)
 Important Message  Patient Details  Name: Cynthia [name] MRN: [id number] Date of Birth: 1946/02/12   Important Message Given:  Yes - Medicare IM  Patient left prior to IM delivery will mail acopy to the patient home address   Claretta Deed 02/04/2024, 1:37 PM

## 2024-02-04 NOTE — Progress Notes (Signed)
 Discharge instructions completed with pt and copy given.  22g IV removed from right forearm with tip intact.  All questions answered.

## 2024-02-04 NOTE — Progress Notes (Signed)
 Pt taken outside to private vehicle via wheelchair.  Discharged home in stable condition with all belongings.

## 2024-02-04 NOTE — Progress Notes (Signed)
 Mobility Specialist Progress Note:    02/04/24 1125  Mobility  Activity Stood at bedside;Dangled on edge of bed;Ambulated with assistance  Level of Assistance Modified independent, requires aide device or extra time  Assistive Device None  Distance Ambulated (ft) 3 ft  Activity Response Tolerated well  Mobility Referral Yes  Mobility visit 1 Mobility  Mobility Specialist Start Time (ACUTE ONLY) 1043  Mobility Specialist Stop Time (ACUTE ONLY) 1053  Mobility Specialist Time Calculation (min) (ACUTE ONLY) 10 min   Received pt in bed and agreeable to limited mobility. Pt sat EOB, stood and EOB and took a couple steps towards chair in room. Pt deferred further mobility d/t pending discharged. Left pt in bed. Personal belongings and call light within reach. All needs met.   Lavanda Pollack Mobility Specialist  Please contact via [employer] or  Rehab Office [phone]

## 2024-02-05 ENCOUNTER — Telehealth: Payer: Self-pay

## 2024-02-05 NOTE — Assessment & Plan Note (Signed)
 Per ED note: Pt is on 200 mg sertraline  at home plus bupropion . High dose SSRIs can symptoms lead to hyponatremia.  Will initiate gradual taper of sertraline  from 200 mg daily to 100 mg daily. Decrease to 150 mg daily (1.5 tablets) for 2 weeks, then to 100 mg daily (1 tablet) for 2 weeks. Follow up with PCP following titration to recheck CMP and reassess mood.

## 2024-02-05 NOTE — Transitions of Care (Post Inpatient/ED Visit) (Unsigned)
   02/05/2024  Name: Cynthia [name] MRN: [id number] DOB: [birth year]  Today's TOC FU Call Status: Today's TOC FU Call Status:: Unsuccessful Call (1st Attempt) Unsuccessful Call (1st Attempt) Date: 02/05/24  Attempted to reach the patient regarding the most recent Inpatient/ED visit.  Follow Up Plan: Additional outreach attempts will be made to reach the patient to complete the Transitions of Care (Post Inpatient/ED visit) call.   Signature Julian Lemmings, LPN [facility] Nurse Health Advisor Direct Dial [phone]

## 2024-02-05 NOTE — Progress Notes (Unsigned)
 Subjective:     Patient ID: Cynthia [name], female    DOB: [birth year], 78 y.o.   MRN: [id number]  No chief complaint on file.   HPI  Cynthia [name] is a 78 yo female who presents for hospital follow up   Recent hospitalization Location: [facility] Admission date: 9/19 Discharge date:9/22 CC: Migraine and recurrent vomiting  Diagnosis: Hyponatremia  Patient report experience migraine 1 day prior to admission.  She had tried Tylenol , tramadol  was unable to sleep due to the migraine.  She experienced recurrent vomiting and persistent next day to emergency department.  Her sodium at that time was 124.  CT head from 02/01/2024 impression is no evidence of acute cranial abnormality.  History of Present Illness            Health Maintenance Due  Topic Date Due   Influenza Vaccine  12/14/2023   COVID-19 Vaccine (8 - 2025-26 season) 01/14/2024    Past Medical History:  Diagnosis Date   Allergy    Anemia    Anxiety    Arthritis    Blood transfusion without reported diagnosis    Cataract    Constipation, chronic    DEGENERATIVE JOINT DISEASE 06/02/2010   Depression    GERD (gastroesophageal reflux disease)    Glaucoma 05/15/2012   Right eye   Headache(784.0)    Hyperlipidemia    hx of   Osteopenia    Osteoporosis    Psoriasis    PUD (peptic ulcer disease) 05/15/2010   Upper GI bleed d/t NSAIDs   RLS (restless legs syndrome)    robaxin  prn   TIA (transient ischemic attack) 09/2019    Past Surgical History:  Procedure Laterality Date   ANKLE FRACTURE SURGERY Left    surgery 12-11 (LEFT)   CESAREAN SECTION  05/16/1967   COLONOSCOPY  12/17/2013   EYE SURGERY  2020   cataract removal   FRACTURE SURGERY  2012   fracture in foot   JOINT REPLACEMENT  August, 2023   Left knee   TOTAL KNEE ARTHROPLASTY Left 12/28/2021   UPPER GASTROINTESTINAL ENDOSCOPY      Family History  Problem Relation Age of Onset   Aneurysm Mother        brain   Celiac disease  Brother    Diabetes Maternal Grandfather    Hypertension Neg Hx    Breast cancer Neg Hx    Coronary artery disease Neg Hx    Colon cancer Neg Hx    Esophageal cancer Neg Hx    Stomach cancer Neg Hx    Rectal cancer Neg Hx    Colon polyps Neg Hx     Social History   Socioeconomic History   Marital status: Widowed    Spouse name: Not on file   Number of children: 1   Years of education: Not on file   Highest education level: Doctorate  Occupational History   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  Tobacco Use   Smoking status: Never    Passive exposure: Never   Smokeless tobacco: Never  Vaping Use   Vaping status: Never Used  Substance and Sexual Activity   Alcohol use: Yes    Comment: socially   Drug use: No   Sexual activity: Not Currently  Other Topics Concern   Not on file  Social History Narrative   2 step children, 1 child , 7 GK   Lost husband, [place] 02[place]13-[place] ;  apartment, 3th floor w/ elevator      Pt is right handed   Social Drivers of Health   Financial Resource Strain: Low Risk  (08/14/2023)   Overall Financial Resource Strain (CARDIA)    Difficulty of Paying Living Expenses: Not hard at all  Food Insecurity: No Food Insecurity (02/01/2024)   Hunger Vital Sign    Worried About Running Out of Food in the Last Year: Never true    Ran Out of Food in the Last Year: Never true  Transportation Needs: No Transportation Needs (02/01/2024)   PRAPARE - Administrator, Civil Service (Medical): No    Lack of Transportation (Non-Medical): No  Physical Activity: Insufficiently Active (08/14/2023)   Exercise Vital Sign    Days of Exercise per Week: 2 days    Minutes of Exercise per Session: 40 min  Stress: No Stress Concern Present (08/14/2023)   [employer] of Occupational Health - Occupational Stress Questionnaire    Feeling of Stress : Not at all  Social Connections: Moderately Integrated (02/01/2024)   Social  Connection and Isolation Panel    Frequency of Communication with Friends and Family: Three times a week    Frequency of Social Gatherings with Friends and Family: More than three times a week    Attends Religious Services: More than 4 times per year    Active Member of Clubs or Organizations: Yes    Attends Banker Meetings: More than 4 times per year    Marital Status: Widowed  Intimate Partner Violence: Not At Risk (02/01/2024)   Humiliation, Afraid, Rape, and Kick questionnaire    Fear of Current or Ex-Partner: No    Emotionally Abused: No    Physically Abused: No    Sexually Abused: No    Outpatient Medications Prior to Visit  Medication Sig Dispense Refill   aspirin  EC 81 MG tablet Take 81 mg by mouth daily. Swallow whole.     baclofen  (LIORESAL ) 10 MG tablet Take 1 tablet (10 mg total) by mouth 3 (three) times daily as needed for muscle spasms. 90 each 5   buPROPion  (WELLBUTRIN  XL) 300 MG 24 hr tablet Take 1 tablet (300 mg total) by mouth daily. 90 tablet 1   calcipotriene (DOVONOX) 0.005 % cream Apply 1 Application topically 2 (two) times daily.     cetirizine (ZYRTEC) 10 MG tablet Take 10 mg by mouth at bedtime.     cholecalciferol  (VITAMIN D3) 10 MCG (400 UNIT) TABS tablet Take 400 Units by mouth daily.     ezetimibe  (ZETIA ) 10 MG tablet Take 1 tablet (10 mg total) by mouth daily. 90 tablet 1   fluticasone  (FLONASE ) 50 MCG/ACT nasal spray Place 2 sprays into both nostrils daily. (Patient taking differently: Place 2 sprays into both nostrils daily as needed for allergies.) 16 g 6   gabapentin  (NEURONTIN ) 300 MG capsule Take 1 capsule (300 mg total) by mouth 2 (two) times daily. 60 capsule 5   Multiple Vitamins-Minerals (ICAPS AREDS 2 PO) Take 1 capsule by mouth daily.     nystatin  (MYCOSTATIN ) 100000 UNIT/ML suspension Take 5 mLs (500,000 Units total) by mouth 4 (four) times daily. (Patient taking differently: Take 5 mLs by mouth 4 (four) times daily as needed (mouth  break outs).) 120 mL 1   omeprazole  (PRILOSEC) 40 MG capsule TAKE 1 CAPSULE BY MOUTH EVERY DAY 90 capsule 1   propranolol  (INDERAL ) 10 MG tablet Take 1 tablet (10 mg total) by mouth 3 (three) times  daily. (Patient taking differently: Take 10 mg by mouth 2 (two) times daily.) 270 tablet 1   sertraline  (ZOLOFT ) 100 MG tablet Take 2 tablets (200 mg total) by mouth daily. (Patient taking differently: Take 200 mg by mouth at bedtime.) 180 tablet 1   SUMAtriptan  (IMITREX ) 20 MG/ACT nasal spray Place 1 spray (20 mg total) into the nose once for 1 dose. May repeat in 2 hours if headache persists or recurs.  Only 2 doses per 24 hrs 18 each 0   traMADol  (ULTRAM ) 50 MG tablet Take 1 tablet (50 mg total) by mouth daily as needed. 30 tablet 0   Travoprost, BAK Free, (TRAVATAN) 0.004 % SOLN ophthalmic solution Place 1 drop into the right eye at bedtime.     triamcinolone  cream (KENALOG ) 0.1 % Apply 1 Application topically daily as needed (rash).     zolpidem  (AMBIEN ) 5 MG tablet TAKE 1 TABLET (5 MG TOTAL) BY MOUTH AT BEDTIME AS NEEDED FOR SLEEP. 30 tablet 2   No facility-administered medications prior to visit.    Allergies  Allergen Reactions   Motrin  [Ibuprofen ]     GI bleed   Statins     delibitating pain all over body   Sulfonamide Derivatives     Pt unsure of reaction   Bee Venom Swelling and Rash   Ciprofloxacin Swelling and Rash    ROS  General: No acute distress. Awake and conversant.  Eyes: Normal conjunctiva, anicteric. Round symmetric pupils.  ENT: Hearing grossly intact. No nasal discharge.  Neck: Neck is supple. No masses or thyromegaly.  Respiratory: CTAB. Respirations are non-labored. No wheezing.  Skin: Warm. No rashes or ulcers.  Psych: Alert and oriented. Cooperative, Appropriate mood and affect, Normal judgment.  CV: RRR. No murmur. No lower extremity edema.  MSK: Normal ambulation. No clubbing or cyanosis.  Neuro:  CN II-XII grossly normal.       Objective:     Physical Exam   There were no vitals taken for this visit. Wt Readings from Last 3 Encounters:  02/01/24 181 lb (82.1 kg)  09/17/23 173 lb (78.5 kg)  08/14/23 171 lb (77.6 kg)       Assessment & Plan:   Problem List Items Addressed This Visit     Hyponatremia   Per ED note: Pt is on 200 mg sertraline  at home plus bupropion . High dose SSRIs can symptoms lead to hyponatremia.  Will initiate gradual taper of sertraline  from 200 mg daily to 100 mg daily. Decrease to 150 mg daily (1.5 tablets) for 2 weeks, then to 100 mg daily (1 tablet) for 2 weeks. Follow up with PCP following titration to recheck CMP and reassess mood.      Other Visit Diagnoses       Hospital discharge follow-up    -  Primary       I am having Lamarr B. Bamberg maintain her cetirizine, Travoprost (BAK Free), aspirin  EC, nystatin , Multiple Vitamins-Minerals (ICAPS AREDS 2 PO), cholecalciferol , fluticasone , traMADol , propranolol , ezetimibe , buPROPion , omeprazole , zolpidem , gabapentin , baclofen , sertraline , calcipotriene, triamcinolone  cream, and SUMAtriptan .  No orders of the defined types were placed in this encounter.

## 2024-02-06 ENCOUNTER — Encounter: Payer: Self-pay | Admitting: Student

## 2024-02-06 ENCOUNTER — Ambulatory Visit (INDEPENDENT_AMBULATORY_CARE_PROVIDER_SITE_OTHER): Admitting: Student

## 2024-02-06 VITALS — BP 112/80 | HR 77 | Temp 98.6°F | Ht 64.0 in | Wt 171.0 lb

## 2024-02-06 DIAGNOSIS — G43809 Other migraine, not intractable, without status migrainosus: Secondary | ICD-10-CM | POA: Diagnosis not present

## 2024-02-06 DIAGNOSIS — Z09 Encounter for follow-up examination after completed treatment for conditions other than malignant neoplasm: Secondary | ICD-10-CM

## 2024-02-06 DIAGNOSIS — F32A Depression, unspecified: Secondary | ICD-10-CM

## 2024-02-06 DIAGNOSIS — F419 Anxiety disorder, unspecified: Secondary | ICD-10-CM

## 2024-02-06 DIAGNOSIS — E871 Hypo-osmolality and hyponatremia: Secondary | ICD-10-CM | POA: Diagnosis not present

## 2024-02-06 IMAGING — DX DG CERVICAL SPINE 2 OR 3 VIEWS
1 series · 1 of 1 positions shown · non-contrast
Comparison: None.

CLINICAL DATA: Posterior neck pain common no known injury, initial
encounter

EXAM:
CERVICAL SPINE - 2-3 VIEW

[[person_name]]
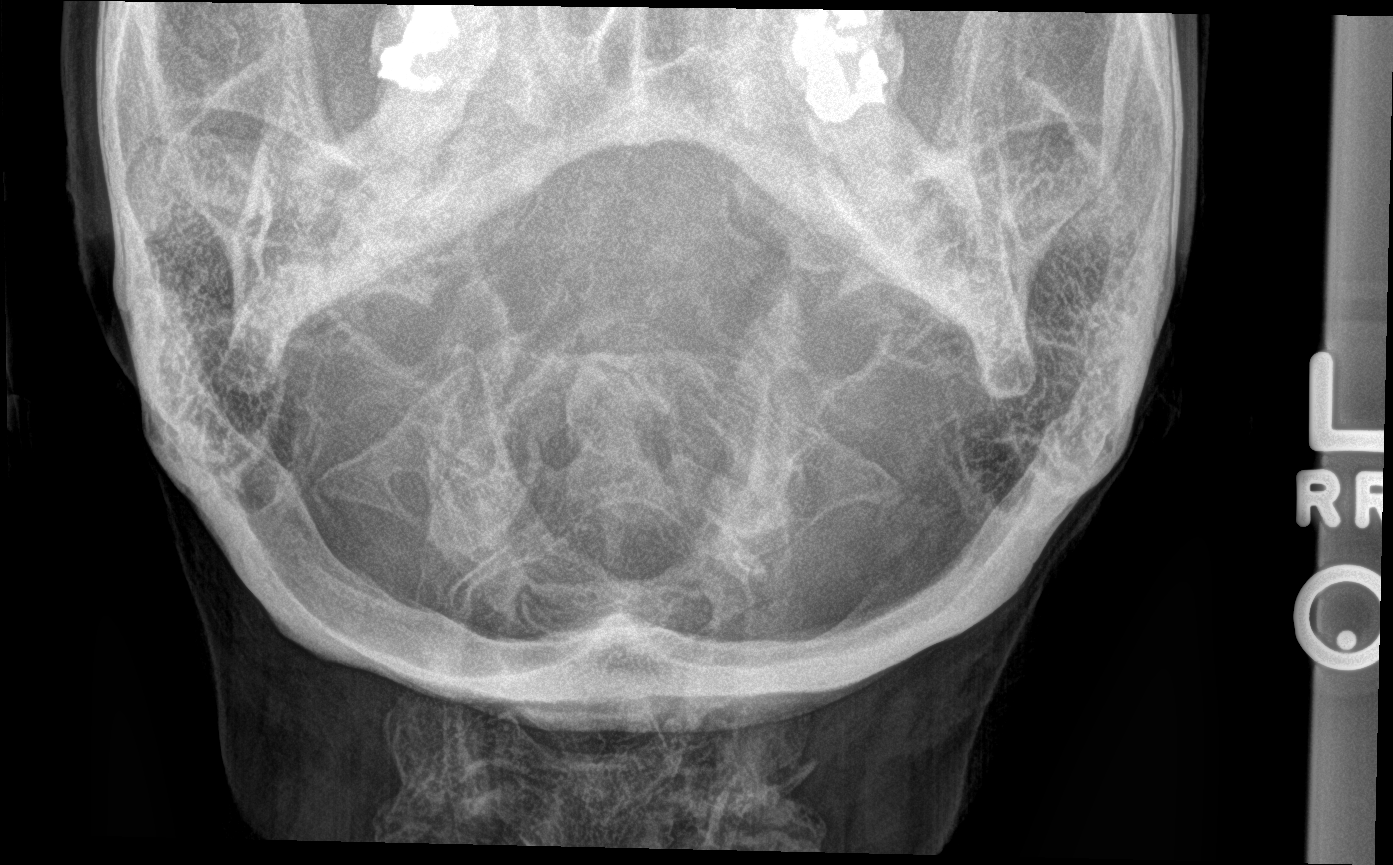

[1 of 1 positions shown; findings below may reference images not displayed]

FINDINGS: Seven cervical segments are well visualized. Vertebral body height
is well maintained. Mild degenerative anterolisthesis of C3 on C4 is
noted. Disc space narrowing with osteophytic change is seen at C5-6
and C6-7. The odontoid is within normal limits. No soft tissue
changes are noted.
IMPRESSION: Multilevel degenerative change without acute abnormality.

## 2024-02-06 IMAGING — DX DG LUMBAR SPINE 2-3V
3 series · 3 of 3 positions shown · non-contrast
Comparison: None.

CLINICAL DATA: Low back pain radiating into the hips for 1 year,
initial encounter

EXAM:
LUMBAR SPINE - 3 VIEW

[l-spine ap]
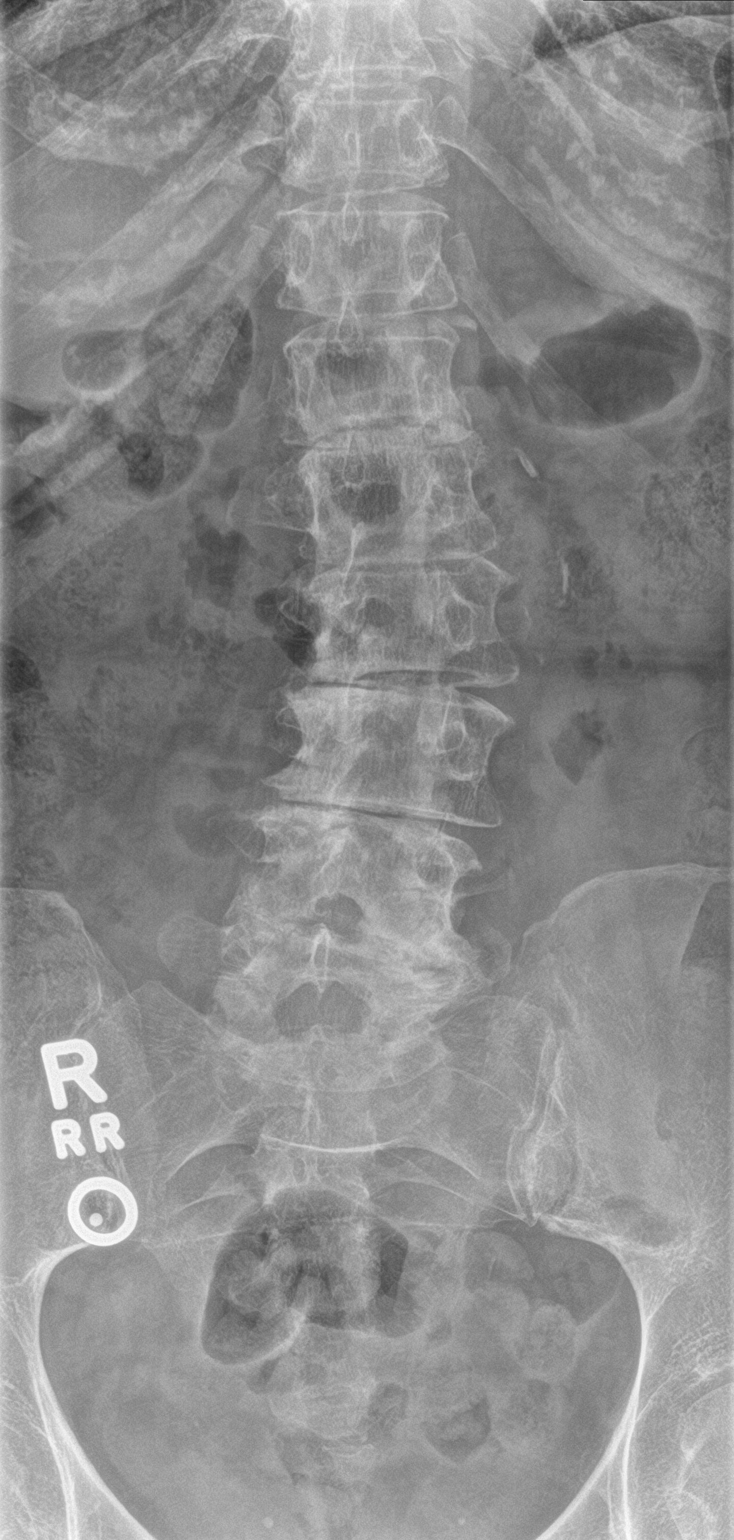

[l-spine lat]
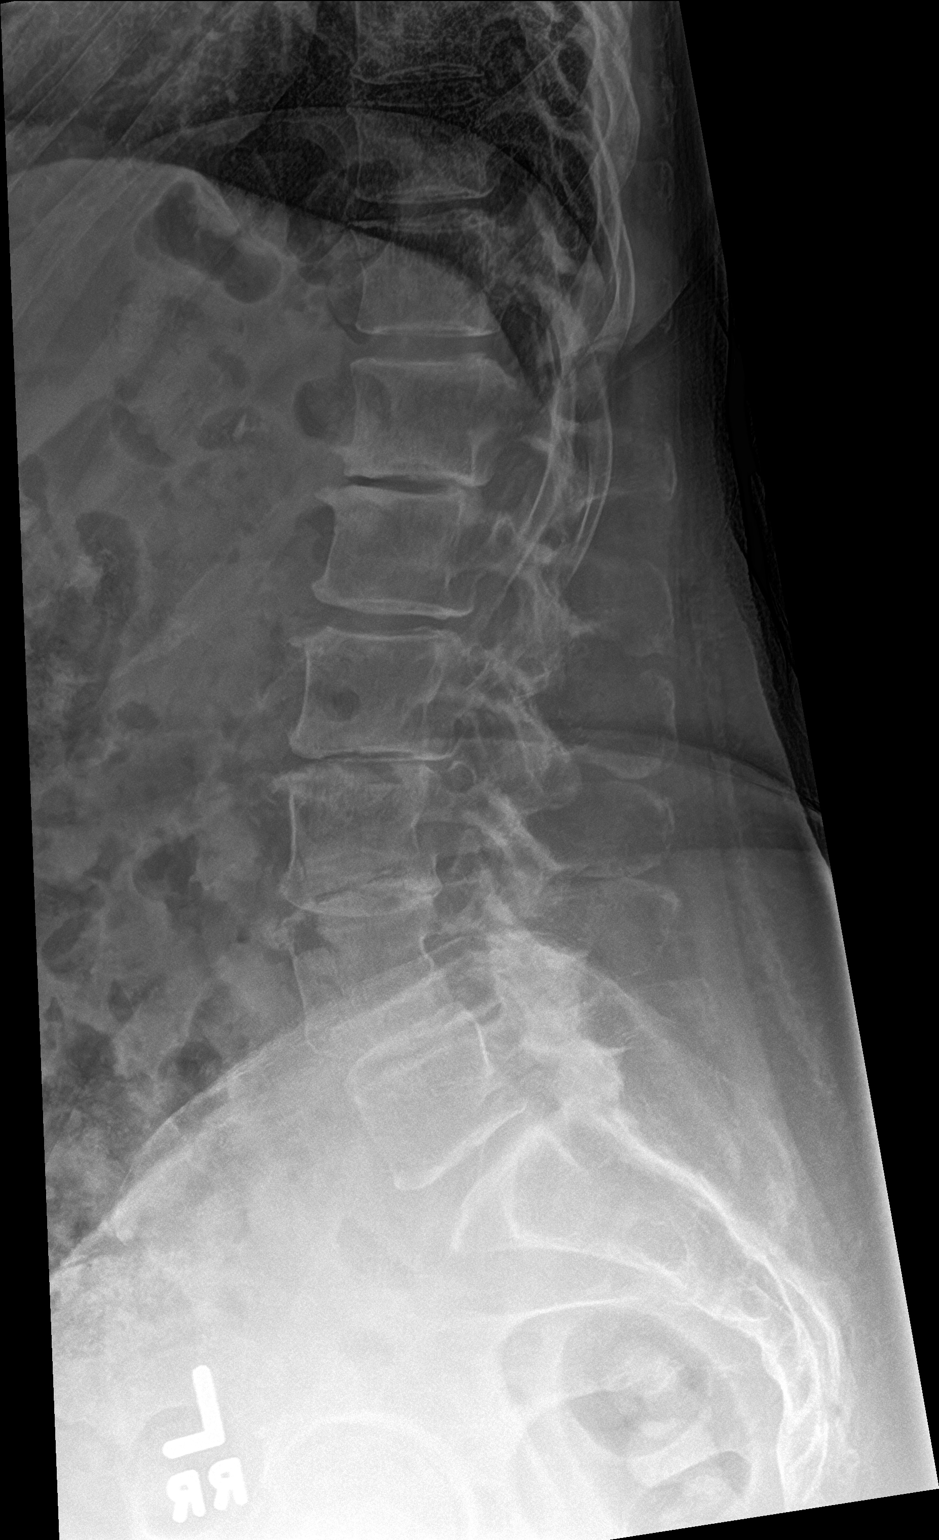

[l-spine spot]
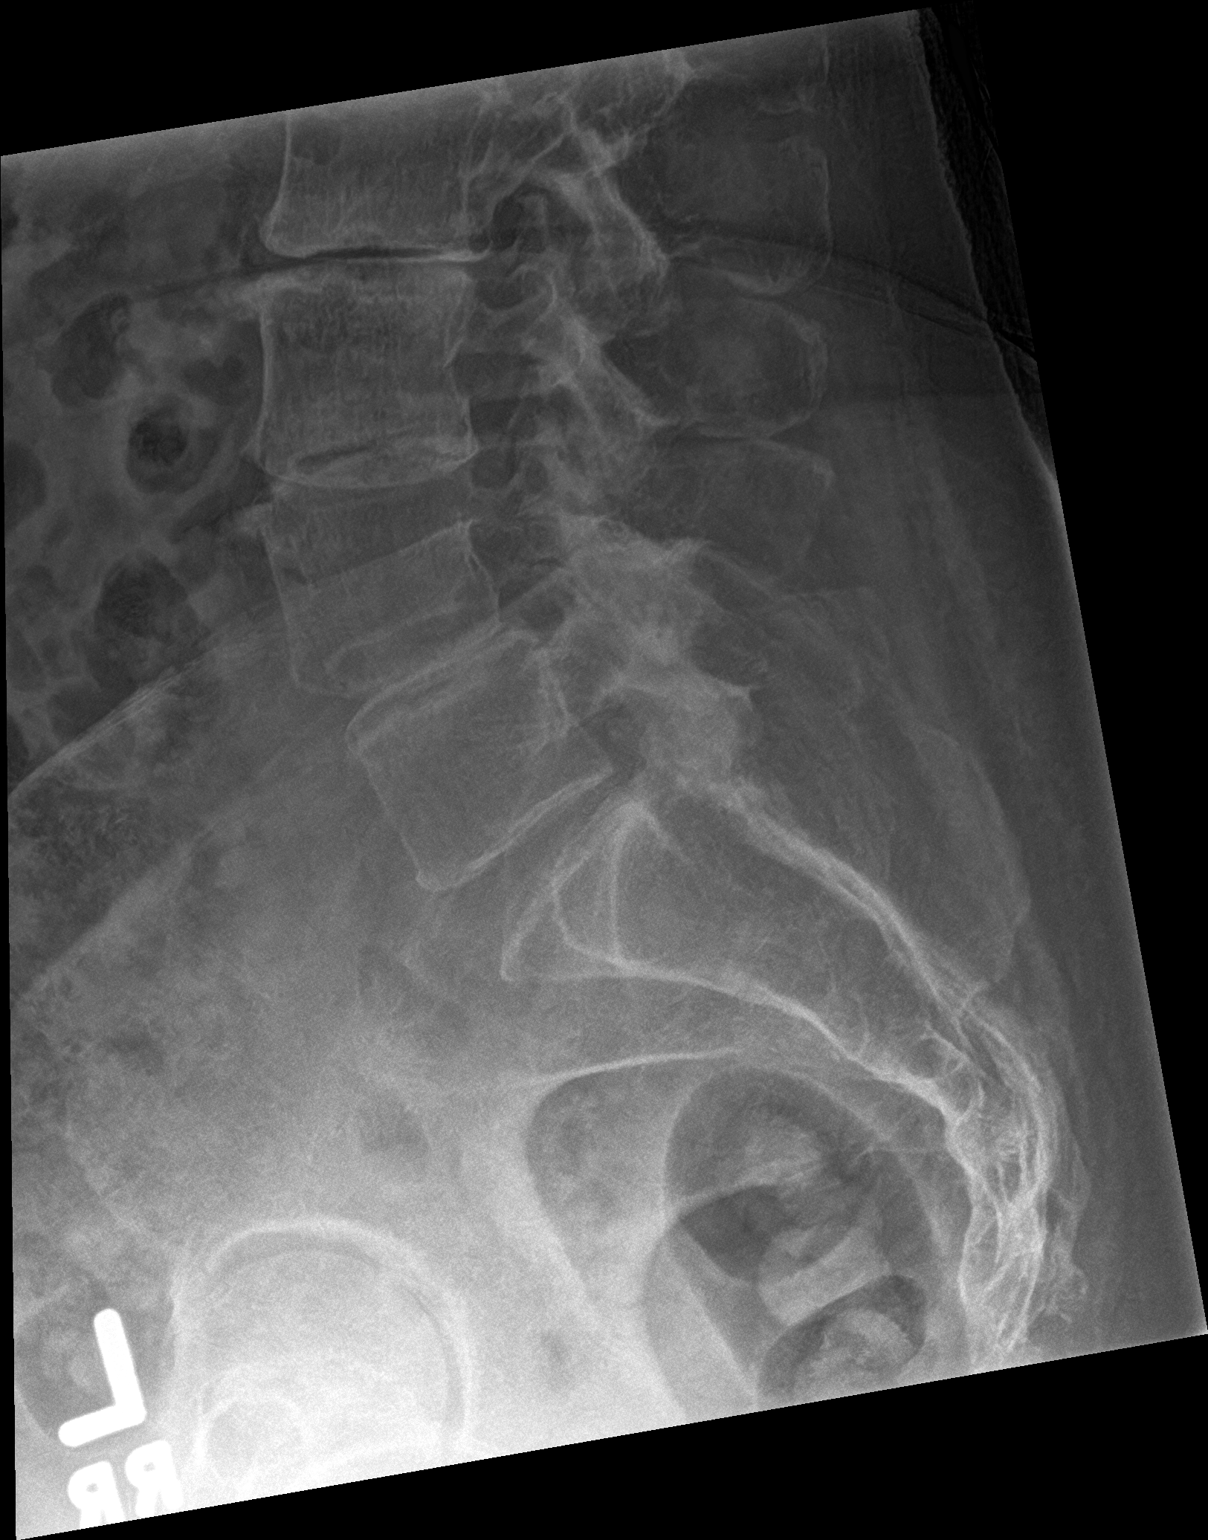

[3 of 3 positions shown; findings below may reference images not displayed]

FINDINGS: Vertebral body height is well maintained. Mild scoliosis concave to
the right is noted centered at L2-3. Mild osteophytic changes and
disc space narrowing is noted. Degenerative anterolisthesis of L4 on
L5 is noted. Multilevel disc space narrowing is seen. No soft tissue
abnormality is noted.
IMPRESSION: Degenerative change without acute abnormality.

## 2024-02-06 NOTE — Transitions of Care (Post Inpatient/ED Visit) (Signed)
   02/06/2024  Name: Cynthia [name] MRN: [id number] DOB: May 27, 1945  Today's TOC FU Call Status: Today's TOC FU Call Status:: Successful TOC FU Call Completed Unsuccessful Call (1st Attempt) Date: 02/05/24 [facility] FU Call Complete Date: 02/06/24 Patient's Name and Date of Birth confirmed.  Transition Care Management Follow-up Telephone Call Date of Discharge: 02/04/24 Discharge Facility: [facility]) Type of Discharge: Inpatient Admission Primary Inpatient Discharge Diagnosis:: hypo-osmolality How have you been since you were released from the hospital?: Better Any questions or concerns?: No  Items Reviewed: Did you receive and understand the discharge instructions provided?: Yes Medications obtained,verified, and reconciled?: Yes (Medications Reviewed) Any new allergies since your discharge?: No Dietary orders reviewed?: Yes Do you have support at home?: No  Medications Reviewed Today: Medications Reviewed Today   Medications were not reviewed in this encounter     Home Care and Equipment/Supplies: Were Home Health Services Ordered?: NA Any new equipment or medical supplies ordered?: NA  Functional Questionnaire: Do you need assistance with bathing/showering or dressing?: No Do you need assistance with meal preparation?: No Do you need assistance with eating?: No Do you have difficulty maintaining continence: No Do you need assistance with getting out of bed/getting out of a chair/moving?: No Do you have difficulty managing or taking your medications?: No  Follow up appointments reviewed: PCP Follow-up appointment confirmed?: Yes Date of PCP follow-up appointment?: 02/06/24 Follow-up Provider: [facility] Follow-up appointment confirmed?: NA Do you need transportation to your follow-up appointment?: No Do you understand care options if your condition(s) worsen?: Yes-patient verbalized understanding    SIGNATURE Julian Lemmings, LPN [facility] Nurse Health  Advisor Direct Dial [phone]

## 2024-02-06 NOTE — Patient Instructions (Addendum)
 Will initiate gradual taper of sertraline  from 200 mg daily to 100 mg daily. Decrease to 150 mg daily (1.5 tablets) for 2 weeks, then to 100 mg daily (1 tablet) for 2 weeks.

## 2024-02-06 NOTE — Assessment & Plan Note (Signed)
 Chronic migraines managed by neurology. Recent severe migraine lasted over 24 hours, leading to emergency department visit. Sumatriptan  effectively relieved a recent migraine episode. - Follow up with neurology to discuss migraine management and potential use of triptans.

## 2024-02-06 NOTE — Assessment & Plan Note (Signed)
 Depression and anxiety managed with sertraline  and bupropion . Current sertraline  dose may contribute to hyponatremia.  - Taper sertraline  by 50 mg every two weeks until reaching 100 mg daily. - Reassess with PCP  to evaluate further adjustments. - Maintain regular therapy sessions every two weeks.

## 2024-02-07 ENCOUNTER — Ambulatory Visit: Payer: Self-pay | Admitting: Student

## 2024-02-07 LAB — COMPREHENSIVE METABOLIC PANEL WITH GFR
ALT: 14 U/L (ref 0–35)
AST: 19 U/L (ref 0–37)
Albumin: 4.4 g/dL (ref 3.5–5.2)
Alkaline Phosphatase: 110 U/L (ref 39–117)
BUN: 16 mg/dL (ref 6–23)
CO2: 28 meq/L (ref 19–32)
Calcium: 9.7 mg/dL (ref 8.4–10.5)
Chloride: 96 meq/L (ref 96–112)
Creatinine, Ser: 0.85 mg/dL (ref 0.40–1.20)
GFR: 65.07 mL/min (ref >60.00)
Glucose, Bld: 82 mg/dL (ref 70–99)
Potassium: 4.3 meq/L (ref 3.5–5.1)
Sodium: 134 meq/L — ABNORMAL LOW (ref 135–145)
Total Bilirubin: 0.4 mg/dL (ref 0.2–1.2)
Total Protein: 6.9 g/dL (ref 6.0–8.3)

## 2024-02-18 DIAGNOSIS — F321 Major depressive disorder, single episode, moderate: Secondary | ICD-10-CM | POA: Insufficient documentation

## 2024-02-19 ENCOUNTER — Ambulatory Visit: Admitting: Internal Medicine

## 2024-02-19 VITALS — BP 126/86 | HR 83 | Temp 98.3°F | Resp 18 | Ht 64.0 in | Wt 171.5 lb

## 2024-02-19 DIAGNOSIS — F419 Anxiety disorder, unspecified: Secondary | ICD-10-CM

## 2024-02-19 DIAGNOSIS — Z79899 Other long term (current) drug therapy: Secondary | ICD-10-CM | POA: Diagnosis not present

## 2024-02-19 DIAGNOSIS — G43809 Other migraine, not intractable, without status migrainosus: Secondary | ICD-10-CM | POA: Diagnosis not present

## 2024-02-19 DIAGNOSIS — F32A Depression, unspecified: Secondary | ICD-10-CM | POA: Diagnosis not present

## 2024-02-19 DIAGNOSIS — E871 Hypo-osmolality and hyponatremia: Secondary | ICD-10-CM | POA: Diagnosis not present

## 2024-02-19 DIAGNOSIS — F321 Major depressive disorder, single episode, moderate: Secondary | ICD-10-CM

## 2024-02-19 LAB — BASIC METABOLIC PANEL WITH GFR
BUN: 13 mg/dL (ref 6–23)
CO2: 28 meq/L (ref 19–32)
Calcium: 9.7 mg/dL (ref 8.4–10.5)
Chloride: 99 meq/L (ref 96–112)
Creatinine, Ser: 0.75 mg/dL (ref 0.40–1.20)
GFR: 75.59 mL/min (ref >60.00)
Glucose, Bld: 73 mg/dL (ref 70–99)
Potassium: 5 meq/L (ref 3.5–5.1)
Sodium: 133 meq/L — ABNORMAL LOW (ref 135–145)

## 2024-02-19 MED ORDER — ONDANSETRON HCL 4 MG PO TABS: 4.0000 mg | ORAL_TABLET | Freq: Three times a day (TID) | ORAL | 0 refills | Status: AC | PRN

## 2024-02-19 NOTE — Assessment & Plan Note (Signed)
 Migraines: Long history of migraines, prior to the recent admission to the hospital she was taking tramadol  as needed, Inderal  twice daily (although it was prescribed 3 times a day), tramadol  as needed.  She also noticed that gabapentin , prescribed for another issue, was helping a great deal. Since summer of this year,  she noted her migraines got more intense and frequent.  Neurology switched her to baclofen . Last MRA of the brain was 12/30/2023 -- stable brain aneurysm. Was eventually admitted to the hospital with headaches and hyponatremia, CT head was negative.  Was treated with Imitrex . At this point she continued with daily morning headaches, described as at least moderate to severe, associated with nausea.  Typically they got better after she drinks coffee and get a Imitrex  but she does not like to get Imitrex  every day. Plan: - Increase Inderal  to 3 times daily (heart rate acceptable today). - Increase gabapentin  300 mg BIT to TID - Cont prn tramadol , initrex - Hold baclofen , apparently is not helping much. - Add Zofran  as needed - sed rate  - Continue working with neurology.  Will send a message to them, restart Emgality ?  (Neurology responded, they will reach out to the patient and see her sooner.  Appreciate their help) Hyponatremia: Felt to be due to high doses of sertraline , the patient however has been on such high doses for years.  Will recheck sodium today. Depression, anxiety, insomnia. PHQ-9 months ago was 3, today is 11. Sertraline  dose reduced to 150 mg every day since she left the hospital  02/04/24. Also on Wellbutrin  and counseling q 3 weeks.  Although PHQ-9 is slightly higher where creating no change for now RTC 3 months

## 2024-02-19 NOTE — Patient Instructions (Addendum)
 GO TO THE LAB :  Get the blood work    Then, go to the front desk for the checkout Please make an appointment for a follow-up in 3 months  Continue sertraline  150 mg daily Increase gabapentin  300 mg to 3 times a day Increase Inderal  to 3 times a day Do not take baclofen  for a month Okay to take tramadol  as needed

## 2024-02-19 NOTE — Progress Notes (Signed)
 Subjective:    Patient ID: Cynthia [name], female    DOB: [birth year], 78 y.o.   MRN: [id number]  DOS:  02/19/2024 Hospital follow-up  Discussed the use of AI scribe software for clinical note transcription with the patient, who gave verbal consent to proceed.  History of Present Illness  Admitted to the hospital and discharged 02/04/2024.  Admitted with migraine, during that admission she was found to be hyponatremic-sodium 124.  Culprit could be the higher dose of sertraline , 200 mg daily.  Was recommended to decrease sertraline  to 150 mg daily. For the migraine she was rec to continue propranolol , got sumatriptan . Was seen at this office 02/06/2024, labs were done, reviewed.  Sodium 134    Currently continued with headaches, - Daily morning headaches, described as severe - Headaches improve after getting out of bed, having coffee, and eating breakfast - Occasionally accompanied by nausea, a recent development - Headaches have worsened since June or July, coinciding with the initiation of baclofen  - MRI in August was stable; three MRIs performed recently - Previously used Emgality  injections for one year with good effect  Medication use and response - Gabapentin  and propranolol  taken twice daily for chronic headaches, though propranolol  is prescribed three times daily - Tramadol  used rarely as needed for headache relief - Baclofen  started recently and used occasionally for muscle relaxation - Headache worsening noted since approximately the time she started baclofen .  Related?  Mood disturbance and energy level - Low energy present - Sertraline  dosage being reduced to 150 mg - Emotional distress, compounded by the recent death of a cousin - Limited physical activity, believed to negatively impact her condition   Review of Systems See above   Past Medical History:  Diagnosis Date   Allergy    Anemia    Anxiety    Arthritis    Blood transfusion without reported diagnosis     Cataract    Constipation, chronic    DEGENERATIVE JOINT DISEASE 06/02/2010   Depression    GERD (gastroesophageal reflux disease)    Glaucoma 05/15/2012   Right eye   Headache(784.0)    Hyperlipidemia    hx of   Osteopenia    Osteoporosis    Psoriasis    PUD (peptic ulcer disease) 05/15/2010   Upper GI bleed d/t NSAIDs   RLS (restless legs syndrome)    robaxin  prn   TIA (transient ischemic attack) 09/2019    Past Surgical History:  Procedure Laterality Date   ANKLE FRACTURE SURGERY Left    surgery 12-11 (LEFT)   CESAREAN SECTION  05/16/1967   COLONOSCOPY  12/17/2013   EYE SURGERY  2020   cataract removal   FRACTURE SURGERY  2012   fracture in foot   JOINT REPLACEMENT  August, 2023   Left knee   TOTAL KNEE ARTHROPLASTY Left 12/28/2021   UPPER GASTROINTESTINAL ENDOSCOPY      Current Outpatient Medications  Medication Instructions   aspirin  EC 81 mg, Daily   baclofen  (LIORESAL ) 10 mg, Oral, 3 times daily PRN   buPROPion  (WELLBUTRIN  XL) 300 mg, Oral, Daily   calcipotriene (DOVONOX) 0.005 % cream 1 Application, 2 times daily   cetirizine (ZYRTEC) 10 mg, Daily at bedtime   cholecalciferol  (VITAMIN D3) 400 Units, Daily   denosumab  (PROLIA ) 60 mg, Every 6 months   ezetimibe  (ZETIA ) 10 mg, Oral, Daily   fluticasone  (FLONASE ) 50 MCG/ACT nasal spray 2 sprays, Each Nare, Daily   gabapentin  (NEURONTIN ) 300 mg, Oral, 3 times daily  Multiple Vitamins-Minerals (ICAPS AREDS 2 PO) 1 capsule, Daily   nystatin  (MYCOSTATIN ) 500,000 Units, Oral, 4 times daily   omeprazole  (PRILOSEC) 40 MG capsule Oral, Daily   ondansetron  (ZOFRAN ) 4 mg, Oral, Every 8 hours PRN   propranolol  (INDERAL ) 10 mg, Oral, 3 times daily   sertraline  (ZOLOFT ) 150 mg, Oral, Daily at bedtime   SUMAtriptan  (IMITREX ) 20 mg, Nasal,  Once, May repeat in 2 hours if headache persists or recurs.  Only 2 doses per 24 hrs   traMADol  (ULTRAM ) 50 mg, Oral, Daily PRN   Travoprost, BAK Free, (TRAVATAN) 0.004 % SOLN  ophthalmic solution 1 drop, Daily at bedtime   triamcinolone  cream (KENALOG ) 0.1 % 1 Application, Daily PRN   zolpidem  (AMBIEN ) 5 mg, Oral, At bedtime PRN       Objective:   Physical Exam BP 126/86   Pulse 83   Temp 98.3 F (36.8 C) (Oral)   Resp 18   Ht 5' 4 (1.626 m)   Wt 171 lb 8 oz (77.8 kg)   SpO2 97%   BMI 29.44 kg/m  General:   Well developed, NAD, BMI noted. HEENT:  Normocephalic . Face symmetric, atraumatic Lungs:  CTA B Normal respiratory effort, no intercostal retractions, no accessory muscle use. Heart: RRR,  no murmur.  Lower extremities: no pretibial edema bilaterally  Skin: Not pale. Not jaundice Neurologic:  alert & oriented X3.  Speech normal, gait appropriate for age and unassisted.  EOMI. Psych--  Cognition and judgment appear intact.  Cooperative with normal attention span and concentration.  Behavior appropriate. No anxious or depressed appearing.      Assessment   Assessment Hyperlipidemia: Lipitor causes myalgias, declined other statins Anxiety depression insomnia DJD Neuro: ---Migraines, chronic: Saw neurology 12/01/2020: Failed verapamil , nortriptyline  and topiramate . Emgality  helping.  Gabapentin  helps.  Tramadol  prn ---TIA: 2021 Admitted to the hospital with left-sided numbness, symptoms of resolved, work-up included: 2D echo normal with grade 1 diastolic dysfunction.  No PFO or thrombus. MRA head no acute, 2 mm anterior communicating artery aneurysm. MRA neck normal LDL 162.  Sodium 133, slightly low.    Neurology notes: TIA versus complicated migraine, recommended aspirin  and Plavix  for 3 weeks, then aspirin . --- Tremors   Osteoporosis: 07-2014 T score of -1.7, T score (November 2021) -2.8: sports medicine Rx Prolia  PUD due to NSAIDs RLS - robaxin   prn Neuropathy: dx sports meds  Glaucoma Chronic constipation Skin psoriasis , Dx LSC (lichen )  ~ 2014 , sees derm   PLAN: Migraines: Long history of migraines, prior to the  recent admission to the hospital she was taking tramadol  as needed, Inderal  twice daily (although it was prescribed 3 times a day), tramadol  as needed.  She also noticed that gabapentin , prescribed for another issue, was helping a great deal. Since summer of this year,  she noted her migraines got more intense and frequent.  Neurology switched her to baclofen . Last MRA of the brain was 12/30/2023 -- stable brain aneurysm. Was eventually admitted to the hospital with headaches and hyponatremia, CT head was negative.  Was treated with Imitrex . At this point she continued with daily morning headaches, described as at least moderate to severe, associated with nausea.  Typically they got better after she drinks coffee and get a Imitrex  but she does not like to get Imitrex  every day. Plan: - Increase Inderal  to 3 times daily (heart rate acceptable today). - Increase gabapentin  300 mg BIT to TID - Cont prn tramadol , initrex - Hold baclofen ,  apparently is not helping much. - Add Zofran  as needed - sed rate  - Continue working with neurology.  Will send a message to them, restart Emgality ?  (Neurology responded, they will reach out to the patient and see her sooner.  Appreciate their help) Hyponatremia: Felt to be due to high doses of sertraline , the patient however has been on such high doses for years.  Will recheck sodium today. Depression, anxiety, insomnia. PHQ-9 months ago was 3, today is 11. Sertraline  dose reduced to 150 mg every day since she left the hospital  02/04/24. Also on Wellbutrin  and counseling q 3 weeks.  Although PHQ-9 is slightly higher where creating no change for now RTC 3 months Time spent 43 minutes due to extensive chart review.  Also I evaluated her for migraines, evaluated recent CT and MRI results and adjust her medication.  I also coordinated her care with neurology.

## 2024-02-21 DIAGNOSIS — Z1231 Encounter for screening mammogram for malignant neoplasm of breast: Secondary | ICD-10-CM

## 2024-02-21 LAB — DRUG MONITORING PANEL 375977 , URINE

## 2024-02-21 LAB — DM TEMPLATE

## 2024-02-25 ENCOUNTER — Ambulatory Visit: Payer: Self-pay | Admitting: Internal Medicine

## 2024-02-28 NOTE — Progress Notes (Deleted)
 NEUROLOGY FOLLOW UP OFFICE NOTE  Cynthia [name] [id number]  Assessment/Plan:   1.  Cervicogenic headache with cervical spondylosis 2.  Migraine without aura, without status migrainosus, not intractable - improved  4.  Transient left hemisensory loss.  TIA vs migraine.  As no prior history, must assume TIA. 5.  Small Acomm aneurysm, stable    1.  Gabapentin  200mg  twice daily; methocarbamol  500mg  three times daily as needed for acute headache/neck pain 2.  Consider repeat MRA brain in 4 years (unless clinically indicated sooner) 3.  Secondary stroke prevention as managed by PCP:  ASA 81mg  daily, LDL goal less than 70, normotensive blood pressure. 4.  Follow up in 6 months.   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  ***  She was hospitalized last month for migraine and hyponatremia with Na 124.  She was given sumatriptan  which helped.  ***.  CT head was unremarkable.    Current NSAIDS:  ASA 81mg  daily Current analgesics:  acetaminophen -caffeine Current triptans:  none Current ergotamine:  no Current anti-emetic: Zofran  Current muscle relaxants:  Robaxin  500mg  TID (rarely) Current anti-anxiolytic:  no Current sleep aide:  Ambien  Current Antihypertensive medications:  verapamil  CR 180mg  Current Antidepressant medications:  Sertraline  100mg , bupropion  300mg  Current Anticonvulsant medications:  gabapentin  200mg  at bedtime Current anti-CGRP: none Current Vitamins/Herbal/Supplements:  B complex, Mg Current Antihistamines/Decongestants:  Mucinex , Astelin  Other therapy: 1 cup black coffee with melatonin 10mg  at bedtime Other medication: Zetia    No subsequent stroke-like  symptoms.   Depression and anxiety: Yes Other pain: Joint pain   HISTORY: Migraines:  She started having headaches infrequently in 2015 but progressively became more frequent in 2016.  Varies, but they are usually left sided, involving the ear, but also may be right sided (involving the sinuses) or in band-like distribution.  It is both throbbing and non-throbbing.  Initially, it is usually 5/10 but severe episodes are 7-8/10.  Sometimes there is nausea.  There is phonophobia.  There is no associated photophobia, visual disturbance or unilateral numbness or weakness.  Initially, it lasts a couple of hours and occuring 15 days out of the month (3 to 4 days severe).  It wakes her up at 4:30 to 5 AM.  She reports sensation of congestion but no runny nose.  CT of sinuses showed opacified right sphenoid sinus and ethmoid air cell.  She was treated a couple of times with Augmentin  and prednisone , which was effective for a while but headaches then returned.  She cannot think of a trigger.  They are worse during the summer.  There are no aggravating or relieving factors.  MRI of brain from 06/10/15 was normal.  Sed Rate from 12/27/15 was 10.    Cervicogenic headache: She notes another headache across the back of her head when she lays down.  She does have neck pain with cervical spondylosis.  Around December 2022, she started experiencing severe paroxysmal ear pain, mostly right ear but also left ear, every other day.  No aural fullness.  No change in tinnitus.  Hearing slightly worse but not significant.  Sometimes jaw pain. Cervical X-ray from 08/23/2021 revealed multilevel degenerative changes with mild degenerative changes with mild degenerative anterolisthesis of C3 on C4  and disc space narrowing with osteophytic changes at C5-6 and C6-7 but the odontoid was within normal limits.  Chiropractic therapy and acupuncture were ineffective.     TIA vs Migraine: She was admitted to [facility] on 09/14/2019  for TIA presenting as transient left sided hemibody numbness (first leg, then arm, then face).  Mostly resolved quickly but lingered for several hours.  CT head showed right internal capsule hypodensity but follow up MRI of brain showed no acute infarct.  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  2D echo showed EF 60-65% with no source of embolus.  LDL was 162 and Hgb A1c was 5.2.  She was started on ASA 81mg  daily and Plavix  75mg  for 3 weeks followed by ASA alone.  The next morning she had a habitual migraine headache except it lasted all day.  She was also started on Zetia  (she has statin intolerance).  Unclear if event was TIA or complicated migraine.   Due to worsening headache, ordered an MRA of head .  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      Past NSAIDS:  ibuprofen  (ulcers) Past analgesics:  Tylenol , tramadol  Past abortive triptans:  sumatriptan  (advised to discontinue due to age) Past abortive ergotamine:  no Past muscle relaxants:  no Past anti-emetic:  no Past antihypertensive medications:  no Past antidepressant medications:  Nortriptyline  50mg  Past anticonvulsant medications:  topiramate  25mg  (concerned about glaucoma) Past anti-CGRP:  Emgality  (effective but no longer covered by insurance) Past vitamins/Herbal/Supplements:  no Past antihistamines/decongestants:  no Other past therapies:  PT of neck  Imaging: 06/29/2022 MRA HEAD WO:  1. 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 2. No intracranial large vessel occlusion or significant stenosis. 08/23/2020 MRI/MRA HEAD WO:  1. No evidence of acute intracranial abnormality. 2. No significant stenosis. 3. Similar size/appearance of an approximately 2 mm anterior communicating artery aneurysm. 09/14/2019 MRI BRAIN/MRA HEAD & NECK:  1. Mild chronic small  vessel disease without acute intracranial abnormality. 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 3. Otherwise normal head and neck MRA.  PAST MEDICAL HISTORY: Past Medical History:  Diagnosis Date   Allergy    Anemia    Anxiety    Arthritis    Blood transfusion without reported diagnosis    Cataract    Constipation, chronic    DEGENERATIVE JOINT DISEASE 06/02/2010   Depression    GERD (gastroesophageal reflux disease)    Glaucoma 05/15/2012   Right eye   Headache(784.0)    Hyperlipidemia    hx of   Osteopenia    Osteoporosis    Psoriasis    PUD (peptic ulcer disease) 05/15/2010   Upper GI bleed d/t NSAIDs   RLS (restless legs syndrome)    robaxin  prn   TIA (transient ischemic attack) 09/2019    MEDICATIONS: Current Outpatient Medications on File Prior to Visit  Medication Sig Dispense Refill   aspirin  EC 81 MG tablet Take 81 mg by mouth daily. Swallow whole.     baclofen  (LIORESAL ) 10 MG tablet Take 1 tablet (10 mg total) by mouth 3 (three) times daily as needed for muscle spasms. 90 each 5   buPROPion  (WELLBUTRIN  XL) 300 MG 24 hr tablet Take 1 tablet (300 mg total) by mouth daily. 90 tablet 1   calcipotriene (DOVONOX) 0.005 % cream Apply 1 Application topically 2 (two) times  daily.     cetirizine (ZYRTEC) 10 MG tablet Take 10 mg by mouth at bedtime.     cholecalciferol  (VITAMIN D3) 10 MCG (400 UNIT) TABS tablet Take 400 Units by mouth daily.     denosumab  (PROLIA ) 60 MG/ML SOSY injection Inject 60 mg into the skin every 6 (six) months.     ezetimibe  (ZETIA ) 10 MG tablet Take 1 tablet (10 mg total) by mouth daily. 90 tablet 1   fluticasone  (FLONASE ) 50 MCG/ACT nasal spray Place 2 sprays into both nostrils daily. (Patient taking differently: Place 2 sprays into both nostrils daily as needed for allergies.) 16 g 6   gabapentin  (NEURONTIN ) 300 MG capsule Take 1 capsule (300 mg total) by mouth 3 (three) times daily.     Multiple Vitamins-Minerals (ICAPS AREDS 2 PO) Take 1  capsule by mouth daily.     nystatin  (MYCOSTATIN ) 100000 UNIT/ML suspension Take 5 mLs (500,000 Units total) by mouth 4 (four) times daily. (Patient taking differently: Take 5 mLs by mouth 4 (four) times daily as needed (mouth break outs).) 120 mL 1   omeprazole  (PRILOSEC) 40 MG capsule TAKE 1 CAPSULE BY MOUTH EVERY DAY 90 capsule 1   ondansetron  (ZOFRAN ) 4 MG tablet Take 1 tablet (4 mg total) by mouth every 8 (eight) hours as needed for nausea or vomiting. 20 tablet 0   propranolol  (INDERAL ) 10 MG tablet Take 1 tablet (10 mg total) by mouth 3 (three) times daily. (Patient taking differently: Take 10 mg by mouth 2 (two) times daily.) 270 tablet 1   sertraline  (ZOLOFT ) 100 MG tablet Take 1.5 tablets (150 mg total) by mouth at bedtime.     SUMAtriptan  (IMITREX ) 20 MG/ACT nasal spray Place 1 spray (20 mg total) into the nose once for 1 dose. May repeat in 2 hours if headache persists or recurs.  Only 2 doses per 24 hrs 18 each 0   traMADol  (ULTRAM ) 50 MG tablet Take 1 tablet (50 mg total) by mouth daily as needed. 30 tablet 0   Travoprost, BAK Free, (TRAVATAN) 0.004 % SOLN ophthalmic solution Place 1 drop into the right eye at bedtime.     triamcinolone  cream (KENALOG ) 0.1 % Apply 1 Application topically daily as needed (rash).     zolpidem  (AMBIEN ) 5 MG tablet TAKE 1 TABLET (5 MG TOTAL) BY MOUTH AT BEDTIME AS NEEDED FOR SLEEP. 30 tablet 2   No current facility-administered medications on file prior to visit.    ALLERGIES: Allergies  Allergen Reactions   Motrin  [Ibuprofen ]     GI bleed   Statins     delibitating pain all over body   Sulfonamide Derivatives     Pt unsure of reaction   Bee Venom Swelling and Rash   Ciprofloxacin Swelling and Rash    FAMILY HISTORY: Family History  Problem Relation Age of Onset   Aneurysm Mother        brain   Celiac disease Brother    Diabetes Maternal Grandfather    Hypertension Neg Hx    Breast cancer Neg Hx    Coronary artery disease Neg Hx     Colon cancer Neg Hx    Esophageal cancer Neg Hx    Stomach cancer Neg Hx    Rectal cancer Neg Hx    Colon polyps Neg Hx       Objective:  *** General: No acute distress.  Patient appears well-groomed.   Head:  Normocephalic/atraumatic Neck:  Supple.  No paraspinal tenderness.  Full range  of motion. Heart:  Regular rate and rhythm. Neuro:  Alert and oriented.  Speech fluent and not dysarthric.  Language intact.  CN II-XII intact.  Bulk and tone normal.  Muscle strength 5/5 throughout.  Sensation to light touch intact.  Deep tendon reflexes 2+ throughout, toes downgoing.  Gait normal.  Romberg negative. ***    Juliene Dunnings, DO  CC: Aloysius Mech, MD

## 2024-02-29 ENCOUNTER — Ambulatory Visit: Admitting: Neurology

## 2024-03-04 ENCOUNTER — Telehealth (HOSPITAL_BASED_OUTPATIENT_CLINIC_OR_DEPARTMENT_OTHER): Payer: Self-pay

## 2024-03-07 ENCOUNTER — Other Ambulatory Visit: Payer: Self-pay | Admitting: Internal Medicine

## 2024-03-07 DIAGNOSIS — K219 Gastro-esophageal reflux disease without esophagitis: Secondary | ICD-10-CM

## 2024-03-07 DIAGNOSIS — R1310 Dysphagia, unspecified: Secondary | ICD-10-CM

## 2024-03-07 DIAGNOSIS — K2980 Duodenitis without bleeding: Secondary | ICD-10-CM

## 2024-03-07 DIAGNOSIS — K259 Gastric ulcer, unspecified as acute or chronic, without hemorrhage or perforation: Secondary | ICD-10-CM

## 2024-03-07 DIAGNOSIS — K209 Esophagitis, unspecified without bleeding: Secondary | ICD-10-CM

## 2024-03-07 DIAGNOSIS — K449 Diaphragmatic hernia without obstruction or gangrene: Secondary | ICD-10-CM

## 2024-03-25 ENCOUNTER — Encounter (HOSPITAL_BASED_OUTPATIENT_CLINIC_OR_DEPARTMENT_OTHER): Payer: Self-pay

## 2024-03-25 ENCOUNTER — Ambulatory Visit (HOSPITAL_BASED_OUTPATIENT_CLINIC_OR_DEPARTMENT_OTHER)
Admission: RE | Admit: 2024-03-25 | Discharge: 2024-03-25 | Disposition: A | Discharge status: Home / Self Care | Source: Ambulatory Visit | Attending: Internal Medicine | Admitting: Internal Medicine

## 2024-03-25 DIAGNOSIS — Z1231 Encounter for screening mammogram for malignant neoplasm of breast: Secondary | ICD-10-CM | POA: Diagnosis not present

## 2024-03-27 ENCOUNTER — Ambulatory Visit: Admitting: Neurology

## 2024-04-01 DIAGNOSIS — H401131 Primary open-angle glaucoma, bilateral, mild stage: Secondary | ICD-10-CM | POA: Diagnosis not present

## 2024-04-01 DIAGNOSIS — H353132 Nonexudative age-related macular degeneration, bilateral, intermediate dry stage: Secondary | ICD-10-CM | POA: Diagnosis not present

## 2024-04-01 DIAGNOSIS — H04123 Dry eye syndrome of bilateral lacrimal glands: Secondary | ICD-10-CM | POA: Diagnosis not present

## 2024-04-01 NOTE — Progress Notes (Unsigned)
 NEUROLOGY FOLLOW UP OFFICE NOTE  Cynthia [name] [id number]  Assessment/Plan:   1.  Cervicogenic headache with cervical spondylosis 2.  Migraine without aura, without status migrainosus, not intractable - improved  4.  Transient left hemisensory loss.  TIA vs migraine.  As no prior history, must assume TIA. 5.  Small Acomm aneurysm, stable    1.  Gabapentin  200mg  twice daily; methocarbamol  500mg  three times daily as needed for acute headache/neck pain 2.  Secondary stroke prevention as managed by PCP:  ASA 81mg  daily, LDL goal less than 70, normotensive blood pressure. 4.  Follow up in 6 months.   Subjective:  Cynthia [name] is a 78 year old right-handed woman with depression, glaucoma, degenerative joint disease and history of TIA who follows up for headaches.  MRA head personally reviewed.    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    ***  Current NSAIDS:  ASA 81mg  daily Current analgesics:  acetaminophen -caffeine Current triptans:  none Current ergotamine:  no Current anti-emetic: Zofran  Current muscle relaxants:  Robaxin  500mg  TID PRN Current anti-anxiolytic:  no Current sleep aide:  Ambien  Current Antihypertensive medications:  verapamil  CR 180mg  Current Antidepressant medications:  Sertraline  100mg , bupropion  300mg  Current Anticonvulsant medications:  gabapentin  200mg  at bedtime Current anti-CGRP: none Current Vitamins/Herbal/Supplements:  B complex, Mg Current Antihistamines/Decongestants:  Mucinex , Astelin  Other therapy: 1 cup black coffee with melatonin 10mg  at bedtime Other medication: Zetia    No subsequent stroke-like symptoms.   Depression and anxiety: Yes Other pain: Joint pain   HISTORY: Migraines:  She started having headaches infrequently in 2015 but progressively became more frequent in 2016.  Varies, but they are  usually left sided, involving the ear, but also may be right sided (involving the sinuses) or in band-like distribution.  It is both throbbing and non-throbbing.  Initially, it is usually 5/10 but severe episodes are 7-8/10.  Sometimes there is nausea.  There is phonophobia.  There is no associated photophobia, visual disturbance or unilateral numbness or weakness.  Initially, it lasts a couple of hours and occuring 15 days out of the month (3 to 4 days severe).  It wakes her up at 4:30 to 5 AM.  She reports sensation of congestion but no runny nose.  CT of sinuses showed opacified right sphenoid sinus and ethmoid air cell.  She was treated a couple of times with Augmentin  and prednisone , which was effective for a while but headaches then returned.  She cannot think of a trigger.  They are worse during the summer.  There are no aggravating or relieving factors.  MRI of brain from 06/10/15 was normal.  Sed Rate from 12/27/15 was 10.    Cervicogenic headache: She notes another headache across the back of her head when she lays down.  She does have neck pain with cervical spondylosis.  Around December 2022, she started experiencing severe paroxysmal ear pain, mostly right ear but also left ear, every other day.  No aural fullness.  No change in tinnitus.  Hearing slightly worse but not significant.  Sometimes jaw pain. Cervical X-ray from 08/23/2021 revealed multilevel degenerative changes with mild degenerative changes with mild degenerative anterolisthesis of C3 on C4 and disc space narrowing with osteophytic changes at C5-6 and C6-7 but the odontoid was within normal limits.  Chiropractic therapy and acupuncture were ineffective.     TIA vs Migraine: She was admitted to [facility] on 09/14/2019 for TIA presenting as transient left sided hemibody numbness (first leg, then arm, then face).  Mostly resolved quickly but lingered for several hours.  CT head showed right internal capsule hypodensity but follow  up MRI of brain showed no acute infarct.  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  2D echo showed EF 60-65% with no source of embolus.  LDL was 162 and Hgb A1c was 5.2.  She was started on ASA 81mg  daily and Plavix  75mg  for 3 weeks followed by ASA alone.  The next morning she had a habitual migraine headache except it lasted all day.  She was also started on Zetia  (she has statin intolerance).  Unclear if event was TIA or complicated migraine.   Due to worsening headache, ordered an MRA of head .  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      Past NSAIDS:  ibuprofen  (ulcers) Past analgesics:  Tylenol , tramadol  Past abortive triptans:  sumatriptan  (advised to discontinue due to age) Past abortive ergotamine:  no Past muscle relaxants:  no Past anti-emetic:  no Past antihypertensive medications:  no Past antidepressant medications:  Nortriptyline  50mg  Past anticonvulsant medications:  topiramate  25mg  (concerned about glaucoma) Past anti-CGRP:  Emgality  (effective but no longer covered by insurance) Past vitamins/Herbal/Supplements:  no Past antihistamines/decongestants:  no Other past therapies:  PT of neck  Imaging: 06/29/2022 MRA HEAD WO:  1. 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 2. No intracranial large vessel occlusion or significant stenosis. 08/23/2020 MRI/MRA HEAD WO:  1. No evidence of acute intracranial abnormality. 2. No significant stenosis. 3. Similar size/appearance of an approximately 2 mm anterior communicating artery aneurysm. 09/14/2019 MRI BRAIN/MRA HEAD & NECK:  1. Mild chronic small vessel disease without acute intracranial abnormality. 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 3. Otherwise normal head and neck MRA.  PAST MEDICAL HISTORY: Past Medical History:  Diagnosis Date   Allergy     Anemia    Anxiety    Arthritis    Blood transfusion without reported diagnosis    Cataract    Constipation, chronic    DEGENERATIVE JOINT DISEASE 06/02/2010   Depression    GERD (gastroesophageal reflux disease)    Glaucoma 05/15/2012   Right eye   Headache(784.0)    Hyperlipidemia    hx of   Osteopenia    Osteoporosis    Psoriasis    PUD (peptic ulcer disease) 05/15/2010   Upper GI bleed d/t NSAIDs   RLS (restless legs syndrome)    robaxin  prn   TIA (transient ischemic attack) 09/2019    MEDICATIONS: Current Outpatient Medications on File Prior to Visit  Medication Sig Dispense Refill   aspirin  EC 81 MG tablet Take 81 mg by mouth daily. Swallow whole.     baclofen  (LIORESAL ) 10 MG tablet Take 1 tablet (10 mg total) by mouth 3 (three) times daily as needed for muscle spasms. 90 each 5   buPROPion  (WELLBUTRIN  XL) 300 MG 24 hr tablet Take 1 tablet (300 mg total) by mouth daily. 90 tablet 1   calcipotriene (DOVONOX) 0.005 % cream Apply 1 Application topically 2 (two) times daily.     cetirizine (ZYRTEC) 10 MG tablet Take 10 mg by mouth at bedtime.     cholecalciferol  (VITAMIN D3) 10 MCG (400 UNIT) TABS tablet Take 400 Units by mouth daily.  denosumab  (PROLIA ) 60 MG/ML SOSY injection Inject 60 mg into the skin every 6 (six) months.     ezetimibe  (ZETIA ) 10 MG tablet Take 1 tablet (10 mg total) by mouth daily. 90 tablet 1   fluticasone  (FLONASE ) 50 MCG/ACT nasal spray Place 2 sprays into both nostrils daily. (Patient taking differently: Place 2 sprays into both nostrils daily as needed for allergies.) 16 g 6   gabapentin  (NEURONTIN ) 300 MG capsule Take 1 capsule (300 mg total) by mouth 3 (three) times daily.     Multiple Vitamins-Minerals (ICAPS AREDS 2 PO) Take 1 capsule by mouth daily.     nystatin  (MYCOSTATIN ) 100000 UNIT/ML suspension Take 5 mLs (500,000 Units total) by mouth 4 (four) times daily. (Patient taking differently: Take 5 mLs by mouth 4 (four) times daily as  needed (mouth break outs).) 120 mL 1   omeprazole  (PRILOSEC) 40 MG capsule TAKE 1 CAPSULE BY MOUTH EVERY DAY 90 capsule 1   ondansetron  (ZOFRAN ) 4 MG tablet Take 1 tablet (4 mg total) by mouth every 8 (eight) hours as needed for nausea or vomiting. 20 tablet 0   propranolol  (INDERAL ) 10 MG tablet Take 1 tablet (10 mg total) by mouth 3 (three) times daily. (Patient taking differently: Take 10 mg by mouth 2 (two) times daily.) 270 tablet 1   sertraline  (ZOLOFT ) 100 MG tablet Take 1.5 tablets (150 mg total) by mouth at bedtime.     SUMAtriptan  (IMITREX ) 20 MG/ACT nasal spray Place 1 spray (20 mg total) into the nose once for 1 dose. May repeat in 2 hours if headache persists or recurs.  Only 2 doses per 24 hrs 18 each 0   traMADol  (ULTRAM ) 50 MG tablet Take 1 tablet (50 mg total) by mouth daily as needed. 30 tablet 0   Travoprost, BAK Free, (TRAVATAN) 0.004 % SOLN ophthalmic solution Place 1 drop into the right eye at bedtime.     triamcinolone  cream (KENALOG ) 0.1 % Apply 1 Application topically daily as needed (rash).     zolpidem  (AMBIEN ) 5 MG tablet TAKE 1 TABLET (5 MG TOTAL) BY MOUTH AT BEDTIME AS NEEDED FOR SLEEP. 30 tablet 2   No current facility-administered medications on file prior to visit.    ALLERGIES: Allergies  Allergen Reactions   Motrin  [Ibuprofen ]     GI bleed   Statins     delibitating pain all over body   Sulfonamide Derivatives     Pt unsure of reaction   Bee Venom Swelling and Rash   Ciprofloxacin Swelling and Rash    FAMILY HISTORY: Family History  Problem Relation Age of Onset   Aneurysm Mother        brain   Celiac disease Brother    Diabetes Maternal Grandfather    Hypertension Neg Hx    Breast cancer Neg Hx    Coronary artery disease Neg Hx    Colon cancer Neg Hx    Esophageal cancer Neg Hx    Stomach cancer Neg Hx    Rectal cancer Neg Hx    Colon polyps Neg Hx       Objective:  *** General: No acute distress.  Patient appears well-groomed.    Head:  Normocephalic/atraumatic Neck:  Supple.  No paraspinal tenderness.  Full range of motion. Heart:  Regular rate and rhythm. Neuro:  Alert and oriented.  Speech fluent and not dysarthric.  Language intact.  CN II-XII intact.  Bulk and tone normal.  Muscle strength 5/5 throughout.  Sensation to light  touch intact.  Deep tendon reflexes 2+ throughout, toes downgoing.  Gait normal.  Romberg negative.    Juliene Dunnings, DO  CC: Aloysius Mech, MD

## 2024-04-02 ENCOUNTER — Encounter: Payer: Self-pay | Admitting: Neurology

## 2024-04-02 ENCOUNTER — Other Ambulatory Visit: Payer: Self-pay | Admitting: Internal Medicine

## 2024-04-02 ENCOUNTER — Ambulatory Visit: Admitting: Neurology

## 2024-04-02 VITALS — BP 139/65 | HR 78 | Ht 64.0 in | Wt 171.6 lb

## 2024-04-02 DIAGNOSIS — M47812 Spondylosis without myelopathy or radiculopathy, cervical region: Secondary | ICD-10-CM | POA: Diagnosis not present

## 2024-04-02 DIAGNOSIS — G43709 Chronic migraine without aura, not intractable, without status migrainosus: Secondary | ICD-10-CM | POA: Diagnosis not present

## 2024-04-02 NOTE — Patient Instructions (Signed)
 Plan to start Botox Stop  sumatriptan .  Try Holland at earliest onset.  May repeat after 2 hours.  Maximum 2 tablets in 24 hours.  Let me know if it works.  Otherwise take tramadol  with baclofen  Limit use of pain relievers to no more than 9 days out of the month to prevent risk of rebound or medication-overuse headache.

## 2024-04-02 NOTE — Progress Notes (Signed)
 Medication Samples have been provided to the patient.  Drug name: Holland       Strength: 100 mg        Qty: 4  LOT: [id number]  Exp.Date: 4/28  Dosing instructions: as needed  The patient has been instructed regarding the correct time, dose, and frequency of taking this medication, including desired effects and most common side effects.   Cynthia [name] 11:07 AM 04/02/2024

## 2024-04-03 ENCOUNTER — Ambulatory Visit: Admitting: Nurse Practitioner

## 2024-04-03 ENCOUNTER — Ambulatory Visit: Payer: Self-pay

## 2024-04-03 VITALS — BP 126/74 | HR 70 | Temp 98.4°F | Ht 64.0 in | Wt 169.6 lb

## 2024-04-03 DIAGNOSIS — N3000 Acute cystitis without hematuria: Secondary | ICD-10-CM

## 2024-04-03 LAB — POC URINALSYSI DIPSTICK (AUTOMATED)
Bilirubin, UA: NEGATIVE
Glucose, UA: NEGATIVE
Ketones, UA: NEGATIVE
Nitrite, UA: NEGATIVE
Protein, UA: POSITIVE — AB
Spec Grav, UA: 1.015 (ref 1.010–1.025)
Urobilinogen, UA: NEGATIVE U/dL — AB
pH, UA: 6.5 (ref 5.0–8.0)

## 2024-04-03 MED ORDER — NITROFURANTOIN MONOHYD MACRO 100 MG PO CAPS: 100.0000 mg | ORAL_CAPSULE | Freq: Two times a day (BID) | ORAL | 0 refills | Status: AC

## 2024-04-03 NOTE — Patient Instructions (Addendum)
 With history of hyponatremia, do not drink more than 60oz of water daily. Ok to use 1 pack of electrolyte daily. F/up with Dr. Amon about zoloft  dose.  Start macrobid  100mg  BID x 1week F/up with Dr, Amon if no improvement in 1week  Constipation, Adult Constipation is when a person has trouble pooping (having a bowel movement). When you have this condition, you may poop fewer than 3 times a week. Your poop (stool) may also be dry, hard, or bigger than normal. Follow these instructions at home: Eating and drinking  Eat foods that have a lot of fiber, such as: Fresh fruits and vegetables. Whole grains. Beans. Eat less of foods that are low in fiber and high in fat and sugar, such as: French fries. Hamburgers. Cookies. Candy. Soda. Drink enough fluid to keep your pee (urine) pale yellow. General instructions Exercise regularly or as told by your doctor. Try to do 150 minutes of exercise each week. Go to the restroom when you feel like you need to poop. Do not hold it in. Take over-the-counter and prescription medicines only as told by your doctor. These include any fiber supplements. When you poop: Do deep breathing while relaxing your lower belly (abdomen). Relax your pelvic floor. The pelvic floor is a group of muscles that support the rectum, bladder, and intestines (as well as the uterus in women). Watch your condition for any changes. Tell your doctor if you notice any. Keep all follow-up visits as told by your doctor. This is important. Contact a doctor if: You have pain that gets worse. You have a fever. You have not pooped for 4 days. You vomit. You are not hungry. You lose weight. You are bleeding from the opening of the butt (anus). You have thin, pencil-like poop. Get help right away if: You have a fever, and your symptoms suddenly get worse. You leak poop or have blood in your poop. Your belly feels hard or bigger than normal (bloated). You have very bad belly  pain. You feel dizzy or you faint. Summary Constipation is when a person poops fewer than 3 times a week, has trouble pooping, or has poop that is dry, hard, or bigger than normal. Eat foods that have a lot of fiber. Drink enough fluid to keep your pee (urine) pale yellow. Take over-the-counter and prescription medicines only as told by your doctor. These include any fiber supplements. This information is not intended to replace advice given to you by your health care provider. Make sure you discuss any questions you have with your health care provider. Document Revised: 03/15/2022 Document Reviewed: 03/15/2022 Elsevier Patient Education  2024 [employer].

## 2024-04-03 NOTE — Progress Notes (Signed)
 Acute Office Visit  Subjective:    Patient ID: Cynthia [name], female    DOB: [birth year], 78 y.o.   MRN: [id number]  Chief Complaint  Patient presents with   Dysuria    Intermittent pain with urination started yesterday some pelvic pain believes symptoms started days prior     Dysuria  This is a new problem. The current episode started yesterday. The problem occurs every urination. The problem has been unchanged. The quality of the pain is described as burning. The pain is moderate. There has been no fever. She is Not sexually active. There is No history of pyelonephritis. Associated symptoms include frequency and urgency. Pertinent negatives include no chills, discharge, flank pain, hematuria, hesitancy, nausea, possible pregnancy, sweats or vomiting. She has tried nothing for the symptoms. There is no history of catheterization, kidney stones, recurrent UTIs, a single kidney, urinary stasis or a urological procedure.  Hx of constipation  Outpatient Medications Prior to Visit  Medication Sig   baclofen  (LIORESAL ) 10 MG tablet Take 1 tablet (10 mg total) by mouth 3 (three) times daily as needed for muscle spasms.   buPROPion  (WELLBUTRIN  XL) 300 MG 24 hr tablet Take 1 tablet (300 mg total) by mouth daily.   calcipotriene (DOVONOX) 0.005 % cream Apply 1 Application topically 2 (two) times daily.   cetirizine (ZYRTEC) 10 MG tablet Take 10 mg by mouth at bedtime.   cholecalciferol  (VITAMIN D3) 10 MCG (400 UNIT) TABS tablet Take 400 Units by mouth daily.   denosumab  (PROLIA ) 60 MG/ML SOSY injection Inject 60 mg into the skin every 6 (six) months.   ezetimibe  (ZETIA ) 10 MG tablet Take 1 tablet (10 mg total) by mouth daily.   fluticasone  (FLONASE ) 50 MCG/ACT nasal spray Place 2 sprays into both nostrils daily.   gabapentin  (NEURONTIN ) 300 MG capsule Take 1 capsule (300 mg total) by mouth 3 (three) times daily.   Multiple Vitamins-Minerals (ICAPS AREDS 2 PO) Take 1 capsule by mouth daily.    nystatin  (MYCOSTATIN ) 100000 UNIT/ML suspension Take 5 mLs (500,000 Units total) by mouth 4 (four) times daily.   omeprazole  (PRILOSEC) 40 MG capsule TAKE 1 CAPSULE BY MOUTH EVERY DAY   ondansetron  (ZOFRAN ) 4 MG tablet Take 1 tablet (4 mg total) by mouth every 8 (eight) hours as needed for nausea or vomiting.   propranolol  (INDERAL ) 10 MG tablet Take 1 tablet (10 mg total) by mouth 3 (three) times daily.   sertraline  (ZOLOFT ) 100 MG tablet Take 1.5 tablets (150 mg total) by mouth at bedtime.   traMADol  (ULTRAM ) 50 MG tablet Take 1 tablet (50 mg total) by mouth daily as needed.   Travoprost, BAK Free, (TRAVATAN) 0.004 % SOLN ophthalmic solution Place 1 drop into the right eye at bedtime.   triamcinolone  cream (KENALOG ) 0.1 % Apply 1 Application topically daily as needed (rash).   zolpidem  (AMBIEN ) 5 MG tablet TAKE 1 TABLET (5 MG TOTAL) BY MOUTH AT BEDTIME AS NEEDED FOR SLEEP.   [DISCONTINUED] aspirin  EC 81 MG tablet Take 81 mg by mouth daily. Swallow whole. (Patient not taking: Reported on 04/02/2024)   No facility-administered medications prior to visit.   Reviewed past medical and social history.  Review of Systems  Constitutional:  Negative for chills.  Gastrointestinal:  Negative for nausea and vomiting.  Genitourinary:  Positive for dysuria, frequency and urgency. Negative for flank pain, hematuria and hesitancy.   Per HPI     Objective:    Physical Exam Vitals and nursing note reviewed.  Cardiovascular:     Rate and Rhythm: Normal rate.     Pulses: Normal pulses.  Pulmonary:     Effort: Pulmonary effort is normal.  Abdominal:     General: There is no distension.     Palpations: Abdomen is soft.     Tenderness: There is abdominal tenderness in the suprapubic area. There is no right CVA tenderness, left CVA tenderness or guarding.  Neurological:     Mental Status: She is alert and oriented to person, place, and time.     BP 126/74 (BP Location: Left Arm, Patient Position:  Sitting, Cuff Size: Large)   Pulse 70   Temp 98.4 F (36.9 C) (Oral)   Ht 5' 4 (1.626 m)   Wt 169 lb 9.6 oz (76.9 kg)   SpO2 97%   BMI 29.11 kg/m    Results for orders placed or performed in visit on 04/03/24  POCT Urinalysis Dipstick (Automated)  Result Value Ref Range   Color, UA Yellow    Clarity, UA Cloudy    Glucose, UA Negative Negative   Bilirubin, UA Negative    Ketones, UA Negative    Spec Grav, UA 1.015 1.010 - 1.025   Blood, UA 2+    pH, UA 6.5 5.0 - 8.0   Protein, UA Positive (A) Negative   Urobilinogen, UA negative (A) 0.2 or 1.0 E.U./dL   Nitrite, UA Negative    Leukocytes, UA Large (3+) (A) Negative      Assessment & Plan:   Problem List Items Addressed This Visit   None Visit Diagnoses       Acute cystitis without hematuria    -  Primary   Relevant Medications   nitrofurantoin, macrocrystal-monohydrate, (MACROBID) 100 MG capsule   Other Relevant Orders   POCT Urinalysis Dipstick (Automated) (Completed)      Meds ordered this encounter  Medications   nitrofurantoin, macrocrystal-monohydrate, (MACROBID) 100 MG capsule    Sig: Take 1 capsule (100 mg total) by mouth 2 (two) times daily for 7 days.    Dispense:  14 capsule    Refill:  0    Supervising Provider:   BERNETA FALLOW ALFRED [[id number]]   Advised to Start macrobid 100mg  BID x 1week, use miralax daily to prevent constipation and increase dietary fiber, limited or oral hydration with water due to hx of hyponatremia. F/up with Dr, Amon if no improvement in 1week  No follow-ups on file.    Roselie Mood, NP

## 2024-04-03 NOTE — Telephone Encounter (Signed)
 FYI Only or Action Required?: FYI only for provider: appointment scheduled on today at [facility] .  Patient was last seen in primary care on 02/19/2024 by Amon Aloysius BRAVO, MD.  Called Nurse Triage reporting Dysuria.  Symptoms began several days ago.  Interventions attempted: Rest, hydration, or home remedies.  Symptoms are: gradually worsening.  Triage Disposition: See Physician Within 24 Hours  Patient/caregiver understands and will follow disposition?: Yes, will follow disposition  Copied from CRM #[id number]. Topic: Clinical - Red Word Triage >> Apr 03, 2024  8:35 AM Robinson H wrote: [facility] that prompted transfer to Nurse Triage: Burning and pain while urinating, under pants are having a brown stain thinks it's a discharge from vagina Reason for Disposition  Unusual vaginal discharge (e.g., bad smelling, yellow, green, or foamy-white)  Answer Assessment - Initial Assessment Questions 1. SEVERITY: How bad is the pain?  (e.g., Scale 1-10; mild, moderate, or severe)     Bad, states occurs when she urinates and continues post void 2. FREQUENCY: How many times have you had painful urination today?      Each time she voids 3. PATTERN: Is pain present every time you urinate or just sometimes?      every time 4. ONSET: When did the painful urination start?      Yesterday pain started, pt states she has had discharge and reluctance to urinate has been ongoing for about 4 days 5. FEVER: Do you have a fever? If Yes, ask: What is your temperature, how was it measured, and when did it start?     denies 6. PAST UTI: Have you had a urine infection before? If Yes, ask: When was the last time? and What happened that time?      When younger 48. CAUSE: What do you think is causing the painful urination?  (e.g., UTI, scratch, Herpes sore)     UTI 8. OTHER SYMPTOMS: Do you have any other symptoms? (e.g., blood in urine, flank pain, genital sores, urgency, vaginal  discharge)     Denies flank pain, denies cloudy urine, states there is some odor to her urine.  Protocols used: Urination Pain - Female-A-AH

## 2024-05-20 ENCOUNTER — Other Ambulatory Visit: Payer: Self-pay | Admitting: Internal Medicine

## 2024-05-21 ENCOUNTER — Encounter: Payer: Self-pay | Admitting: Internal Medicine

## 2024-05-21 ENCOUNTER — Ambulatory Visit: Admitting: Internal Medicine

## 2024-05-21 VITALS — BP 126/76 | HR 86 | Temp 98.2°F | Resp 18 | Ht 64.0 in | Wt 171.4 lb

## 2024-05-21 DIAGNOSIS — E871 Hypo-osmolality and hyponatremia: Secondary | ICD-10-CM

## 2024-05-21 DIAGNOSIS — R296 Repeated falls: Secondary | ICD-10-CM | POA: Diagnosis not present

## 2024-05-21 DIAGNOSIS — G4700 Insomnia, unspecified: Secondary | ICD-10-CM | POA: Diagnosis not present

## 2024-05-21 DIAGNOSIS — R400 Somnolence: Secondary | ICD-10-CM | POA: Diagnosis not present

## 2024-05-21 DIAGNOSIS — F321 Major depressive disorder, single episode, moderate: Secondary | ICD-10-CM

## 2024-05-21 DIAGNOSIS — Z23 Encounter for immunization: Secondary | ICD-10-CM

## 2024-05-21 DIAGNOSIS — F419 Anxiety disorder, unspecified: Secondary | ICD-10-CM

## 2024-05-21 DIAGNOSIS — G43809 Other migraine, not intractable, without status migrainosus: Secondary | ICD-10-CM | POA: Diagnosis not present

## 2024-05-21 NOTE — Patient Instructions (Signed)
 Please read your instructions carefully.   Go to the front desk for the checkout Please make an appointment for a follow-up in 4 months  For 2 weeks avoid completely: Baclofen , tramadol  and Ambien .  See if that makes a difference  Fall Prevention in the Home, Adult Falls can cause injuries and affect people of all ages. There are many simple things that you can do to make your home safe and to help prevent falls. If you need it, ask for help making these changes. What actions can I take to prevent falls? General information Use good lighting in all rooms. Make sure to: Replace any light bulbs that burn out. Turn on lights if it is dark and use night-lights. Keep items that you use often in easy-to-reach places. Lower the shelves around your home if needed. Move furniture so that there are clear paths around it. Do not keep throw rugs or other things on the floor that can make you trip. If any of your floors are uneven, fix them. Add color or contrast paint or tape to clearly mark and help you see: Grab bars or handrails. First and last steps of staircases. Where the edge of each step is. If you use a ladder or stepladder: Make sure that it is fully opened. Do not climb a closed ladder. Make sure the sides of the ladder are locked in place. Have someone hold the ladder while you use it. Know where your pets are as you move through your home. What can I do in the bathroom?     Keep the floor dry. Clean up any water that is on the floor right away. Remove soap buildup in the bathtub or shower. Buildup makes bathtubs and showers slippery. Use non-skid mats or decals on the floor of the bathtub or shower. Attach bath mats securely with double-sided, non-slip rug tape. If you need to sit down while you are in the shower, use a non-slip stool. Install grab bars by the toilet and in the bathtub and shower. Do not use towel bars as grab bars. What can I do in the bedroom? Make sure  that you have a light by your bed that is easy to reach. Do not use any sheets or blankets on your bed that hang to the floor. Have a firm bench or chair with side arms that you can use for support when you get dressed. What can I do in the kitchen? Clean up any spills right away. If you need to reach something above you, use a sturdy step stool that has a grab bar. Keep electrical cables out of the way. Do not use floor polish or wax that makes floors slippery. What can I do with my stairs? Do not leave anything on the stairs. Make sure that you have a light switch at the top and the bottom of the stairs. Have them installed if you do not have them. Make sure that there are handrails on both sides of the stairs. Fix handrails that are broken or loose. Make sure that handrails are as long as the staircases. Install non-slip stair treads on all stairs in your home if they do not have carpet. Avoid having throw rugs at the top or bottom of stairs, or secure the rugs with carpet tape to prevent them from moving. Choose a carpet design that does not hide the edge of steps on the stairs. Make sure that carpet is firmly attached to the stairs. Fix any carpet that is  loose or worn. What can I do on the outside of my home? Use bright outdoor lighting. Repair the edges of walkways and driveways and fix any cracks. Clear paths of anything that can make you trip, such as tools or rocks. Add color or contrast paint or tape to clearly mark and help you see high doorway thresholds. Trim any bushes or trees on the main path into your home. Check that handrails are securely fastened and in good repair. Both sides of all steps should have handrails. Install guardrails along the edges of any raised decks or porches. Have leaves, snow, and ice cleared regularly. Use sand, salt, or ice melt on walkways during winter months if you live where there is ice and snow. In the garage, clean up any spills right away,  including grease or oil spills. What other actions can I take? Review your medicines with your health care provider. Some medicines can make you confused or feel dizzy. This can increase your chance of falling. Wear closed-toe shoes that fit well and support your feet. Wear shoes that have rubber soles and low heels. Use a cane, walker, scooter, or crutches that help you move around if needed. Talk with your provider about other ways that you can decrease your risk of falls. This may include seeing a physical therapist to learn to do exercises to improve movement and strength. Where to find more information Centers for Disease Control and Prevention, [employer]: tonerpromos.no [employer] on Aging: baseringtones.pl National Institute on Aging: baseringtones.pl Contact a health care provider if: You are afraid of falling at home. You feel weak, drowsy, or dizzy at home. You fall at home. Get help right away if you: Lose consciousness or have trouble moving after a fall. Have a fall that causes a head injury. These symptoms may be an emergency. Get help right away. Call 911. Do not wait to see if the symptoms will go away. Do not drive yourself to the hospital. This information is not intended to replace advice given to you by your health care provider. Make sure you discuss any questions you have with your health care provider. Document Revised: 01/02/2022 Document Reviewed: 01/02/2022 Elsevier Patient Education  2024 [employer].

## 2024-05-21 NOTE — Progress Notes (Signed)
 "  Subjective:    Patient ID: Cynthia [name], female    DOB: [birth year], 79 y.o.   MRN: [id number]  DOS:  05/21/2024 Follow-up  Multiple concerns In the last few months had 3 falls, described as mechanical, trip and fall, no major injury, no LOC, no neck pain.  In the last few months she is also feeling very sleepy throughout the day.  Sometimes fall asleep while sitting, reading and having a cup of coffee.  Emotionally is doing okay, although lately has been difficult as she is still missing her husband who died a few years ago.  Saw neurology, note reviewed.  Review of Systems See above   Past Medical History:  Diagnosis Date   Allergy 1969   Anemia    Anxiety    Arthritis    Blood transfusion without reported diagnosis 2012   Ulcer   Cataract    removed, 2020   Constipation, chronic    DEGENERATIVE JOINT DISEASE 06/02/2010   Depression    GERD (gastroesophageal reflux disease) 2021   sometimes, taking medication   Glaucoma 05/15/2012   Right eye   Headache(784.0)    Hyperlipidemia    hx of   Osteopenia    Osteoporosis    Psoriasis    PUD (peptic ulcer disease) 05/15/2010   Upper GI bleed d/t NSAIDs   RLS (restless legs syndrome)    robaxin  prn   TIA (transient ischemic attack) 09/2019    Past Surgical History:  Procedure Laterality Date   ANKLE FRACTURE SURGERY Left    surgery 12-11 (LEFT)   CESAREAN SECTION  05/16/1967   COLONOSCOPY  12/17/2013   EYE SURGERY  2020   cataract removal   FRACTURE SURGERY  2012   fracture in foot   JOINT REPLACEMENT  August, 2023   Left knee   TOTAL KNEE ARTHROPLASTY Left 12/28/2021   UPPER GASTROINTESTINAL ENDOSCOPY      Current Outpatient Medications  Medication Instructions   baclofen  (LIORESAL ) 10 mg, Oral, 3 times daily PRN   buPROPion  (WELLBUTRIN  XL) 300 mg, Oral, Daily   calcipotriene (DOVONOX) 0.005 % cream 1 Application, 2 times daily   cetirizine (ZYRTEC) 10 mg, Daily at bedtime   cholecalciferol   (VITAMIN D3) 400 Units, Daily   denosumab  (PROLIA ) 60 mg, Every 6 months   ezetimibe  (ZETIA ) 10 mg, Oral, Daily   fluticasone  (FLONASE ) 50 MCG/ACT nasal spray 2 sprays, Each Nare, Daily   gabapentin  (NEURONTIN ) 300 mg, 3 times daily   Multiple Vitamins-Minerals (ICAPS AREDS 2 PO) 1 capsule, Daily   nystatin  (MYCOSTATIN ) 500,000 Units, Oral, 4 times daily   omeprazole  (PRILOSEC) 40 MG capsule Oral, Daily   ondansetron  (ZOFRAN ) 4 mg, Oral, Every 8 hours PRN   propranolol  (INDERAL ) 10 mg, Oral, 3 times daily   sertraline  (ZOLOFT ) 150 mg, Daily at bedtime   traMADol  (ULTRAM ) 50 mg, Oral, Daily PRN   Travoprost, BAK Free, (TRAVATAN) 0.004 % SOLN ophthalmic solution 1 drop, Daily at bedtime   triamcinolone  cream (KENALOG ) 0.1 % 1 Application, Daily PRN   zolpidem  (AMBIEN ) 5 mg, Oral, At bedtime PRN       Objective:   Physical Exam BP 126/76   Pulse 86   Temp 98.2 F (36.8 C) (Oral)   Resp 18   Ht 5' 4 (1.626 m)   Wt 171 lb 6 oz (77.7 kg)   SpO2 96%   BMI 29.42 kg/m  General:   Well developed, NAD, BMI noted. HEENT:  Normocephalic . Face  symmetric, atraumatic Lungs:  CTA B Normal respiratory effort, no intercostal retractions, no accessory muscle use. Heart: RRR,  no murmur.  Lower extremities: no pretibial edema bilaterally  Skin: Not pale. Not jaundice Neurologic:  alert & oriented X3.  Speech normal, gait appropriate for age and unassisted Psych--  Cognition and judgment appear intact.  Cooperative with normal attention span and concentration.  Behavior appropriate. No anxious or depressed appearing.      Assessment     Assessment Hyperlipidemia: Lipitor causes myalgias, declined other statins Anxiety depression insomnia DJD Neuro: ---Migraines, chronic: Saw neurology 12/01/2020: Failed verapamil , nortriptyline  and topiramate . Emgality  helping.  Gabapentin  helps.  Tramadol  prn ---TIA: 2021 Admitted to the hospital with left-sided numbness, symptoms of  resolved, work-up included: 2D echo normal with grade 1 diastolic dysfunction.  No PFO or thrombus. MRA head no acute, 2 mm anterior communicating artery aneurysm. MRA neck normal LDL 162.  Sodium 133, slightly low.    Neurology notes: TIA versus complicated migraine, recommended aspirin  and Plavix  for 3 weeks, then aspirin . --- Tremors   Osteoporosis: 07-2014 T score of -1.7, T score (November 2021) -2.8: sports medicine Rx Prolia  PUD due to NSAIDs RLS - robaxin   prn Neuropathy: dx sports meds  Glaucoma Chronic constipation Skin psoriasis , Dx LSC (lichen )  ~ 2014 , sees derm   PLAN: Feeling sleepy: Reports she feels sleep very easily.  Medication list reviewed: Takes baclofen  as needed only, tramadol  very rarely, Ambien  very infrequently and she does not feel that she is having a hangover from it.  Also takes gabapentin , sertraline  and Tylenol  PM.  No ambulatory BPs Admits to some snoring, no history of OSA.Epworth scale: Scored 8, average Plan: Avoid baclofen , tramadol , Tylenol  PM and Ambien  for 2 weeks.  See if that makes a difference. Frequent falls: Described falls as mechanical, she does not think it is related to feeling sleepy, fortunately no major injury.  Last round of physical therapy  for back pain and stability ended in September 2025.  I suggested a cane and she declines. Plan: PT referral-declines.  Plans to do some yoga and continue staying active.  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 On sertraline , Wellbutrin  Migraines: Saw neurology 04/02/2024, Dx was chronic migraines, cervicogenic headache, they recommended to start Botox, stop sumatriptan , try Ubrelvy.  Limit the use of pain relievers Hyponatremia: Chronic, stable when checked in October Preventive care: PNM 20 today. RTC 4 months  "

## 2024-05-21 NOTE — Assessment & Plan Note (Signed)
 Feeling sleepy: Reports she feels sleep very easily.  Medication list reviewed: Takes baclofen  as needed only, tramadol  very rarely, Ambien  very infrequently and she does not feel that she is having a hangover from it.  Also takes gabapentin , sertraline  and Tylenol  PM.  No ambulatory BPs Admits to some snoring, no history of OSA.Epworth scale: Scored 8, average Plan: Avoid baclofen , tramadol , Tylenol  PM and Ambien  for 2 weeks.  See if that makes a difference. Frequent falls: Described falls as mechanical, she does not think it is related to feeling sleepy, fortunately no major injury.  Last round of physical therapy  for back pain and stability ended in September 2025.  I suggested a cane and she declines. Plan: PT referral-declines.  Plans to do some yoga and continue staying active.  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 On sertraline , Wellbutrin  Migraines: Saw neurology 04/02/2024, Dx was chronic migraines, cervicogenic headache, they recommended to start Botox, stop sumatriptan , try Ubrelvy.  Limit the use of pain relievers Hyponatremia: Chronic, stable when checked in October Preventive care: PNM 20 today. RTC 4 months

## 2024-05-30 ENCOUNTER — Other Ambulatory Visit (HOSPITAL_COMMUNITY): Payer: Self-pay

## 2024-05-30 ENCOUNTER — Other Ambulatory Visit: Payer: Self-pay | Admitting: Family Medicine

## 2024-06-02 ENCOUNTER — Other Ambulatory Visit (HOSPITAL_COMMUNITY): Payer: Self-pay

## 2024-06-05 ENCOUNTER — Telehealth: Admitting: Family Medicine

## 2024-06-05 DIAGNOSIS — B349 Viral infection, unspecified: Secondary | ICD-10-CM | POA: Diagnosis not present

## 2024-06-05 NOTE — Progress Notes (Signed)
 " Virtual Visit Consent   [name], you are scheduled for a virtual visit with a [facility] provider today. Just as with appointments in the office, your consent must be obtained to participate. Your consent will be active for this visit and any virtual visit you may have with one of our providers in the next 365 days. If you have a MyChart account, a copy of this consent can be sent to you electronically.  As this is a virtual visit, video technology does not allow for your provider to perform a traditional examination. This may limit your provider's ability to fully assess your condition. If your provider identifies any concerns that need to be evaluated in person or the need to arrange testing (such as labs, EKG, etc.), we will make arrangements to do so. Although advances in technology are sophisticated, we cannot ensure that it will always work on either your end or our end. If the connection with a video visit is poor, the visit may have to be switched to a telephone visit. With either a video or telephone visit, we are not always able to ensure that we have a secure connection.  By engaging in this virtual visit, you consent to the provision of healthcare and authorize for your insurance to be billed (if applicable) for the services provided during this visit. Depending on your insurance coverage, you may receive a charge related to this service.  I need to obtain your verbal consent now. Are you willing to proceed with your visit today? Cynthia [name] has provided verbal consent on 06/05/2024 for a virtual visit (video or telephone). Cynthia Lamp, FNP  Date: 06/05/2024 4:54 PM   Virtual Visit via Video Note   I, Cynthia [name], connected with  Cynthia [name]  ([id number], March 15, 1946) on 06/05/24 at  4:45 PM EST by a video-enabled telemedicine application and verified that I am speaking with the correct person using two identifiers.  Location: Patient: Virtual Visit Location Patient:  Home Provider: Virtual Visit Location Provider: Home Office   I discussed the limitations of evaluation and management by telemedicine and the availability of in person appointments. The patient expressed understanding and agreed to proceed.    History of Present Illness: Cynthia [name] is a 79 y.o. who identifies as a female who was assigned female at birth, and is being seen today for diarrhea, headache, ear pain, swollen glands in neck, sores in mouth, covid test neg, slight cough, sneezing, sx for 1.5 days. Body aches and fever. Lives in assisted living.   HPI: HPI  Problems:  Patient Active Problem List   Diagnosis Date Noted   Depression, major, single episode, moderate (HCC) 02/18/2024   Hyponatremia 02/01/2024   Sacroiliac joint dysfunction of both sides 08/23/2021   Stress reaction of bone 11/09/2020   Bone marrow edema 04/12/2020   Psoriasis 12/19/2019   TIA (transient ischemic attack) 09/14/2019   Insomnia 05/19/2019   Gastroesophageal reflux disease without esophagitis 05/02/2019   Essential tremor 07/25/2017   Lichen 07/24/2017   PCP NOTES >>>>>>>>>>>>>>>>>>>>>>>>>>>>>>>>>>> 03/25/2015   H/O adenomatous polyp of colon 05/18/2014   Annual physical exam 03/13/2011   Osteoporosis    Osteoarthritis 06/02/2010   Hyperlipidemia 11/09/2009   Migraine headache 06/28/2009   CONSTIPATION, CHRONIC 02/01/2007   Anxiety 08/17/2006   Allergic rhinitis 08/17/2006    Allergies: Allergies[1] Medications: Current Medications[2]  Observations/Objective: Patient is well-developed, well-nourished in no acute distress.  Resting comfortably  at home.  Head is normocephalic, atraumatic.  No labored breathing.  Speech is clear and coherent with logical content.  Patient is alert and oriented at baseline.    Assessment and Plan: 1. Viral illness (Primary)  Increase fluids, she is going to get a flu test for at home use and let us  know results. If positive we will send tamiflu .  Tylenol . Rest. See education.   Follow Up Instructions: I discussed the assessment and treatment plan with the patient. The patient was provided an opportunity to ask questions and all were answered. The patient agreed with the plan and demonstrated an understanding of the instructions.  A copy of instructions were sent to the patient via MyChart unless otherwise noted below.     The patient was advised to call back or seek an in-person evaluation if the symptoms worsen or if the condition fails to improve as anticipated.    [name], FNP     [1]  Allergies Allergen Reactions   Motrin  [Ibuprofen ]     GI bleed   Statins     delibitating pain all over body   Sulfonamide Derivatives     Pt unsure of reaction   Bee Venom Swelling and Rash   Ciprofloxacin Swelling and Rash  [2]  Current Outpatient Medications:    baclofen  (LIORESAL ) 10 MG tablet, Take 1 tablet (10 mg total) by mouth 3 (three) times daily as needed for muscle spasms., Disp: 90 each, Rfl: 5   buPROPion  (WELLBUTRIN  XL) 300 MG 24 hr tablet, Take 1 tablet (300 mg total) by mouth daily., Disp: 90 tablet, Rfl: 1   calcipotriene (DOVONOX) 0.005 % cream, Apply 1 Application topically 2 (two) times daily., Disp: , Rfl:    cetirizine (ZYRTEC) 10 MG tablet, Take 10 mg by mouth at bedtime., Disp: , Rfl:    cholecalciferol  (VITAMIN D3) 10 MCG (400 UNIT) TABS tablet, Take 400 Units by mouth daily., Disp: , Rfl:    denosumab  (PROLIA ) 60 MG/ML SOSY injection, Inject 60 mg into the skin every 6 (six) months., Disp: , Rfl:    ezetimibe  (ZETIA ) 10 MG tablet, Take 1 tablet (10 mg total) by mouth daily., Disp: 90 tablet, Rfl: 1   fluticasone  (FLONASE ) 50 MCG/ACT nasal spray, Place 2 sprays into both nostrils daily., Disp: 16 g, Rfl: 6   gabapentin  (NEURONTIN ) 300 MG capsule, Take 1 capsule (300 mg total) by mouth 3 (three) times daily., Disp: , Rfl:    Multiple Vitamins-Minerals (ICAPS AREDS 2 PO), Take 1 capsule by mouth daily., Disp:  , Rfl:    nystatin  (MYCOSTATIN ) 100000 UNIT/ML suspension, Take 5 mLs (500,000 Units total) by mouth 4 (four) times daily., Disp: 120 mL, Rfl: 1   omeprazole  (PRILOSEC) 40 MG capsule, TAKE 1 CAPSULE BY MOUTH EVERY DAY, Disp: 90 capsule, Rfl: 1   ondansetron  (ZOFRAN ) 4 MG tablet, Take 1 tablet (4 mg total) by mouth every 8 (eight) hours as needed for nausea or vomiting., Disp: 20 tablet, Rfl: 0   propranolol  (INDERAL ) 10 MG tablet, Take 1 tablet (10 mg total) by mouth 3 (three) times daily., Disp: 270 tablet, Rfl: 1   sertraline  (ZOLOFT ) 100 MG tablet, Take 1.5 tablets (150 mg total) by mouth at bedtime., Disp: , Rfl:    traMADol  (ULTRAM ) 50 MG tablet, Take 1 tablet (50 mg total) by mouth daily as needed., Disp: 30 tablet, Rfl: 0   Travoprost, BAK Free, (TRAVATAN) 0.004 % SOLN ophthalmic solution, Place 1 drop into the right eye at bedtime., Disp: , Rfl:    triamcinolone   cream (KENALOG ) 0.1 %, Apply 1 Application topically daily as needed (rash)., Disp: , Rfl:    zolpidem  (AMBIEN ) 5 MG tablet, TAKE 1 TABLET (5 MG TOTAL) BY MOUTH AT BEDTIME AS NEEDED FOR SLEEP., Disp: 30 tablet, Rfl: 2  "

## 2024-06-05 NOTE — Patient Instructions (Signed)

## 2024-06-19 ENCOUNTER — Telehealth: Payer: Self-pay | Admitting: Internal Medicine

## 2024-06-19 NOTE — Telephone Encounter (Signed)
 Requesting: Ambien  5mg   Contract:05/26/24 UDS: 02/19/24 Last Visit: 05/21/24 Next Visit:09/19/24 Last Refill: 11/28/23 #30 and 2RF   Please Advise

## 2024-06-19 NOTE — Telephone Encounter (Signed)
 PDMP okay, Rx sent

## 2024-07-14 ENCOUNTER — Ambulatory Visit

## 2024-08-19 ENCOUNTER — Ambulatory Visit

## 2024-09-19 ENCOUNTER — Ambulatory Visit: Admitting: Internal Medicine
